# Patient Record
Sex: Male | Born: 1940 | Race: White | Hispanic: No | Marital: Married | Smoking: Never smoker
Health system: Southern US, Community
[De-identification: ages and names within clinical notes are randomized; demographics above are authoritative.]

## PROBLEM LIST (undated history)

## (undated) DIAGNOSIS — T8859XA Other complications of anesthesia, initial encounter: Secondary | ICD-10-CM

## (undated) DIAGNOSIS — R350 Frequency of micturition: Secondary | ICD-10-CM

## (undated) DIAGNOSIS — Z8719 Personal history of other diseases of the digestive system: Secondary | ICD-10-CM

## (undated) DIAGNOSIS — R112 Nausea with vomiting, unspecified: Secondary | ICD-10-CM

## (undated) DIAGNOSIS — K219 Gastro-esophageal reflux disease without esophagitis: Secondary | ICD-10-CM

## (undated) DIAGNOSIS — J45909 Unspecified asthma, uncomplicated: Secondary | ICD-10-CM

## (undated) DIAGNOSIS — C61 Malignant neoplasm of prostate: Secondary | ICD-10-CM

## (undated) DIAGNOSIS — T4145XA Adverse effect of unspecified anesthetic, initial encounter: Secondary | ICD-10-CM

## (undated) DIAGNOSIS — K649 Unspecified hemorrhoids: Secondary | ICD-10-CM

## (undated) DIAGNOSIS — K4090 Unilateral inguinal hernia, without obstruction or gangrene, not specified as recurrent: Secondary | ICD-10-CM

## (undated) DIAGNOSIS — Z8711 Personal history of peptic ulcer disease: Secondary | ICD-10-CM

## (undated) DIAGNOSIS — Z87442 Personal history of urinary calculi: Secondary | ICD-10-CM

## (undated) DIAGNOSIS — Z85828 Personal history of other malignant neoplasm of skin: Secondary | ICD-10-CM

## (undated) DIAGNOSIS — E119 Type 2 diabetes mellitus without complications: Secondary | ICD-10-CM

## (undated) DIAGNOSIS — D649 Anemia, unspecified: Secondary | ICD-10-CM

## (undated) DIAGNOSIS — Z85528 Personal history of other malignant neoplasm of kidney: Secondary | ICD-10-CM

## (undated) DIAGNOSIS — I10 Essential (primary) hypertension: Secondary | ICD-10-CM

## (undated) DIAGNOSIS — E785 Hyperlipidemia, unspecified: Secondary | ICD-10-CM

## (undated) DIAGNOSIS — Z9289 Personal history of other medical treatment: Secondary | ICD-10-CM

## (undated) DIAGNOSIS — Z85038 Personal history of other malignant neoplasm of large intestine: Secondary | ICD-10-CM

## (undated) DIAGNOSIS — R011 Cardiac murmur, unspecified: Secondary | ICD-10-CM

## (undated) DIAGNOSIS — Z9889 Other specified postprocedural states: Secondary | ICD-10-CM

## (undated) DIAGNOSIS — I6529 Occlusion and stenosis of unspecified carotid artery: Secondary | ICD-10-CM

## (undated) HISTORY — DX: Other specified postprocedural states: Z98.890

## (undated) HISTORY — PX: TONSILLECTOMY: SUR1361

## (undated) HISTORY — DX: Occlusion and stenosis of unspecified carotid artery: I65.29

## (undated) HISTORY — DX: Unilateral inguinal hernia, without obstruction or gangrene, not specified as recurrent: K40.90

## (undated) HISTORY — PX: CYSTOSCOPY: SUR368

## (undated) HISTORY — DX: Personal history of other diseases of the digestive system: Z87.19

## (undated) HISTORY — PX: THROAT SURGERY: SHX803

---

## 2003-08-28 HISTORY — PX: PARTIAL COLECTOMY: SHX5273

## 2004-02-09 ENCOUNTER — Encounter (INDEPENDENT_AMBULATORY_CARE_PROVIDER_SITE_OTHER): Payer: Self-pay | Admitting: Specialist

## 2004-02-09 ENCOUNTER — Ambulatory Visit (HOSPITAL_COMMUNITY): Admission: RE | Admit: 2004-02-09 | Discharge: 2004-02-09 | Payer: Self-pay | Admitting: Gastroenterology

## 2004-05-17 ENCOUNTER — Encounter (INDEPENDENT_AMBULATORY_CARE_PROVIDER_SITE_OTHER): Payer: Self-pay | Admitting: *Deleted

## 2004-05-17 ENCOUNTER — Inpatient Hospital Stay (HOSPITAL_COMMUNITY): Admission: RE | Admit: 2004-05-17 | Discharge: 2004-05-24 | Payer: Self-pay | Admitting: Surgery

## 2004-06-02 ENCOUNTER — Encounter: Admission: RE | Admit: 2004-06-02 | Discharge: 2004-06-02 | Payer: Self-pay | Admitting: Surgery

## 2005-06-08 ENCOUNTER — Encounter: Admission: RE | Admit: 2005-06-08 | Discharge: 2005-06-08 | Payer: Self-pay | Admitting: Unknown Physician Specialty

## 2005-06-22 ENCOUNTER — Encounter: Admission: RE | Admit: 2005-06-22 | Discharge: 2005-06-22 | Payer: Self-pay | Admitting: Unknown Physician Specialty

## 2006-01-03 IMAGING — CR DG ABDOMEN 2V
2 series · 2 of 2 positions shown · non-contrast
Comparison: none

CLINICAL DATA: Follow-up hemicolectomy.
 ABDOMEN TWO VIEWS
 The bowel gas pattern is normal.  There is no evidence for free peritoneal air.  Skin staples are seen in the transverse orientation overlying the right abdomen and there are surgical staple lines seen within the right abdomen.
 IMPRESSION
 Normal bowel gas pattern.

[view not recorded (1 of 2)]
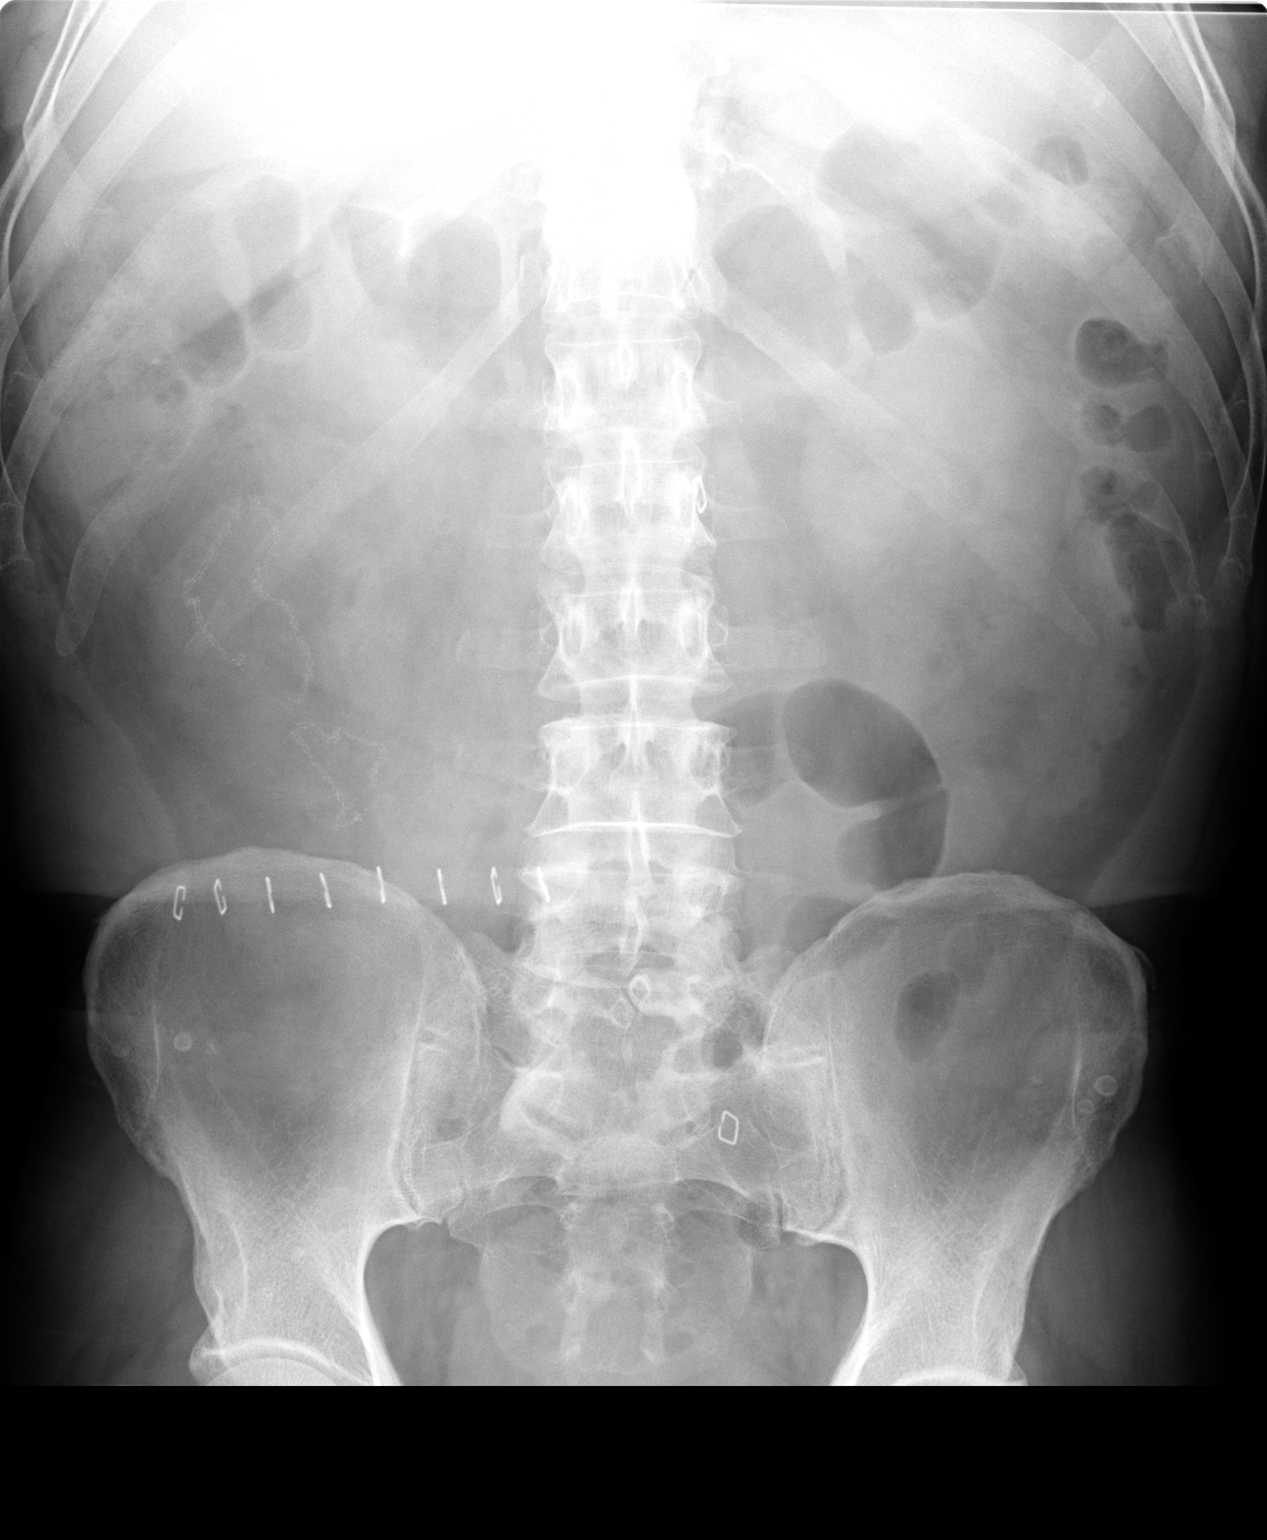

[view not recorded (2 of 2)]
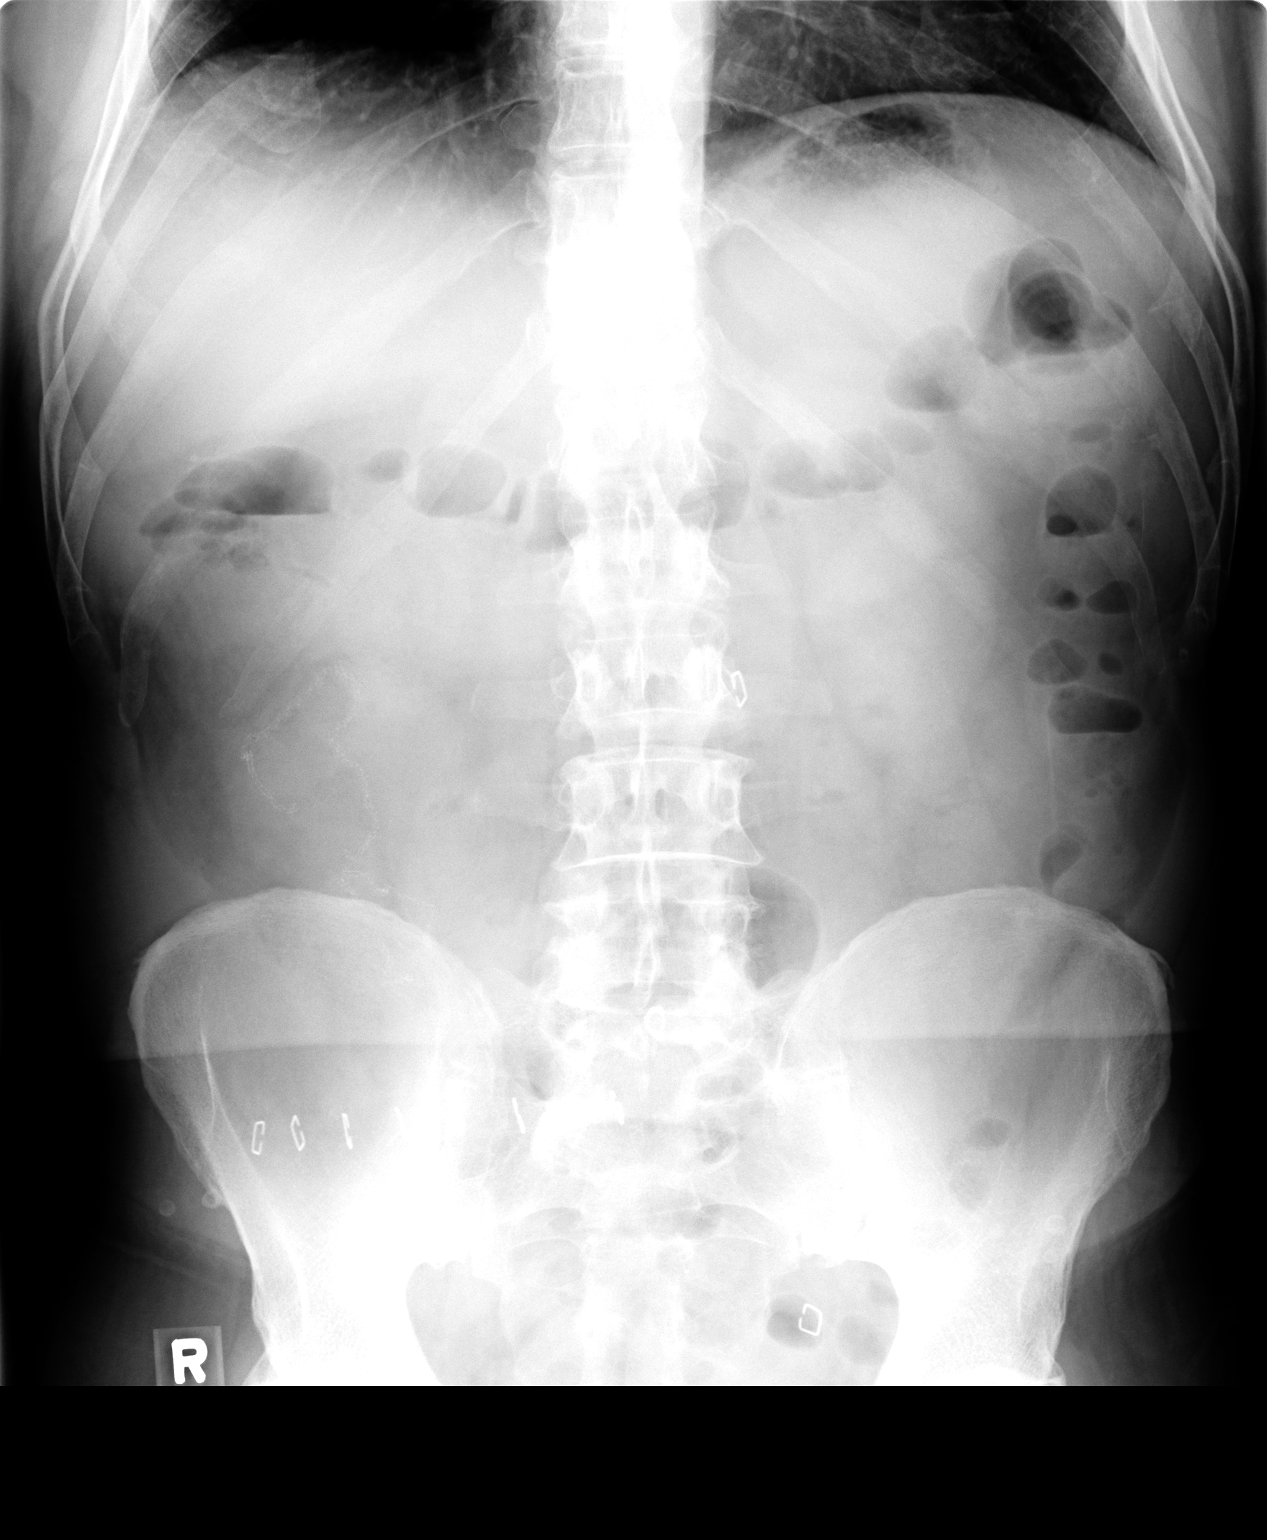

[2 of 2 positions shown; findings below may reference images not displayed]

## 2007-08-28 HISTORY — PX: OTHER SURGICAL HISTORY: SHX169

## 2009-09-19 ENCOUNTER — Inpatient Hospital Stay (HOSPITAL_COMMUNITY): Admission: AD | Admit: 2009-09-19 | Discharge: 2009-09-20 | Payer: Self-pay | Admitting: Gastroenterology

## 2010-08-27 HISTORY — PX: KNEE SURGERY: SHX244

## 2010-09-16 ENCOUNTER — Encounter: Payer: Self-pay | Admitting: Unknown Physician Specialty

## 2010-11-12 LAB — TYPE AND SCREEN
ABO/RH(D): O POS
Antibody Screen: NEGATIVE

## 2010-11-12 LAB — COMPREHENSIVE METABOLIC PANEL
ALT: 28 U/L (ref 0–53)
AST: 30 U/L (ref 0–37)
Albumin: 4 g/dL (ref 3.5–5.2)
Alkaline Phosphatase: 53 U/L (ref 39–117)
BUN: 19 mg/dL (ref 6–23)
CO2: 29 mEq/L (ref 19–32)
Calcium: 9.2 mg/dL (ref 8.4–10.5)
Chloride: 103 mEq/L (ref 96–112)
Creatinine, Ser: 0.74 mg/dL (ref 0.4–1.5)
GFR calc Af Amer: >60 mL/min (ref >60)
GFR calc non Af Amer: >60 mL/min (ref >60)
Glucose, Bld: 91 mg/dL (ref 70–99)
Potassium: 3.8 mEq/L (ref 3.5–5.1)
Sodium: 139 mEq/L (ref 135–145)
Total Bilirubin: 1.2 mg/dL (ref 0.3–1.2)
Total Protein: 6.1 g/dL (ref 6.0–8.3)

## 2010-11-12 LAB — CBC
HCT: 37.6 % — ABNORMAL LOW (ref 39.0–52.0)
HCT: 41.6 % (ref 39.0–52.0)
Hemoglobin: 12.5 g/dL — ABNORMAL LOW (ref 13.0–17.0)
Hemoglobin: 14.1 g/dL (ref 13.0–17.0)
MCHC: 33.2 g/dL (ref 30.0–36.0)
MCHC: 34 g/dL (ref 30.0–36.0)
MCV: 85.1 fL (ref 78.0–100.0)
MCV: 86.4 fL (ref 78.0–100.0)
Platelets: 151 10*3/uL (ref 150–400)
Platelets: 176 10*3/uL (ref 150–400)
RBC: 4.35 MIL/uL (ref 4.22–5.81)
RBC: 4.89 MIL/uL (ref 4.22–5.81)
RDW: 13.2 % (ref 11.5–15.5)
RDW: 13.4 % (ref 11.5–15.5)
WBC: 6.2 10*3/uL (ref 4.0–10.5)
WBC: 7.7 10*3/uL (ref 4.0–10.5)

## 2010-11-12 LAB — DIFFERENTIAL
Basophils Absolute: 0 10*3/uL (ref 0.0–0.1)
Basophils Relative: 1 % (ref 0–1)
Eosinophils Absolute: 0.4 10*3/uL (ref 0.0–0.7)
Eosinophils Relative: 5 % (ref 0–5)
Lymphocytes Relative: 25 % (ref 12–46)
Lymphs Abs: 1.9 10*3/uL (ref 0.7–4.0)
Monocytes Absolute: 0.7 10*3/uL (ref 0.1–1.0)
Monocytes Relative: 10 % (ref 3–12)
Neutro Abs: 4.5 10*3/uL (ref 1.7–7.7)
Neutrophils Relative %: 59 % (ref 43–77)

## 2010-11-12 LAB — PROTIME-INR
INR: 0.96 (ref 0.00–1.49)
Prothrombin Time: 12.7 seconds (ref 11.6–15.2)

## 2010-11-12 LAB — BASIC METABOLIC PANEL
BUN: 19 mg/dL (ref 6–23)
CO2: 28 mEq/L (ref 19–32)
Calcium: 8.4 mg/dL (ref 8.4–10.5)
Chloride: 106 mEq/L (ref 96–112)
Creatinine, Ser: 0.94 mg/dL (ref 0.4–1.5)
GFR calc Af Amer: >60 mL/min (ref >60)
GFR calc non Af Amer: >60 mL/min (ref >60)
Glucose, Bld: 114 mg/dL — ABNORMAL HIGH (ref 70–99)
Potassium: 3.8 mEq/L (ref 3.5–5.1)
Sodium: 139 mEq/L (ref 135–145)

## 2010-11-12 LAB — CLOTEST (H. PYLORI), BIOPSY: Helicobacter screen: POSITIVE — AB

## 2010-11-12 LAB — ABO/RH: ABO/RH(D): O POS

## 2011-01-12 NOTE — Op Note (Signed)
NAME:  Bruce Nunez, Bruce Nunez                           ACCOUNT NO.:  [id number]   MEDICAL RECORD NO.:  [id number]                   PATIENT TYPE:  AMB   LOCATION:  ENDO                                 FACILITY:  [facility]   PHYSICIAN:  Bernette Redbird, M.D.                DATE OF BIRTH:  10-Feb-1941   DATE OF PROCEDURE:  02/09/2004  DATE OF DISCHARGE:                                 OPERATIVE REPORT   PROCEDURE:  Colonoscopy with polypectomy and biopsy.   INDICATIONS FOR PROCEDURE:  A 70 year old for screening.  Flexible  sigmoidoscopy negative five years ago.  No worrisome risk factors or  symptoms.   FINDINGS:  Multiple polyps removed.  Medium size lesion attached to lip of  ileocecal valve, biopsied, but not removed.   DESCRIPTION OF PROCEDURE:  The nature, purpose, and risks of the procedure  have been discussed with the patient who provided written consent.  Sedation  was fentanyl 50 mcg and Versed 5 mg IV without arrhythmias or desaturation.  Digital examination of the prostate was unremarkable.  The Olympus  adjustable tension pediatric video colonoscope was advanced to the cecum  without too much difficulty.  On the way in, I encountered multiple polyps,  the largest of them being a roughly 1.5 cm soft, fleshy, erythematous polyp  literally attached to and integrated with the inferior lip of the ileocecal  valve.  I did not actually intubate the terminal ileum.  This lesion was  soft and fleshy.  It did not have a discrete margin.  I obtained several  cold biopsies, but did not feel that it was amenable to colonoscopic  extrication due to its juxtaposition to the ileocecal valve.   In the proximal ascending colon were at least three additional polyps, two  of which were cold snared and one of which was cold biopsied.  In the rectum  were two additional polyps removed by cold biopsy and in the descending  colon was a small polyp removed by cold snare.  All of the polyps except the  one  applied to the ileocecal were on the order of 4 mm or so in size and all  could be suctioned through the scope, except the ones which were cold  biopsied which were more like 2 or 3 mm in size.   Apart from the polyp at the ileocecal valve, it is felt that all polyp  tissue was removed.  However, no cautery was used in any location.  The  patient tolerated the procedure well and there were no apparent  complications.  Retroflexion in the rectum and reinspection of the rectum  was otherwise unremarkable.  There was no evidence of frank cancer, colitis,  vascular malformations, or diverticulosis.   The patient tolerated the procedure well and there were no apparent  complications.   IMPRESSION:  1. Multiple colon polyps removed as described above (211.3).  2. Rectal  polyps removed as described above as well.   PLAN:  Await pathology results.  Most likely, the patient will need surgical  resection of the lesion at the ileocecal valve.                                               Bernette Redbird, M.D.    RB/MEDQ  D:  02/09/2004  T:  02/09/2004  Job:  [id number]   cc:   [name]  [address]  [place]  [place] [address]  Fax: [phone]

## 2011-01-12 NOTE — Op Note (Signed)
NAMEDAVIUS, GOUDEAU NO.:  [id number]   MEDICAL RECORD NO.:  [id number]          PATIENT TYPE:  INP   LOCATION:  0380                         FACILITY:  [facility]   PHYSICIAN:  Thornton Park. Daphine Deutscher, M.D.DATE OF BIRTH:  1940/10/14   DATE OF PROCEDURE:  05/17/2004  DATE OF DISCHARGE:                                 OPERATIVE REPORT   PREOPERATIVE DIAGNOSIS:  Villous adenoma of the ileocecal valve.   POSTOPERATIVE DIAGNOSIS:  Villous adenoma of the ileocecal valve, permanent  sections pending.   PROCEDURE:  Laparoscopic assisted right hemicolectomy.   SURGEON:  Thornton Park. Daphine Deutscher, M.D.   ASSISTANT:  Currie Paris, M.D.   ANESTHESIA:  General endotracheal anesthesia.   OPERATIVE FINDINGS:  1.  Right inguinal hernia diagnosed laparoscopically.  2.  Villous tumor at the ileocecal valve examined grossly and sent for      permanent sections.   DESCRIPTION OF PROCEDURE:  Mr. Petrucelli was taken to room 1 and given general  anesthesia.  He did receive Mefoxin as well as heparin and PAS hose preop.  The abdomen was prepped with Betadine and draped sterilely.  The abdomen was  entered first using an Optiview technique making a small 1 cm incision above  the umbilicus slightly to the right of midline and inserting the Optiview  without difficulty.  The abdomen was then insufflated and two 5 mm trocars  were placed to the left of midline, one inferiorly and one superiorly in the  abdomen.  Through these, I began mobilization of the right colon starting  down near the terminal ileum.  At that point, I did recognize a right  inguinal hernia.  No left inguinal hernia was seen.  I also looked at the  liver which looked normal in the stomach.  The Harmonic scalpel was used to  mobilize the right colon including the ileocecal region, the ascending  colon, and the hepatic flexure.  When this was complete, it appeared  hemostasis had been achieved, again, using the Harmonic.  I  then made about  a 9 cm incision incorporating the trocar site just above the umbilicus and  used the plastic ring to protect the wound and to retract.  The terminal  ileum and right colon were exteriorized.  The terminal ileum was divided  with the GIA and likewise, a suitable area probably in the proximal  transverse colon was divided and the mesentery was divided with a  combination of the Harmonic scalpel with clamps and 2-0 silk ties.  The  terminal ileum was then aligned along the colon and these were attached with  a suture.  Antimesenteric openings were made with the Harmonic and the 75  GIA was inserted.  Once these were aligned, they were fired after holding  compression and the combined defect was examined and looked good.  No  bleeding was noted.  The combined opening was closed from either end, first  placing silk sutures at either staple line edge, closing the inner layer  with 4-0 PDS and reinforcing this closure with interrupted 3-0 silks.  The  mesentery was closed with interrupted 3-0 and 2-0 silks and returned to the  abdomen.  We changed our gloves and removed the wound protector.  The area  was irrigated.  No active bleeding was noted.  The small bowel was run and  seemed to be in good alignment.   The wound was closed in layers, first with a #1 PDS in the posterior sheath  and then the anterior sheath was closed from laterally and medially and tied  in the middle with #1 PDS.  The wound was injected with 0.5% Marcaine, was  irrigated again, and closed with staples as were the two small 5 mm trocar  sites.  The patient tolerated the procedure well and was taken to the  recovery room in satisfactory condition.      MBM/MEDQ  D:  05/17/2004  T:  05/17/2004  Job:  [id number]   cc:   [name]  [address]  [place]  [place] [address]  Fax: [phone]   Bernette Redbird, M.D.  [address] [place]  [place], [place] [address]  Fax: [phone]

## 2011-01-12 NOTE — Discharge Summary (Signed)
[name], [name] NO.:  [id number]   MEDICAL RECORD NO.:  [id number]          PATIENT TYPE:  INP   LOCATION:  0380                         FACILITY:  [facility]   PHYSICIAN:  Thornton Park. Daphine Deutscher, M.D.DATE OF BIRTH:  1940-11-03   DATE OF ADMISSION:  05/17/2004  DATE OF DISCHARGE:  05/24/2004                                 DISCHARGE SUMMARY   PROCEDURE:  May 17, 2004, laparoscopically assisted right  hemicolectomy.   Pathology report showed this to be a villous adenoma, 2.6 cm tubulovillous  adenoma of the ileocecal valve with no evidence of high-grade dysplasia or  malignancy.   COURSE IN THE HOSPITAL:  Bruce Nunez underwent a laparoscopic-assisted right  hemicolectomy on May 17, 2004.  He did very well.  He had minimal pain  but had a colonic ileus, which kind of slowed his ultimate progression to a  regular diet.  Finally, by postop day #7, he was passing gas and having  bowel movements.  He was discharged with Tylox for pain and Phenergan if he  got nauseated.  His condition was good.  He was asked to return to the  office in three weeks.      MBM/MEDQ  D:  05/24/2004  T:  05/24/2004  Job:  [id number]   cc:   [name]  [address]  [place]  [place] [address]  Fax: [phone]   Bernette Redbird, M.D.  [address] [place]  [place], [place] [address]  Fax: [phone]

## 2011-10-10 DIAGNOSIS — K921 Melena: Secondary | ICD-10-CM | POA: Diagnosis not present

## 2011-10-12 DIAGNOSIS — L570 Actinic keratosis: Secondary | ICD-10-CM | POA: Diagnosis not present

## 2011-10-12 DIAGNOSIS — L821 Other seborrheic keratosis: Secondary | ICD-10-CM | POA: Diagnosis not present

## 2011-10-12 DIAGNOSIS — D1801 Hemangioma of skin and subcutaneous tissue: Secondary | ICD-10-CM | POA: Diagnosis not present

## 2011-10-17 DIAGNOSIS — K625 Hemorrhage of anus and rectum: Secondary | ICD-10-CM | POA: Diagnosis not present

## 2011-10-17 DIAGNOSIS — J45909 Unspecified asthma, uncomplicated: Secondary | ICD-10-CM | POA: Diagnosis not present

## 2011-10-17 DIAGNOSIS — E785 Hyperlipidemia, unspecified: Secondary | ICD-10-CM | POA: Diagnosis not present

## 2011-10-17 DIAGNOSIS — J45902 Unspecified asthma with status asthmaticus: Secondary | ICD-10-CM | POA: Diagnosis not present

## 2011-10-17 DIAGNOSIS — I10 Essential (primary) hypertension: Secondary | ICD-10-CM | POA: Diagnosis not present

## 2011-10-17 DIAGNOSIS — D500 Iron deficiency anemia secondary to blood loss (chronic): Secondary | ICD-10-CM | POA: Diagnosis not present

## 2011-11-12 DIAGNOSIS — D500 Iron deficiency anemia secondary to blood loss (chronic): Secondary | ICD-10-CM | POA: Diagnosis not present

## 2011-11-12 DIAGNOSIS — J180 Bronchopneumonia, unspecified organism: Secondary | ICD-10-CM | POA: Diagnosis not present

## 2011-12-04 ENCOUNTER — Other Ambulatory Visit: Payer: Self-pay | Admitting: Gastroenterology

## 2011-12-04 DIAGNOSIS — R195 Other fecal abnormalities: Secondary | ICD-10-CM | POA: Diagnosis not present

## 2011-12-04 DIAGNOSIS — D128 Benign neoplasm of rectum: Secondary | ICD-10-CM | POA: Diagnosis not present

## 2011-12-04 DIAGNOSIS — K5730 Diverticulosis of large intestine without perforation or abscess without bleeding: Secondary | ICD-10-CM | POA: Diagnosis not present

## 2011-12-04 DIAGNOSIS — D509 Iron deficiency anemia, unspecified: Secondary | ICD-10-CM | POA: Diagnosis not present

## 2011-12-04 DIAGNOSIS — D129 Benign neoplasm of anus and anal canal: Secondary | ICD-10-CM | POA: Diagnosis not present

## 2011-12-04 DIAGNOSIS — D126 Benign neoplasm of colon, unspecified: Secondary | ICD-10-CM | POA: Diagnosis not present

## 2011-12-04 DIAGNOSIS — D1330 Benign neoplasm of unspecified part of small intestine: Secondary | ICD-10-CM | POA: Diagnosis not present

## 2012-01-11 DIAGNOSIS — L981 Factitial dermatitis: Secondary | ICD-10-CM | POA: Diagnosis not present

## 2012-01-11 DIAGNOSIS — L820 Inflamed seborrheic keratosis: Secondary | ICD-10-CM | POA: Diagnosis not present

## 2012-01-11 DIAGNOSIS — L570 Actinic keratosis: Secondary | ICD-10-CM | POA: Diagnosis not present

## 2012-03-17 DIAGNOSIS — D509 Iron deficiency anemia, unspecified: Secondary | ICD-10-CM | POA: Diagnosis not present

## 2012-04-17 DIAGNOSIS — J209 Acute bronchitis, unspecified: Secondary | ICD-10-CM | POA: Diagnosis not present

## 2012-05-05 DIAGNOSIS — R079 Chest pain, unspecified: Secondary | ICD-10-CM | POA: Diagnosis not present

## 2012-05-05 DIAGNOSIS — S20219A Contusion of unspecified front wall of thorax, initial encounter: Secondary | ICD-10-CM | POA: Diagnosis not present

## 2012-05-12 DIAGNOSIS — E785 Hyperlipidemia, unspecified: Secondary | ICD-10-CM | POA: Diagnosis not present

## 2012-05-12 DIAGNOSIS — I10 Essential (primary) hypertension: Secondary | ICD-10-CM | POA: Diagnosis not present

## 2012-05-12 DIAGNOSIS — Z6826 Body mass index (BMI) 26.0-26.9, adult: Secondary | ICD-10-CM | POA: Diagnosis not present

## 2012-05-12 DIAGNOSIS — Z125 Encounter for screening for malignant neoplasm of prostate: Secondary | ICD-10-CM | POA: Diagnosis not present

## 2012-06-04 DIAGNOSIS — R972 Elevated prostate specific antigen [PSA]: Secondary | ICD-10-CM | POA: Diagnosis not present

## 2012-06-25 DIAGNOSIS — R972 Elevated prostate specific antigen [PSA]: Secondary | ICD-10-CM | POA: Diagnosis not present

## 2012-06-25 DIAGNOSIS — Z6831 Body mass index (BMI) 31.0-31.9, adult: Secondary | ICD-10-CM | POA: Diagnosis not present

## 2012-06-26 DIAGNOSIS — H43399 Other vitreous opacities, unspecified eye: Secondary | ICD-10-CM | POA: Diagnosis not present

## 2012-06-26 DIAGNOSIS — H179 Unspecified corneal scar and opacity: Secondary | ICD-10-CM | POA: Diagnosis not present

## 2012-06-26 DIAGNOSIS — H04129 Dry eye syndrome of unspecified lacrimal gland: Secondary | ICD-10-CM | POA: Diagnosis not present

## 2012-06-26 DIAGNOSIS — Z961 Presence of intraocular lens: Secondary | ICD-10-CM | POA: Diagnosis not present

## 2012-07-01 DIAGNOSIS — N200 Calculus of kidney: Secondary | ICD-10-CM | POA: Diagnosis not present

## 2012-07-01 DIAGNOSIS — R972 Elevated prostate specific antigen [PSA]: Secondary | ICD-10-CM | POA: Diagnosis not present

## 2012-07-01 DIAGNOSIS — N401 Enlarged prostate with lower urinary tract symptoms: Secondary | ICD-10-CM | POA: Diagnosis not present

## 2012-08-11 DIAGNOSIS — I10 Essential (primary) hypertension: Secondary | ICD-10-CM | POA: Diagnosis not present

## 2012-08-11 DIAGNOSIS — J45909 Unspecified asthma, uncomplicated: Secondary | ICD-10-CM | POA: Diagnosis not present

## 2012-08-11 DIAGNOSIS — E785 Hyperlipidemia, unspecified: Secondary | ICD-10-CM | POA: Diagnosis not present

## 2012-08-15 DIAGNOSIS — IMO0002 Reserved for concepts with insufficient information to code with codable children: Secondary | ICD-10-CM | POA: Diagnosis not present

## 2012-08-15 DIAGNOSIS — C61 Malignant neoplasm of prostate: Secondary | ICD-10-CM | POA: Insufficient documentation

## 2012-08-15 DIAGNOSIS — R972 Elevated prostate specific antigen [PSA]: Secondary | ICD-10-CM | POA: Diagnosis not present

## 2012-08-15 HISTORY — DX: Malignant neoplasm of prostate: C61

## 2012-08-15 HISTORY — PX: PROSTATE BIOPSY: SHX241

## 2012-10-08 DIAGNOSIS — J0100 Acute maxillary sinusitis, unspecified: Secondary | ICD-10-CM | POA: Diagnosis not present

## 2012-10-14 ENCOUNTER — Encounter: Payer: Self-pay | Admitting: Radiation Oncology

## 2012-10-15 ENCOUNTER — Ambulatory Visit
Admission: RE | Admit: 2012-10-15 | Discharge: 2012-10-15 | Disposition: A | Discharge status: Home / Self Care | Payer: Medicare Other | Source: Ambulatory Visit | Attending: Radiation Oncology | Admitting: Radiation Oncology

## 2012-10-15 ENCOUNTER — Encounter: Payer: Self-pay | Admitting: Radiation Oncology

## 2012-10-15 VITALS — BP 172/61 | HR 77 | Temp 98.3°F | Wt 168.1 lb

## 2012-10-15 DIAGNOSIS — E785 Hyperlipidemia, unspecified: Secondary | ICD-10-CM | POA: Insufficient documentation

## 2012-10-15 DIAGNOSIS — I10 Essential (primary) hypertension: Secondary | ICD-10-CM | POA: Insufficient documentation

## 2012-10-15 DIAGNOSIS — J45909 Unspecified asthma, uncomplicated: Secondary | ICD-10-CM | POA: Diagnosis not present

## 2012-10-15 DIAGNOSIS — C61 Malignant neoplasm of prostate: Secondary | ICD-10-CM | POA: Insufficient documentation

## 2012-10-15 DIAGNOSIS — Z79899 Other long term (current) drug therapy: Secondary | ICD-10-CM | POA: Insufficient documentation

## 2012-10-15 HISTORY — DX: Unspecified hemorrhoids: K64.9

## 2012-10-15 HISTORY — DX: Anemia, unspecified: D64.9

## 2012-10-15 HISTORY — DX: Unspecified asthma, uncomplicated: J45.909

## 2012-10-15 HISTORY — DX: Hyperlipidemia, unspecified: E78.5

## 2012-10-15 HISTORY — DX: Essential (primary) hypertension: I10

## 2012-10-15 HISTORY — DX: Malignant neoplasm of prostate: C61

## 2012-10-15 NOTE — Progress Notes (Signed)
Please see the Nurse Progress Note in the MD Initial Consult Encounter for this patient. 

## 2012-10-15 NOTE — Progress Notes (Signed)
   Complex simulation/treatment planning note: The patient underwent complex simulation/treatment planning in preparation of his prostate seed implant. He was taken to the CT simulator. His pelvis was scanned. I contoured his prostate and projected dose over the bony arch. His gland volume is 34.1 cc, whereas Dr. Isabel Caprice measured 30 cc. Prostatic length is 3.6 cm. The arch is open. He is a candidate for seed implantation. I prescribing 14,500 cGy utilizing I-125 seeds. He is to be implanted with [employer] system.

## 2012-10-15 NOTE — Progress Notes (Signed)
Patient and wife here for radiation consultation of newly diagnosed prostate cancer. PSA 5.0 on 06/26/12 PSA 4.6 on 06/05/12 Gleason 3+4-7 Prostate volume 30 g. (Will call with medication he had reaction to when he gets home)

## 2012-10-15 NOTE — Progress Notes (Signed)
[facility] Radiation Oncology NEW PATIENT EVALUATION  Name: Bruce Nunez MRN: [id number]  Date:   10/15/2012           DOB: 17-Mar-1941  Status: outpatient   CC:   Valetta Fuller, MD,  Dr. [name]   REFERRING PHYSICIAN: Valetta Fuller, MD   DIAGNOSIS: The encounter diagnosis was Prostate cancer.    HISTORY OF PRESENT ILLNESS:  Bruce Nunez is a 72 y.o. male who is seen today for the courtesy of Dr. Barron Alvine for consideration of radiation therapy in the management of his stage TI C. intermediate risk adenocarcinoma prostate. His then have an elevated PSA in early October 2013 of 4.6. After a course of antibiotics his PSA rose to 5.8. He was seen by Dr. Isabel Caprice who performed ultrasound-guided biopsies on 08/15/2012. His then have Gleason 7 (3+4) involving 10% of one core from the right base and Gleason 6 (3+3) involving 40% of one core from right lateral mid gland, 30% of one core from the right mid gland, 70% of one core from the right lateral apex and 50% of one core from the right apex. His gland volume was approximately 30 cc. He is doing well from a GU and GI standpoint. His I PSS score is 5. He claims to be potent, but not sexually active.  PREVIOUS RADIATION THERAPY: No   PAST MEDICAL HISTORY:  has a past medical history of Prostate cancer (08/15/12); Hypertension; Asthma; Reflux esophagitis; Hemorrhoids; Hyperlipidemia; Anemia; and Kidney stones.     PAST SURGICAL HISTORY:  Past Surgical History  Procedure Laterality Date  . Prostate biopsy  08/15/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  . Cataract surgery  2009  . Knee surgery  2012    right  . Partial colectomy  2005  . Throat surgery      as child (age 31)  . Tonsillectomy      age 38  . Cystoscopy       FAMILY HISTORY: family history includes Brain cancer in an unspecified family member; Coronary artery disease in an unspecified family member; Heart attack in an unspecified family member; and Prostate cancer in an  unspecified family member. His father died from congestive heart failure at 50. He was diagnosed with prostate cancer at age 5. His mother died of a brain malignancy at age 53. He had 2 first cousins with prostate cancer.   SOCIAL HISTORY:  reports that he has never smoked. He has never used smokeless tobacco. He reports that he does not drink alcohol or use illicit drugs. Married, 2 children. Retired from [employer] for a [employer]   ALLERGIES: Aspirin; Penicillins; Sulfa antibiotics; and Tramadol   MEDICATIONS:  Current Outpatient Prescriptions  Medication Sig Dispense Refill  . albuterol (PROVENTIL HFA;VENTOLIN HFA) 108 (90 BASE) MCG/ACT inhaler Inhale 2 puffs into the lungs every 6 (six) hours as needed for wheezing.      Marland Kitchen amLODipine (NORVASC) 10 MG tablet Take 10 mg by mouth daily.      . bisoprolol-hydrochlorothiazide (ZIAC) 10-6.25 MG per tablet Take 1 tablet by mouth daily.      . ceftibuten (CEDAX) 400 MG capsule Take 400 mg by mouth daily. Anti-biotic for sinus infection      . fexofenadine (ALLEGRA) 180 MG tablet Take 180 mg by mouth daily.      . fluticasone-salmeterol (ADVAIR HFA) 115-21 MCG/ACT inhaler Inhale 2 puffs into the lungs 2 (two) times daily.      Marland Kitchen  hydrocortisone (ANUSOL-HC) 25 MG suppository Place 25 mg rectally as needed for hemorrhoids.      Marland Kitchen losartan (COZAAR) 100 MG tablet Take 100 mg by mouth daily.      . mometasone (NASONEX) 50 MCG/ACT nasal spray Place 2 sprays into the nose as needed.      . Multiple Vitamin (MULTIVITAMIN) tablet Take 1 tablet by mouth daily.      . ondansetron (ZOFRAN) 4 MG tablet Take 4 mg by mouth every 8 (eight) hours as needed for nausea.      . Tamsulosin HCl (FLOMAX) 0.4 MG CAPS Take 0.4 mg by mouth daily.      . ferrous sulfate 325 (65 FE) MG tablet Take 325 mg by mouth daily with breakfast.       No current facility-administered medications for this encounter.     REVIEW OF SYSTEMS:  Pertinent items are noted in  HPI.    PHYSICAL EXAM:  weight is 168 lb 1.6 oz (76.25 kg). His temperature is 98.3 F (36.8 C). His blood pressure is 172/61 and his pulse is 77.   Alert and oriented. Head and neck examination: Grossly unremarkable. Nodes: Without palpable cervical or supraclavicular lymphadenopathy. Chest: Lungs clear. Heart: Regular in rhythm. Back: Without spinal or CVA tenderness. Abdomen: Right mid abdominal scar from previous colectomy. Without masses organomegaly. Genitalia unremarkable to inspection. Rectal: The prostate gland is normal in size with a suggestion of induration along the right mid gland. No nodularity. Extremities without edema. Neurologic examination: Grossly nonfocal.   LABORATORY DATA:  Lab Results  Component Value Date   WBC 6.2 09/20/2009   HGB 12.5* 09/20/2009   HCT 37.6* 09/20/2009   MCV 86.4 09/20/2009   PLT 151 09/20/2009   Lab Results  Component Value Date   NA 139 09/20/2009   K 3.8 09/20/2009   CL 106 09/20/2009   CO2 28 09/20/2009   Lab Results  Component Value Date   ALT 28 09/19/2009   AST 30 09/19/2009   ALKPHOS 53 09/19/2009   BILITOT 1.2 09/19/2009   PSA 5.8 from 06/25/2012.   IMPRESSION: Stage TI C. intermediate risk adenocarcinoma prostate. I explained to the patient and his wife that his prognosis is related to his stage, PSA level, and Gleason score. His stage and PSA level are favorable while his Gleason score 7 is of intermediate favorability. We discussed surgery versus close surveillance versus radiation therapy. His radiation therapy options include seed implantation alone or 8 weeks of external beam/IMRT. Since he only has low volume Gleason 7 there is no need for 5 weeks of external beam radiation therapy prior to seed implantation. We discussed the potential acute and late toxicities of radiation therapy. After a lengthy discussion he is most interested in seed implantation which I think would be a good choice for him. He was given literature for review. We  will perform a CT arch study today and then get him scheduled for seed implantation with Dr. Isabel Caprice. Consent is signed today.   PLAN: As discussed above. I expect that we can get him scheduled for seed implantation in approximately 4-6 weeks. He'll have a CT arch study later today.  I spent 60 minutes minutes face to face with the patient and more than 50% of that time was spent in counseling and/or coordination of care.

## 2012-10-16 NOTE — Addendum Note (Signed)
Encounter addended by: Tessa Lerner, RN on: 10/16/2012  9:48 AM<BR>     Documentation filed: Charges VN

## 2012-10-22 ENCOUNTER — Telehealth: Payer: Self-pay | Admitting: *Deleted

## 2012-10-22 ENCOUNTER — Other Ambulatory Visit: Payer: Self-pay | Admitting: Urology

## 2012-10-22 NOTE — Telephone Encounter (Signed)
CALLED PATIENT TO INFORM OF IMPLANT DATE, LVM FOR A RETURN CALL 

## 2012-10-23 ENCOUNTER — Ambulatory Visit (HOSPITAL_BASED_OUTPATIENT_CLINIC_OR_DEPARTMENT_OTHER)
Admission: RE | Admit: 2012-10-23 | Discharge: 2012-10-23 | Disposition: A | Discharge status: Home / Self Care | Payer: Medicare Other | Source: Ambulatory Visit | Attending: Urology | Admitting: Urology

## 2012-10-23 ENCOUNTER — Encounter (HOSPITAL_BASED_OUTPATIENT_CLINIC_OR_DEPARTMENT_OTHER)
Admission: RE | Admit: 2012-10-23 | Discharge: 2012-10-23 | Disposition: A | Discharge status: Home / Self Care | Payer: Medicare Other | Source: Ambulatory Visit | Attending: Urology | Admitting: Urology

## 2012-10-23 DIAGNOSIS — R9431 Abnormal electrocardiogram [ECG] [EKG]: Secondary | ICD-10-CM | POA: Diagnosis not present

## 2012-10-23 DIAGNOSIS — I10 Essential (primary) hypertension: Secondary | ICD-10-CM | POA: Diagnosis not present

## 2012-10-23 DIAGNOSIS — Z87891 Personal history of nicotine dependence: Secondary | ICD-10-CM | POA: Diagnosis not present

## 2012-10-23 DIAGNOSIS — Z01818 Encounter for other preprocedural examination: Secondary | ICD-10-CM | POA: Diagnosis not present

## 2012-11-10 DIAGNOSIS — D649 Anemia, unspecified: Secondary | ICD-10-CM | POA: Diagnosis not present

## 2012-11-10 DIAGNOSIS — E785 Hyperlipidemia, unspecified: Secondary | ICD-10-CM | POA: Diagnosis not present

## 2012-11-10 DIAGNOSIS — C61 Malignant neoplasm of prostate: Secondary | ICD-10-CM | POA: Diagnosis not present

## 2012-11-10 DIAGNOSIS — I10 Essential (primary) hypertension: Secondary | ICD-10-CM | POA: Diagnosis not present

## 2012-11-14 ENCOUNTER — Telehealth: Payer: Self-pay | Admitting: *Deleted

## 2012-11-14 NOTE — Telephone Encounter (Signed)
Called patient to remind of appt., lvm for a return call 

## 2012-11-17 DIAGNOSIS — Z886 Allergy status to analgesic agent status: Secondary | ICD-10-CM | POA: Diagnosis not present

## 2012-11-17 DIAGNOSIS — J45909 Unspecified asthma, uncomplicated: Secondary | ICD-10-CM | POA: Diagnosis not present

## 2012-11-17 DIAGNOSIS — Z882 Allergy status to sulfonamides status: Secondary | ICD-10-CM | POA: Diagnosis not present

## 2012-11-17 DIAGNOSIS — J309 Allergic rhinitis, unspecified: Secondary | ICD-10-CM | POA: Diagnosis not present

## 2012-11-17 DIAGNOSIS — K219 Gastro-esophageal reflux disease without esophagitis: Secondary | ICD-10-CM | POA: Diagnosis not present

## 2012-11-17 DIAGNOSIS — D509 Iron deficiency anemia, unspecified: Secondary | ICD-10-CM | POA: Diagnosis not present

## 2012-11-17 DIAGNOSIS — Z880 Allergy status to penicillin: Secondary | ICD-10-CM | POA: Diagnosis not present

## 2012-11-17 DIAGNOSIS — C61 Malignant neoplasm of prostate: Secondary | ICD-10-CM | POA: Diagnosis not present

## 2012-11-17 DIAGNOSIS — I10 Essential (primary) hypertension: Secondary | ICD-10-CM | POA: Diagnosis not present

## 2012-11-17 DIAGNOSIS — I209 Angina pectoris, unspecified: Secondary | ICD-10-CM | POA: Diagnosis not present

## 2012-11-17 DIAGNOSIS — IMO0002 Reserved for concepts with insufficient information to code with codable children: Secondary | ICD-10-CM | POA: Diagnosis not present

## 2012-11-17 DIAGNOSIS — E785 Hyperlipidemia, unspecified: Secondary | ICD-10-CM | POA: Diagnosis not present

## 2012-11-17 DIAGNOSIS — Z8701 Personal history of pneumonia (recurrent): Secondary | ICD-10-CM | POA: Diagnosis not present

## 2012-11-17 DIAGNOSIS — Z79899 Other long term (current) drug therapy: Secondary | ICD-10-CM | POA: Diagnosis not present

## 2012-11-17 LAB — COMPREHENSIVE METABOLIC PANEL
Alkaline Phosphatase: 60 U/L (ref 39–117)
BUN: 15 mg/dL (ref 6–23)
Chloride: 102 mEq/L (ref 96–112)
Creatinine, Ser: 0.9 mg/dL (ref 0.50–1.35)
GFR calc Af Amer: >90 mL/min (ref >90)
Glucose, Bld: 117 mg/dL — ABNORMAL HIGH (ref 70–99)
Potassium: 4.4 mEq/L (ref 3.5–5.1)
Total Bilirubin: 0.9 mg/dL (ref 0.3–1.2)

## 2012-11-17 LAB — PROTIME-INR
INR: 0.9 (ref 0.00–1.49)
Prothrombin Time: 12.1 seconds (ref 11.6–15.2)

## 2012-11-17 LAB — CBC
HCT: 45 % (ref 39.0–52.0)
Hemoglobin: 15.3 g/dL (ref 13.0–17.0)
MCHC: 34 g/dL (ref 30.0–36.0)
MCV: 84.1 fL (ref 78.0–100.0)
RDW: 12.7 % (ref 11.5–15.5)
WBC: 6.8 10*3/uL (ref 4.0–10.5)

## 2012-11-19 ENCOUNTER — Encounter (HOSPITAL_BASED_OUTPATIENT_CLINIC_OR_DEPARTMENT_OTHER): Payer: Self-pay | Admitting: *Deleted

## 2012-11-19 NOTE — Progress Notes (Signed)
To [facility] at  0845- Npo after Mn-will take losartan,amlodipine with small amt water that am-instructed to complete fleets enema prior to arrival that am.labs,Ekg,CXR w/chart.

## 2012-11-21 ENCOUNTER — Telehealth: Payer: Self-pay | Admitting: *Deleted

## 2012-11-21 DIAGNOSIS — C61 Malignant neoplasm of prostate: Secondary | ICD-10-CM | POA: Diagnosis not present

## 2012-11-21 NOTE — Telephone Encounter (Signed)
Called patient to remind of procedure for Monday March 31 , spoke with patient and he is aware this procedure.

## 2012-11-21 NOTE — H&P (Signed)
Reason For Visit  Here today to undergo prostate seed implantation for management of intermediate risk prostate cancer.  History of Present Illness            Bruce Nunez's  PSA was mildly elevated at 4.6. There was mild induration of the right lobe of his prostate along with a family history of prostate cancer. Ultrasound revealed a prostate of just over 30 g. All left-sided biopsies were negative. On the right the patient did have 5/6 biopsies positive. 4 of the biopsies showed Gleason's 3+3 equals 6 adenocarcinoma with one biopsy showing Gleason 7 disease. Patient does have mild to moderate voiding symptoms.   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    IPSS  6     SHIM 23/25.   PMH includes: Asthma/ HTN    Past Medical History Problems  1. History of  Allergic Rhinitis 477.9 2. History of  Anal Hemorrhage 569.3 3. History of  Angina Pectoris 413.9 4. History of  Asthma 493.90 5. History of  Asthmatic Bronchitis With Status Asthmaticus 493.91 6. History of  Benign Essential Hypertension 401.1 7. History of  Bronchopneumonia 485 8. 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 History of  Edema 782.3 10. History of  Hemorrhoids 455.6 11. History of  Hyperlipidemia 272.4 12. History of  Iron Deficiency 280.9 13. 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 History of  Cataract Surgery 2. History of  Knee Surgery 3. History of  Partial Colectomy 4. History of  Throat Surgery 5. History of  Tonsillectomy  Current Meds 1. Advair HFA 115-21 MCG/ACT Inhalation Aerosol; Therapy: (Recorded:05Nov2013) to 2. Allegra 180 MG TABS; Therapy: (Recorded:05Nov2013) to 3. AmLODIPine Besylate 10 MG Oral Tablet; Therapy: (Recorded:05Nov2013) to 4. Anusol-HC 25 MG Rectal Suppository; Therapy: (Recorded:05Nov2013) to 5. Cozaar 100 MG Oral Tablet; Therapy:  (Recorded:05Nov2013) to 6. Ferrous Sulfate 325 (65 Fe) MG Oral Tablet; Therapy: (Recorded:05Nov2013) to 7. Hydrocodone-Acetaminophen 5-325 MG Oral Tablet; Therapy: (Recorded:05Nov2013) to 8. Levofloxacin 500 MG Oral Tablet; TAKE 1 TABLET Daily Start taking the day prior to procedure  date; Therapy: 05Nov2013 to (Evaluate:08Nov2013); Last Rx:05Nov2013 9. Multi-Day TABS; Therapy: (Recorded:05Nov2013) to 10. Nasonex 50 MCG/ACT Nasal Suspension; Therapy: (Recorded:05Nov2013) to 11. Omeprazole 20 MG Oral Capsule Delayed Release; Therapy: (Recorded:05Nov2013) to 12. Proventil HFA 108 (90 Base) MCG/ACT Inhalation Aerosol Solution; Therapy:   (Recorded:05Nov2013) to 13. Vitamin B12 TABS; Therapy: (Recorded:05Nov2013) to 14. Ziac TABS; Therapy: (Recorded:05Nov2013) to 15. Zofran TABS; Therapy: (Recorded:05Nov2013) to  Allergies Medication  1. Aspirin Low Dose TABS 2. Tramadol 3. Penicillins 4. Sulfa Drugs  Family History Problems  1. Paternal history of  Acute Myocardial Infarction V17.3 2. Maternal history of  Brain Cancer V16.8 3. Paternal history of  Coronary Artery Disease V17.49 4. Paternal history of  Death In The Family Father 70yr, MI 5. Maternal history of  Death In The Family Mother @ 33yr, of a brain tumor 6. Paternal history of  Diabetes Mellitus V18.0 7. Family history of  Family Health Status Number Of Children 2 sons 8. Paternal history of  Heart Disease V17.49 9. Paternal history of  Prostate Cancer V16.42  Social History Problems  1. Caffeine Use 3 qd 2. Marital History - Currently Married 3. Never A Smoker 4. Occupation: retired Nurse, children's  5. History of  Alcohol Use 6. History of  Tobacco Use  Review of Systems Genitourinary, constitutional, skin, eye,  otolaryngeal, hematologic/lymphatic, cardiovascular, pulmonary, endocrine, musculoskeletal, gastrointestinal, neurological and psychiatric system(s) were reviewed and pertinent findings if present are noted.   Genitourinary: feelings of urinary urgency and nocturia.   Physical Exam Constitutional: Well nourished and well developed . No acute distress.  ENT:. The ears and nose are normal in appearance.  Neck: The appearance of the neck is normal and no neck mass is present.  Pulmonary: No respiratory distress and normal respiratory rhythm and effort.  Cardiovascular: Heart rate and rhythm are normal . No peripheral edema.  Abdomen: The abdomen is soft and nontender. No masses are palpated. No CVA tenderness. No hernias are palpable. No hepatosplenomegaly noted.  Rectal: Rectal exam demonstrates normal sphincter tone, no tenderness and no masses. Estimated prostate size is 1+. The prostate has no nodularity, is indurated involving the right, mid aspect of the prostate which appears to be confined within the prostate capsule and is not tender. The left seminal vesicle is nonpalpable. The right seminal vesicle is nonpalpable. The perineum is normal on inspection.  Genitourinary: Examination of the penis demonstrates no discharge, no masses, no lesions and a normal meatus. The scrotum is without lesions. The right epididymis is palpably normal and non-tender. The left epididymis is palpably normal and non-tender. The right testis is non-tender and without masses. The left testis is non-tender and without masses.  Lymphatics: The femoral and inguinal nodes are not enlarged or tender.  Skin: Normal skin turgor, no visible rash and no visible skin lesions.  Neuro/Psych:. Mood and affect are appropriate.     Assessment Assessed  1. Prostate Cancer 185  Plan Prostate Cancer (185)  1. Tamsulosin HCl 0.4 MG Oral Capsule; TAKE 1 CAPSULE Daily; Therapy: 04Feb2014 to  (Evaluate:02Sep2014); Last Rx:04Feb2014  Discussion/Summary  The patient was counseled about the natural history of prostate cancer and the standard treatment options that are available for prostate cancer. It was explained to him how his age  and life expectancy, clinical stage, Gleason score, and PSA affect his prognosis, the decision to proceed with additional staging studies, as well as how that information influences recommended treatment strategies. We discussed the roles for active surveillance, radiation therapy, surgical therapy, androgen deprivation, as well as ablative therapy options for the treatment of prostate cancer as appropriate to his individual cancer situation. We discussed the risks and benefits of these options with regard to their impact on cancer control and also in terms of potential adverse events, complications, and impact on quiality of life particularly related to urinary, bowel, and sexual function. The patient was encouraged to ask questions throughout the discussion today and all questions were answered to his stated satisfaction. In addition, the patient was provided with and/or directed to appropriate resources and literature for further education about prostate cancer and treatment options.   We discussed surgical therapy for prostate cancer including the different available surgical approaches. We discussed, in detail, the risks and expectations of surgery with regard to cancer control, urinary control, and erectile function as well as the expected postoperative recovery process. The risks, potential complications/adverse events of radical prostatectomy as well as alternative options were explained to the patient.   45 minutes were spent in face to face consultation with patient today.    Amendment  Jung really has, again, a clinical stage T2b tumor that I would consider intermediate risk. He did have larger volume disease on the left side, but again, the majority of the tumor was Gleason 6. He is 72 years of age but enjoys  excellent health. I do think he logistically would be an excellent candidate for seed implantation. He lives in [place], and therefore external beam radiation therapy would be more  difficult. He does have 1 core of Gleason 7, but it is low volume. He has a PSA well less than 10 and a small gland with minimal voiding symptoms with an IPSS score in the 6-8 range. All these things would, again, point to excellent candidacy for seed implantation. He could conceivably have robotic prostatectomy but has had a hemicolectomy and is likely to have substantial adhesions. In his age group there would be a higher incidence of permanent incontinence and certainly more difficult to treat erectile dysfunction. I do think that treatment would be over aggressive in his situation. I recommended that he see Dr. Chipper Herb, who he knows of. He is in agreement with this and is interested in proceeding with seed implantation if Dr. Dayton Scrape concurs with our assessment. We will set up that consultation for sometime in the near future and look forward to hearing from Dr. Dayton Scrape.

## 2012-11-24 ENCOUNTER — Encounter (HOSPITAL_BASED_OUTPATIENT_CLINIC_OR_DEPARTMENT_OTHER): Payer: Self-pay | Admitting: Anesthesiology

## 2012-11-24 ENCOUNTER — Ambulatory Visit (HOSPITAL_BASED_OUTPATIENT_CLINIC_OR_DEPARTMENT_OTHER): Payer: Medicare Other | Admitting: Anesthesiology

## 2012-11-24 ENCOUNTER — Ambulatory Visit (HOSPITAL_COMMUNITY): Payer: Medicare Other

## 2012-11-24 ENCOUNTER — Encounter: Payer: Self-pay | Admitting: Radiation Oncology

## 2012-11-24 ENCOUNTER — Encounter (HOSPITAL_BASED_OUTPATIENT_CLINIC_OR_DEPARTMENT_OTHER)
Admission: RE | Disposition: A | Discharge status: Home / Self Care | Payer: Self-pay | Source: Ambulatory Visit | Attending: Urology

## 2012-11-24 ENCOUNTER — Ambulatory Visit (HOSPITAL_BASED_OUTPATIENT_CLINIC_OR_DEPARTMENT_OTHER)
Admission: RE | Admit: 2012-11-24 | Discharge: 2012-11-24 | Disposition: A | Discharge status: Home / Self Care | Payer: Medicare Other | Source: Ambulatory Visit | Attending: Urology | Admitting: Urology

## 2012-11-24 ENCOUNTER — Encounter (HOSPITAL_BASED_OUTPATIENT_CLINIC_OR_DEPARTMENT_OTHER): Payer: Self-pay | Admitting: *Deleted

## 2012-11-24 DIAGNOSIS — I209 Angina pectoris, unspecified: Secondary | ICD-10-CM | POA: Insufficient documentation

## 2012-11-24 DIAGNOSIS — Z886 Allergy status to analgesic agent status: Secondary | ICD-10-CM | POA: Insufficient documentation

## 2012-11-24 DIAGNOSIS — Z79899 Other long term (current) drug therapy: Secondary | ICD-10-CM | POA: Insufficient documentation

## 2012-11-24 DIAGNOSIS — D509 Iron deficiency anemia, unspecified: Secondary | ICD-10-CM | POA: Diagnosis not present

## 2012-11-24 DIAGNOSIS — I10 Essential (primary) hypertension: Secondary | ICD-10-CM | POA: Insufficient documentation

## 2012-11-24 DIAGNOSIS — J45909 Unspecified asthma, uncomplicated: Secondary | ICD-10-CM | POA: Insufficient documentation

## 2012-11-24 DIAGNOSIS — C61 Malignant neoplasm of prostate: Secondary | ICD-10-CM | POA: Diagnosis not present

## 2012-11-24 DIAGNOSIS — J309 Allergic rhinitis, unspecified: Secondary | ICD-10-CM | POA: Insufficient documentation

## 2012-11-24 DIAGNOSIS — Z882 Allergy status to sulfonamides status: Secondary | ICD-10-CM | POA: Insufficient documentation

## 2012-11-24 DIAGNOSIS — IMO0002 Reserved for concepts with insufficient information to code with codable children: Secondary | ICD-10-CM | POA: Insufficient documentation

## 2012-11-24 DIAGNOSIS — Z8701 Personal history of pneumonia (recurrent): Secondary | ICD-10-CM | POA: Insufficient documentation

## 2012-11-24 DIAGNOSIS — E785 Hyperlipidemia, unspecified: Secondary | ICD-10-CM | POA: Insufficient documentation

## 2012-11-24 DIAGNOSIS — K219 Gastro-esophageal reflux disease without esophagitis: Secondary | ICD-10-CM | POA: Insufficient documentation

## 2012-11-24 DIAGNOSIS — Z880 Allergy status to penicillin: Secondary | ICD-10-CM | POA: Insufficient documentation

## 2012-11-24 HISTORY — PX: RADIOACTIVE SEED IMPLANT: SHX5150

## 2012-11-24 SURGERY — INSERTION, RADIATION SOURCE, PROSTATE
Anesthesia: General | Site: Prostate | Wound class: Clean Contaminated

## 2012-11-24 MED ORDER — PROPOFOL 10 MG/ML IV BOLUS
INTRAVENOUS | Status: DC | PRN
  Administered 2012-11-24: 180 mg via INTRAVENOUS

## 2012-11-24 MED ORDER — PROMETHAZINE HCL 25 MG/ML IJ SOLN
6.2500 mg | INTRAMUSCULAR | Status: DC | PRN
  Filled 2012-11-24: qty 1

## 2012-11-24 MED ORDER — FENTANYL CITRATE 0.05 MG/ML IJ SOLN
25.0000 ug | INTRAMUSCULAR | Status: DC | PRN
  Filled 2012-11-24: qty 1

## 2012-11-24 MED ORDER — CIPROFLOXACIN IN D5W 400 MG/200ML IV SOLN
400.0000 mg | INTRAVENOUS | Status: AC
  Administered 2012-11-24: 400 mg via INTRAVENOUS
  Filled 2012-11-24: qty 200

## 2012-11-24 MED ORDER — LIDOCAINE HCL 2 % EX GEL
CUTANEOUS | Status: DC | PRN
  Administered 2012-11-24: 1

## 2012-11-24 MED ORDER — GLYCOPYRROLATE 0.2 MG/ML IJ SOLN
INTRAMUSCULAR | Status: DC | PRN
  Administered 2012-11-24: 0.6 mg via INTRAVENOUS

## 2012-11-24 MED ORDER — NEOSTIGMINE METHYLSULFATE 1 MG/ML IJ SOLN
INTRAMUSCULAR | Status: DC | PRN
  Administered 2012-11-24: 4 mg via INTRAVENOUS

## 2012-11-24 MED ORDER — HYDROCODONE-ACETAMINOPHEN 5-325 MG PO TABS: 1.0000 | ORAL_TABLET | Freq: Four times a day (QID) | ORAL | Status: DC | PRN

## 2012-11-24 MED ORDER — MEPERIDINE HCL 25 MG/ML IJ SOLN
6.2500 mg | INTRAMUSCULAR | Status: DC | PRN
  Filled 2012-11-24: qty 1

## 2012-11-24 MED ORDER — LACTATED RINGERS IV SOLN
INTRAVENOUS | Status: DC
  Filled 2012-11-24: qty 1000

## 2012-11-24 MED ORDER — LIDOCAINE HCL (CARDIAC) 20 MG/ML IV SOLN
INTRAVENOUS | Status: DC | PRN
  Administered 2012-11-24: 75 mg via INTRAVENOUS

## 2012-11-24 MED ORDER — LACTATED RINGERS IV SOLN
INTRAVENOUS | Status: DC
  Administered 2012-11-24: 100 mL/h via INTRAVENOUS
  Filled 2012-11-24: qty 1000

## 2012-11-24 MED ORDER — ROCURONIUM BROMIDE 100 MG/10ML IV SOLN
INTRAVENOUS | Status: DC | PRN
  Administered 2012-11-24: 10 mg via INTRAVENOUS
  Administered 2012-11-24: 40 mg via INTRAVENOUS

## 2012-11-24 MED ORDER — FENTANYL CITRATE 0.05 MG/ML IJ SOLN
INTRAMUSCULAR | Status: DC | PRN
  Administered 2012-11-24 (×3): 25 ug via INTRAVENOUS
  Administered 2012-11-24: 12.5 ug via INTRAVENOUS
  Administered 2012-11-24 (×4): 25 ug via INTRAVENOUS
  Administered 2012-11-24: 12.5 ug via INTRAVENOUS

## 2012-11-24 MED ORDER — DEXAMETHASONE SODIUM PHOSPHATE 4 MG/ML IJ SOLN
INTRAMUSCULAR | Status: DC | PRN
  Administered 2012-11-24: 10 mg via INTRAVENOUS

## 2012-11-24 MED ORDER — IOHEXOL 350 MG/ML SOLN
INTRAVENOUS | Status: DC | PRN
  Administered 2012-11-24: 7 mL

## 2012-11-24 MED ORDER — STERILE WATER FOR IRRIGATION IR SOLN
Status: DC | PRN
  Administered 2012-11-24: 3000 mL via INTRAVESICAL

## 2012-11-24 MED ORDER — FLEET ENEMA 7-19 GM/118ML RE ENEM
1.0000 | ENEMA | Freq: Once | RECTAL | Status: DC
  Filled 2012-11-24: qty 1

## 2012-11-24 MED ORDER — CIPROFLOXACIN HCL 500 MG PO TABS: 500.0000 mg | ORAL_TABLET | Freq: Two times a day (BID) | ORAL | Status: DC

## 2012-11-24 MED ORDER — ONDANSETRON HCL 4 MG/2ML IJ SOLN
INTRAMUSCULAR | Status: DC | PRN
  Administered 2012-11-24: 4 mg via INTRAVENOUS

## 2012-11-24 MED ORDER — LACTATED RINGERS IV SOLN
INTRAVENOUS | Status: DC | PRN
  Administered 2012-11-24 (×2): via INTRAVENOUS

## 2012-11-24 SURGICAL SUPPLY — 24 items
BAG URINE DRAINAGE (UROLOGICAL SUPPLIES) ×2 IMPLANT
BLADE SURG ROTATE 9660 (MISCELLANEOUS) ×2 IMPLANT
CATH FOLEY 2WAY SLVR  5CC 16FR (CATHETERS) ×2
CATH FOLEY 2WAY SLVR 5CC 16FR (CATHETERS) ×2 IMPLANT
CATH ROBINSON RED A/P 20FR (CATHETERS) ×2 IMPLANT
CLOTH BEACON ORANGE TIMEOUT ST (SAFETY) ×2 IMPLANT
COVER MAYO STAND STRL (DRAPES) ×2 IMPLANT
COVER TABLE BACK 60X90 (DRAPES) ×2 IMPLANT
DRSG TEGADERM 4X4.75 (GAUZE/BANDAGES/DRESSINGS) ×2 IMPLANT
DRSG TEGADERM 8X12 (GAUZE/BANDAGES/DRESSINGS) ×2 IMPLANT
GAUZE SPONGE 4X4 12PLY STRL LF (GAUZE/BANDAGES/DRESSINGS) ×2 IMPLANT
GLOVE BIO SURGEON STRL SZ 6.5 (GLOVE) ×4 IMPLANT
GLOVE BIO SURGEON STRL SZ7.5 (GLOVE) ×12 IMPLANT
GLOVE ECLIPSE 6.0 STRL STRAW (GLOVE) ×2 IMPLANT
GLOVE ECLIPSE 8.0 STRL XLNG CF (GLOVE) IMPLANT
GOWN STRL REIN XL XLG (GOWN DISPOSABLE) ×2 IMPLANT
GOWN SURGICAL LARGE (GOWNS) ×2 IMPLANT
HOLDER FOLEY CATH W/STRAP (MISCELLANEOUS) ×2 IMPLANT
PACK CYSTOSCOPY (CUSTOM PROCEDURE TRAY) ×2 IMPLANT
SYRINGE 10CC LL (SYRINGE) ×2 IMPLANT
UNDERPAD 30X30 INCONTINENT (UNDERPADS AND DIAPERS) ×4 IMPLANT
WATER STERILE IRR 3000ML UROMA (IV SOLUTION) ×2 IMPLANT
WATER STERILE IRR 500ML POUR (IV SOLUTION) ×2 IMPLANT
selectseed ×2 IMPLANT

## 2012-11-24 NOTE — Progress Notes (Signed)
Instruction given how to empty foley cath and how to remove the cath in the am.

## 2012-11-24 NOTE — Transfer of Care (Signed)
Immediate Anesthesia Transfer of Care Note  Patient: Bruce Nunez  Procedure(s) Performed: Procedure(s) (LRB): RADIOACTIVE SEED IMPLANT (N/A)  Patient Location: PACU  Anesthesia Type: General  Level of Consciousness:drowsy  Airway & Oxygen Therapy: Patient Spontanous Breathing and Patient connected to face mask oxygen, oral airway out.  Post-op Assessment: Report given to PACU RN and Post -op Vital signs reviewed and stable  Post vital signs: Reviewed and stable  Complications: No apparent anesthesia complications

## 2012-11-24 NOTE — Anesthesia Postprocedure Evaluation (Signed)
  Anesthesia Post-op Note  Patient: Bruce Nunez  Procedure(s) Performed: Procedure(s) (LRB): RADIOACTIVE SEED IMPLANT (N/A)  Patient Location: PACU  Anesthesia Type: General  Level of Consciousness: awake and alert   Airway and Oxygen Therapy: Patient Spontanous Breathing  Post-op Pain: mild  Post-op Assessment: Post-op Vital signs reviewed, Patient's Cardiovascular Status Stable, Respiratory Function Stable, Patent Airway and No signs of Nausea or vomiting  Last Vitals:  Filed Vitals:   11/24/12 1300  BP: 158/68  Pulse: 73  Temp:   Resp: 19    Post-op Vital Signs: stable   Complications: No apparent anesthesia complications

## 2012-11-24 NOTE — Op Note (Signed)
Preoperative diagnosis: Clinical stage TI C adenocarcinoma the prostate  Postoperative diagnosis: Same  Procedure: I-125 prostate seed implantation with Nucletron robotic implanter  Surgeon: Valetta Fuller M.D. , Chipper Herb, M.D.  Anesthesia: Gen.  Indications: Patient  was diagnosed with clinical stage TI C prostate cancer. We had extensive discussion with him about treatment options versus. He elected to proceed with seed implantation. He underwent consultation my office as well as with Dr. Chipper Herb. He appeared to understand the advantages disadvantages potential risks of this treatment option. Full informed consent has been obtained. The patient is had preoperative ciprofloxacin. PAS compression boots were placed.  Technique and findings: Patient was brought the operating room where he had successful induction of general anesthesia. He was placed in lithotomy position and prepped and draped in usual manner. Appropriate surgical timeout was performed. Radiation oncology department placed a transrectal ultrasound probe anchoring stand. Foley catheter with contrast in the balloon was inserted without difficulty. Anchoring needles were placed within the prostate. Real-time contouring of the urethra prostate and rectum were performed and the dosing parameters were established. Targeted dose was 145 gray. We then came to the operating suite suite for placement of the needles. A second timeout was performed. All needle passage was done with real-time transrectal ultrasound guidance with the sagittal plane. A total of 25 needles were placed. See implantation itself was done with the robotic implanter. 71 active seeds were implanted. A Foley catheter was removed and flexible cystoscopy failed to show any seeds outside the prostate. The Foley catheter was inserted which drained clear urine. The patient was brought to recovery room in stable condition.

## 2012-11-24 NOTE — Progress Notes (Signed)
CC: Dr. [name], Dr. Barron Alvine  Diagnosis: Stage TI C. intermediate risk adenocarcinoma prostate  Intent: Curative  His seed implant date: 11/24/2012  Site/dose: Prostate 14,500 cGy, isotope I-125 implant and 71 seeds and 23 needles. Individual seed activity 0.44 mCi per seed for a total implant activity of 31.5 mCi.  Narrative: The patient appears to have undergone a successful Nucletron seed Selectron implant with Dr. Isabel Caprice.  Plan: The patient will return for a followup visit to see both of [place] in approximately 3 weeks. We will obtain a CT scan at that time to assess the quality of his implant.

## 2012-11-24 NOTE — Anesthesia Preprocedure Evaluation (Addendum)
Anesthesia Evaluation  Patient identified by MRN, date of birth, ID band Patient awake    Reviewed: Allergy & Precautions, H&P , NPO status , Patient's Chart, lab work & pertinent test results  Airway Mallampati: II TM Distance: >3 FB Neck ROM: Full    Dental  (+) Dental Advisory Given and Teeth Intact   Pulmonary asthma ,  breath sounds clear to auscultation        Cardiovascular hypertension, Pt. on medications Rhythm:Regular Rate:Normal     Neuro/Psych negative neurological ROS  negative psych ROS   GI/Hepatic Neg liver ROS, GERD-  ,  Endo/Other  negative endocrine ROS  Renal/GU negative Renal ROS     Musculoskeletal negative musculoskeletal ROS (+)   Abdominal   Peds  Hematology negative hematology ROS (+)   Anesthesia Other Findings   Reproductive/Obstetrics                         Anesthesia Physical Anesthesia Plan  ASA: II  Anesthesia Plan: General   Post-op Pain Management:    Induction: Intravenous  Airway Management Planned: Oral ETT  Additional Equipment:   Intra-op Plan:   Post-operative Plan: Extubation in OR  Informed Consent: I have reviewed the patients History and Physical, chart, labs and discussed the procedure including the risks, benefits and alternatives for the proposed anesthesia with the patient or authorized representative who has indicated his/her understanding and acceptance.   Dental advisory given  Plan Discussed with: CRNA  Anesthesia Plan Comments:         Anesthesia Quick Evaluation

## 2012-11-24 NOTE — Anesthesia Procedure Notes (Signed)
Procedure Name: Intubation Date/Time: 11/24/2012 10:19 AM Performed by: Jessica Priest Pre-anesthesia Checklist: Patient identified, Emergency Drugs available, Suction available and Patient being monitored Patient Re-evaluated:Patient Re-evaluated prior to inductionOxygen Delivery Method: Circle System Utilized Preoxygenation: Pre-oxygenation with 100% oxygen Intubation Type: IV induction Ventilation: Mask ventilation without difficulty Laryngoscope Size: Mac and 3 Grade View: Grade I Tube type: Oral Tube size: 7.0 mm Number of attempts: 1 Airway Equipment and Method: stylet and oral airway Placement Confirmation: ETT inserted through vocal cords under direct vision,  positive ETCO2 and breath sounds checked- equal and bilateral Secured at: 22 cm Tube secured with: Tape Dental Injury: Teeth and Oropharynx as per pre-operative assessment

## 2012-11-24 NOTE — Progress Notes (Signed)
[facility] Radiation Oncology Brachytherapy Operative Procedure Note  Name: Bruce Nunez MRN: [id number]  Date:   10/21/2012           DOB: Oct 19, 1940  Status:outpatient    CC: Dr. [name], Dr. Barron Alvine DIAGNOSIS: A 72 year old gentlemen with stage T1 C. adenocarcinoma of the prostate with a Gleason of 7 and a PSA of 5.8.  PROCEDURE: Insertion of radioactive I-125 seeds into the prostate gland.  RADIATION DOSE: 145 Gy, definitive therapy.  TECHNIQUE: Bruce Nunez was brought to the operating room with Dr. Isabel Caprice. He was placed in the dorsolithotomy position. He was catheterized and a rectal tube was inserted. The perineum was shaved, prepped and draped. The ultrasound probe was then introduced into the rectum to see the prostate gland.  TREATMENT DEVICE: A needle grid was attached to the ultrasound probe stand and anchor needles were placed.  COMPLEX ISODOSE CALCULATION: The prostate was imaged in 3D using a sagittal sweep of the prostate probe. These images were transferred to the planning computer. There, the prostate, urethra and rectum were defined on each axial reconstructed image. Then, the software created an optimized plan and a few seed positions were adjusted. Then the accepted plan was uploaded to the seed Selectron afterloading unit.  SPECIAL TREATMENT PROCEDURE/SUPERVISION AND HANDLING: The Nucletron FIRST system was used to place the needles under sagittal guidance. A total of 23 needles were used to deposit 71 seeds in the prostate gland. The individual seed activity was 0.44 mCi for a total implant activity of 31.5 mCi.  COMPLEX SIMULATION: At the end of the procedure, an anterior radiograph of the pelvis was obtained to document seed positioning and count. Cystoscopy was performed to check the urethra and bladder.  MICRODOSIMETRY: At the end of the procedure, the patient was emitting 0.15 mrem/hr at 1 meter. Accordingly, he was considered safe for  hospital discharge.  PLAN: The patient will return to the radiation oncology clinic for post implant CT dosimetry in three weeks.

## 2012-11-24 NOTE — Interval H&P Note (Signed)
History and Physical Interval Note:  11/24/2012 9:29 AM  Bruce Nunez  has presented today for surgery, with the diagnosis of PROSTATE CANCER  The various methods of treatment have been discussed with the patient and family. After consideration of risks, benefits and other options for treatment, the patient has consented to  Procedure(Nunez) with comments: RADIOACTIVE SEED IMPLANT (N/A) - 2 HRS  as a surgical intervention .  The patient'Nunez history has been reviewed, patient examined, no change in status, stable for surgery.  I have reviewed the patient'Nunez chart and labs.  Questions were answered to the patient'Nunez satisfaction.     Bruce Nunez

## 2012-11-25 ENCOUNTER — Encounter (HOSPITAL_BASED_OUTPATIENT_CLINIC_OR_DEPARTMENT_OTHER): Payer: Self-pay | Admitting: Urology

## 2012-12-16 ENCOUNTER — Telehealth: Payer: Self-pay | Admitting: *Deleted

## 2012-12-16 DIAGNOSIS — C61 Malignant neoplasm of prostate: Secondary | ICD-10-CM | POA: Diagnosis not present

## 2012-12-16 NOTE — Telephone Encounter (Signed)
CALLED PATIENT TO REMIND OF APPTS. FOR 12-17-12, LVM FOR A RETURN CALL

## 2012-12-17 ENCOUNTER — Ambulatory Visit
Admit: 2012-12-17 | Discharge: 2012-12-17 | Disposition: A | Discharge status: Home / Self Care | Payer: Medicare Other | Attending: Radiation Oncology | Admitting: Radiation Oncology

## 2012-12-17 ENCOUNTER — Ambulatory Visit
Admission: RE | Admit: 2012-12-17 | Discharge: 2012-12-17 | Disposition: A | Discharge status: Home / Self Care | Payer: Medicare Other | Source: Ambulatory Visit | Attending: Radiation Oncology | Admitting: Radiation Oncology

## 2012-12-17 VITALS — BP 154/57 | HR 75 | Temp 97.5°F | Ht 67.5 in | Wt 167.3 lb

## 2012-12-17 DIAGNOSIS — E785 Hyperlipidemia, unspecified: Secondary | ICD-10-CM | POA: Diagnosis not present

## 2012-12-17 DIAGNOSIS — C61 Malignant neoplasm of prostate: Secondary | ICD-10-CM

## 2012-12-17 DIAGNOSIS — I10 Essential (primary) hypertension: Secondary | ICD-10-CM | POA: Diagnosis not present

## 2012-12-17 DIAGNOSIS — Z79899 Other long term (current) drug therapy: Secondary | ICD-10-CM | POA: Diagnosis not present

## 2012-12-17 NOTE — Progress Notes (Signed)
Complex simulation note: The patient was taken to the CT simulator. He was placed supine. His pelvis was scanned. The CT data set was sent to the Nucletron system for contouring of his prostate and rectum. He'll then undergo 3-D simulation to assess the quality of his implant . 

## 2012-12-17 NOTE — Progress Notes (Signed)
Bruce Nunez here for follow up post seed.  He denies pain except for some discomfort after sitting for a long time on Sunday.  He does have fatigue.  He denies buring on urination.  He does have urinary frequency and feels some pressure when he starts to urinate.  He does have to get up 3 times per night to urinate.  He also reports an increase in diarrhea.

## 2012-12-17 NOTE — Progress Notes (Signed)
CC: Dr. Barron Alvine, Dr. [name]  Followup note: Mr. Pokorski returns today approximately 3 weeks following his prostate seed implant with Dr. Isabel Caprice in the management of his stage TI C. intermediate risk adenocarcinoma prostate. He is doing well from a GU and GI standpoint. He does report nocturia x2-3 which is his baseline. He does have slight "pressure" sensation on initiation of urination but no dysuria. He has a perineal discomfort at church this past weekend but this has resolved. He still Dr. Isabel Caprice yesterday and Dr. Isabel Caprice will see him again on July 22.  His CT scan today for his post implant dosimetry shows an excellent seed distribution.  Physical examination: Alert and oriented. Filed Vitals:   12/17/12 1007  BP: 154/57  Pulse: 75  Temp: 97.5 F (36.4 C)   He's not examined today.  Impression: Satisfactory progress.  Plan: He'll see Dr. Isabel Caprice for a followup visit on July 22. I've not scheduled the patient for a formal followup visit and I ask that Dr. Isabel Caprice keep me posted on his progress. We'll move ahead with his post implant dosimetry to assess the quality of his implant and forward this Dr. Isabel Caprice in the very near future.

## 2012-12-23 ENCOUNTER — Encounter: Payer: Self-pay | Admitting: Radiation Oncology

## 2012-12-23 DIAGNOSIS — Z79899 Other long term (current) drug therapy: Secondary | ICD-10-CM | POA: Diagnosis not present

## 2012-12-23 DIAGNOSIS — I10 Essential (primary) hypertension: Secondary | ICD-10-CM | POA: Diagnosis not present

## 2012-12-23 DIAGNOSIS — E785 Hyperlipidemia, unspecified: Secondary | ICD-10-CM | POA: Diagnosis not present

## 2012-12-23 DIAGNOSIS — C61 Malignant neoplasm of prostate: Secondary | ICD-10-CM | POA: Diagnosis not present

## 2012-12-23 NOTE — Progress Notes (Signed)
CC: Dr. Barron Alvine, Dr. [name]  Post implant/CT dosimetry/3-D simulation note:  The patient underwent post-implant CT dosimetry/3-D simulation on 12/23/2012. His intraoperative prostate volume by ultrasound was 37.9 cc while his postoperative prostate volume by CT was 38.5 cc, an excellent correlation. Dose volume histograms were obtained for the prostate and rectum. His prostate D 90 is 114.7% and V100 97.5%, both excellent. Only 0.29 cc of rectum received the prescribed dose of 14,500 cGy. In summary, the patient has excellent post implant dosimetry with a low risk for late rectal toxicity.

## 2013-02-10 DIAGNOSIS — E785 Hyperlipidemia, unspecified: Secondary | ICD-10-CM | POA: Diagnosis not present

## 2013-02-10 DIAGNOSIS — I10 Essential (primary) hypertension: Secondary | ICD-10-CM | POA: Diagnosis not present

## 2013-02-10 DIAGNOSIS — J301 Allergic rhinitis due to pollen: Secondary | ICD-10-CM | POA: Diagnosis not present

## 2013-02-10 DIAGNOSIS — C61 Malignant neoplasm of prostate: Secondary | ICD-10-CM | POA: Diagnosis not present

## 2013-03-16 DIAGNOSIS — D509 Iron deficiency anemia, unspecified: Secondary | ICD-10-CM | POA: Diagnosis not present

## 2013-03-17 DIAGNOSIS — N200 Calculus of kidney: Secondary | ICD-10-CM | POA: Diagnosis not present

## 2013-03-17 DIAGNOSIS — R972 Elevated prostate specific antigen [PSA]: Secondary | ICD-10-CM | POA: Diagnosis not present

## 2013-03-17 DIAGNOSIS — C61 Malignant neoplasm of prostate: Secondary | ICD-10-CM | POA: Diagnosis not present

## 2013-03-17 DIAGNOSIS — N401 Enlarged prostate with lower urinary tract symptoms: Secondary | ICD-10-CM | POA: Diagnosis not present

## 2013-04-01 ENCOUNTER — Other Ambulatory Visit: Payer: Self-pay

## 2013-05-14 DIAGNOSIS — J301 Allergic rhinitis due to pollen: Secondary | ICD-10-CM | POA: Diagnosis not present

## 2013-05-14 DIAGNOSIS — E785 Hyperlipidemia, unspecified: Secondary | ICD-10-CM | POA: Diagnosis not present

## 2013-05-14 DIAGNOSIS — J45909 Unspecified asthma, uncomplicated: Secondary | ICD-10-CM | POA: Diagnosis not present

## 2013-05-14 DIAGNOSIS — C61 Malignant neoplasm of prostate: Secondary | ICD-10-CM | POA: Diagnosis not present

## 2013-05-14 DIAGNOSIS — R7309 Other abnormal glucose: Secondary | ICD-10-CM | POA: Diagnosis not present

## 2013-06-03 DIAGNOSIS — E119 Type 2 diabetes mellitus without complications: Secondary | ICD-10-CM | POA: Diagnosis not present

## 2013-06-11 DIAGNOSIS — L821 Other seborrheic keratosis: Secondary | ICD-10-CM | POA: Diagnosis not present

## 2013-06-11 DIAGNOSIS — L578 Other skin changes due to chronic exposure to nonionizing radiation: Secondary | ICD-10-CM | POA: Diagnosis not present

## 2013-06-11 DIAGNOSIS — D1801 Hemangioma of skin and subcutaneous tissue: Secondary | ICD-10-CM | POA: Diagnosis not present

## 2013-06-11 DIAGNOSIS — L570 Actinic keratosis: Secondary | ICD-10-CM | POA: Diagnosis not present

## 2013-07-02 ENCOUNTER — Other Ambulatory Visit: Payer: Self-pay

## 2013-07-21 DIAGNOSIS — C61 Malignant neoplasm of prostate: Secondary | ICD-10-CM | POA: Diagnosis not present

## 2013-08-26 DIAGNOSIS — E785 Hyperlipidemia, unspecified: Secondary | ICD-10-CM | POA: Diagnosis not present

## 2013-08-26 DIAGNOSIS — E119 Type 2 diabetes mellitus without complications: Secondary | ICD-10-CM | POA: Diagnosis not present

## 2013-09-13 DIAGNOSIS — J0100 Acute maxillary sinusitis, unspecified: Secondary | ICD-10-CM | POA: Diagnosis not present

## 2013-12-15 DIAGNOSIS — L821 Other seborrheic keratosis: Secondary | ICD-10-CM | POA: Diagnosis not present

## 2013-12-15 DIAGNOSIS — L820 Inflamed seborrheic keratosis: Secondary | ICD-10-CM | POA: Diagnosis not present

## 2013-12-15 DIAGNOSIS — L570 Actinic keratosis: Secondary | ICD-10-CM | POA: Diagnosis not present

## 2013-12-18 DIAGNOSIS — L2089 Other atopic dermatitis: Secondary | ICD-10-CM | POA: Diagnosis not present

## 2013-12-20 DIAGNOSIS — L2089 Other atopic dermatitis: Secondary | ICD-10-CM | POA: Diagnosis not present

## 2014-01-19 DIAGNOSIS — N401 Enlarged prostate with lower urinary tract symptoms: Secondary | ICD-10-CM | POA: Diagnosis not present

## 2014-01-19 DIAGNOSIS — C61 Malignant neoplasm of prostate: Secondary | ICD-10-CM | POA: Diagnosis not present

## 2014-01-19 DIAGNOSIS — N139 Obstructive and reflux uropathy, unspecified: Secondary | ICD-10-CM | POA: Diagnosis not present

## 2014-01-19 DIAGNOSIS — R972 Elevated prostate specific antigen [PSA]: Secondary | ICD-10-CM | POA: Diagnosis not present

## 2014-01-19 DIAGNOSIS — N138 Other obstructive and reflux uropathy: Secondary | ICD-10-CM | POA: Diagnosis not present

## 2014-02-05 DIAGNOSIS — E119 Type 2 diabetes mellitus without complications: Secondary | ICD-10-CM | POA: Diagnosis not present

## 2014-02-05 DIAGNOSIS — E785 Hyperlipidemia, unspecified: Secondary | ICD-10-CM | POA: Diagnosis not present

## 2014-02-05 DIAGNOSIS — I10 Essential (primary) hypertension: Secondary | ICD-10-CM | POA: Diagnosis not present

## 2014-02-05 DIAGNOSIS — R079 Chest pain, unspecified: Secondary | ICD-10-CM | POA: Diagnosis not present

## 2014-02-10 DIAGNOSIS — R079 Chest pain, unspecified: Secondary | ICD-10-CM | POA: Diagnosis not present

## 2014-02-10 DIAGNOSIS — R0789 Other chest pain: Secondary | ICD-10-CM | POA: Diagnosis not present

## 2014-03-11 DIAGNOSIS — K921 Melena: Secondary | ICD-10-CM | POA: Diagnosis not present

## 2014-03-11 DIAGNOSIS — K4090 Unilateral inguinal hernia, without obstruction or gangrene, not specified as recurrent: Secondary | ICD-10-CM | POA: Diagnosis not present

## 2014-03-11 DIAGNOSIS — Z86010 Personal history of colonic polyps: Secondary | ICD-10-CM | POA: Diagnosis not present

## 2014-04-14 ENCOUNTER — Encounter (INDEPENDENT_AMBULATORY_CARE_PROVIDER_SITE_OTHER): Payer: Self-pay | Admitting: Surgery

## 2014-04-14 ENCOUNTER — Ambulatory Visit (INDEPENDENT_AMBULATORY_CARE_PROVIDER_SITE_OTHER): Payer: Medicare Other | Admitting: Surgery

## 2014-04-14 VITALS — BP 110/70 | HR 68 | Resp 18 | Ht 67.75 in | Wt 160.0 lb

## 2014-04-14 DIAGNOSIS — K4090 Unilateral inguinal hernia, without obstruction or gangrene, not specified as recurrent: Secondary | ICD-10-CM | POA: Diagnosis not present

## 2014-04-14 NOTE — Patient Instructions (Signed)

## 2014-04-14 NOTE — Progress Notes (Signed)
PHYSICIAN: Isabel Caprice. Hassell Done, M.D.DATE OF BIRTH: 1941/02/02  DATE OF PROCEDURE: 05/17/2004  DATE OF DISCHARGE:  OPERATIVE REPORT  PREOPERATIVE DIAGNOSIS: Villous adenoma of the ileocecal valve.  POSTOPERATIVE DIAGNOSIS: Villous adenoma of the ileocecal valve, permanent  sections pending.  PROCEDURE: Laparoscopic assisted right hemicolectomy.  SURGEON: Isabel Caprice. Hassell Done, M.D.  ASSISTANT: Haywood Lasso, M.D.   Chief Complaint:  Right inguinal hernia  History of Present Illness:  Bruce Nunez is an 73 y.o. male on whom I did a right hemicolectomy 10 years ago for a tumor at the ileocecal valve. He developed this hernia earlier this year. It is bulging and is easily reducible. He would like to get it repaired. I gave him a booklet on hernia repairs and I described this to him in some detail. We will schedule this at his convenience under general anesthesia.  He was referred by Dr. Cristina Gong who has been checking him for anemia. He is also treated for prostate cancer with radiation therapy back in 2013.  Past Medical History  Diagnosis Date  . Prostate cancer 08/15/12    gleason 3+4=7,3+3=6,PSA=4.6,volume=30cc  . Hypertension   . Reflux esophagitis   . Hemorrhoids   . Hyperlipidemia   . Kidney stones   . Asthma     childhood-   2013 bronchitis w/wheezing  . Anemia     recent iron supplement    Past Surgical History  Procedure Laterality Date  . Prostate biopsy  08/15/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  . Cataract surgery  2009  . Knee surgery Right 2012  . Partial colectomy  2005  . Throat surgery  1940's    as child (age 74)growth that created fissure/corrective surgery  . Tonsillectomy      age 24  . Cystoscopy    . Radioactive seed implant N/A 11/24/2012    Procedure: RADIOACTIVE SEED IMPLANT;  Surgeon: Bernestine Amass, MD;  Location: [facility];  Service: Urology;  Laterality: N/A;   71seeds implanted  no seeds found in bladder    Current Outpatient Prescriptions   Medication Sig Dispense Refill  . albuterol (PROVENTIL HFA;VENTOLIN HFA) 108 (90 BASE) MCG/ACT inhaler Inhale 2 puffs into the lungs every 6 (six) hours as needed for wheezing.      Marland Kitchen amLODipine (NORVASC) 10 MG tablet Take 10 mg by mouth daily.      . bisoprolol-hydrochlorothiazide (ZIAC) 10-6.25 MG per tablet Take 1 tablet by mouth daily.      . ferrous sulfate 325 (65 FE) MG tablet Take 325 mg by mouth daily with breakfast.      . fexofenadine (ALLEGRA) 180 MG tablet Take 180 mg by mouth daily.      . fluticasone-salmeterol (ADVAIR HFA) 115-21 MCG/ACT inhaler Inhale 2 puffs into the lungs 2 (two) times daily.      Marland Kitchen losartan (COZAAR) 100 MG tablet Take 100 mg by mouth daily.      . metFORMIN (GLUCOPHAGE) 500 MG tablet Take 500 mg by mouth daily with breakfast.      . mometasone (NASONEX) 50 MCG/ACT nasal spray Place 2 sprays into the nose as needed.      . pravastatin (PRAVACHOL) 10 MG tablet Take 10 mg by mouth daily.      . Tamsulosin HCl (FLOMAX) 0.4 MG CAPS Take 0.4 mg by mouth daily.      . CEDAX 400 MG capsule        No current facility-administered medications for this visit.   Aspirin; Monodox; Penicillins;  Sulfa antibiotics; and Tramadol Family History  Problem Relation Age of Onset  . Heart attack    . Brain cancer    . Coronary artery disease    . Prostate cancer     Social History:   reports that he has never smoked. He has never used smokeless tobacco. He reports that he does not drink alcohol or use illicit drugs.   REVIEW OF SYSTEMS : Negative except for reflux wheezing nasal congestion diarrhea nausea and some difficulty with urinating  Physical Exam:   Blood pressure 110/70, pulse 68, resp. rate 18, height 5' 7.75" (1.721 m), weight 160 lb (72.576 kg). Body mass index is 24.5 kg/(m^2).  Gen:  WDWN white male NAD  Neurological: Alert and oriented to person, place, and time. Motor and sensory function is grossly intact  Head: Normocephalic and atraumatic.   Eyes: Conjunctivae are normal. Pupils are equal, round, and reactive to light. No scleral icterus.  Neck: Normal range of motion. Neck supple. No tracheal deviation or thyromegaly present.  Cardiovascular:  SR without murmurs or gallops.  No carotid bruits Breast:  Not examined Respiratory: Effort normal.  No respiratory distress. No chest wall tenderness. Breath sounds normal.  No wheezes, rales or rhonchi.  Abdomen:  Transverse incision from MA colectomy is healed without hernia. GU:  Easily reducible right inguinal hernia. No left inguinal hernia palpated. Musculoskeletal: Normal range of motion. Extremities are nontender. No cyanosis, edema or clubbing noted Lymphadenopathy: No cervical, preauricular, postauricular or axillary adenopathy is present Skin: Skin is warm and dry. No rash noted. No diaphoresis. No erythema. No pallor. Pscyh: Normal mood and affect. Behavior is normal. Judgment and thought content normal.   LABORATORY RESULTS: No results found for this or any previous visit (from the past 48 hour(s)).   RADIOLOGY RESULTS: No results found.  Problem List: Patient Active Problem List   Diagnosis Date Noted  . Prostate cancer 08/15/2012    Assessment & Plan: Symptomatic right inguinal hernia. Plan open right inguinal hernia repair.    Matt B. Hassell Done, MD, [facility] [phone] beeper [phone]  04/14/2014 5:04 PM

## 2014-05-10 NOTE — Patient Instructions (Addendum)
ESPIRIDION SUPINSKI  05/10/2014                           YOUR PROCEDURE IS SCHEDULED ON: 05/17/14               ENTER THRU  MAIN HOSPITAL ENTRANCE AND                            FOLLOW  SIGNS TO SHORT STAY CENTER                 ARRIVE AT SHORT STAY AT: 9:00 AM               CALL THIS NUMBER IF ANY PROBLEMS THE DAY OF SURGERY :               [phone]                                REMEMBER:   Do not eat food or drink liquids AFTER MIDNIGHT             STOP ALL ASPIRIN AND HERBAL MEDS 5 DAYS PREOP                  Take these medicines the morning of surgery with               A SIPS OF WATER :  AMLODIPINE / PRAVASTATIN / VESICARE / FLOMAX / USE ADVAIR INHALER       Do not wear jewelry, make-up   Do not wear lotions, powders, or perfumes.   Do not shave legs or underarms 12 hrs. before surgery (men may shave face)  Do not bring valuables to the hospital.  Contacts, dentures or bridgework may not be worn into surgery.  Leave suitcase in the car. After surgery it may be brought to your room.  For patients admitted to the hospital more than one night, checkout time is            11:00 AM                                                       The day of discharge.   Patients discharged the day of surgery will not be allowed to drive home.            If going home same day of surgery, must have someone stay with you              FIRST 24 hrs at home and arrange for some one to drive you              home from hospital.   ________________________________________________________________________              **   USE FLEET ENEMA THE NIGHT BEFORE SURGERY **  [place]  Before surgery, you can play an important role.  Because skin is not sterile, your skin needs to be as free of germs as possible.  You can reduce the number of germs on your skin by washing with CHG  (chlorahexidine gluconate) soap before surgery.  CHG is an antiseptic cleaner which kills germs and bonds with the skin to continue killing germs even after washing. Please DO NOT use if you have an allergy to CHG or antibacterial soaps.  If your skin becomes reddened/irritated stop using the CHG and inform your nurse when you arrive at Short Stay. Do not shave (including legs and underarms) for at least 48 hours prior to the first CHG shower.  You may shave your face. Please follow these instructions carefully:   1.  Shower with CHG Soap the night before surgery and the  morning of Surgery.   2.  If you choose to wash your hair, wash your hair first as usual with your  normal  Shampoo.   3.  After you shampoo, rinse your hair and body thoroughly to remove the  shampoo.                                         4.  Use CHG as you would any other liquid soap.  You can apply chg directly  to the skin and wash . Gently wash with scrungie or clean wascloth    5.  Apply the CHG Soap to your body ONLY FROM THE NECK DOWN.   Do not use on open                           Wound or open sores. Avoid contact with eyes, ears mouth and genitals (private parts).                        Genitals (private parts) with your normal soap.              6.  Wash thoroughly, paying special attention to the area where your surgery  will be performed.   7.  Thoroughly rinse your body with warm water from the neck down.   8.  DO NOT shower/wash with your normal soap after using and rinsing off  the CHG Soap .                9.  Pat yourself dry with a clean towel.             10.  Wear clean pajamas.             11.  Place clean sheets on your bed the night of your first shower and do not  sleep with pets.  Day of Surgery : Do not apply any lotions/deodorants the morning of surgery.  Please wear clean clothes to the hospital/surgery center.  FAILURE TO FOLLOW THESE INSTRUCTIONS MAY RESULT IN THE CANCELLATION OF  YOUR SURGERY    PATIENT SIGNATURE_________________________________  ______________________________________________________________________

## 2014-05-11 ENCOUNTER — Encounter (HOSPITAL_COMMUNITY): Payer: Self-pay | Admitting: Pharmacy Technician

## 2014-05-11 ENCOUNTER — Encounter (HOSPITAL_COMMUNITY)
Admission: RE | Admit: 2014-05-11 | Discharge: 2014-05-11 | Disposition: A | Discharge status: Home / Self Care | Payer: Medicare Other | Source: Ambulatory Visit | Attending: Surgery | Admitting: Surgery

## 2014-05-11 ENCOUNTER — Encounter (HOSPITAL_COMMUNITY): Payer: Self-pay

## 2014-05-11 ENCOUNTER — Ambulatory Visit (HOSPITAL_COMMUNITY)
Admission: RE | Admit: 2014-05-11 | Discharge: 2014-05-11 | Disposition: A | Discharge status: Home / Self Care | Payer: Medicare Other | Source: Ambulatory Visit | Attending: Anesthesiology | Admitting: Anesthesiology

## 2014-05-11 DIAGNOSIS — K4090 Unilateral inguinal hernia, without obstruction or gangrene, not specified as recurrent: Secondary | ICD-10-CM | POA: Diagnosis not present

## 2014-05-11 DIAGNOSIS — Z01818 Encounter for other preprocedural examination: Secondary | ICD-10-CM | POA: Insufficient documentation

## 2014-05-11 HISTORY — DX: Gastro-esophageal reflux disease without esophagitis: K21.9

## 2014-05-11 HISTORY — DX: Other complications of anesthesia, initial encounter: T88.59XA

## 2014-05-11 HISTORY — DX: Personal history of other medical treatment: Z92.89

## 2014-05-11 HISTORY — DX: Type 2 diabetes mellitus without complications: E11.9

## 2014-05-11 HISTORY — DX: Adverse effect of unspecified anesthetic, initial encounter: T41.45XA

## 2014-05-11 HISTORY — DX: Personal history of urinary calculi: Z87.442

## 2014-05-11 HISTORY — DX: Personal history of other malignant neoplasm of skin: Z85.828

## 2014-05-11 HISTORY — DX: Unilateral inguinal hernia, without obstruction or gangrene, not specified as recurrent: K40.90

## 2014-05-11 HISTORY — DX: Personal history of other malignant neoplasm of large intestine: Z85.038

## 2014-05-11 HISTORY — DX: Frequency of micturition: R35.0

## 2014-05-11 HISTORY — DX: Personal history of peptic ulcer disease: Z87.11

## 2014-05-11 LAB — CBC
HCT: 45.9 % (ref 39.0–52.0)
Hemoglobin: 15.5 g/dL (ref 13.0–17.0)
MCH: 28.9 pg (ref 26.0–34.0)
MCHC: 33.8 g/dL (ref 30.0–36.0)
MCV: 85.6 fL (ref 78.0–100.0)
Platelets: 190 10*3/uL (ref 150–400)
RBC: 5.36 MIL/uL (ref 4.22–5.81)
RDW: 12.2 % (ref 11.5–15.5)
WBC: 6.5 10*3/uL (ref 4.0–10.5)

## 2014-05-11 LAB — BASIC METABOLIC PANEL
ANION GAP: 11 (ref 5–15)
BUN: 21 mg/dL (ref 6–23)
CALCIUM: 10 mg/dL (ref 8.4–10.5)
CO2: 29 meq/L (ref 19–32)
CREATININE: 0.98 mg/dL (ref 0.50–1.35)
Chloride: 101 mEq/L (ref 96–112)
GFR calc Af Amer: >90 mL/min (ref >90)
GFR calc non Af Amer: 79 mL/min — ABNORMAL LOW (ref >90)
Glucose, Bld: 107 mg/dL — ABNORMAL HIGH (ref 70–99)
Potassium: 4.3 mEq/L (ref 3.7–5.3)
Sodium: 141 mEq/L (ref 137–147)

## 2014-05-17 ENCOUNTER — Ambulatory Visit (HOSPITAL_COMMUNITY)
Admission: RE | Admit: 2014-05-17 | Discharge: 2014-05-17 | Disposition: A | Discharge status: Home / Self Care | Payer: Medicare Other | Source: Ambulatory Visit | Attending: Surgery | Admitting: Surgery

## 2014-05-17 ENCOUNTER — Encounter (HOSPITAL_COMMUNITY)
Admission: RE | Disposition: A | Discharge status: Home / Self Care | Payer: Self-pay | Source: Ambulatory Visit | Attending: Surgery

## 2014-05-17 ENCOUNTER — Encounter (HOSPITAL_COMMUNITY): Payer: Medicare Other | Admitting: Anesthesiology

## 2014-05-17 ENCOUNTER — Encounter (HOSPITAL_COMMUNITY): Payer: Self-pay

## 2014-05-17 ENCOUNTER — Ambulatory Visit (HOSPITAL_COMMUNITY): Payer: Medicare Other | Admitting: Anesthesiology

## 2014-05-17 DIAGNOSIS — J45909 Unspecified asthma, uncomplicated: Secondary | ICD-10-CM | POA: Diagnosis not present

## 2014-05-17 DIAGNOSIS — E785 Hyperlipidemia, unspecified: Secondary | ICD-10-CM | POA: Diagnosis not present

## 2014-05-17 DIAGNOSIS — Z8546 Personal history of malignant neoplasm of prostate: Secondary | ICD-10-CM | POA: Diagnosis not present

## 2014-05-17 DIAGNOSIS — Z9889 Other specified postprocedural states: Secondary | ICD-10-CM

## 2014-05-17 DIAGNOSIS — Z8719 Personal history of other diseases of the digestive system: Secondary | ICD-10-CM

## 2014-05-17 DIAGNOSIS — K2100 Gastro-esophageal reflux disease with esophagitis, without bleeding: Secondary | ICD-10-CM | POA: Diagnosis not present

## 2014-05-17 DIAGNOSIS — K219 Gastro-esophageal reflux disease without esophagitis: Secondary | ICD-10-CM | POA: Diagnosis not present

## 2014-05-17 DIAGNOSIS — I10 Essential (primary) hypertension: Secondary | ICD-10-CM | POA: Diagnosis not present

## 2014-05-17 DIAGNOSIS — Z9089 Acquired absence of other organs: Secondary | ICD-10-CM | POA: Insufficient documentation

## 2014-05-17 DIAGNOSIS — K4090 Unilateral inguinal hernia, without obstruction or gangrene, not specified as recurrent: Secondary | ICD-10-CM | POA: Diagnosis not present

## 2014-05-17 DIAGNOSIS — D649 Anemia, unspecified: Secondary | ICD-10-CM | POA: Diagnosis not present

## 2014-05-17 HISTORY — DX: Personal history of other diseases of the digestive system: Z87.19

## 2014-05-17 HISTORY — PX: INGUINAL HERNIA REPAIR: SHX194

## 2014-05-17 LAB — GLUCOSE, CAPILLARY
GLUCOSE-CAPILLARY: 143 mg/dL — AB (ref 70–99)
Glucose-Capillary: 114 mg/dL — ABNORMAL HIGH (ref 70–99)

## 2014-05-17 SURGERY — REPAIR, HERNIA, INGUINAL, ADULT
Anesthesia: General | Site: Groin | Laterality: Right

## 2014-05-17 MED ORDER — SODIUM CHLORIDE 0.9 % IJ SOLN: 3.0000 mL | INTRAMUSCULAR | Status: DC | PRN

## 2014-05-17 MED ORDER — PROMETHAZINE HCL 25 MG/ML IJ SOLN: 6.2500 mg | INTRAMUSCULAR | Status: DC | PRN

## 2014-05-17 MED ORDER — HEPARIN SODIUM (PORCINE) 5000 UNIT/ML IJ SOLN
5000.0000 [IU] | Freq: Once | INTRAMUSCULAR | Status: AC
  Administered 2014-05-17: 5000 [IU] via SUBCUTANEOUS
  Filled 2014-05-17: qty 1

## 2014-05-17 MED ORDER — LIDOCAINE HCL (CARDIAC) 20 MG/ML IV SOLN
INTRAVENOUS | Status: AC
  Filled 2014-05-17: qty 5

## 2014-05-17 MED ORDER — MORPHINE SULFATE 10 MG/ML IJ SOLN: 1.0000 mg | INTRAMUSCULAR | Status: DC | PRN

## 2014-05-17 MED ORDER — ACETAMINOPHEN 325 MG PO TABS
650.0000 mg | ORAL_TABLET | ORAL | Status: DC | PRN
Start: 2014-05-17 — End: 2014-05-17

## 2014-05-17 MED ORDER — SODIUM CHLORIDE 0.9 % IJ SOLN: 3.0000 mL | Freq: Two times a day (BID) | INTRAMUSCULAR | Status: DC

## 2014-05-17 MED ORDER — LACTATED RINGERS IV SOLN
INTRAVENOUS | Status: DC | PRN
  Administered 2014-05-17: 11:00:00 via INTRAVENOUS

## 2014-05-17 MED ORDER — BISOPROLOL FUMARATE 10 MG PO TABS
10.0000 mg | ORAL_TABLET | Freq: Once | ORAL | Status: DC
  Filled 2014-05-17 (×2): qty 1

## 2014-05-17 MED ORDER — FENTANYL CITRATE 0.05 MG/ML IJ SOLN
INTRAMUSCULAR | Status: AC
  Filled 2014-05-17: qty 5

## 2014-05-17 MED ORDER — HYDROCODONE-ACETAMINOPHEN 5-325 MG PO TABS: 1.0000 | ORAL_TABLET | ORAL | Status: DC | PRN

## 2014-05-17 MED ORDER — EPHEDRINE SULFATE 50 MG/ML IJ SOLN
INTRAMUSCULAR | Status: DC | PRN
  Administered 2014-05-17: 5 mg via INTRAVENOUS

## 2014-05-17 MED ORDER — MEPERIDINE HCL 50 MG/ML IJ SOLN: 6.2500 mg | INTRAMUSCULAR | Status: DC | PRN

## 2014-05-17 MED ORDER — FENTANYL CITRATE 0.05 MG/ML IJ SOLN
INTRAMUSCULAR | Status: DC | PRN
  Administered 2014-05-17 (×2): 50 ug via INTRAVENOUS

## 2014-05-17 MED ORDER — BISOPROLOL FUMARATE 5 MG PO TABS
10.0000 mg | ORAL_TABLET | Freq: Once | ORAL | Status: AC
  Administered 2014-05-17: 10 mg via ORAL
  Filled 2014-05-17: qty 2

## 2014-05-17 MED ORDER — FENTANYL CITRATE 0.05 MG/ML IJ SOLN
25.0000 ug | INTRAMUSCULAR | Status: DC | PRN
  Administered 2014-05-17: 50 ug via INTRAVENOUS

## 2014-05-17 MED ORDER — CIPROFLOXACIN IN D5W 400 MG/200ML IV SOLN
INTRAVENOUS | Status: AC
  Filled 2014-05-17: qty 200

## 2014-05-17 MED ORDER — ONDANSETRON HCL 4 MG/2ML IJ SOLN
INTRAMUSCULAR | Status: AC
  Filled 2014-05-17: qty 2

## 2014-05-17 MED ORDER — ROCURONIUM BROMIDE 100 MG/10ML IV SOLN
INTRAVENOUS | Status: AC
  Filled 2014-05-17: qty 1

## 2014-05-17 MED ORDER — OXYCODONE HCL 5 MG PO TABS
5.0000 mg | ORAL_TABLET | ORAL | Status: DC | PRN
  Administered 2014-05-17: 5 mg via ORAL
  Filled 2014-05-17: qty 1

## 2014-05-17 MED ORDER — PROPOFOL 10 MG/ML IV BOLUS
INTRAVENOUS | Status: DC | PRN
  Administered 2014-05-17: 150 mg via INTRAVENOUS

## 2014-05-17 MED ORDER — FENTANYL CITRATE 0.05 MG/ML IJ SOLN
INTRAMUSCULAR | Status: AC
  Filled 2014-05-17: qty 2

## 2014-05-17 MED ORDER — CIPROFLOXACIN IN D5W 400 MG/200ML IV SOLN
400.0000 mg | INTRAVENOUS | Status: AC
  Administered 2014-05-17: 400 mg via INTRAVENOUS

## 2014-05-17 MED ORDER — LIDOCAINE HCL (CARDIAC) 20 MG/ML IV SOLN
INTRAVENOUS | Status: DC | PRN
  Administered 2014-05-17: 60 mg via INTRAVENOUS

## 2014-05-17 MED ORDER — POTASSIUM CHLORIDE IN NACL 20-0.45 MEQ/L-% IV SOLN
INTRAVENOUS | Status: DC
  Filled 2014-05-17 (×3): qty 1000

## 2014-05-17 MED ORDER — PROPOFOL 10 MG/ML IV BOLUS
INTRAVENOUS | Status: AC
  Filled 2014-05-17: qty 20

## 2014-05-17 MED ORDER — BUPIVACAINE LIPOSOME 1.3 % IJ SUSP
20.0000 mL | Freq: Once | INTRAMUSCULAR | Status: AC
  Administered 2014-05-17: 20 mL
  Filled 2014-05-17: qty 20

## 2014-05-17 MED ORDER — CEFAZOLIN SODIUM-DEXTROSE 2-3 GM-% IV SOLR: 2.0000 g | INTRAVENOUS | Status: DC

## 2014-05-17 MED ORDER — ONDANSETRON HCL 4 MG/2ML IJ SOLN
INTRAMUSCULAR | Status: DC | PRN
  Administered 2014-05-17: 4 mg via INTRAVENOUS

## 2014-05-17 MED ORDER — SODIUM CHLORIDE 0.9 % IV SOLN: 250.0000 mL | INTRAVENOUS | Status: DC | PRN

## 2014-05-17 MED ORDER — ACETAMINOPHEN 650 MG RE SUPP
650.0000 mg | RECTAL | Status: DC | PRN
Start: 2014-05-17 — End: 2014-05-17
  Filled 2014-05-17: qty 1

## 2014-05-17 SURGICAL SUPPLY — 38 items
ADH SKN CLS APL DERMABOND .7 (GAUZE/BANDAGES/DRESSINGS)
APL SKNCLS STERI-STRIP NONHPOA (GAUZE/BANDAGES/DRESSINGS)
BENZOIN TINCTURE PRP APPL 2/3 (GAUZE/BANDAGES/DRESSINGS) IMPLANT
BLADE HEX COATED 2.75 (ELECTRODE) ×3 IMPLANT
BLADE SURG 15 STRL LF DISP TIS (BLADE) ×1 IMPLANT
BLADE SURG 15 STRL SS (BLADE) ×3
CLOSURE WOUND 1/2 X4 (GAUZE/BANDAGES/DRESSINGS)
DECANTER SPIKE VIAL GLASS SM (MISCELLANEOUS) ×3 IMPLANT
DERMABOND ADVANCED (GAUZE/BANDAGES/DRESSINGS)
DERMABOND ADVANCED .7 DNX12 (GAUZE/BANDAGES/DRESSINGS) IMPLANT
DISSECTOR ROUND CHERRY 3/8 STR (MISCELLANEOUS) ×3 IMPLANT
DRAIN PENROSE 18X1/2 LTX STRL (DRAIN) ×3 IMPLANT
DRAPE LAPAROTOMY TRNSV 102X78 (DRAPE) ×3 IMPLANT
ELECT REM PT RETURN 9FT ADLT (ELECTROSURGICAL) ×3
ELECTRODE REM PT RTRN 9FT ADLT (ELECTROSURGICAL) ×1 IMPLANT
GAUZE SPONGE 4X4 12PLY STRL (GAUZE/BANDAGES/DRESSINGS) ×3 IMPLANT
GLOVE BIOGEL M 8.0 STRL (GLOVE) ×3 IMPLANT
GOWN STRL REUS W/TWL XL LVL3 (GOWN DISPOSABLE) ×9 IMPLANT
KIT BASIN OR (CUSTOM PROCEDURE TRAY) ×3 IMPLANT
MESH HERNIA 6X6 BARD (Mesh General) ×1 IMPLANT
MESH HERNIA BARD 6X6 (Mesh General) ×2 IMPLANT
NEEDLE HYPO 22GX1.5 SAFETY (NEEDLE) ×3 IMPLANT
PACK BASIC VI WITH GOWN DISP (CUSTOM PROCEDURE TRAY) ×3 IMPLANT
PENCIL BUTTON HOLSTER BLD 10FT (ELECTRODE) ×3 IMPLANT
SPONGE LAP 4X18 X RAY DECT (DISPOSABLE) ×3 IMPLANT
STAPLER VISISTAT 35W (STAPLE) IMPLANT
STRIP CLOSURE SKIN 1/2X4 (GAUZE/BANDAGES/DRESSINGS) IMPLANT
SUT PROLENE 2 0 CT2 30 (SUTURE) ×6 IMPLANT
SUT SILK 2 0 SH (SUTURE) IMPLANT
SUT VIC AB 2-0 SH 27 (SUTURE) ×3
SUT VIC AB 2-0 SH 27X BRD (SUTURE) ×1 IMPLANT
SUT VIC AB 4-0 SH 18 (SUTURE) ×3 IMPLANT
SUT VICRYL 4-0 (SUTURE) ×3 IMPLANT
SYR 20CC LL (SYRINGE) ×3 IMPLANT
SYR BULB IRRIGATION 50ML (SYRINGE) ×3 IMPLANT
TOWEL OR 17X26 10 PK STRL BLUE (TOWEL DISPOSABLE) ×3 IMPLANT
TOWEL OR NON WOVEN STRL DISP B (DISPOSABLE) ×3 IMPLANT
YANKAUER SUCT BULB TIP 10FT TU (MISCELLANEOUS) ×3 IMPLANT

## 2014-05-17 NOTE — H&P (Signed)
Chief Complaint: Right inguinal hernia  History of Present Illness: Bruce Nunez is an 73 y.o. male on whom I did a right hemicolectomy 10 years ago for a tumor at the ileocecal valve. He developed this hernia earlier this year. It is bulging and is easily reducible. He would like to get it repaired. I gave him a booklet on hernia repairs and I described this to him in some detail. We will schedule this at his convenience under general anesthesia.  He was referred by Dr. Cristina Gong who has been checking him for anemia. He is also treated for prostate cancer with radiation therapy back in 2013.  Past Medical History   Diagnosis  Date   .  Prostate cancer  08/15/12     gleason 3+4=7,3+3=6,PSA=4.6,volume=30cc   .  Hypertension    .  Reflux esophagitis    .  Hemorrhoids    .  Hyperlipidemia    .  Kidney stones    .  Asthma      childhood- 2013 bronchitis w/wheezing   .  Anemia      recent iron supplement    Past Surgical History   Procedure  Laterality  Date   .  Prostate biopsy   08/15/12     Adenocarcinoma   .  Cataract surgery   2009   .  Knee surgery  Right  2012   .  Partial colectomy   2005   .  Throat surgery   1940's     as child (age 57)growth that created fissure/corrective surgery   .  Tonsillectomy       age 89   .  Cystoscopy     .  Radioactive seed implant  N/A  11/24/2012     Procedure: RADIOACTIVE SEED IMPLANT; Surgeon: Bernestine Amass, MD; Location: [facility]; Service: Urology; Laterality: N/A; 71seeds implanted  no seeds found in bladder    Current Outpatient Prescriptions   Medication  Sig  Dispense  Refill   .  albuterol (PROVENTIL HFA;VENTOLIN HFA) 108 (90 BASE) MCG/ACT inhaler  Inhale 2 puffs into the lungs every 6 (six) hours as needed for wheezing.     Marland Kitchen  amLODipine (NORVASC) 10 MG tablet  Take 10 mg by mouth daily.     .  bisoprolol-hydrochlorothiazide (ZIAC) 10-6.25 MG per tablet  Take 1 tablet by mouth daily.     .  ferrous sulfate 325 (65 FE)  MG tablet  Take 325 mg by mouth daily with breakfast.     .  fexofenadine (ALLEGRA) 180 MG tablet  Take 180 mg by mouth daily.     .  fluticasone-salmeterol (ADVAIR HFA) 115-21 MCG/ACT inhaler  Inhale 2 puffs into the lungs 2 (two) times daily.     Marland Kitchen  losartan (COZAAR) 100 MG tablet  Take 100 mg by mouth daily.     .  metFORMIN (GLUCOPHAGE) 500 MG tablet  Take 500 mg by mouth daily with breakfast.     .  mometasone (NASONEX) 50 MCG/ACT nasal spray  Place 2 sprays into the nose as needed.     .  pravastatin (PRAVACHOL) 10 MG tablet  Take 10 mg by mouth daily.     .  Tamsulosin HCl (FLOMAX) 0.4 MG CAPS  Take 0.4 mg by mouth daily.     .  CEDAX 400 MG capsule       No current facility-administered medications for this visit.   Aspirin; Monodox; Penicillins; Sulfa antibiotics; and  Tramadol  Family History   Problem  Relation  Age of Onset   .  Heart attack     .  Brain cancer     .  Coronary artery disease     .  Prostate cancer     Social History: reports that he has never smoked. He has never used smokeless tobacco. He reports that he does not drink alcohol or use illicit drugs.  REVIEW OF SYSTEMS :  Negative except for reflux wheezing nasal congestion diarrhea nausea and some difficulty with urinating  Physical Exam:  Blood pressure 110/70, pulse 68, resp. rate 18, height 5' 7.75" (1.721 m), weight 160 lb (72.576 kg).  Body mass index is 24.5 kg/(m^2).  Gen: WDWN white male NAD  Neurological: Alert and oriented to person, place, and time. Motor and sensory function is grossly intact  Head: Normocephalic and atraumatic.  Eyes: Conjunctivae are normal. Pupils are equal, round, and reactive to light. No scleral icterus.  Neck: Normal range of motion. Neck supple. No tracheal deviation or thyromegaly present.  Cardiovascular: SR without murmurs or gallops. No carotid bruits  Breast: Not examined  Respiratory: Effort normal. No respiratory distress. No chest wall tenderness. Breath sounds  normal. No wheezes, rales or rhonchi.  Abdomen: Transverse incision from MA colectomy is healed without hernia.  GU: Easily reducible right inguinal hernia. No left inguinal hernia palpated.  Musculoskeletal: Normal range of motion. Extremities are nontender. No cyanosis, edema or clubbing noted Lymphadenopathy: No cervical, preauricular, postauricular or axillary adenopathy is present Skin: Skin is warm and dry. No rash noted. No diaphoresis. No erythema. No pallor. Pscyh: Normal mood and affect. Behavior is normal. Judgment and thought content normal.  LABORATORY RESULTS:  No results found for this or any previous visit (from the past 48 hour(s)).  RADIOLOGY RESULTS:  No results found.  Problem List:  Patient Active Problem List    Diagnosis  Date Noted   .  Prostate cancer  08/15/2012   Assessment & Plan:  Symptomatic right inguinal hernia. Plan open right inguinal hernia repair.  Matt B. Hassell Done, MD, [facility]  [phone] beeper  [phone]

## 2014-05-17 NOTE — Op Note (Signed)
Surgeon: Kaylyn Lim, MD, FACS  Asst:  none  Anes:  General by LMA  Procedure: Open right inguinal hernia  Diagnosis: Indirect hernia  Complications: none  EBL:   minimal cc  Drains: none  Description of Procedure:  The patient was taken to OR 6 at [facility].  After anesthesia was administered and the patient was prepped a timeout was performed.  An oblique incision was made and carried down through generous fatty tissue to reveal the external oblique aponeurosis.Fibers were incised and carried down to open the external ring the cord was then mobilized and a Penrose drain placed about it. A large hernia was seen anterior medially and this was dissected free of the cord structures. This was a large indirect hernia. I opened this and found that a sliding component containing mostly fat and I could not rule out that there was possibly another wall of something that I could not perceive since he had a previous right hemicolectomy in the anatomy has been distorted. I elected to reduce all of that and then tightened the internal ring with 2-0 silk figure-of-eight suture.  I then cut a piece of Marlex type mesh to fit the floor and sutured along the inguinal ligament with running 2-0 Prolene. Medially it was sutured with a running 2-0 Prolene and then the ends were laid across each other and affixed with a horizontal mattress of 2-0 Prolene. These were then tucked beneath the external Keenan Bachelor which was then closed in running 2-0 Vicryl. 4-0 Vicryl was used close the subcutaneous tissue. Prior to closure injected 20 cc of Exparel into the fascia. Skin was approximated with 4-0 Vicryl and with a running subcuticular 5-0 Monocryl.  The patient tolerated the procedure well and was taken to the PACU in stable condition.     Matt B. Hassell Done, [place], [facility], [place]

## 2014-05-17 NOTE — Progress Notes (Signed)
Pt ambulated down hall with stand by assist. Pt tolerated well.   Pt voided moderated amount clear yellow urine.

## 2014-05-17 NOTE — Interval H&P Note (Signed)
History and Physical Interval Note:  05/17/2014 10:31 AM  Bruce Nunez  has presented today for surgery, with the diagnosis of right inguinal hernia  The various methods of treatment have been discussed with the patient and family. After consideration of risks, benefits and other options for treatment, the patient has consented to  Procedure(s): RIGHT INGUINAL HERNIA REPAIR  (Right) as a surgical intervention .  The patient's history has been reviewed, patient examined, no change in status, stable for surgery.  I have reviewed the patient's chart and labs.  Questions were answered to the patient's satisfaction.     Hadiya Spoerl B

## 2014-05-17 NOTE — Anesthesia Postprocedure Evaluation (Signed)
  Anesthesia Post-op Note  Patient: Bruce Nunez  Procedure(s) Performed: Procedure(s) (LRB): RIGHT INGUINAL HERNIA REPAIR  (Right)  Patient Location: PACU  Anesthesia Type: General  Level of Consciousness: awake and alert   Airway and Oxygen Therapy: Patient Spontanous Breathing  Post-op Pain: mild  Post-op Assessment: Post-op Vital signs reviewed, Patient's Cardiovascular Status Stable, Respiratory Function Stable, Patent Airway and No signs of Nausea or vomiting  Last Vitals:  Filed Vitals:   05/17/14 1315  BP: 135/58  Pulse: 66  Temp: 36.4 C  Resp: 19    Post-op Vital Signs: stable   Complications: No apparent anesthesia complications

## 2014-05-17 NOTE — Transfer of Care (Signed)
Immediate Anesthesia Transfer of Care Note  Patient: Bruce Nunez  Procedure(s) Performed: Procedure(s): RIGHT INGUINAL HERNIA REPAIR  (Right)  Patient Location: PACU  Anesthesia Type:General  Level of Consciousness: awake, alert  and oriented  Airway & Oxygen Therapy: Patient Spontanous Breathing and Patient connected to face mask oxygen  Post-op Assessment: Report given to PACU RN and Post -op Vital signs reviewed and stable  Post vital signs: Reviewed and stable  Complications: No apparent anesthesia complications

## 2014-05-17 NOTE — Discharge Instructions (Signed)
Inguinal Hernia, Adult °Muscles help keep everything in the body in its proper place. But if a weak spot in the muscles develops, something can poke through. That is called a hernia. When this happens in the lower part of the belly (abdomen), it is called an inguinal hernia. (It takes its name from a part of the body in this region called the inguinal canal.) A weak spot in the wall of muscles lets some fat or part of the small intestine bulge through. An inguinal hernia can develop at any age. Men get them more often than women. °CAUSES  °In adults, an inguinal hernia develops over time. °· It can be triggered by: °¨ Suddenly straining the muscles of the lower abdomen. °¨ Lifting heavy objects. °¨ Straining to have a bowel movement. Difficult bowel movements (constipation) can lead to this. °¨ Constant coughing. This may be caused by smoking or lung disease. °¨ Being overweight. °¨ Being pregnant. °¨ Working at a job that requires long periods of standing or heavy lifting. °¨ Having had an inguinal hernia before. °One type can be an emergency situation. It is called a strangulated inguinal hernia. It develops if part of the small intestine slips through the weak spot and cannot get back into the abdomen. The blood supply can be cut off. If that happens, part of the intestine may die. This situation requires emergency surgery. °SYMPTOMS  °Often, a small inguinal hernia has no symptoms. It is found when a healthcare provider does a physical exam. Larger hernias usually have symptoms.  °· In adults, symptoms may include: °¨ A lump in the groin. This is easier to see when the person is standing. It might disappear when lying down. °¨ In men, a lump in the scrotum. °¨ Pain or burning in the groin. This occurs especially when lifting, straining or coughing. °¨ A dull ache or feeling of pressure in the groin. °· Signs of a strangulated hernia can include: °¨ A bulge in the groin that becomes very painful and tender to the  touch. °¨ A bulge that turns red or purple. °¨ Fever, nausea and vomiting. °¨ Inability to have a bowel movement or to pass gas. °DIAGNOSIS  °To decide if you have an inguinal hernia, a healthcare provider will probably do a physical examination. °· This will include asking questions about any symptoms you have noticed. °· The healthcare provider might feel the groin area and ask you to cough. If an inguinal hernia is felt, the healthcare provider may try to slide it back into the abdomen. °· Usually no other tests are needed. °TREATMENT  °Treatments can vary. The size of the hernia makes a difference. Options include: °· Watchful waiting. This is often suggested if the hernia is small and you have had no symptoms. °¨ No medical procedure will be done unless symptoms develop. °¨ You will need to watch closely for symptoms. If any occur, contact your healthcare provider right away. °· Surgery. This is used if the hernia is larger or you have symptoms. °¨ Open surgery. This is usually an outpatient procedure (you will not stay overnight in a hospital). An cut (incision) is made through the skin in the groin. The hernia is put back inside the abdomen. The weak area in the muscles is then repaired by herniorrhaphy or hernioplasty. Herniorrhaphy: in this type of surgery, the weak muscles are sewn back together. Hernioplasty: a patch or mesh is used to close the weak area in the abdominal wall. °¨ Laparoscopy.   In this procedure, a surgeon makes small incisions. A thin tube with a tiny video camera (called a laparoscope) is put into the abdomen. The surgeon repairs the hernia with mesh by looking with the video camera and using two long instruments. HOME CARE INSTRUCTIONS   After surgery to repair an inguinal hernia:  You will need to take pain medicine prescribed by your healthcare provider. Follow all directions carefully.  You will need to take care of the wound from the incision.  Your activity will be  restricted for awhile. This will probably include no heavy lifting for several weeks. You also should not do anything too active for a few weeks. When you can return to work will depend on the type of job that you have.  During "watchful waiting" periods, you should:  Maintain a healthy weight.  Eat a diet high in fiber (fruits, vegetables and whole grains).  Drink plenty of fluids to avoid constipation. This means drinking enough water and other liquids to keep your urine clear or pale yellow.  Do not lift heavy objects.  Do not stand for long periods of time.  Quit smoking. This should keep you from developing a frequent cough. SEEK MEDICAL CARE IF:   A bulge develops in your groin area.  You feel pain, a burning sensation or pressure in the groin. This might be worse if you are lifting or straining.  You develop a fever of more than 100.5 F (38.1 C). SEEK IMMEDIATE MEDICAL CARE IF:   Pain in the groin increases suddenly.  A bulge in the groin gets bigger suddenly and does not go down.  For men, there is sudden pain in the scrotum. Or, the size of the scrotum increases.  A bulge in the groin area becomes red or purple and is painful to touch.  You have nausea or vomiting that does not go away.  You feel your heart beating much faster than normal.  You cannot have a bowel movement or pass gas.  You develop a fever of more than 102.0 F (38.9 C). Document Released: 12/30/2008 Document Revised: 11/05/2011 Document Reviewed: 12/30/2008 [facility] Patient Information 2015 [place], [place]. This information is not intended to replace advice given to you by your health care provider. Make sure you discuss any questions you have with your health care provider. Inguinal Hernia, Adult  Care After Refer to this sheet in the next few weeks. These discharge instructions provide you with general information on caring for yourself after you leave the hospital. Your caregiver may also  give you specific instructions. Your treatment has been planned according to the most current medical practices available, but unavoidable complications sometimes occur. If you have any problems or questions after discharge, please call your caregiver. HOME CARE INSTRUCTIONS  Put ice on the operative site.  Put ice in a plastic bag.  Place a towel between your skin and the bag.  Leave the ice on for 15-20 minutes at a time, 03-04 times a day while awake.  Change bandages (dressings) as directed.  Keep the wound dry and clean. The wound may be washed gently with soap and water. Gently blot or dab the wound dry. It is okay to take showers 24 to 48 hours after surgery. Do not take baths, use swimming pools, or use hot tubs for 10 days, or as directed by your caregiver.  Only take over-the-counter or prescription medicines for pain, discomfort, or fever as directed by your caregiver.  Continue your normal diet  as directed.  Do not lift anything more than 10 pounds or play contact sports for 3 weeks, or as directed. SEEK MEDICAL CARE IF:  There is redness, swelling, or increasing pain in the wound.  There is fluid (pus) coming from the wound.  There is drainage from a wound lasting longer than 1 day.  You have an oral temperature above 102 F (38.9 C).  You notice a bad smell coming from the wound or dressing.  The wound breaks open after the stitches (sutures) have been removed.  You notice increasing pain in the shoulders (shoulder strap areas).  You develop dizzy episodes or fainting while standing.  You feel sick to your stomach (nauseous) or throw up (vomit). SEEK IMMEDIATE MEDICAL CARE IF:  You develop a rash.  You have difficulty breathing.  You develop a reaction or have side effects to medicines you were given. MAKE SURE YOU:   Understand these instructions.  Will watch your condition.  Will get help right away if you are not doing well or get worse. Document  Released: 09/13/2006 Document Revised: 11/05/2011 Document Reviewed: 07/13/2009 [facility] Patient Information 2015 [place], [place]. This information is not intended to replace advice given to you by your health care provider. Make sure you discuss any questions you have with your health care provider.

## 2014-05-17 NOTE — Anesthesia Preprocedure Evaluation (Addendum)
Anesthesia Evaluation  Patient identified by MRN, date of birth, ID band Patient awake    Reviewed: Allergy & Precautions, H&P , NPO status , Patient's Chart, lab work & pertinent test results  History of Anesthesia Complications Negative for: history of anesthetic complications  Airway Mallampati: II TM Distance: >3 FB Neck ROM: Full    Dental no notable dental hx.    Pulmonary neg pulmonary ROS,  breath sounds clear to auscultation  Pulmonary exam normal       Cardiovascular hypertension, Pt. on medications Rhythm:Regular Rate:Normal     Neuro/Psych negative neurological ROS  negative psych ROS   GI/Hepatic Neg liver ROS, GERD-  Medicated and Controlled,  Endo/Other  diabetes, Type 2, Oral Hypoglycemic Agents  Renal/GU negative Renal ROS  negative genitourinary   Musculoskeletal negative musculoskeletal ROS (+)   Abdominal   Peds negative pediatric ROS (+)  Hematology negative hematology ROS (+)   Anesthesia Other Findings   Reproductive/Obstetrics negative OB ROS                          Anesthesia Physical Anesthesia Plan  ASA: II  Anesthesia Plan: General   Post-op Pain Management:    Induction: Intravenous  Airway Management Planned: LMA  Additional Equipment:   Intra-op Plan:   Post-operative Plan: Extubation in OR  Informed Consent: I have reviewed the patients History and Physical, chart, labs and discussed the procedure including the risks, benefits and alternatives for the proposed anesthesia with the patient or authorized representative who has indicated his/her understanding and acceptance.   Dental advisory given  Plan Discussed with: CRNA  Anesthesia Plan Comments:         Anesthesia Quick Evaluation

## 2014-05-18 ENCOUNTER — Encounter (HOSPITAL_COMMUNITY): Payer: Self-pay | Admitting: Surgery

## 2014-06-16 DIAGNOSIS — L570 Actinic keratosis: Secondary | ICD-10-CM | POA: Diagnosis not present

## 2014-06-16 DIAGNOSIS — D1801 Hemangioma of skin and subcutaneous tissue: Secondary | ICD-10-CM | POA: Diagnosis not present

## 2014-06-16 DIAGNOSIS — L821 Other seborrheic keratosis: Secondary | ICD-10-CM | POA: Diagnosis not present

## 2014-06-16 DIAGNOSIS — L720 Epidermal cyst: Secondary | ICD-10-CM | POA: Diagnosis not present

## 2014-07-26 DIAGNOSIS — J301 Allergic rhinitis due to pollen: Secondary | ICD-10-CM | POA: Diagnosis not present

## 2014-07-26 DIAGNOSIS — J189 Pneumonia, unspecified organism: Secondary | ICD-10-CM | POA: Diagnosis not present

## 2014-07-26 DIAGNOSIS — Z6824 Body mass index (BMI) 24.0-24.9, adult: Secondary | ICD-10-CM | POA: Diagnosis not present

## 2014-07-26 DIAGNOSIS — J45902 Unspecified asthma with status asthmaticus: Secondary | ICD-10-CM | POA: Diagnosis not present

## 2014-07-27 DIAGNOSIS — C61 Malignant neoplasm of prostate: Secondary | ICD-10-CM | POA: Diagnosis not present

## 2014-08-10 DIAGNOSIS — N401 Enlarged prostate with lower urinary tract symptoms: Secondary | ICD-10-CM | POA: Diagnosis not present

## 2014-08-10 DIAGNOSIS — E785 Hyperlipidemia, unspecified: Secondary | ICD-10-CM | POA: Diagnosis not present

## 2014-08-10 DIAGNOSIS — J301 Allergic rhinitis due to pollen: Secondary | ICD-10-CM | POA: Diagnosis not present

## 2014-08-10 DIAGNOSIS — C61 Malignant neoplasm of prostate: Secondary | ICD-10-CM | POA: Diagnosis not present

## 2014-08-10 DIAGNOSIS — I10 Essential (primary) hypertension: Secondary | ICD-10-CM | POA: Diagnosis not present

## 2014-08-10 DIAGNOSIS — E119 Type 2 diabetes mellitus without complications: Secondary | ICD-10-CM | POA: Diagnosis not present

## 2014-08-10 DIAGNOSIS — J45909 Unspecified asthma, uncomplicated: Secondary | ICD-10-CM | POA: Diagnosis not present

## 2014-08-10 DIAGNOSIS — Z6823 Body mass index (BMI) 23.0-23.9, adult: Secondary | ICD-10-CM | POA: Diagnosis not present

## 2014-09-23 DIAGNOSIS — E119 Type 2 diabetes mellitus without complications: Secondary | ICD-10-CM | POA: Diagnosis not present

## 2014-11-09 DIAGNOSIS — E119 Type 2 diabetes mellitus without complications: Secondary | ICD-10-CM | POA: Diagnosis not present

## 2014-11-09 DIAGNOSIS — J45909 Unspecified asthma, uncomplicated: Secondary | ICD-10-CM | POA: Diagnosis not present

## 2014-11-09 DIAGNOSIS — I10 Essential (primary) hypertension: Secondary | ICD-10-CM | POA: Diagnosis not present

## 2014-11-09 DIAGNOSIS — N401 Enlarged prostate with lower urinary tract symptoms: Secondary | ICD-10-CM | POA: Diagnosis not present

## 2014-11-09 DIAGNOSIS — Z6824 Body mass index (BMI) 24.0-24.9, adult: Secondary | ICD-10-CM | POA: Diagnosis not present

## 2014-11-09 DIAGNOSIS — E785 Hyperlipidemia, unspecified: Secondary | ICD-10-CM | POA: Diagnosis not present

## 2014-12-03 DIAGNOSIS — N3281 Overactive bladder: Secondary | ICD-10-CM | POA: Diagnosis not present

## 2014-12-03 DIAGNOSIS — C61 Malignant neoplasm of prostate: Secondary | ICD-10-CM | POA: Diagnosis not present

## 2014-12-16 DIAGNOSIS — L570 Actinic keratosis: Secondary | ICD-10-CM | POA: Diagnosis not present

## 2014-12-16 DIAGNOSIS — C44321 Squamous cell carcinoma of skin of nose: Secondary | ICD-10-CM | POA: Diagnosis not present

## 2014-12-16 DIAGNOSIS — L821 Other seborrheic keratosis: Secondary | ICD-10-CM | POA: Diagnosis not present

## 2014-12-16 DIAGNOSIS — D1801 Hemangioma of skin and subcutaneous tissue: Secondary | ICD-10-CM | POA: Diagnosis not present

## 2015-02-09 DIAGNOSIS — I10 Essential (primary) hypertension: Secondary | ICD-10-CM | POA: Diagnosis not present

## 2015-02-09 DIAGNOSIS — E785 Hyperlipidemia, unspecified: Secondary | ICD-10-CM | POA: Diagnosis not present

## 2015-02-09 DIAGNOSIS — N401 Enlarged prostate with lower urinary tract symptoms: Secondary | ICD-10-CM | POA: Diagnosis not present

## 2015-02-09 DIAGNOSIS — E119 Type 2 diabetes mellitus without complications: Secondary | ICD-10-CM | POA: Diagnosis not present

## 2015-02-09 DIAGNOSIS — J45909 Unspecified asthma, uncomplicated: Secondary | ICD-10-CM | POA: Diagnosis not present

## 2015-02-09 DIAGNOSIS — J301 Allergic rhinitis due to pollen: Secondary | ICD-10-CM | POA: Diagnosis not present

## 2015-05-12 DIAGNOSIS — Z23 Encounter for immunization: Secondary | ICD-10-CM | POA: Diagnosis not present

## 2015-05-12 DIAGNOSIS — J301 Allergic rhinitis due to pollen: Secondary | ICD-10-CM | POA: Diagnosis not present

## 2015-05-12 DIAGNOSIS — J45909 Unspecified asthma, uncomplicated: Secondary | ICD-10-CM | POA: Diagnosis not present

## 2015-05-12 DIAGNOSIS — Z9181 History of falling: Secondary | ICD-10-CM | POA: Diagnosis not present

## 2015-05-12 DIAGNOSIS — E119 Type 2 diabetes mellitus without complications: Secondary | ICD-10-CM | POA: Diagnosis not present

## 2015-05-12 DIAGNOSIS — I10 Essential (primary) hypertension: Secondary | ICD-10-CM | POA: Diagnosis not present

## 2015-05-12 DIAGNOSIS — E785 Hyperlipidemia, unspecified: Secondary | ICD-10-CM | POA: Diagnosis not present

## 2015-06-13 DIAGNOSIS — Z23 Encounter for immunization: Secondary | ICD-10-CM | POA: Diagnosis not present

## 2015-06-17 DIAGNOSIS — Z8546 Personal history of malignant neoplasm of prostate: Secondary | ICD-10-CM | POA: Diagnosis not present

## 2015-06-17 DIAGNOSIS — N3281 Overactive bladder: Secondary | ICD-10-CM | POA: Diagnosis not present

## 2015-06-24 DIAGNOSIS — L820 Inflamed seborrheic keratosis: Secondary | ICD-10-CM | POA: Diagnosis not present

## 2015-06-24 DIAGNOSIS — L821 Other seborrheic keratosis: Secondary | ICD-10-CM | POA: Diagnosis not present

## 2015-06-24 DIAGNOSIS — D485 Neoplasm of uncertain behavior of skin: Secondary | ICD-10-CM | POA: Diagnosis not present

## 2015-06-24 DIAGNOSIS — L570 Actinic keratosis: Secondary | ICD-10-CM | POA: Diagnosis not present

## 2015-08-08 DIAGNOSIS — Z6824 Body mass index (BMI) 24.0-24.9, adult: Secondary | ICD-10-CM | POA: Diagnosis not present

## 2015-08-08 DIAGNOSIS — R079 Chest pain, unspecified: Secondary | ICD-10-CM | POA: Diagnosis not present

## 2015-08-08 DIAGNOSIS — J189 Pneumonia, unspecified organism: Secondary | ICD-10-CM | POA: Diagnosis not present

## 2015-08-08 DIAGNOSIS — J301 Allergic rhinitis due to pollen: Secondary | ICD-10-CM | POA: Diagnosis not present

## 2015-08-08 DIAGNOSIS — R011 Cardiac murmur, unspecified: Secondary | ICD-10-CM | POA: Diagnosis not present

## 2015-08-10 DIAGNOSIS — R079 Chest pain, unspecified: Secondary | ICD-10-CM | POA: Diagnosis not present

## 2015-08-10 DIAGNOSIS — R011 Cardiac murmur, unspecified: Secondary | ICD-10-CM | POA: Diagnosis not present

## 2015-08-12 DIAGNOSIS — N401 Enlarged prostate with lower urinary tract symptoms: Secondary | ICD-10-CM | POA: Diagnosis not present

## 2015-08-12 DIAGNOSIS — J45909 Unspecified asthma, uncomplicated: Secondary | ICD-10-CM | POA: Diagnosis not present

## 2015-08-12 DIAGNOSIS — E785 Hyperlipidemia, unspecified: Secondary | ICD-10-CM | POA: Diagnosis not present

## 2015-08-12 DIAGNOSIS — E119 Type 2 diabetes mellitus without complications: Secondary | ICD-10-CM | POA: Diagnosis not present

## 2015-08-12 DIAGNOSIS — I10 Essential (primary) hypertension: Secondary | ICD-10-CM | POA: Diagnosis not present

## 2015-08-12 DIAGNOSIS — Z6823 Body mass index (BMI) 23.0-23.9, adult: Secondary | ICD-10-CM | POA: Diagnosis not present

## 2015-08-12 DIAGNOSIS — C61 Malignant neoplasm of prostate: Secondary | ICD-10-CM | POA: Diagnosis not present

## 2015-08-12 DIAGNOSIS — I351 Nonrheumatic aortic (valve) insufficiency: Secondary | ICD-10-CM | POA: Diagnosis not present

## 2015-09-27 DIAGNOSIS — H43393 Other vitreous opacities, bilateral: Secondary | ICD-10-CM | POA: Diagnosis not present

## 2015-09-27 DIAGNOSIS — Z961 Presence of intraocular lens: Secondary | ICD-10-CM | POA: Diagnosis not present

## 2015-09-27 DIAGNOSIS — E119 Type 2 diabetes mellitus without complications: Secondary | ICD-10-CM | POA: Diagnosis not present

## 2015-09-27 DIAGNOSIS — H40013 Open angle with borderline findings, low risk, bilateral: Secondary | ICD-10-CM | POA: Diagnosis not present

## 2015-11-07 DIAGNOSIS — I351 Nonrheumatic aortic (valve) insufficiency: Secondary | ICD-10-CM | POA: Diagnosis not present

## 2015-11-07 DIAGNOSIS — N401 Enlarged prostate with lower urinary tract symptoms: Secondary | ICD-10-CM | POA: Diagnosis not present

## 2015-11-07 DIAGNOSIS — I10 Essential (primary) hypertension: Secondary | ICD-10-CM | POA: Diagnosis not present

## 2015-11-07 DIAGNOSIS — E119 Type 2 diabetes mellitus without complications: Secondary | ICD-10-CM | POA: Diagnosis not present

## 2015-11-07 DIAGNOSIS — C61 Malignant neoplasm of prostate: Secondary | ICD-10-CM | POA: Diagnosis not present

## 2015-11-07 DIAGNOSIS — Z6824 Body mass index (BMI) 24.0-24.9, adult: Secondary | ICD-10-CM | POA: Diagnosis not present

## 2015-11-07 DIAGNOSIS — E785 Hyperlipidemia, unspecified: Secondary | ICD-10-CM | POA: Diagnosis not present

## 2015-11-07 DIAGNOSIS — K210 Gastro-esophageal reflux disease with esophagitis: Secondary | ICD-10-CM | POA: Diagnosis not present

## 2015-11-10 DIAGNOSIS — E119 Type 2 diabetes mellitus without complications: Secondary | ICD-10-CM | POA: Diagnosis not present

## 2015-11-10 DIAGNOSIS — E785 Hyperlipidemia, unspecified: Secondary | ICD-10-CM | POA: Diagnosis not present

## 2015-11-22 DIAGNOSIS — C61 Malignant neoplasm of prostate: Secondary | ICD-10-CM | POA: Diagnosis not present

## 2015-11-28 DIAGNOSIS — N3281 Overactive bladder: Secondary | ICD-10-CM | POA: Diagnosis not present

## 2015-11-28 DIAGNOSIS — Z8546 Personal history of malignant neoplasm of prostate: Secondary | ICD-10-CM | POA: Diagnosis not present

## 2015-12-23 DIAGNOSIS — L821 Other seborrheic keratosis: Secondary | ICD-10-CM | POA: Diagnosis not present

## 2015-12-23 DIAGNOSIS — L918 Other hypertrophic disorders of the skin: Secondary | ICD-10-CM | POA: Diagnosis not present

## 2015-12-23 DIAGNOSIS — L570 Actinic keratosis: Secondary | ICD-10-CM | POA: Diagnosis not present

## 2015-12-23 DIAGNOSIS — D1801 Hemangioma of skin and subcutaneous tissue: Secondary | ICD-10-CM | POA: Diagnosis not present

## 2015-12-23 DIAGNOSIS — L578 Other skin changes due to chronic exposure to nonionizing radiation: Secondary | ICD-10-CM | POA: Diagnosis not present

## 2016-01-08 DIAGNOSIS — J209 Acute bronchitis, unspecified: Secondary | ICD-10-CM | POA: Diagnosis not present

## 2016-02-10 DIAGNOSIS — J301 Allergic rhinitis due to pollen: Secondary | ICD-10-CM | POA: Diagnosis not present

## 2016-02-10 DIAGNOSIS — Z6824 Body mass index (BMI) 24.0-24.9, adult: Secondary | ICD-10-CM | POA: Diagnosis not present

## 2016-02-10 DIAGNOSIS — I351 Nonrheumatic aortic (valve) insufficiency: Secondary | ICD-10-CM | POA: Diagnosis not present

## 2016-02-10 DIAGNOSIS — C61 Malignant neoplasm of prostate: Secondary | ICD-10-CM | POA: Diagnosis not present

## 2016-02-10 DIAGNOSIS — N401 Enlarged prostate with lower urinary tract symptoms: Secondary | ICD-10-CM | POA: Diagnosis not present

## 2016-02-10 DIAGNOSIS — I10 Essential (primary) hypertension: Secondary | ICD-10-CM | POA: Diagnosis not present

## 2016-02-10 DIAGNOSIS — J45909 Unspecified asthma, uncomplicated: Secondary | ICD-10-CM | POA: Diagnosis not present

## 2016-02-10 DIAGNOSIS — E119 Type 2 diabetes mellitus without complications: Secondary | ICD-10-CM | POA: Diagnosis not present

## 2016-02-10 DIAGNOSIS — E785 Hyperlipidemia, unspecified: Secondary | ICD-10-CM | POA: Diagnosis not present

## 2016-02-10 DIAGNOSIS — K210 Gastro-esophageal reflux disease with esophagitis: Secondary | ICD-10-CM | POA: Diagnosis not present

## 2016-03-27 DIAGNOSIS — H40013 Open angle with borderline findings, low risk, bilateral: Secondary | ICD-10-CM | POA: Diagnosis not present

## 2016-04-20 DIAGNOSIS — R1031 Right lower quadrant pain: Secondary | ICD-10-CM | POA: Diagnosis not present

## 2016-05-03 NOTE — Progress Notes (Signed)
Please place orders in EPIC as patient is being scheduled for pre-op appointment at [facility]! Thank you! 

## 2016-05-04 ENCOUNTER — Encounter (HOSPITAL_COMMUNITY): Payer: Self-pay

## 2016-05-04 ENCOUNTER — Ambulatory Visit: Payer: Self-pay | Admitting: Surgery

## 2016-05-04 NOTE — H&P (Signed)
Bruce Nunez 04/20/2016 9:36 AM Location: [facility] Patient #: [id number] DOB: 1940-12-13 Married / Language: English / Race: White Male   History of Present Illness [name] B. Hassell Done MD; 04/20/2016 10:34 AM) The patient is a 75 year old male who presents with an inguinal hernia. The hernia(s) is/are located on the right side. The pain is located in the right inguinal area. The patient describes the pain as burning. Onset was sudden 6 month(s) ago. Onset followed bending (was pruning shrubs about 6 months ago-pain has persisted). This hernia has previously been repaired once (right indirect repaired open by me in 2015).    Allergies Malachi Bonds, CMA; 04/20/2016 9:45 AM) Aspirin *ANALGESICS - NonNarcotic* TraMADol HCl *CHEMICALS* Penicillins Sulfa Drugs Monodox *TETRACYCLINES*  Medication History (Chemira Jones, CMA; 04/20/2016 9:47 AM) Lisbeth Ply (4MG  Tablet ER 24HR, Oral) Active. Levothyroxine Sodium (25MCG Tablet, Oral) Active. Pravastatin Sodium (10MG  Tablet, Oral) Active. Fluticasone Propionate (50MCG/ACT Suspension, Nasal) Active. Albuterol Sulfate HFA (108 (90 Base)MCG/ACT Aerosol Soln, Inhalation) Active. Norvasc (10MG  Tablet, Oral) Active. Ziac (10-6.25MG  Tablet, Oral) Active. Allegra Allergy (180MG  Tablet, Oral) Active. Advair HFA (115-21MCG/ACT Aerosol, Inhalation) Active. Cozaar (100MG  Tablet, Oral) Active. MetFORMIN HCl ER (500MG  Tablet ER 24HR, Oral) Active. PriLOSEC OTC (20MG  Tablet DR, Oral) Active. Flomax (0.4MG  Capsule, Oral) Active. Medications Reconciled  Vitals (Chemira Jones CMA; 04/20/2016 9:44 AM) 04/20/2016 9:44 AM Weight: 160.6 lb Height: 67in Body Surface Area: 1.84 m Body Mass Index: 25.15 kg/m  Temp.: 98.26F(Oral)  Pulse: 76 (Regular)  BP: 112/64 (Sitting, Left Arm, Standard)       Physical Exam (Klever Twyford B. Hassell Done MD; 04/20/2016 10:36 AM) Male Genitourinary Note: Has had seed implants for prostate  cancer. I cannot feel a bulge but he is tender at the external ring. Burning. once or twice it has radiated into the testicle     Assessment & Plan [name] B. Hassell Done MD; 04/20/2016 10:38 AM) PAIN OF RIGHT INGUINAL RING (R10.31) Impression: I have done a right colectomy on him back in 2005. I think that it would probably be best for me to put in the scope and look at his inguinal region from within first. If there is a recurrent hernia that I think I should try to fix that with the robot and do a tap. If there is no recurrent hernia noted then I may need to cut down on his prior repair from anteriorly and removed sutures at his internal ring. If he has too many adhesions that may have to not do his surgery robotically but at least try.

## 2016-05-07 ENCOUNTER — Encounter (HOSPITAL_COMMUNITY): Payer: Self-pay

## 2016-05-07 ENCOUNTER — Encounter (HOSPITAL_COMMUNITY)
Admission: RE | Admit: 2016-05-07 | Discharge: 2016-05-07 | Disposition: A | Discharge status: Home / Self Care | Payer: Medicare Other | Source: Ambulatory Visit | Attending: Surgery | Admitting: Surgery

## 2016-05-07 DIAGNOSIS — K4091 Unilateral inguinal hernia, without obstruction or gangrene, recurrent: Secondary | ICD-10-CM | POA: Diagnosis present

## 2016-05-07 DIAGNOSIS — E119 Type 2 diabetes mellitus without complications: Secondary | ICD-10-CM | POA: Diagnosis not present

## 2016-05-07 DIAGNOSIS — Z7984 Long term (current) use of oral hypoglycemic drugs: Secondary | ICD-10-CM | POA: Diagnosis not present

## 2016-05-07 DIAGNOSIS — K4090 Unilateral inguinal hernia, without obstruction or gangrene, not specified as recurrent: Secondary | ICD-10-CM | POA: Diagnosis not present

## 2016-05-07 DIAGNOSIS — Z9049 Acquired absence of other specified parts of digestive tract: Secondary | ICD-10-CM | POA: Diagnosis not present

## 2016-05-07 DIAGNOSIS — I10 Essential (primary) hypertension: Secondary | ICD-10-CM | POA: Diagnosis not present

## 2016-05-07 DIAGNOSIS — K219 Gastro-esophageal reflux disease without esophagitis: Secondary | ICD-10-CM | POA: Diagnosis not present

## 2016-05-07 DIAGNOSIS — Z79899 Other long term (current) drug therapy: Secondary | ICD-10-CM | POA: Diagnosis not present

## 2016-05-07 HISTORY — DX: Cardiac murmur, unspecified: R01.1

## 2016-05-07 HISTORY — DX: Nausea with vomiting, unspecified: R11.2

## 2016-05-07 HISTORY — DX: Other specified postprocedural states: Z98.890

## 2016-05-07 LAB — CBC
HCT: 43 % (ref 39.0–52.0)
HEMOGLOBIN: 14.4 g/dL (ref 13.0–17.0)
MCH: 28.6 pg (ref 26.0–34.0)
MCHC: 33.5 g/dL (ref 30.0–36.0)
MCV: 85.5 fL (ref 78.0–100.0)
Platelets: 202 10*3/uL (ref 150–400)
RBC: 5.03 MIL/uL (ref 4.22–5.81)
RDW: 13.2 % (ref 11.5–15.5)
WBC: 5.6 10*3/uL (ref 4.0–10.5)

## 2016-05-07 LAB — BASIC METABOLIC PANEL
ANION GAP: 7 (ref 5–15)
BUN: 22 mg/dL — ABNORMAL HIGH (ref 6–20)
CALCIUM: 9.8 mg/dL (ref 8.9–10.3)
CHLORIDE: 106 mmol/L (ref 101–111)
CO2: 26 mmol/L (ref 22–32)
Creatinine, Ser: 0.88 mg/dL (ref 0.61–1.24)
GFR calc Af Amer: >60 mL/min (ref >60)
GFR calc non Af Amer: >60 mL/min (ref >60)
GLUCOSE: 150 mg/dL — AB (ref 65–99)
Potassium: 4.2 mmol/L (ref 3.5–5.1)
Sodium: 139 mmol/L (ref 135–145)

## 2016-05-07 NOTE — Patient Instructions (Signed)
SIRUS DEPPE  05/07/2016   Your procedure is scheduled on: September 11,2017  Report to [facility] Main  Entrance take [facility]  elevators to 3rd floor to  [place] at  1030 AM.  Call this number if you have problems the morning of surgery [phone]   Remember: ONLY 1 PERSON MAY GO WITH YOU TO SHORT STAY TO GET  READY MORNING OF [place].   Do not eat food or drink liquids :After Midnight.     Take these medicines the morning of surgery with A SIP OF WATER: Prilosec, Amlodipine, use Flonase nasal spray, use Albuterol inhaler and Advair inhaler if needed.              DO NOT TAKE ANY DIABETIC MEDICATIONS DAY OF YOUR SURGERY!                               You may not have any metal on your body including hair pins and              piercings  Do not wear jewelry, make-up, lotions, powders or perfumes, deodorant             Do not wear nail polish.  Do not shave  48 hours prior to surgery.              Men may shave face and neck.   Do not bring valuables to the hospital. [place].  Contacts, dentures or bridgework may not be worn into surgery.  Leave suitcase in the car. After surgery it may be brought to your room.     Patients discharged the day of surgery will not be allowed to drive home.  Name and phone number of your driver:son- Delilah Shan and wife  Special Instructions: N/A              Please read over the following fact sheets you were given: _____________________________________________________________________             [facility] - Preparing for Surgery Before surgery, you can play an important role.  Because skin is not sterile, your skin needs to be as free of germs as possible.  You can reduce the number of germs on your skin by washing with CHG (chlorahexidine gluconate) soap before surgery.  CHG is an antiseptic cleaner which kills germs and bonds with the skin to continue  killing germs even after washing. Please DO NOT use if you have an allergy to CHG or antibacterial soaps.  If your skin becomes reddened/irritated stop using the CHG and inform your nurse when you arrive at Short Stay. Do not shave (including legs and underarms) for at least 48 hours prior to the first CHG shower.  You may shave your face/neck. Please follow these instructions carefully:  1.  Shower with CHG Soap the night before surgery and the  morning of Surgery.  2.  If you choose to wash your hair, wash your hair first as usual with your  normal  shampoo.  3.  After you shampoo, rinse your hair and body thoroughly to remove the  shampoo.  4.  Use CHG as you would any other liquid soap.  You can apply chg directly  to the skin and wash                       Gently with a scrungie or clean washcloth.  5.  Apply the CHG Soap to your body ONLY FROM THE NECK DOWN.   Do not use on face/ open                           Wound or open sores. Avoid contact with eyes, ears mouth and genitals (private parts).                       Wash face,  Genitals (private parts) with your normal soap.             6.  Wash thoroughly, paying special attention to the area where your surgery  will be performed.  7.  Thoroughly rinse your body with warm water from the neck down.  8.  DO NOT shower/wash with your normal soap after using and rinsing off  the CHG Soap.                9.  Pat yourself dry with a clean towel.            10.  Wear clean pajamas.            11.  Place clean sheets on your bed the night of your first shower and do not  sleep with pets. Day of Surgery : Do not apply any lotions/deodorants the morning of surgery.  Please wear clean clothes to the hospital/surgery center.  FAILURE TO FOLLOW THESE INSTRUCTIONS MAY RESULT IN THE CANCELLATION OF YOUR SURGERY PATIENT SIGNATURE_________________________________  NURSE  SIGNATURE__________________________________  ________________________________________________________________________

## 2016-05-08 ENCOUNTER — Ambulatory Visit (HOSPITAL_COMMUNITY): Payer: Medicare Other | Admitting: Anesthesiology

## 2016-05-08 ENCOUNTER — Observation Stay (HOSPITAL_COMMUNITY)
Admission: RE | Admit: 2016-05-08 | Discharge: 2016-05-09 | Disposition: A | Discharge status: Home / Self Care | Payer: Medicare Other | Source: Ambulatory Visit | Attending: Surgery | Admitting: Surgery

## 2016-05-08 ENCOUNTER — Encounter (HOSPITAL_COMMUNITY): Payer: Self-pay | Admitting: *Deleted

## 2016-05-08 ENCOUNTER — Encounter (HOSPITAL_COMMUNITY)
Admission: RE | Disposition: A | Discharge status: Home / Self Care | Payer: Self-pay | Source: Ambulatory Visit | Attending: Surgery

## 2016-05-08 DIAGNOSIS — I10 Essential (primary) hypertension: Secondary | ICD-10-CM | POA: Diagnosis not present

## 2016-05-08 DIAGNOSIS — Z7984 Long term (current) use of oral hypoglycemic drugs: Secondary | ICD-10-CM | POA: Insufficient documentation

## 2016-05-08 DIAGNOSIS — Z79899 Other long term (current) drug therapy: Secondary | ICD-10-CM | POA: Insufficient documentation

## 2016-05-08 DIAGNOSIS — E119 Type 2 diabetes mellitus without complications: Secondary | ICD-10-CM | POA: Diagnosis not present

## 2016-05-08 DIAGNOSIS — Z9049 Acquired absence of other specified parts of digestive tract: Secondary | ICD-10-CM | POA: Insufficient documentation

## 2016-05-08 DIAGNOSIS — K4041 Unilateral inguinal hernia, with gangrene, recurrent: Secondary | ICD-10-CM | POA: Diagnosis not present

## 2016-05-08 DIAGNOSIS — K219 Gastro-esophageal reflux disease without esophagitis: Secondary | ICD-10-CM | POA: Insufficient documentation

## 2016-05-08 DIAGNOSIS — K4090 Unilateral inguinal hernia, without obstruction or gangrene, not specified as recurrent: Secondary | ICD-10-CM | POA: Diagnosis not present

## 2016-05-08 DIAGNOSIS — K4091 Unilateral inguinal hernia, without obstruction or gangrene, recurrent: Secondary | ICD-10-CM | POA: Diagnosis not present

## 2016-05-08 HISTORY — PX: XI ROBOTIC ASSISTED INGUINAL HERNIA REPAIR WITH MESH: SHX6706

## 2016-05-08 HISTORY — DX: Unilateral inguinal hernia, without obstruction or gangrene, not specified as recurrent: K40.90

## 2016-05-08 LAB — GLUCOSE, CAPILLARY
GLUCOSE-CAPILLARY: 100 mg/dL — AB (ref 65–99)
GLUCOSE-CAPILLARY: 162 mg/dL — AB (ref 65–99)
Glucose-Capillary: 154 mg/dL — ABNORMAL HIGH (ref 65–99)

## 2016-05-08 LAB — HEMOGLOBIN A1C
Hgb A1c MFr Bld: 6.1 % — ABNORMAL HIGH (ref 4.8–5.6)
Mean Plasma Glucose: 128 mg/dL

## 2016-05-08 SURGERY — REPAIR, HERNIA, INGUINAL, ROBOT-ASSISTED, LAPAROSCOPIC, USING MESH
Anesthesia: General | Laterality: Right

## 2016-05-08 MED ORDER — HYDROCODONE-ACETAMINOPHEN 5-325 MG PO TABS
1.0000 | ORAL_TABLET | ORAL | Status: DC | PRN
Start: 2016-05-08 — End: 2016-05-09
  Administered 2016-05-08 – 2016-05-09 (×2): 1 via ORAL
  Filled 2016-05-08 (×2): qty 1

## 2016-05-08 MED ORDER — INSULIN ASPART 100 UNIT/ML ~~LOC~~ SOLN
0.0000 [IU] | SUBCUTANEOUS | Status: DC
  Administered 2016-05-08 – 2016-05-09 (×4): 3 [IU] via SUBCUTANEOUS
  Administered 2016-05-09: 2 [IU] via SUBCUTANEOUS

## 2016-05-08 MED ORDER — AMLODIPINE BESYLATE 10 MG PO TABS: 10.0000 mg | ORAL_TABLET | Freq: Every morning | ORAL | Status: DC

## 2016-05-08 MED ORDER — FENTANYL CITRATE (PF) 100 MCG/2ML IJ SOLN
25.0000 ug | INTRAMUSCULAR | Status: DC | PRN
  Administered 2016-05-08 (×2): 50 ug via INTRAVENOUS

## 2016-05-08 MED ORDER — FENTANYL CITRATE (PF) 100 MCG/2ML IJ SOLN
INTRAMUSCULAR | Status: AC
  Filled 2016-05-08: qty 2

## 2016-05-08 MED ORDER — HEPARIN SODIUM (PORCINE) 5000 UNIT/ML IJ SOLN
5000.0000 [IU] | Freq: Once | INTRAMUSCULAR | Status: AC
  Administered 2016-05-08: 5000 [IU] via SUBCUTANEOUS
  Filled 2016-05-08: qty 1

## 2016-05-08 MED ORDER — KCL IN DEXTROSE-NACL 20-5-0.45 MEQ/L-%-% IV SOLN
INTRAVENOUS | Status: DC
  Administered 2016-05-08: 18:00:00 via INTRAVENOUS
  Filled 2016-05-08 (×2): qty 1000

## 2016-05-08 MED ORDER — CIPROFLOXACIN IN D5W 400 MG/200ML IV SOLN
INTRAVENOUS | Status: AC
  Filled 2016-05-08: qty 200

## 2016-05-08 MED ORDER — MIDAZOLAM HCL 5 MG/5ML IJ SOLN
INTRAMUSCULAR | Status: DC | PRN
  Administered 2016-05-08: 0.5 mg via INTRAVENOUS

## 2016-05-08 MED ORDER — SODIUM CHLORIDE 0.9 % IV SOLN
INTRAVENOUS | Status: DC | PRN
  Administered 2016-05-08: 25 ug/min via INTRAVENOUS

## 2016-05-08 MED ORDER — PROPOFOL 10 MG/ML IV BOLUS
INTRAVENOUS | Status: AC
Start: 2016-05-08 — End: 2016-05-08
  Filled 2016-05-08: qty 20

## 2016-05-08 MED ORDER — SODIUM CHLORIDE 0.9 % IJ SOLN
INTRAMUSCULAR | Status: AC
  Filled 2016-05-08: qty 10

## 2016-05-08 MED ORDER — TAMSULOSIN HCL 0.4 MG PO CAPS
0.4000 mg | ORAL_CAPSULE | Freq: Every day | ORAL | Status: DC
  Administered 2016-05-08: 0.4 mg via ORAL
  Filled 2016-05-08: qty 1

## 2016-05-08 MED ORDER — PROPOFOL 10 MG/ML IV BOLUS
INTRAVENOUS | Status: DC | PRN
  Administered 2016-05-08: 150 mg via INTRAVENOUS

## 2016-05-08 MED ORDER — DIPHENHYDRAMINE HCL 50 MG/ML IJ SOLN: 12.5000 mg | Freq: Four times a day (QID) | INTRAMUSCULAR | Status: DC | PRN

## 2016-05-08 MED ORDER — HYDROCODONE-ACETAMINOPHEN 5-325 MG PO TABS: 1.0000 | ORAL_TABLET | ORAL | Status: DC | PRN

## 2016-05-08 MED ORDER — BUPIVACAINE LIPOSOME 1.3 % IJ SUSP
20.0000 mL | Freq: Once | INTRAMUSCULAR | Status: AC
  Administered 2016-05-08: 20 mL
  Filled 2016-05-08: qty 20

## 2016-05-08 MED ORDER — CIPROFLOXACIN IN D5W 400 MG/200ML IV SOLN
400.0000 mg | INTRAVENOUS | Status: AC
  Administered 2016-05-08: 400 mg via INTRAVENOUS

## 2016-05-08 MED ORDER — LACTATED RINGERS IV SOLN
INTRAVENOUS | Status: DC
  Administered 2016-05-08 (×2): via INTRAVENOUS

## 2016-05-08 MED ORDER — LOSARTAN POTASSIUM 50 MG PO TABS
100.0000 mg | ORAL_TABLET | Freq: Every morning | ORAL | Status: DC
Start: 2016-05-09 — End: 2016-05-09

## 2016-05-08 MED ORDER — EPHEDRINE SULFATE 50 MG/ML IJ SOLN
INTRAMUSCULAR | Status: DC | PRN
  Administered 2016-05-08 (×3): 10 mg via INTRAVENOUS

## 2016-05-08 MED ORDER — FENTANYL CITRATE (PF) 100 MCG/2ML IJ SOLN
INTRAMUSCULAR | Status: DC | PRN
  Administered 2016-05-08 (×4): 50 ug via INTRAVENOUS

## 2016-05-08 MED ORDER — FESOTERODINE FUMARATE ER 4 MG PO TB24
4.0000 mg | ORAL_TABLET | Freq: Every day | ORAL | Status: DC
  Administered 2016-05-08: 4 mg via ORAL
  Filled 2016-05-08 (×3): qty 1

## 2016-05-08 MED ORDER — PROMETHAZINE HCL 25 MG/ML IJ SOLN: 6.2500 mg | INTRAMUSCULAR | Status: DC | PRN

## 2016-05-08 MED ORDER — SUGAMMADEX SODIUM 200 MG/2ML IV SOLN
INTRAVENOUS | Status: DC | PRN
  Administered 2016-05-08: 200 mg via INTRAVENOUS

## 2016-05-08 MED ORDER — FENTANYL CITRATE (PF) 100 MCG/2ML IJ SOLN
INTRAMUSCULAR | Status: AC
  Filled 2016-05-08: qty 4

## 2016-05-08 MED ORDER — MOMETASONE FURO-FORMOTEROL FUM 200-5 MCG/ACT IN AERO
2.0000 | INHALATION_SPRAY | Freq: Two times a day (BID) | RESPIRATORY_TRACT | Status: DC
  Administered 2016-05-08 – 2016-05-09 (×2): 2 via RESPIRATORY_TRACT
  Filled 2016-05-08: qty 8.8

## 2016-05-08 MED ORDER — SUGAMMADEX SODIUM 200 MG/2ML IV SOLN
INTRAVENOUS | Status: AC
  Filled 2016-05-08: qty 2

## 2016-05-08 MED ORDER — ONDANSETRON HCL 4 MG/2ML IJ SOLN
4.0000 mg | Freq: Four times a day (QID) | INTRAMUSCULAR | Status: DC | PRN
  Administered 2016-05-08: 4 mg via INTRAVENOUS
  Filled 2016-05-08: qty 2

## 2016-05-08 MED ORDER — PANTOPRAZOLE SODIUM 40 MG PO TBEC: 40.0000 mg | DELAYED_RELEASE_TABLET | Freq: Every day | ORAL | Status: DC | PRN

## 2016-05-08 MED ORDER — SUCCINYLCHOLINE CHLORIDE 20 MG/ML IJ SOLN
INTRAMUSCULAR | Status: DC | PRN
  Administered 2016-05-08: 100 mg via INTRAVENOUS

## 2016-05-08 MED ORDER — LIDOCAINE HCL (CARDIAC) 20 MG/ML IV SOLN
INTRAVENOUS | Status: DC | PRN
  Administered 2016-05-08: 100 mg via INTRAVENOUS

## 2016-05-08 MED ORDER — FAMOTIDINE IN NACL 20-0.9 MG/50ML-% IV SOLN
20.0000 mg | Freq: Two times a day (BID) | INTRAVENOUS | Status: DC
  Administered 2016-05-08: 20 mg via INTRAVENOUS
  Filled 2016-05-08 (×2): qty 50

## 2016-05-08 MED ORDER — ONDANSETRON HCL 4 MG/2ML IJ SOLN
INTRAMUSCULAR | Status: AC
  Filled 2016-05-08: qty 2

## 2016-05-08 MED ORDER — PHENYLEPHRINE HCL 10 MG/ML IJ SOLN
INTRAMUSCULAR | Status: AC
  Filled 2016-05-08: qty 1

## 2016-05-08 MED ORDER — ONDANSETRON HCL 4 MG/2ML IJ SOLN
INTRAMUSCULAR | Status: DC | PRN
  Administered 2016-05-08: 4 mg via INTRAVENOUS

## 2016-05-08 MED ORDER — PROPOFOL 10 MG/ML IV BOLUS
INTRAVENOUS | Status: AC
  Filled 2016-05-08: qty 20

## 2016-05-08 MED ORDER — MIDAZOLAM HCL 2 MG/2ML IJ SOLN
INTRAMUSCULAR | Status: AC
  Filled 2016-05-08: qty 2

## 2016-05-08 MED ORDER — 0.9 % SODIUM CHLORIDE (POUR BTL) OPTIME
TOPICAL | Status: DC | PRN
  Administered 2016-05-08: 1000 mL

## 2016-05-08 MED ORDER — ROCURONIUM BROMIDE 100 MG/10ML IV SOLN
INTRAVENOUS | Status: DC | PRN
  Administered 2016-05-08: 10 mg via INTRAVENOUS
  Administered 2016-05-08: 40 mg via INTRAVENOUS
  Administered 2016-05-08 (×5): 10 mg via INTRAVENOUS

## 2016-05-08 MED ORDER — KCL IN DEXTROSE-NACL 20-5-0.45 MEQ/L-%-% IV SOLN
INTRAVENOUS | Status: AC
  Filled 2016-05-08: qty 1000

## 2016-05-08 MED ORDER — FENTANYL CITRATE (PF) 100 MCG/2ML IJ SOLN: 12.5000 ug | INTRAMUSCULAR | Status: DC | PRN

## 2016-05-08 MED ORDER — OMEPRAZOLE MAGNESIUM 20 MG PO TBEC: 20.0000 mg | DELAYED_RELEASE_TABLET | Freq: Every day | ORAL | Status: DC | PRN

## 2016-05-08 MED ORDER — EPHEDRINE 5 MG/ML INJ
INTRAVENOUS | Status: AC
  Filled 2016-05-08: qty 10

## 2016-05-08 MED ORDER — DEXAMETHASONE SODIUM PHOSPHATE 10 MG/ML IJ SOLN
INTRAMUSCULAR | Status: DC | PRN
  Administered 2016-05-08: 10 mg via INTRAVENOUS

## 2016-05-08 MED ORDER — ONDANSETRON 4 MG PO TBDP: 4.0000 mg | ORAL_TABLET | Freq: Four times a day (QID) | ORAL | Status: DC | PRN

## 2016-05-08 MED ORDER — LIDOCAINE 2% (20 MG/ML) 5 ML SYRINGE
INTRAMUSCULAR | Status: AC
  Filled 2016-05-08: qty 5

## 2016-05-08 MED ORDER — HEPARIN SODIUM (PORCINE) 5000 UNIT/ML IJ SOLN
5000.0000 [IU] | Freq: Three times a day (TID) | INTRAMUSCULAR | Status: DC
  Administered 2016-05-09: 5000 [IU] via SUBCUTANEOUS
  Filled 2016-05-08: qty 1

## 2016-05-08 MED ORDER — LEVOTHYROXINE SODIUM 25 MCG PO TABS
25.0000 ug | ORAL_TABLET | ORAL | Status: DC
  Administered 2016-05-09: 25 ug via ORAL
  Filled 2016-05-08: qty 1

## 2016-05-08 MED ORDER — DEXAMETHASONE SODIUM PHOSPHATE 10 MG/ML IJ SOLN
INTRAMUSCULAR | Status: AC
  Filled 2016-05-08: qty 1

## 2016-05-08 MED ORDER — DIPHENHYDRAMINE HCL 12.5 MG/5ML PO ELIX: 12.5000 mg | ORAL_SOLUTION | Freq: Four times a day (QID) | ORAL | Status: DC | PRN

## 2016-05-08 MED ORDER — ALBUTEROL SULFATE (2.5 MG/3ML) 0.083% IN NEBU
3.0000 mL | INHALATION_SOLUTION | Freq: Four times a day (QID) | RESPIRATORY_TRACT | Status: DC | PRN
Start: 2016-05-08 — End: 2016-05-09

## 2016-05-08 MED ORDER — SODIUM CHLORIDE 0.9 % IJ SOLN
INTRAMUSCULAR | Status: DC | PRN
  Administered 2016-05-08: 10 mL via INTRAVENOUS

## 2016-05-08 MED ORDER — BISOPROLOL FUMARATE 10 MG PO TABS
10.0000 mg | ORAL_TABLET | Freq: Once | ORAL | Status: AC
  Administered 2016-05-08: 10 mg via ORAL
  Filled 2016-05-08: qty 1

## 2016-05-08 MED ORDER — FLUTICASONE PROPIONATE 50 MCG/ACT NA SUSP
2.0000 | Freq: Every day | NASAL | Status: DC
  Filled 2016-05-08: qty 16

## 2016-05-08 SURGICAL SUPPLY — 45 items
APL SKNCLS STERI-STRIP NONHPOA (GAUZE/BANDAGES/DRESSINGS)
APPLICATOR COTTON TIP 6IN STRL (MISCELLANEOUS) IMPLANT
BENZOIN TINCTURE PRP APPL 2/3 (GAUZE/BANDAGES/DRESSINGS) IMPLANT
BLADE SURG 15 STRL LF DISP TIS (BLADE) ×1 IMPLANT
BLADE SURG 15 STRL SS (BLADE) ×3
CLOSURE WOUND 1/2 X4 (GAUZE/BANDAGES/DRESSINGS)
COVER TIP SHEARS 8 DVNC (MISCELLANEOUS) ×1 IMPLANT
COVER TIP SHEARS 8MM DA VINCI (MISCELLANEOUS) ×2
DECANTER SPIKE VIAL GLASS SM (MISCELLANEOUS) ×3 IMPLANT
DRAPE ARM DVNC X/XI (DISPOSABLE) ×4 IMPLANT
DRAPE COLUMN DVNC XI (DISPOSABLE) ×1 IMPLANT
DRAPE DA VINCI XI ARM (DISPOSABLE) ×8
DRAPE DA VINCI XI COLUMN (DISPOSABLE) ×2
ELECT REM PT RETURN 15FT ADLT (MISCELLANEOUS) ×3 IMPLANT
GLOVE BIOGEL M 8.0 STRL (GLOVE) ×6 IMPLANT
GOWN STRL REUS W/TWL XL LVL3 (GOWN DISPOSABLE) ×12 IMPLANT
IRRIG SUCT STRYKERFLOW 2 WTIP (MISCELLANEOUS) ×3
IRRIGATION SUCT STRKRFLW 2 WTP (MISCELLANEOUS) ×1 IMPLANT
KIT BASIN OR (CUSTOM PROCEDURE TRAY) ×3 IMPLANT
LIQUID BAND (GAUZE/BANDAGES/DRESSINGS) ×3 IMPLANT
MESH 3DMAX LIGHT 4.1X6.2 RT LR (Mesh General) ×3 IMPLANT
NEEDLE HYPO 22GX1.5 SAFETY (NEEDLE) ×3 IMPLANT
PACK CARDIOVASCULAR III (CUSTOM PROCEDURE TRAY) ×3 IMPLANT
PAD POSITIONING PINK XL (MISCELLANEOUS) ×3 IMPLANT
SCISSORS LAP 5X35 DISP (ENDOMECHANICALS) ×3 IMPLANT
SEAL CANN UNIV 5-8 DVNC XI (MISCELLANEOUS) ×4 IMPLANT
SEAL XI 5MM-8MM UNIVERSAL (MISCELLANEOUS) ×8
SET BI-LUMEN FLTR TB AIRSEAL (TUBING) ×3 IMPLANT
SOLUTION ANTI FOG 6CC (MISCELLANEOUS) ×3 IMPLANT
SOLUTION ELECTROLUBE (MISCELLANEOUS) ×3 IMPLANT
STAPLER VISISTAT 35W (STAPLE) IMPLANT
STRIP CLOSURE SKIN 1/2X4 (GAUZE/BANDAGES/DRESSINGS) IMPLANT
SUT MNCRL AB 4-0 PS2 18 (SUTURE) ×3 IMPLANT
SUT V-LOC BARB 180 2/0GR6 GS22 (SUTURE) ×3
SUT VIC AB 2-0 SH 27 (SUTURE) ×3
SUT VIC AB 2-0 SH 27X BRD (SUTURE) ×1 IMPLANT
SUT VIC AB 3-0 SH 27 (SUTURE)
SUT VIC AB 3-0 SH 27XBRD (SUTURE) IMPLANT
SUT VLOC 180 2-0 9IN GS21 (SUTURE) ×6 IMPLANT
SUTURE V-LC BRB 180 2/0GR6GS22 (SUTURE) ×1 IMPLANT
SYR 20CC LL (SYRINGE) ×3 IMPLANT
TOWEL OR 17X26 10 PK STRL BLUE (TOWEL DISPOSABLE) ×3 IMPLANT
TOWEL OR NON WOVEN STRL DISP B (DISPOSABLE) ×3 IMPLANT
TRAY FOLEY CATH 16FRSI W/METER (SET/KITS/TRAYS/PACK) ×3 IMPLANT
TROCAR XCEL NON-BLD 5MMX100MML (ENDOMECHANICALS) ×3 IMPLANT

## 2016-05-08 NOTE — Anesthesia Procedure Notes (Signed)
Procedure Name: Intubation Date/Time: 05/08/2016 12:25 PM Performed by: Maxwell Caul Pre-anesthesia Checklist: Patient identified, Emergency Drugs available, Suction available and Patient being monitored Patient Re-evaluated:Patient Re-evaluated prior to inductionOxygen Delivery Method: Circle system utilized Preoxygenation: Pre-oxygenation with 100% oxygen Intubation Type: IV induction Ventilation: Mask ventilation without difficulty Laryngoscope Size: Mac and 4 Grade View: Grade I Tube type: Oral Tube size: 7.5 mm Number of attempts: 1 Airway Equipment and Method: Stylet Placement Confirmation: ETT inserted through vocal cords under direct vision,  positive ETCO2 and breath sounds checked- equal and bilateral Secured at: 21 cm Tube secured with: Tape Dental Injury: Teeth and Oropharynx as per pre-operative assessment

## 2016-05-08 NOTE — Brief Op Note (Signed)
05/08/2016  3:57 PM  PATIENT:  Bruce Nunez  75 y.o. male  PRE-OPERATIVE DIAGNOSIS:  Right inguinal hernia pain  POST-OPERATIVE DIAGNOSIS:  right inguinal hernia pain  PROCEDURE:  Procedure(s): XI ROBOTIC ASSISTED REPAIR OF RECURRENT RIGHT INGUINAL HERNIA (Right)  SURGEON:  Surgeon(s) and Role:    * Johnathan Hausen, MD - Primary    * Greer Pickerel, MD - Assisting  PHYSICIAN ASSISTANT:   ASSISTANTS: Greer Pickerel   ANESTHESIA:   general  EBL:  Total I/O In: 1700 [I.V.:1700] Out: 220 [Urine:200; Blood:20]  BLOOD ADMINISTERED:none  DRAINS: none   LOCAL MEDICATIONS USED:  BUPIVICAINE   SPECIMEN:  No Specimen  DISPOSITION OF SPECIMEN:  N/A  COUNTS:  YES  TOURNIQUET:  * No tourniquets in log *  DICTATION: .Other Dictation: Dictation Number [id number]  PLAN OF CARE: Admit for overnight observation  PATIENT DISPOSITION:  PACU - hemodynamically stable.   Delay start of Pharmacological VTE agent (>24hrs) due to surgical blood loss or risk of bleeding: yes

## 2016-05-08 NOTE — Anesthesia Postprocedure Evaluation (Signed)
Anesthesia Post Note  Patient: Bruce Nunez  Procedure(s) Performed: Procedure(s) (LRB): XI ROBOTIC ASSISTED REPAIR OF RECURRENT RIGHT INGUINAL HERNIA (Right)  Patient location during evaluation: PACU Anesthesia Type: General Level of consciousness: awake and alert Pain management: pain level controlled Vital Signs Assessment: post-procedure vital signs reviewed and stable Respiratory status: spontaneous breathing, nonlabored ventilation and respiratory function stable Cardiovascular status: blood pressure returned to baseline and stable Postop Assessment: no signs of nausea or vomiting Anesthetic complications: no    Last Vitals:  Vitals:   05/08/16 1654 05/08/16 1707  BP:  (!) 151/71  Pulse: 84   Resp: 12 14  Temp:  36.6 C    Last Pain:  Vitals:   05/08/16 1710  TempSrc:   PainSc: 0-No pain                 Nilda Simmer

## 2016-05-08 NOTE — Anesthesia Preprocedure Evaluation (Signed)
Anesthesia Evaluation  Patient identified by MRN, date of birth, ID band Patient awake    Reviewed: Allergy & Precautions, H&P , NPO status , Patient's Chart, lab work & pertinent test results  History of Anesthesia Complications Negative for: history of anesthetic complications  Airway Mallampati: II  TM Distance: >3 FB Neck ROM: Full    Dental no notable dental hx.    Pulmonary neg pulmonary ROS,    Pulmonary exam normal breath sounds clear to auscultation       Cardiovascular hypertension, Pt. on medications and Pt. on home beta blockers Normal cardiovascular exam Rhythm:Regular Rate:Normal     Neuro/Psych negative neurological ROS  negative psych ROS   GI/Hepatic Neg liver ROS, GERD  Medicated and Controlled,  Endo/Other  diabetes, Type 2, Oral Hypoglycemic Agents  Renal/GU negative Renal ROS  negative genitourinary   Musculoskeletal negative musculoskeletal ROS (+)   Abdominal   Peds negative pediatric ROS (+)  Hematology negative hematology ROS (+)   Anesthesia Other Findings   Reproductive/Obstetrics negative OB ROS                             Anesthesia Physical  Anesthesia Plan  ASA: II  Anesthesia Plan: General   Post-op Pain Management:    Induction: Intravenous  Airway Management Planned: Oral ETT  Additional Equipment:   Intra-op Plan: Utilization of Controlled Hypotension per surrgeon request  Post-operative Plan: Extubation in OR  Informed Consent: I have reviewed the patients History and Physical, chart, labs and discussed the procedure including the risks, benefits and alternatives for the proposed anesthesia with the patient or authorized representative who has indicated his/her understanding and acceptance.   Dental advisory given  Plan Discussed with: CRNA  Anesthesia Plan Comments:         Anesthesia Quick Evaluation

## 2016-05-08 NOTE — Transfer of Care (Signed)
Immediate Anesthesia Transfer of Care Note  Patient: Bruce Nunez  Procedure(s) Performed: Procedure(s): XI ROBOTIC ASSISTED REPAIR OF RECURRENT RIGHT INGUINAL HERNIA (Right)  Patient Location: PACU  Anesthesia Type:General  Level of Consciousness:  sedated, patient cooperative and responds to stimulation  Airway & Oxygen Therapy:Patient Spontanous Breathing and Patient connected to face mask oxgen  Post-op Assessment:  Report given to PACU RN and Post -op Vital signs reviewed and stable  Post vital signs:  Reviewed and stable  Last Vitals:  Vitals:   05/08/16 1014  BP: (!) 155/64  Pulse: 73  Resp: 18  Temp: [id number] C    Complications: No apparent anesthesia complications

## 2016-05-09 DIAGNOSIS — K219 Gastro-esophageal reflux disease without esophagitis: Secondary | ICD-10-CM | POA: Diagnosis not present

## 2016-05-09 DIAGNOSIS — Z79899 Other long term (current) drug therapy: Secondary | ICD-10-CM | POA: Diagnosis not present

## 2016-05-09 DIAGNOSIS — E119 Type 2 diabetes mellitus without complications: Secondary | ICD-10-CM | POA: Diagnosis not present

## 2016-05-09 DIAGNOSIS — I10 Essential (primary) hypertension: Secondary | ICD-10-CM | POA: Diagnosis not present

## 2016-05-09 DIAGNOSIS — K4090 Unilateral inguinal hernia, without obstruction or gangrene, not specified as recurrent: Secondary | ICD-10-CM | POA: Diagnosis not present

## 2016-05-09 DIAGNOSIS — Z7984 Long term (current) use of oral hypoglycemic drugs: Secondary | ICD-10-CM | POA: Diagnosis not present

## 2016-05-09 LAB — CBC
HEMATOCRIT: 39.5 % (ref 39.0–52.0)
Hemoglobin: 13.8 g/dL (ref 13.0–17.0)
MCH: 29.3 pg (ref 26.0–34.0)
MCHC: 34.9 g/dL (ref 30.0–36.0)
MCV: 83.9 fL (ref 78.0–100.0)
Platelets: 193 10*3/uL (ref 150–400)
RBC: 4.71 MIL/uL (ref 4.22–5.81)
RDW: 13 % (ref 11.5–15.5)
WBC: 8 10*3/uL (ref 4.0–10.5)

## 2016-05-09 LAB — GLUCOSE, CAPILLARY
GLUCOSE-CAPILLARY: 129 mg/dL — AB (ref 65–99)
GLUCOSE-CAPILLARY: 199 mg/dL — AB (ref 65–99)
Glucose-Capillary: 166 mg/dL — ABNORMAL HIGH (ref 65–99)

## 2016-05-09 MED ORDER — HYDROCODONE-ACETAMINOPHEN 5-325 MG PO TABS: 1.0000 | ORAL_TABLET | ORAL | 0 refills | Status: DC | PRN

## 2016-05-09 NOTE — Discharge Summary (Signed)
Physician Discharge Summary  Patient ID: Bruce Nunez MRN: [id number] DOB/AGE: 76-Dec-1942 76 y.o.  Admit date: 05/08/2016 Discharge date: 05/09/2016  Admission Diagnoses:  Recurrent right inguinal hernia  Discharge Diagnoses:  Recurrent right indirect inguinal hernia  Active Problems:   Right inguinal hernia   Surgery:  Xi Robotic Right TAPP with enterolysis  Discharged Condition: improved  Hospital Course:   Had robotic TAPP repair of recurrent RIH (indirect) containing fat.  Kept overnight and did well.  No pain.  Discharged home to return to home meds.  Consults: none  Significant Diagnostic Studies: none    Discharge Exam: Blood pressure (!) 148/68, pulse 90, temperature 98.2 F (36.8 C), temperature source Oral, resp. rate 18, height 5\' 7"  (1.702 m), weight 73 kg (161 lb), SpO2 97 %. Incisions bland  Disposition: 01-Home or Self Care  Discharge Instructions    Diet - low sodium heart healthy    Complete by:  As directed    Discharge wound care:    Complete by:  As directed    May shower and get incisions wet   Increase activity slowly    Complete by:  As directed        Medication List    TAKE these medications   acetaminophen 500 MG tablet Commonly known as:  TYLENOL Take 500 mg by mouth every 6 (six) hours as needed for mild pain.   albuterol 108 (90 Base) MCG/ACT inhaler Commonly known as:  PROVENTIL HFA;VENTOLIN HFA Inhale 2 puffs into the lungs every 6 (six) hours as needed for wheezing.   amLODipine 10 MG tablet Commonly known as:  NORVASC Take 10 mg by mouth every morning.   bisoprolol-hydrochlorothiazide 10-6.25 MG tablet Commonly known as:  ZIAC Take 1 tablet by mouth every morning.   fexofenadine 180 MG tablet Commonly known as:  ALLEGRA Take 180 mg by mouth daily.   fluticasone 50 MCG/ACT nasal spray Commonly known as:  FLONASE Place 2 sprays into both nostrils daily.   fluticasone-salmeterol 115-21 MCG/ACT inhaler Commonly known  as:  ADVAIR HFA Inhale 2 puffs into the lungs 2 (two) times daily.   HYDROcodone-acetaminophen 5-325 MG tablet Commonly known as:  NORCO Take 1 tablet by mouth every 4 (four) hours as needed for moderate pain. What changed:  Another medication with the same name was added. Make sure you understand how and when to take each.   HYDROcodone-acetaminophen 5-325 MG tablet Commonly known as:  NORCO/VICODIN Take 1-2 tablets by mouth every 4 (four) hours as needed for moderate pain. What changed:  You were already taking a medication with the same name, and this prescription was added. Make sure you understand how and when to take each.   levothyroxine 25 MCG tablet Commonly known as:  SYNTHROID, LEVOTHROID Take 25 mcg by mouth See admin instructions. Takes every 2 days   losartan 100 MG tablet Commonly known as:  COZAAR Take 100 mg by mouth every morning.   metFORMIN 500 MG tablet Commonly known as:  GLUCOPHAGE Take 500 mg by mouth 2 (two) times daily with a meal.   omeprazole 20 MG tablet Commonly known as:  PRILOSEC OTC Take 20 mg by mouth daily as needed.   pravastatin 10 MG tablet Commonly known as:  PRAVACHOL Take 10 mg by mouth daily.   tamsulosin 0.4 MG Caps capsule Commonly known as:  FLOMAX Take 0.4 mg by mouth daily.   TOVIAZ 4 MG Tb24 tablet Generic drug:  fesoterodine Take 4 mg by mouth daily.  Follow-up Information    Dinna Severs B, MD. Schedule an appointment as soon as possible for a visit in 3 week(s).   Specialty:  General Surgery Contact information: [address] [place] [address] [phone]           Signed: Pedro Earls 05/09/2016, 9:09 AM

## 2016-05-09 NOTE — Op Note (Signed)
NAMERIKU, YOHEY NO.:  [id number]  MEDICAL RECORD NO.:  [id number]  LOCATION:  [place]                         FACILITY:  [facility]  PHYSICIAN:  Isabel Caprice. Hassell Done, MD  DATE OF BIRTH:  04-16-1941  DATE OF PROCEDURE:  05/08/2016 DATE OF DISCHARGE:                              OPERATIVE REPORT   PREOPERATIVE DIAGNOSIS:  A 75 year old male with a recurrent right inguinal hernia, done open.  PROCEDURE:  [employer] Vinc[employer] Mechele Claude robotic right TAPP.  SURGEON:  Isabel Caprice. Hassell Done, MD.  ASSISTANT:  Dr. Greer Pickerel.  DESCRIPTION OF PROCEDURE:  The patient was taken to room 1, given general anesthesia.  After prepping, draping, and a time out, the abdomen was entered through the left upper quadrant using a 5 mm Optiview without difficulty.  The camera was inserted, and he had numerous adhesions to his transverse incision.  This was a supraumbilical transverse incision from his prior right hemicolectomy that I had performed in 2005.  These were taken down sharply with the laparoscopic camera, and we changed ports to change our vantage point. Eventually, all these were taken down as well as down the lower midline.  With him in the Trendelenburg position, I completed my 2 other ports for the robot.  There were therefore three 8 mm ports; 1 on the left, 1 on the midline about 3 fingerbreadths above the umbilicus, and 1 on the right side a little bit higher.  After placement of both the scopes in, I made sure they were in the proper depth into the abdominal wall where the black lines and the sweet spots were located.  The Xi robot was then brought in and docked.  It was docked with the camera in port 3 and with ports 4 and 2 with working arms and the first port was not docked.  It was brought in from the patient's right.  Dr. Redmond Pulling worked out of the 5 mm assistant port in the left upper quadrant.  Prior to docking the robot, I had actually placed marks on the peritoneum about couple  fingerbreadths above the anterior superior iliac spine going toward the midline.  Once those docked with the robot, I had the hot scissors in my right hand and fenestrated grasper in the left. I created a peritoneal flap by incising across from lateral to medial. I then worked my way down into the pelvis.  As I did this, I had minimal bleeding and eventually came across this fatty mass.  Some of this I noticed on the outside that had been stuck and I pulled away part of it. I then dissected free the rest of this, which appeared to be fat that had herniated up into a little recurrent indirect hernia.  On his previous hernia repair, it was a very large indirect inguinal hernia with ___herniated fat_____in place.  I dissected the cord structures down and removed and took this back down to where it was freed from the flaps as well.  I swept this over medially to the pubis and laterally.  I used a large piece of 3DMax mesh, was able to insert that through the 8 mm port, and then brought  in a 12 inch 2-0 Vicryl on an SH needle.  With that, I tacked the mesh medially along the pubis, anteriorly, superiorly above the defect, and laterally.  This has obliterated the defect.  I had a nice ____lie______ to it.  I then closed the peritoneal defect with a running 2-0 V-Loc absorbable from lateral to medial.  There were areas where the peritoneum got very attenuated and ended up recruiting some fat from the midline and tucked that into reinforce this.  I used a 2nd V-Loc from medial to lateral, and I actually tied them together before completing the closure.  Everything appeared to be in order.  I lowered the pressure to 6 when I was completing the closure.  Sponge and needle counts were correct and all needles were removed.  We then undocked the robot after removing the instruments.  The port sites were injected with Exparel and were closed with 4-0 Vicryl and LiquiBand.  The patient seemed to  tolerate the procedure well and was taken to the recovery room in satisfactory condition to be admitted for overnight observation.     Isabel Caprice Hassell Done, MD     MBM/MEDQ  D:  05/08/2016  T:  05/09/2016  Job:  [id number]

## 2016-05-09 NOTE — Care Management Obs Status (Signed)
[place] NOTIFICATION   Patient Details  Name: Bruce Nunez MRN: [id number] Date of Birth: [birth year]   Medicare Observation Status Notification Given:  Yes    Leeroy Cha, RN 05/09/2016, 9:30 AM

## 2016-05-09 NOTE — Discharge Instructions (Signed)
Inguinal Hernia, Adult , Care After Refer to this sheet in the next few weeks. These discharge instructions provide you with general information on caring for yourself after you leave the hospital. Your caregiver may also give you specific instructions. Your treatment has been planned according to the most current medical practices available, but unavoidable complications sometimes occur. If you have any problems or questions after discharge, please call your caregiver. HOME CARE INSTRUCTIONS  Put ice on the operative site.  Put ice in a plastic bag.  Place a towel between your skin and the bag.  Leave the ice on for 15-20 minutes at a time, 03-04 times a day while awake.  Change bandages (dressings) as directed.  Keep the wound dry and clean. The wound may be washed gently with soap and water. Gently blot or dab the wound dry. It is okay to take showers 24 to 48 hours after surgery. Do not take baths, use swimming pools, or use hot tubs for 10 days, or as directed by your caregiver.  Only take over-the-counter or prescription medicines for pain, discomfort, or fever as directed by your caregiver.  Continue your normal diet as directed.  Do not lift anything more than 10 pounds or play contact sports for 3 weeks, or as directed. SEEK MEDICAL CARE IF:  There is redness, swelling, or increasing pain in the wound.  There is fluid (pus) coming from the wound.  There is drainage from a wound lasting longer than 1 day.  You have an oral temperature above 102 F (38.9 C).  You notice a bad smell coming from the wound or dressing.  The wound breaks open after the stitches (sutures) have been removed.  You notice increasing pain in the shoulders (shoulder strap areas).  You develop dizzy episodes or fainting while standing.  You feel sick to your stomach (nauseous) or throw up (vomit). SEEK IMMEDIATE MEDICAL CARE IF:  You develop a rash.  You have difficulty breathing.  You  develop a reaction or have side effects to medicines you were given. MAKE SURE YOU:   Understand these instructions.  Will watch your condition.  Will get help right away if you are not doing well or get worse.   This information is not intended to replace advice given to you by your health care provider. Make sure you discuss any questions you have with your health care provider.   Document Released: 09/13/2006 Document Revised: 09/03/2014 Document Reviewed: 02/14/2015 Elsevier Interactive Patient Education [employer].

## 2016-06-08 DIAGNOSIS — Z8546 Personal history of malignant neoplasm of prostate: Secondary | ICD-10-CM | POA: Diagnosis not present

## 2016-06-12 DIAGNOSIS — Z23 Encounter for immunization: Secondary | ICD-10-CM | POA: Diagnosis not present

## 2016-06-13 DIAGNOSIS — N5201 Erectile dysfunction due to arterial insufficiency: Secondary | ICD-10-CM | POA: Diagnosis not present

## 2016-06-13 DIAGNOSIS — Z8546 Personal history of malignant neoplasm of prostate: Secondary | ICD-10-CM | POA: Diagnosis not present

## 2016-06-13 DIAGNOSIS — N3281 Overactive bladder: Secondary | ICD-10-CM | POA: Diagnosis not present

## 2016-06-25 DIAGNOSIS — L918 Other hypertrophic disorders of the skin: Secondary | ICD-10-CM | POA: Diagnosis not present

## 2016-06-25 DIAGNOSIS — L820 Inflamed seborrheic keratosis: Secondary | ICD-10-CM | POA: Diagnosis not present

## 2016-06-25 DIAGNOSIS — L821 Other seborrheic keratosis: Secondary | ICD-10-CM | POA: Diagnosis not present

## 2016-06-25 DIAGNOSIS — L578 Other skin changes due to chronic exposure to nonionizing radiation: Secondary | ICD-10-CM | POA: Diagnosis not present

## 2016-06-25 DIAGNOSIS — L570 Actinic keratosis: Secondary | ICD-10-CM | POA: Diagnosis not present

## 2016-06-26 DIAGNOSIS — E038 Other specified hypothyroidism: Secondary | ICD-10-CM | POA: Diagnosis not present

## 2016-06-26 DIAGNOSIS — N401 Enlarged prostate with lower urinary tract symptoms: Secondary | ICD-10-CM | POA: Diagnosis not present

## 2016-06-26 DIAGNOSIS — J45909 Unspecified asthma, uncomplicated: Secondary | ICD-10-CM | POA: Diagnosis not present

## 2016-06-26 DIAGNOSIS — E785 Hyperlipidemia, unspecified: Secondary | ICD-10-CM | POA: Diagnosis not present

## 2016-06-26 DIAGNOSIS — E663 Overweight: Secondary | ICD-10-CM | POA: Diagnosis not present

## 2016-06-26 DIAGNOSIS — Z6825 Body mass index (BMI) 25.0-25.9, adult: Secondary | ICD-10-CM | POA: Diagnosis not present

## 2016-06-26 DIAGNOSIS — J301 Allergic rhinitis due to pollen: Secondary | ICD-10-CM | POA: Diagnosis not present

## 2016-06-26 DIAGNOSIS — K210 Gastro-esophageal reflux disease with esophagitis: Secondary | ICD-10-CM | POA: Diagnosis not present

## 2016-06-26 DIAGNOSIS — E119 Type 2 diabetes mellitus without complications: Secondary | ICD-10-CM | POA: Diagnosis not present

## 2016-06-26 DIAGNOSIS — C61 Malignant neoplasm of prostate: Secondary | ICD-10-CM | POA: Diagnosis not present

## 2016-06-26 DIAGNOSIS — I10 Essential (primary) hypertension: Secondary | ICD-10-CM | POA: Diagnosis not present

## 2016-08-22 DIAGNOSIS — J301 Allergic rhinitis due to pollen: Secondary | ICD-10-CM | POA: Diagnosis not present

## 2016-08-22 DIAGNOSIS — J45909 Unspecified asthma, uncomplicated: Secondary | ICD-10-CM | POA: Diagnosis not present

## 2016-08-22 DIAGNOSIS — Z6826 Body mass index (BMI) 26.0-26.9, adult: Secondary | ICD-10-CM | POA: Diagnosis not present

## 2016-08-22 DIAGNOSIS — J019 Acute sinusitis, unspecified: Secondary | ICD-10-CM | POA: Diagnosis not present

## 2016-09-26 DIAGNOSIS — E119 Type 2 diabetes mellitus without complications: Secondary | ICD-10-CM | POA: Diagnosis not present

## 2016-09-26 DIAGNOSIS — E063 Autoimmune thyroiditis: Secondary | ICD-10-CM | POA: Diagnosis not present

## 2016-09-26 DIAGNOSIS — J45909 Unspecified asthma, uncomplicated: Secondary | ICD-10-CM | POA: Diagnosis not present

## 2016-09-26 DIAGNOSIS — K210 Gastro-esophageal reflux disease with esophagitis: Secondary | ICD-10-CM | POA: Diagnosis not present

## 2016-09-26 DIAGNOSIS — C61 Malignant neoplasm of prostate: Secondary | ICD-10-CM | POA: Diagnosis not present

## 2016-09-26 DIAGNOSIS — I10 Essential (primary) hypertension: Secondary | ICD-10-CM | POA: Diagnosis not present

## 2016-09-26 DIAGNOSIS — N62 Hypertrophy of breast: Secondary | ICD-10-CM | POA: Diagnosis not present

## 2016-09-26 DIAGNOSIS — E785 Hyperlipidemia, unspecified: Secondary | ICD-10-CM | POA: Diagnosis not present

## 2016-09-26 DIAGNOSIS — J301 Allergic rhinitis due to pollen: Secondary | ICD-10-CM | POA: Diagnosis not present

## 2016-09-26 DIAGNOSIS — N401 Enlarged prostate with lower urinary tract symptoms: Secondary | ICD-10-CM | POA: Diagnosis not present

## 2016-09-28 DIAGNOSIS — H35033 Hypertensive retinopathy, bilateral: Secondary | ICD-10-CM | POA: Diagnosis not present

## 2016-09-28 DIAGNOSIS — H40013 Open angle with borderline findings, low risk, bilateral: Secondary | ICD-10-CM | POA: Diagnosis not present

## 2016-09-28 DIAGNOSIS — E119 Type 2 diabetes mellitus without complications: Secondary | ICD-10-CM | POA: Diagnosis not present

## 2016-09-28 DIAGNOSIS — Z961 Presence of intraocular lens: Secondary | ICD-10-CM | POA: Diagnosis not present

## 2016-10-16 DIAGNOSIS — J301 Allergic rhinitis due to pollen: Secondary | ICD-10-CM | POA: Diagnosis not present

## 2016-10-16 DIAGNOSIS — R05 Cough: Secondary | ICD-10-CM | POA: Diagnosis not present

## 2016-10-16 DIAGNOSIS — J45902 Unspecified asthma with status asthmaticus: Secondary | ICD-10-CM | POA: Diagnosis not present

## 2016-10-16 DIAGNOSIS — J189 Pneumonia, unspecified organism: Secondary | ICD-10-CM | POA: Diagnosis not present

## 2016-12-05 DIAGNOSIS — Z86010 Personal history of colonic polyps: Secondary | ICD-10-CM | POA: Diagnosis not present

## 2016-12-05 DIAGNOSIS — K644 Residual hemorrhoidal skin tags: Secondary | ICD-10-CM | POA: Diagnosis not present

## 2016-12-05 DIAGNOSIS — Z862 Personal history of diseases of the blood and blood-forming organs and certain disorders involving the immune mechanism: Secondary | ICD-10-CM | POA: Diagnosis not present

## 2016-12-05 DIAGNOSIS — K625 Hemorrhage of anus and rectum: Secondary | ICD-10-CM | POA: Diagnosis not present

## 2016-12-11 DIAGNOSIS — Z86010 Personal history of colonic polyps: Secondary | ICD-10-CM | POA: Diagnosis not present

## 2016-12-11 DIAGNOSIS — D126 Benign neoplasm of colon, unspecified: Secondary | ICD-10-CM | POA: Diagnosis not present

## 2016-12-17 DIAGNOSIS — D126 Benign neoplasm of colon, unspecified: Secondary | ICD-10-CM | POA: Diagnosis not present

## 2016-12-24 DIAGNOSIS — L578 Other skin changes due to chronic exposure to nonionizing radiation: Secondary | ICD-10-CM | POA: Diagnosis not present

## 2016-12-24 DIAGNOSIS — D1801 Hemangioma of skin and subcutaneous tissue: Secondary | ICD-10-CM | POA: Diagnosis not present

## 2016-12-24 DIAGNOSIS — L570 Actinic keratosis: Secondary | ICD-10-CM | POA: Diagnosis not present

## 2016-12-25 DIAGNOSIS — Z79899 Other long term (current) drug therapy: Secondary | ICD-10-CM | POA: Diagnosis not present

## 2016-12-25 DIAGNOSIS — E663 Overweight: Secondary | ICD-10-CM | POA: Diagnosis not present

## 2016-12-25 DIAGNOSIS — I10 Essential (primary) hypertension: Secondary | ICD-10-CM | POA: Diagnosis not present

## 2016-12-25 DIAGNOSIS — N401 Enlarged prostate with lower urinary tract symptoms: Secondary | ICD-10-CM | POA: Diagnosis not present

## 2016-12-25 DIAGNOSIS — J301 Allergic rhinitis due to pollen: Secondary | ICD-10-CM | POA: Diagnosis not present

## 2016-12-25 DIAGNOSIS — C61 Malignant neoplasm of prostate: Secondary | ICD-10-CM | POA: Diagnosis not present

## 2016-12-25 DIAGNOSIS — J45909 Unspecified asthma, uncomplicated: Secondary | ICD-10-CM | POA: Diagnosis not present

## 2016-12-25 DIAGNOSIS — E063 Autoimmune thyroiditis: Secondary | ICD-10-CM | POA: Diagnosis not present

## 2016-12-25 DIAGNOSIS — D649 Anemia, unspecified: Secondary | ICD-10-CM | POA: Diagnosis not present

## 2016-12-25 DIAGNOSIS — E119 Type 2 diabetes mellitus without complications: Secondary | ICD-10-CM | POA: Diagnosis not present

## 2016-12-25 DIAGNOSIS — K210 Gastro-esophageal reflux disease with esophagitis: Secondary | ICD-10-CM | POA: Diagnosis not present

## 2016-12-25 DIAGNOSIS — E785 Hyperlipidemia, unspecified: Secondary | ICD-10-CM | POA: Diagnosis not present

## 2017-01-08 DIAGNOSIS — J45909 Unspecified asthma, uncomplicated: Secondary | ICD-10-CM | POA: Diagnosis not present

## 2017-01-08 DIAGNOSIS — K210 Gastro-esophageal reflux disease with esophagitis: Secondary | ICD-10-CM | POA: Diagnosis not present

## 2017-01-08 DIAGNOSIS — E119 Type 2 diabetes mellitus without complications: Secondary | ICD-10-CM | POA: Diagnosis not present

## 2017-01-08 DIAGNOSIS — N401 Enlarged prostate with lower urinary tract symptoms: Secondary | ICD-10-CM | POA: Diagnosis not present

## 2017-01-08 DIAGNOSIS — Z6825 Body mass index (BMI) 25.0-25.9, adult: Secondary | ICD-10-CM | POA: Diagnosis not present

## 2017-01-08 DIAGNOSIS — I10 Essential (primary) hypertension: Secondary | ICD-10-CM | POA: Diagnosis not present

## 2017-01-08 DIAGNOSIS — E063 Autoimmune thyroiditis: Secondary | ICD-10-CM | POA: Diagnosis not present

## 2017-01-08 DIAGNOSIS — E785 Hyperlipidemia, unspecified: Secondary | ICD-10-CM | POA: Diagnosis not present

## 2017-01-08 DIAGNOSIS — J301 Allergic rhinitis due to pollen: Secondary | ICD-10-CM | POA: Diagnosis not present

## 2017-01-08 DIAGNOSIS — E538 Deficiency of other specified B group vitamins: Secondary | ICD-10-CM | POA: Diagnosis not present

## 2017-01-11 DIAGNOSIS — R194 Change in bowel habit: Secondary | ICD-10-CM | POA: Diagnosis not present

## 2017-01-11 DIAGNOSIS — Z86010 Personal history of colonic polyps: Secondary | ICD-10-CM | POA: Diagnosis not present

## 2017-01-22 DIAGNOSIS — I10 Essential (primary) hypertension: Secondary | ICD-10-CM | POA: Diagnosis not present

## 2017-01-22 DIAGNOSIS — Z6825 Body mass index (BMI) 25.0-25.9, adult: Secondary | ICD-10-CM | POA: Diagnosis not present

## 2017-02-21 ENCOUNTER — Encounter: Payer: Self-pay | Admitting: Cardiology

## 2017-03-12 ENCOUNTER — Encounter: Payer: Self-pay | Admitting: Cardiology

## 2017-03-12 ENCOUNTER — Ambulatory Visit (INDEPENDENT_AMBULATORY_CARE_PROVIDER_SITE_OTHER): Payer: Medicare Other | Admitting: Cardiology

## 2017-03-12 VITALS — BP 142/68 | HR 76 | Ht 67.0 in | Wt 160.4 lb

## 2017-03-12 DIAGNOSIS — E784 Other hyperlipidemia: Secondary | ICD-10-CM | POA: Diagnosis not present

## 2017-03-12 DIAGNOSIS — I10 Essential (primary) hypertension: Secondary | ICD-10-CM | POA: Diagnosis not present

## 2017-03-12 DIAGNOSIS — I351 Nonrheumatic aortic (valve) insufficiency: Secondary | ICD-10-CM

## 2017-03-12 DIAGNOSIS — E7849 Other hyperlipidemia: Secondary | ICD-10-CM

## 2017-03-12 HISTORY — DX: Essential (primary) hypertension: I10

## 2017-03-12 HISTORY — DX: Nonrheumatic aortic (valve) insufficiency: I35.1

## 2017-03-12 NOTE — Progress Notes (Signed)
Cardiology Office Note:    Date:  03/12/2017   ID:  [name], DOB 05/24/1941, MRN [id number]  PCP:  Ocie Doyne., MD  Cardiologist:  Shirlee More, MD    Referring MD: Ocie Doyne., MD    ASSESSMENT:    1. Nonrheumatic aortic valve insufficiency   2. Essential hypertension   3. Other hyperlipidemia    PLAN:    In order of problems listed above:  1. Clinically stable at risk for progressive aortic regurgitation and/or left ventricular systolic dysfunction dilation I asked him to have a follow-up echocardiogram performed and if he has high risk markers were required consideration of intervention. In the interim I told him the key here is control of hypertension and continue his current blood pressure medications including ARB. 2. Stable home blood pressures run less than 40 continue current treatment ARB 3. Stable continue statin goal LDL less than 100.   Next appointment: One year   Medication Adjustments/Labs and Tests Ordered: Current medicines are reviewed at length with the patient today.  Concerns regarding medicines are outlined above.  Orders Placed This Encounter  Procedures  . EKG 12-Lead  . ECHOCARDIOGRAM COMPLETE   No orders of the defined types were placed in this encounter.   Chief Complaint  Patient presents with  . Follow-up    Routine flup appt     History of Present Illness:    Bruce Nunez is a 76 y.o. male with a hx of hypertension, hyperlipidemia , T2 DMaortic regurgitation last seen within the last 3 years. Compliance with diet, lifestyle and medications: Yes He is unaware of any exercise intolerance shortness of breath edema chest pain palpitation or syncope. Recent labs requested from his PCP office. He has had no hospitalizations or interventions since last seen. Past Medical History:  Diagnosis Date  . Anemia    recent iron supplement  . Asthma    childhood-   2013 bronchitis w/wheezing  . Complication of anesthesia    " ether  made me sick" - "woke up during colonoscopy"  . Diabetes mellitus without complication ([place])   . Frequency of urination   . GERD (gastroesophageal reflux disease)    occasional  . Heart murmur   . Hemorrhoids   . History of kidney stones   . Hx of colon cancer, stage I   . Hx of nonmelanoma skin cancer   . Hx of peptic ulcer    as teenager  . Hx of transfusion   . Hyperlipidemia   . Hypertension   . Inguinal hernia   . PONV (postoperative nausea and vomiting)    only when had Ether as Anesthesia drug  . Prostate cancer ([place]) 08/15/12   also Hx colon cancer / Hx skin cancer  . Right inguinal hernia 05/08/2016  . S/P right inguinal hernia repair Sept 2015 05/17/2014   Recurrent right indirect  Hernia treated with robotic TAPP repair with enterolysis Sept 2017    Past Surgical History:  Procedure Laterality Date  . cataract surgery  2009  . CYSTOSCOPY    . INGUINAL HERNIA REPAIR Right 05/17/2014   Procedure: RIGHT INGUINAL HERNIA REPAIR ;  Surgeon: Kaylyn Lim, MD;  Location: [facility];  Service: General;  Laterality: Right;  With [place] Right 2012  . PARTIAL COLECTOMY  2005  . 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 RADIOACTIVE SEED IMPLANT N/A 11/24/2012   Procedure: RADIOACTIVE SEED IMPLANT;  Surgeon: Bernestine Amass,  MD;  Location: [facility];  Service: Urology;  Laterality: N/A;   71seeds implanted  no seeds found in bladder  . THROAT SURGERY  1940's   as child (age 38)growth that created fissure/corrective surgery  . TONSILLECTOMY     age 70  . XI ROBOTIC ASSISTED INGUINAL HERNIA REPAIR WITH MESH Right 05/08/2016   Procedure: XI ROBOTIC ASSISTED REPAIR OF RECURRENT RIGHT INGUINAL HERNIA;  Surgeon: Johnathan Hausen, MD;  Location: [facility];  Service: General;  Laterality: Right;  With MESH    Current Medications: Current Meds  Medication Sig  . albuterol (PROVENTIL HFA;VENTOLIN HFA) 108 (90 BASE) MCG/ACT inhaler Inhale 2 puffs into the lungs  every 6 (six) hours as needed for wheezing.  . bisoprolol-hydrochlorothiazide (ZIAC) 10-6.25 MG per tablet Take 1 tablet by mouth every morning.   . Cyanocobalamin (VITAMIN B 12 PO) Take 1 tablet by mouth daily.  . fesoterodine (TOVIAZ) 4 MG TB24 tablet Take 4 mg by mouth daily.  . fexofenadine (ALLEGRA) 180 MG tablet Take 180 mg by mouth daily.  . fluticasone (FLONASE) 50 MCG/ACT nasal spray Place 2 sprays into both nostrils daily.  . fluticasone-salmeterol (ADVAIR HFA) 115-21 MCG/ACT inhaler Inhale 2 puffs into the lungs 2 (two) times daily.  Marland Kitchen levothyroxine (SYNTHROID, LEVOTHROID) 25 MCG tablet Take 25 mcg by mouth See admin instructions. Takes every other day  . losartan (COZAAR) 100 MG tablet Take 100 mg by mouth every morning.   . metFORMIN (GLUCOPHAGE) 500 MG tablet Take 500 mg by mouth 2 (two) times daily with a meal.   . pravastatin (PRAVACHOL) 10 MG tablet Take 10 mg by mouth daily.  . Tamsulosin HCl (FLOMAX) 0.4 MG CAPS Take 0.4 mg by mouth daily.     Allergies:   Penicillins; Aspirin; Monodox [doxycycline hyclate]; Sulfa antibiotics; and Tramadol   Social History   Social History  . Marital status: Married    Spouse name: N/A  . Number of children: 2  . Years of education: N/A   Occupational History  . Retired     Charity fundraiser   Social History Main Topics  . Smoking status: Never Smoker  . Smokeless tobacco: Never Used  . Alcohol use No  . Drug use: No  . Sexual activity: Not Asked   Other Topics Concern  . None   Social History Narrative  . None     Family History: The patient's family history includes Brain cancer in his unknown relative; Coronary artery disease in his unknown relative; Heart attack in his unknown relative; Prostate cancer in his unknown relative. ROS:   Please see the history of present illness.    All other systems reviewed and are negative.  EKGs/Labs/Other Studies Reviewed:    The following studies were reviewed today: 2016 stress  myocardial perfusion study was normal at 10 minutes low risk. 2016 echocardiogram showed normal left ventricular size and function and mild to moderate aortic regurgitation EKG:  EKG ordered today.  The ekg ordered today demonstrates [place], normal  Recent Labs: 05/07/2016: BUN 22; Creatinine, Ser 0.88; Potassium 4.2; Sodium 139 05/09/2016: Hemoglobin 13.8; Platelets 193  Recent Lipid Panel No results found for: CHOL, TRIG, HDL, CHOLHDL, VLDL, LDLCALC, LDLDIRECT  Physical Exam:    VS:  BP (!) 142/68   Pulse 76   Ht 5\' 7"  (1.702 m)   Wt 160 lb 6.4 oz (72.8 kg)   SpO2 98%   BMI 25.12 kg/m     Wt Readings from Last 3 Encounters:  03/12/17 160 lb 6.4 oz (72.8 kg)  05/08/16 161 lb (73 kg)  05/07/16 161 lb 12.8 oz (73.4 kg)     GEN:  Well nourished, well developed in no acute distress HEENT: Normal NECK: No JVD; No carotid bruits LYMPHATICS: No lymphadenopathy CARDIAC: 1-2/6 AR aortic area and Left sternal border RRR, no murmurs, rubs, gallops RESPIRATORY:  Clear to auscultation without rales, wheezing or rhonchi  ABDOMEN: Soft, non-tender, non-distended MUSCULOSKELETAL:  No edema; No deformity  SKIN: Warm and dry NEUROLOGIC:  Alert and oriented x 3 PSYCHIATRIC:  Normal affect    Signed, Shirlee More, MD  03/12/2017 12:45 PM     [facility]

## 2017-03-12 NOTE — Patient Instructions (Addendum)
Medication Instructions:  Your physician recommends that you continue on your current medications as directed. Please refer to the Current Medication list given to you today.   Labwork: None  Testing/Procedures: Your physician has requested that you have an echocardiogram. Echocardiography is a painless test that uses sound waves to create images of your heart. It provides your doctor with information about the size and shape of your heart and how well your heart's chambers and valves are working. This procedure takes approximately one hour. There are no restrictions for this procedure.  You had an EKG today.   Follow-Up: Your physician wants you to follow-up in: 1 year. You will receive a reminder letter in the mail two months in advance. If you don't receive a letter, please call our office to schedule the follow-up appointment.    Any Other Special Instructions Will Be Listed Below (If Applicable).     If you need a refill on your cardiac medications before your next appointment, please call your pharmacy.

## 2017-03-21 ENCOUNTER — Ambulatory Visit (HOSPITAL_BASED_OUTPATIENT_CLINIC_OR_DEPARTMENT_OTHER)
Admission: RE | Admit: 2017-03-21 | Discharge: 2017-03-21 | Disposition: A | Discharge status: Home / Self Care | Payer: Medicare Other | Source: Ambulatory Visit | Attending: Cardiology | Admitting: Cardiology

## 2017-03-21 DIAGNOSIS — I340 Nonrheumatic mitral (valve) insufficiency: Secondary | ICD-10-CM | POA: Diagnosis not present

## 2017-03-21 DIAGNOSIS — E785 Hyperlipidemia, unspecified: Secondary | ICD-10-CM | POA: Diagnosis not present

## 2017-03-21 DIAGNOSIS — I119 Hypertensive heart disease without heart failure: Secondary | ICD-10-CM | POA: Insufficient documentation

## 2017-03-21 DIAGNOSIS — I351 Nonrheumatic aortic (valve) insufficiency: Secondary | ICD-10-CM | POA: Diagnosis not present

## 2017-03-21 DIAGNOSIS — I352 Nonrheumatic aortic (valve) stenosis with insufficiency: Secondary | ICD-10-CM | POA: Diagnosis not present

## 2017-03-21 DIAGNOSIS — E119 Type 2 diabetes mellitus without complications: Secondary | ICD-10-CM | POA: Diagnosis not present

## 2017-03-21 NOTE — Progress Notes (Signed)
  Echocardiogram 2D Echocardiogram has been performed.  Jennette Dubin 03/21/2017, 1:53 PM

## 2017-03-28 DIAGNOSIS — H40013 Open angle with borderline findings, low risk, bilateral: Secondary | ICD-10-CM | POA: Diagnosis not present

## 2017-03-29 DIAGNOSIS — I351 Nonrheumatic aortic (valve) insufficiency: Secondary | ICD-10-CM | POA: Diagnosis not present

## 2017-03-29 DIAGNOSIS — I10 Essential (primary) hypertension: Secondary | ICD-10-CM | POA: Diagnosis not present

## 2017-03-29 DIAGNOSIS — E785 Hyperlipidemia, unspecified: Secondary | ICD-10-CM | POA: Diagnosis not present

## 2017-03-29 DIAGNOSIS — E119 Type 2 diabetes mellitus without complications: Secondary | ICD-10-CM | POA: Diagnosis not present

## 2017-03-29 DIAGNOSIS — C61 Malignant neoplasm of prostate: Secondary | ICD-10-CM | POA: Diagnosis not present

## 2017-03-29 DIAGNOSIS — E063 Autoimmune thyroiditis: Secondary | ICD-10-CM | POA: Diagnosis not present

## 2017-03-29 DIAGNOSIS — Z6824 Body mass index (BMI) 24.0-24.9, adult: Secondary | ICD-10-CM | POA: Diagnosis not present

## 2017-05-17 DIAGNOSIS — L300 Nummular dermatitis: Secondary | ICD-10-CM | POA: Diagnosis not present

## 2017-05-17 DIAGNOSIS — L299 Pruritus, unspecified: Secondary | ICD-10-CM | POA: Diagnosis not present

## 2017-06-01 DIAGNOSIS — Z23 Encounter for immunization: Secondary | ICD-10-CM | POA: Diagnosis not present

## 2017-06-17 DIAGNOSIS — C61 Malignant neoplasm of prostate: Secondary | ICD-10-CM | POA: Diagnosis not present

## 2017-06-25 DIAGNOSIS — L570 Actinic keratosis: Secondary | ICD-10-CM | POA: Diagnosis not present

## 2017-06-25 DIAGNOSIS — L820 Inflamed seborrheic keratosis: Secondary | ICD-10-CM | POA: Diagnosis not present

## 2017-06-25 DIAGNOSIS — L578 Other skin changes due to chronic exposure to nonionizing radiation: Secondary | ICD-10-CM | POA: Diagnosis not present

## 2017-06-25 DIAGNOSIS — L821 Other seborrheic keratosis: Secondary | ICD-10-CM | POA: Diagnosis not present

## 2017-07-01 DIAGNOSIS — Z23 Encounter for immunization: Secondary | ICD-10-CM | POA: Diagnosis not present

## 2017-07-01 DIAGNOSIS — E785 Hyperlipidemia, unspecified: Secondary | ICD-10-CM | POA: Diagnosis not present

## 2017-07-01 DIAGNOSIS — Z6824 Body mass index (BMI) 24.0-24.9, adult: Secondary | ICD-10-CM | POA: Diagnosis not present

## 2017-07-01 DIAGNOSIS — E063 Autoimmune thyroiditis: Secondary | ICD-10-CM | POA: Diagnosis not present

## 2017-07-01 DIAGNOSIS — K210 Gastro-esophageal reflux disease with esophagitis: Secondary | ICD-10-CM | POA: Diagnosis not present

## 2017-07-01 DIAGNOSIS — I10 Essential (primary) hypertension: Secondary | ICD-10-CM | POA: Diagnosis not present

## 2017-07-01 DIAGNOSIS — J45909 Unspecified asthma, uncomplicated: Secondary | ICD-10-CM | POA: Diagnosis not present

## 2017-07-01 DIAGNOSIS — N401 Enlarged prostate with lower urinary tract symptoms: Secondary | ICD-10-CM | POA: Diagnosis not present

## 2017-07-01 DIAGNOSIS — C61 Malignant neoplasm of prostate: Secondary | ICD-10-CM | POA: Diagnosis not present

## 2017-07-01 DIAGNOSIS — J301 Allergic rhinitis due to pollen: Secondary | ICD-10-CM | POA: Diagnosis not present

## 2017-07-01 DIAGNOSIS — E119 Type 2 diabetes mellitus without complications: Secondary | ICD-10-CM | POA: Diagnosis not present

## 2017-07-03 DIAGNOSIS — Z8546 Personal history of malignant neoplasm of prostate: Secondary | ICD-10-CM | POA: Diagnosis not present

## 2017-07-03 DIAGNOSIS — N3281 Overactive bladder: Secondary | ICD-10-CM | POA: Diagnosis not present

## 2017-07-22 DIAGNOSIS — Z6825 Body mass index (BMI) 25.0-25.9, adult: Secondary | ICD-10-CM | POA: Diagnosis not present

## 2017-07-22 DIAGNOSIS — J45902 Unspecified asthma with status asthmaticus: Secondary | ICD-10-CM | POA: Diagnosis not present

## 2017-07-22 DIAGNOSIS — J45909 Unspecified asthma, uncomplicated: Secondary | ICD-10-CM | POA: Diagnosis not present

## 2017-07-22 DIAGNOSIS — J189 Pneumonia, unspecified organism: Secondary | ICD-10-CM | POA: Diagnosis not present

## 2017-07-22 DIAGNOSIS — J301 Allergic rhinitis due to pollen: Secondary | ICD-10-CM | POA: Diagnosis not present

## 2017-09-23 DIAGNOSIS — E119 Type 2 diabetes mellitus without complications: Secondary | ICD-10-CM | POA: Diagnosis not present

## 2017-09-23 DIAGNOSIS — J45909 Unspecified asthma, uncomplicated: Secondary | ICD-10-CM | POA: Diagnosis not present

## 2017-09-23 DIAGNOSIS — R05 Cough: Secondary | ICD-10-CM | POA: Diagnosis not present

## 2017-09-23 DIAGNOSIS — Z6825 Body mass index (BMI) 25.0-25.9, adult: Secondary | ICD-10-CM | POA: Diagnosis not present

## 2017-09-23 DIAGNOSIS — J301 Allergic rhinitis due to pollen: Secondary | ICD-10-CM | POA: Diagnosis not present

## 2017-09-23 DIAGNOSIS — E663 Overweight: Secondary | ICD-10-CM | POA: Diagnosis not present

## 2017-09-23 DIAGNOSIS — J189 Pneumonia, unspecified organism: Secondary | ICD-10-CM | POA: Diagnosis not present

## 2017-11-15 DIAGNOSIS — E785 Hyperlipidemia, unspecified: Secondary | ICD-10-CM | POA: Diagnosis not present

## 2017-11-15 DIAGNOSIS — J301 Allergic rhinitis due to pollen: Secondary | ICD-10-CM | POA: Diagnosis not present

## 2017-11-15 DIAGNOSIS — E118 Type 2 diabetes mellitus with unspecified complications: Secondary | ICD-10-CM | POA: Diagnosis not present

## 2017-11-15 DIAGNOSIS — Z6825 Body mass index (BMI) 25.0-25.9, adult: Secondary | ICD-10-CM | POA: Diagnosis not present

## 2017-11-15 DIAGNOSIS — K210 Gastro-esophageal reflux disease with esophagitis: Secondary | ICD-10-CM | POA: Diagnosis not present

## 2017-11-15 DIAGNOSIS — J45909 Unspecified asthma, uncomplicated: Secondary | ICD-10-CM | POA: Diagnosis not present

## 2017-11-15 DIAGNOSIS — Z79899 Other long term (current) drug therapy: Secondary | ICD-10-CM | POA: Diagnosis not present

## 2017-11-15 DIAGNOSIS — E063 Autoimmune thyroiditis: Secondary | ICD-10-CM | POA: Diagnosis not present

## 2017-11-15 DIAGNOSIS — M818 Other osteoporosis without current pathological fracture: Secondary | ICD-10-CM | POA: Diagnosis not present

## 2017-11-15 DIAGNOSIS — I10 Essential (primary) hypertension: Secondary | ICD-10-CM | POA: Diagnosis not present

## 2017-11-15 DIAGNOSIS — N401 Enlarged prostate with lower urinary tract symptoms: Secondary | ICD-10-CM | POA: Diagnosis not present

## 2017-11-22 DIAGNOSIS — H40013 Open angle with borderline findings, low risk, bilateral: Secondary | ICD-10-CM | POA: Diagnosis not present

## 2017-11-22 DIAGNOSIS — H26492 Other secondary cataract, left eye: Secondary | ICD-10-CM | POA: Diagnosis not present

## 2017-11-22 DIAGNOSIS — H02833 Dermatochalasis of right eye, unspecified eyelid: Secondary | ICD-10-CM | POA: Diagnosis not present

## 2017-11-22 DIAGNOSIS — H01003 Unspecified blepharitis right eye, unspecified eyelid: Secondary | ICD-10-CM | POA: Diagnosis not present

## 2017-12-24 DIAGNOSIS — L570 Actinic keratosis: Secondary | ICD-10-CM | POA: Diagnosis not present

## 2017-12-24 DIAGNOSIS — L821 Other seborrheic keratosis: Secondary | ICD-10-CM | POA: Diagnosis not present

## 2017-12-24 DIAGNOSIS — L720 Epidermal cyst: Secondary | ICD-10-CM | POA: Diagnosis not present

## 2017-12-24 DIAGNOSIS — L578 Other skin changes due to chronic exposure to nonionizing radiation: Secondary | ICD-10-CM | POA: Diagnosis not present

## 2018-01-08 DIAGNOSIS — R159 Full incontinence of feces: Secondary | ICD-10-CM | POA: Diagnosis not present

## 2018-01-08 DIAGNOSIS — K591 Functional diarrhea: Secondary | ICD-10-CM | POA: Diagnosis not present

## 2018-01-08 DIAGNOSIS — R152 Fecal urgency: Secondary | ICD-10-CM | POA: Diagnosis not present

## 2018-01-08 DIAGNOSIS — R198 Other specified symptoms and signs involving the digestive system and abdomen: Secondary | ICD-10-CM | POA: Diagnosis not present

## 2018-02-17 DIAGNOSIS — I10 Essential (primary) hypertension: Secondary | ICD-10-CM | POA: Diagnosis not present

## 2018-02-17 DIAGNOSIS — E785 Hyperlipidemia, unspecified: Secondary | ICD-10-CM | POA: Diagnosis not present

## 2018-02-17 DIAGNOSIS — N401 Enlarged prostate with lower urinary tract symptoms: Secondary | ICD-10-CM | POA: Diagnosis not present

## 2018-02-17 DIAGNOSIS — Z9181 History of falling: Secondary | ICD-10-CM | POA: Diagnosis not present

## 2018-02-17 DIAGNOSIS — E063 Autoimmune thyroiditis: Secondary | ICD-10-CM | POA: Diagnosis not present

## 2018-02-17 DIAGNOSIS — E118 Type 2 diabetes mellitus with unspecified complications: Secondary | ICD-10-CM | POA: Diagnosis not present

## 2018-02-17 DIAGNOSIS — Z1331 Encounter for screening for depression: Secondary | ICD-10-CM | POA: Diagnosis not present

## 2018-03-11 DIAGNOSIS — K591 Functional diarrhea: Secondary | ICD-10-CM | POA: Diagnosis not present

## 2018-03-11 DIAGNOSIS — R152 Fecal urgency: Secondary | ICD-10-CM | POA: Diagnosis not present

## 2018-03-11 DIAGNOSIS — R198 Other specified symptoms and signs involving the digestive system and abdomen: Secondary | ICD-10-CM | POA: Diagnosis not present

## 2018-03-11 DIAGNOSIS — R159 Full incontinence of feces: Secondary | ICD-10-CM | POA: Diagnosis not present

## 2018-05-20 DIAGNOSIS — Z23 Encounter for immunization: Secondary | ICD-10-CM | POA: Diagnosis not present

## 2018-05-20 DIAGNOSIS — E118 Type 2 diabetes mellitus with unspecified complications: Secondary | ICD-10-CM | POA: Diagnosis not present

## 2018-05-20 DIAGNOSIS — E063 Autoimmune thyroiditis: Secondary | ICD-10-CM | POA: Diagnosis not present

## 2018-05-20 DIAGNOSIS — E785 Hyperlipidemia, unspecified: Secondary | ICD-10-CM | POA: Diagnosis not present

## 2018-05-26 DIAGNOSIS — H40013 Open angle with borderline findings, low risk, bilateral: Secondary | ICD-10-CM | POA: Diagnosis not present

## 2018-06-26 DIAGNOSIS — L570 Actinic keratosis: Secondary | ICD-10-CM | POA: Diagnosis not present

## 2018-06-26 DIAGNOSIS — L578 Other skin changes due to chronic exposure to nonionizing radiation: Secondary | ICD-10-CM | POA: Diagnosis not present

## 2018-06-26 DIAGNOSIS — L821 Other seborrheic keratosis: Secondary | ICD-10-CM | POA: Diagnosis not present

## 2018-06-26 DIAGNOSIS — L820 Inflamed seborrheic keratosis: Secondary | ICD-10-CM | POA: Diagnosis not present

## 2018-07-22 DIAGNOSIS — Z8546 Personal history of malignant neoplasm of prostate: Secondary | ICD-10-CM | POA: Diagnosis not present

## 2018-07-31 DIAGNOSIS — C61 Malignant neoplasm of prostate: Secondary | ICD-10-CM | POA: Diagnosis not present

## 2018-07-31 DIAGNOSIS — N3943 Post-void dribbling: Secondary | ICD-10-CM | POA: Diagnosis not present

## 2018-07-31 DIAGNOSIS — N401 Enlarged prostate with lower urinary tract symptoms: Secondary | ICD-10-CM | POA: Diagnosis not present

## 2018-07-31 DIAGNOSIS — N3281 Overactive bladder: Secondary | ICD-10-CM | POA: Diagnosis not present

## 2018-08-06 DIAGNOSIS — L820 Inflamed seborrheic keratosis: Secondary | ICD-10-CM | POA: Diagnosis not present

## 2018-08-06 DIAGNOSIS — L821 Other seborrheic keratosis: Secondary | ICD-10-CM | POA: Diagnosis not present

## 2018-08-06 DIAGNOSIS — L918 Other hypertrophic disorders of the skin: Secondary | ICD-10-CM | POA: Diagnosis not present

## 2018-08-18 DIAGNOSIS — J189 Pneumonia, unspecified organism: Secondary | ICD-10-CM | POA: Diagnosis not present

## 2018-08-18 DIAGNOSIS — J45902 Unspecified asthma with status asthmaticus: Secondary | ICD-10-CM | POA: Diagnosis not present

## 2018-08-18 DIAGNOSIS — H6120 Impacted cerumen, unspecified ear: Secondary | ICD-10-CM | POA: Diagnosis not present

## 2018-08-18 DIAGNOSIS — J301 Allergic rhinitis due to pollen: Secondary | ICD-10-CM | POA: Diagnosis not present

## 2018-08-26 DIAGNOSIS — E118 Type 2 diabetes mellitus with unspecified complications: Secondary | ICD-10-CM | POA: Diagnosis not present

## 2018-08-26 DIAGNOSIS — E785 Hyperlipidemia, unspecified: Secondary | ICD-10-CM | POA: Diagnosis not present

## 2018-08-26 DIAGNOSIS — I10 Essential (primary) hypertension: Secondary | ICD-10-CM | POA: Diagnosis not present

## 2018-08-26 DIAGNOSIS — E063 Autoimmune thyroiditis: Secondary | ICD-10-CM | POA: Diagnosis not present

## 2018-11-24 ENCOUNTER — Telehealth: Payer: Self-pay | Admitting: Cardiology

## 2018-11-24 NOTE — Telephone Encounter (Signed)
° °  Cardiac Questionnaire:    Since your last visit or hospitalization:    1. Have you been having new or worsening chest pain? no   2. Have you been having new or worsening shortness of breath? Has shortness of breath but not worsening since last office visit 3. Have you been having new or worsening leg swelling, wt gain, or increase in abdominal girth (pants fitting more tightly)? no   4. Have you had any passing out spells? no    *A YES to any of these questions would result in the appointment being kept. *If all the answers to these questions are NO, we should indicate that given the current situation regarding the worldwide coronarvirus pandemic, at the recommendation of the CDC, we are looking to limit gatherings in our waiting area, and thus will reschedule their appointment beyond four weeks from today.   _____________   JEHUD-14 Pre-Screening Questions:   Do you currently have a fever? no  Have you recently travelled on a cruise, internationally, or to [place], [place], [place], [place], [place], or [place], [place]) ? no  Have you been in contact with someone that is currently pending confirmation of Covid19 testing or has been confirmed to have the [place] virus?  No Are you currently experiencing fatigue or cough? no

## 2018-11-25 ENCOUNTER — Ambulatory Visit: Payer: Medicare Other | Admitting: Cardiology

## 2018-11-26 ENCOUNTER — Telehealth: Payer: Medicare Other | Admitting: Cardiology

## 2018-11-26 ENCOUNTER — Telehealth (INDEPENDENT_AMBULATORY_CARE_PROVIDER_SITE_OTHER): Payer: Medicare Other | Admitting: Cardiology

## 2018-11-26 ENCOUNTER — Encounter: Payer: Self-pay | Admitting: Cardiology

## 2018-11-26 VITALS — BP 152/56 | HR 70 | Ht 67.75 in | Wt 162.0 lb

## 2018-11-26 DIAGNOSIS — I351 Nonrheumatic aortic (valve) insufficiency: Secondary | ICD-10-CM

## 2018-11-26 DIAGNOSIS — E7849 Other hyperlipidemia: Secondary | ICD-10-CM

## 2018-11-26 DIAGNOSIS — I10 Essential (primary) hypertension: Secondary | ICD-10-CM

## 2018-11-26 DIAGNOSIS — I350 Nonrheumatic aortic (valve) stenosis: Secondary | ICD-10-CM

## 2018-11-26 NOTE — Progress Notes (Signed)
Virtual Visit via Telephone Note    Evaluation Performed:  Follow-up visit  This visit type was conducted due to national recommendations for restrictions regarding the COVID-19 Pandemic (e.g. social distancing).  This format is felt to be most appropriate for this patient at this time.  All issues noted in this document were discussed and addressed.  No physical exam was performed (except for noted visual exam findings with Video Visits).  Please refer to the patient's chart (MyChart message for video visits and phone note for telephone visits) for the patient's consent to telehealth for [facility].  Date:  11/26/2018   ID:  Anson [place], DOB [birth year], MRN [id number]  Patient Location:  Home  Provider location:   [place]  PCP:  Ocie Doyne., MD  Cardiologist:  No primary care provider on file. Dr Bettina Gavia Electrophysiologist:  None   Chief Complaint: Aortic regurgitation  History of Present Illness:    Bruce Nunez is a 78 y.o. male who presents via audio/video conferencing for a telehealth visit today.  This is a regularly scheduled follow-up visit  The patient does not have symptoms concerning for COVID-19 infection (fever, chills, cough, or new shortness of breath).   TREMEL SETTERS is a 78 y.o. male with a hx of hypertension, hyperlipidemia , T2 DM and aortic regurgitation last seen 03/12/2017.  Overall he is done well he has chronic exercise intolerance when he goes outdoors pushes of mower bending over planting in his garden he would develop shortness of breath stop and rest and has relief.  The pattern is unchanged he has no edema orthopnea chest pain palpitation or syncope.  Unfortunately is not checking his blood pressure at home and he takes a combination of 4 agents ARB beta-blocker calcium channel blocker and HCTZ.  BP is mildly elevated I asked him to check daily at rest 2 weeks contact us if systolics remain greater than 140.  I reviewed his last echocardiogram  with him he did not appear to require valvular intervention but needs a repeat echocardiogram in July and follow-up with me afterwards.  He has had no fever chills or cough.  His COPD is stable and uses bronchodilator  Prior CV studies:   The following studies were reviewed today:  Study Date: 03/21/2017 Study Conclusions - Left ventricle: The cavity size was normal. There was mild   concentric hypertrophy. Systolic function was normal. The   estimated ejection fraction was in the range of 60% to 65%. Wall   motion was normal; there were no regional wall motion   abnormalities. Features are consistent with a pseudonormal left   ventricular filling pattern, with concomitant abnormal relaxation   and increased filling pressure (grade 2 diastolic dysfunction).   Doppler parameters are consistent with high ventricular filling   pressure. - Aortic valve: Moderately to severely calcified annulus.   Trileaflet; normal thickness, mildly calcified leaflets. There   was very mild stenosis. There was moderate regurgitation. Valve   area (VTI): 1.44 cm^2. Valve area (Vmax): 1.29 cm^2. Valve area   (Vmean): 1.39 cm^2. Regurgitation pressure half-time: 332 ms. - Mitral valve: There was mild regurgitation.   Past Medical History:  Diagnosis Date  . Anemia    recent iron supplement  . Asthma    childhood-   2013 bronchitis w/wheezing  . Complication of anesthesia    " ether made me sick" - "woke up during colonoscopy"  . Diabetes mellitus without complication ([place])   . Frequency of  urination   . GERD (gastroesophageal reflux disease)    occasional  . Heart murmur   . Hemorrhoids   . History of kidney stones   . Hx of colon cancer, stage I   . Hx of nonmelanoma skin cancer   . Hx of peptic ulcer    as teenager  . Hx of transfusion   . Hyperlipidemia   . Hypertension   . Inguinal hernia   . PONV (postoperative nausea and vomiting)    only when had Ether as Anesthesia drug  . Prostate  cancer ([place]) 08/15/12   also Hx colon cancer / Hx skin cancer  . Right inguinal hernia 05/08/2016  . S/P right inguinal hernia repair Sept 2015 05/17/2014   Recurrent right indirect  Hernia treated with robotic TAPP repair with enterolysis Sept 2017   Past Surgical History:  Procedure Laterality Date  . cataract surgery  2009  . CYSTOSCOPY    . INGUINAL HERNIA REPAIR Right 05/17/2014   Procedure: RIGHT INGUINAL HERNIA REPAIR ;  Surgeon: Kaylyn Lim, MD;  Location: [facility];  Service: General;  Laterality: Right;  With [place] Right 2012  . PARTIAL COLECTOMY  2005  . 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 RADIOACTIVE SEED IMPLANT N/A 11/24/2012   Procedure: RADIOACTIVE SEED IMPLANT;  Surgeon: Bernestine Amass, MD;  Location: [facility];  Service: Urology;  Laterality: N/A;   71seeds implanted  no seeds found in bladder  . THROAT SURGERY  1940's   as child (age 3)growth that created fissure/corrective surgery  . TONSILLECTOMY     age 3  . XI ROBOTIC ASSISTED INGUINAL HERNIA REPAIR WITH MESH Right 05/08/2016   Procedure: XI ROBOTIC ASSISTED REPAIR OF RECURRENT RIGHT INGUINAL HERNIA;  Surgeon: Johnathan Hausen, MD;  Location: [facility];  Service: General;  Laterality: Right;  With MESH     Current Meds  Medication Sig  . albuterol (PROVENTIL HFA;VENTOLIN HFA) 108 (90 BASE) MCG/ACT inhaler Inhale 2 puffs into the lungs every 6 (six) hours as needed for wheezing.  Marland Kitchen amLODipine (NORVASC) 10 MG tablet Take 10 mg by mouth daily.  . bisoprolol-hydrochlorothiazide (ZIAC) 10-6.25 MG per tablet Take 1 tablet by mouth every morning.   . fesoterodine (TOVIAZ) 4 MG TB24 tablet Take 4 mg by mouth daily.  . fexofenadine (ALLEGRA) 180 MG tablet Take 180 mg by mouth daily.  . fluticasone (FLONASE) 50 MCG/ACT nasal spray Place 1 spray into both nostrils daily.   . fluticasone-salmeterol (ADVAIR HFA) 115-21 MCG/ACT inhaler Inhale 2 puffs into the lungs 2 (two) times daily.  Marland Kitchen  levothyroxine (SYNTHROID, LEVOTHROID) 25 MCG tablet Take 25 mcg by mouth daily before breakfast. Takes every other day  . losartan (COZAAR) 100 MG tablet Take 100 mg by mouth every morning.   . lovastatin (MEVACOR) 10 MG tablet TAKE ONE TABLET BY MOUTH ON MONDAY, WEDNESDAY, AND FRIDAY ONLY  . metFORMIN (GLUCOPHAGE) 500 MG tablet Take 500 mg by mouth 2 (two) times daily with a meal.   . Probiotic Product (PROBIOTIC DAILY PO) Take 1 capsule by mouth daily.  . Tamsulosin HCl (FLOMAX) 0.4 MG CAPS Take 0.4 mg by mouth 2 (two) times daily.   . Vitamin D, Ergocalciferol, (DRISDOL) 1.25 MG (50000 UT) CAPS capsule Take 50,000 Units by mouth once a week.     Allergies:   Penicillins; Aspirin; Monodox [doxycycline hyclate]; Sulfa antibiotics; and Tramadol   Social History   Tobacco Use  . Smoking status:  Never Smoker  . Smokeless tobacco: Never Used  Substance Use Topics  . Alcohol use: No  . Drug use: No     Family Hx: The patient's family history includes Brain cancer in an other family member; Coronary artery disease in an other family member; Heart attack in an other family member; Prostate cancer in an other family member.  ROS:   Please see the history of present illness.    Lab work done at his PCP office January shows a cholesterol 164 HDL 49 LDL 94 and A1c was 7%.  CMP otherwise normal including liver renal function All other systems reviewed and are negative.   Labs/Other Tests and Data Reviewed:    Recent Labs: No results found for requested labs within last 8760 hours.   Recent Lipid Panel No results found for: CHOL, TRIG, HDL, CHOLHDL, LDLCALC, LDLDIRECT  Wt Readings from Last 3 Encounters:  11/26/18 162 lb (73.5 kg)  03/12/17 160 lb 6.4 oz (72.8 kg)  05/08/16 161 lb (73 kg)     Objective:    Vital Signs:  BP (!) 152/56 (BP Location: Left Arm, Patient Position: Sitting)   Pulse 70   Ht 5' 7.75" (1.721 m)   Wt 162 lb (73.5 kg)   BMI 24.81 kg/m      ASSESSMENT &  PLAN:    1.  Aortic insufficiency asymptomatic moderate requires follow-up echocardiogram and office follow-up.  I do not think his chronic shortness of breath is cardiac in etiology when seen in the office we will also check a proBNP level. 2.  Aortic stenosis, generally progression is quite slow needs follow-up echocardiogram and if he requires intervention for his aortic valve disease potentially be a candidate for TAVR over surgical aortic valve replacement.  He is not having the typical symptoms of syncope heart failure or angina. 3.  Hypertension uncontrolled concern is not well controlled check blood pressure 2 weeks and if additional therapy is needed add low-dose clonidine at bedtime. 4.  Hyperlipidemia stable lipids at target continue his low intensity statin  COVID-19 Education: The signs and symptoms of COVID-19 were discussed with the patient and how to seek care for testing (follow up with PCP or arrange E-visit).  The importance of social distancing was discussed today.  Patient Risk:   After full review of this patient's clinical status, I feel that they are at least moderate risk at this time.  Time:   Today, I have spent 26 minutes with the patient with telehealth technology discussing the natural history of aortic valve disease importance of hypertension and hyperlipidemia control the need for follow-up occluding echocardiogram and office visit once were outside of the pandemic of coronavirus.  Patient had many questions about his echocardiogram and voiced understanding after discussion..     Medication Adjustments/Labs and Tests Ordered: Current medicines are reviewed at length with the patient today.  Concerns regarding medicines are outlined above.  Tests Ordered: No orders of the defined types were placed in this encounter.  Medication Changes: No orders of the defined types were placed in this encounter.   Disposition:  Follow up July 2020  SignedShirlee More,  MD  11/26/2018 3:38 PM    [place]

## 2018-11-26 NOTE — Patient Instructions (Addendum)
Medication Instructions:  Your physician recommends that you continue on your current medications as directed. Please refer to the Current Medication list given to you today.  Your physician has requested that you regularly monitor and record your blood pressure readings at home for the next two weeks. Please use the same machine at the same time of day to check your readings and record them.  If your systolic blood pressure (top number) stays higher than 140, please contact our office at [phone].    If you need a refill on your cardiac medications before your next appointment, please call your pharmacy.   Lab work: NONE If you have labs (blood work) drawn today and your tests are completely normal, you will receive your results only by: Marland Kitchen MyChart Message (if you have MyChart) OR . A paper copy in the mail If you have any lab test that is abnormal or we need to change your treatment, we will call you to review the results.  Testing/Procedures: Your physician has requested that you have an echocardiogram. Echocardiography is a painless test that uses sound waves to create images of your heart. It provides your doctor with information about the size and shape of your heart and how well your heart's chambers and valves are working. This procedure takes approximately one hour. There are no restrictions for this procedure.    Follow-Up: At [facility], you and your health needs are our priority.  As part of our continuing mission to provide you with exceptional heart care, we have created designated Provider Care Teams.  These Care Teams include your primary Cardiologist (physician) and Advanced Practice Providers (APPs -  Physician Assistants and Nurse Practitioners) who all work together to provide you with the care you need, when you need it. You will need a follow up appointment on July 17 , 2020 at 9:40am.  Bruce Nunez will have an echocardiogram on March 04, 2019 at 1:15 pm

## 2018-11-26 NOTE — Addendum Note (Signed)
Addended by: Stevan Born on: 11/26/2018 04:08 PM   Modules accepted: Orders

## 2018-12-01 DIAGNOSIS — J301 Allergic rhinitis due to pollen: Secondary | ICD-10-CM | POA: Diagnosis not present

## 2018-12-01 DIAGNOSIS — E063 Autoimmune thyroiditis: Secondary | ICD-10-CM | POA: Diagnosis not present

## 2018-12-01 DIAGNOSIS — J45909 Unspecified asthma, uncomplicated: Secondary | ICD-10-CM | POA: Diagnosis not present

## 2018-12-01 DIAGNOSIS — K210 Gastro-esophageal reflux disease with esophagitis: Secondary | ICD-10-CM | POA: Diagnosis not present

## 2018-12-01 DIAGNOSIS — E559 Vitamin D deficiency, unspecified: Secondary | ICD-10-CM | POA: Diagnosis not present

## 2018-12-15 ENCOUNTER — Telehealth: Payer: Self-pay | Admitting: Cardiology

## 2018-12-15 NOTE — Telephone Encounter (Signed)
Good , no change in meds

## 2018-12-15 NOTE — Telephone Encounter (Signed)
Phoned patient who was not at home and spoke with wife (OK per DPR), informed her that Dr. Bettina Gavia happy with his blood pressures and wants to continue his current medication therapy. She verbalized understanding of this and will let Yvone Neu know.

## 2018-12-15 NOTE — Telephone Encounter (Signed)
Called to give BP readings.  Please call patient back at [phone]

## 2018-12-15 NOTE — Telephone Encounter (Signed)
Patient calling as instructed to report BP's above 349 systolic since last office visit. Patient checks am BP 1 hour after taking meds.  SBP normally runs between 130's-140's                                                   Hightest BP's last 2 weeks. 11/28/18 142/46 am                                                                                                4/4      145/52 am, 145/51 pm                                                                                                4/5      141/53                                                                                                4/9      145/53 am                                                                                                4/14    141/53 am    Lowest BP's over last 2 weeks   113/43 on 4/13                                                        126/51 on 4/5  124/45 on 4/17

## 2018-12-29 DIAGNOSIS — L578 Other skin changes due to chronic exposure to nonionizing radiation: Secondary | ICD-10-CM | POA: Diagnosis not present

## 2018-12-29 DIAGNOSIS — L821 Other seborrheic keratosis: Secondary | ICD-10-CM | POA: Diagnosis not present

## 2018-12-29 DIAGNOSIS — L570 Actinic keratosis: Secondary | ICD-10-CM | POA: Diagnosis not present

## 2019-02-03 DIAGNOSIS — E559 Vitamin D deficiency, unspecified: Secondary | ICD-10-CM | POA: Diagnosis not present

## 2019-02-03 DIAGNOSIS — E063 Autoimmune thyroiditis: Secondary | ICD-10-CM | POA: Diagnosis not present

## 2019-02-03 DIAGNOSIS — E118 Type 2 diabetes mellitus with unspecified complications: Secondary | ICD-10-CM | POA: Diagnosis not present

## 2019-02-03 DIAGNOSIS — Z79899 Other long term (current) drug therapy: Secondary | ICD-10-CM | POA: Diagnosis not present

## 2019-02-03 DIAGNOSIS — E785 Hyperlipidemia, unspecified: Secondary | ICD-10-CM | POA: Diagnosis not present

## 2019-02-03 DIAGNOSIS — R079 Chest pain, unspecified: Secondary | ICD-10-CM | POA: Diagnosis not present

## 2019-02-05 DIAGNOSIS — R079 Chest pain, unspecified: Secondary | ICD-10-CM

## 2019-02-13 ENCOUNTER — Ambulatory Visit: Payer: Medicare Other | Admitting: Cardiology

## 2019-02-20 DIAGNOSIS — Z23 Encounter for immunization: Secondary | ICD-10-CM | POA: Diagnosis not present

## 2019-02-20 DIAGNOSIS — S61412A Laceration without foreign body of left hand, initial encounter: Secondary | ICD-10-CM | POA: Diagnosis not present

## 2019-02-20 DIAGNOSIS — S61211A Laceration without foreign body of left index finger without damage to nail, initial encounter: Secondary | ICD-10-CM | POA: Diagnosis not present

## 2019-02-20 DIAGNOSIS — S61012A Laceration without foreign body of left thumb without damage to nail, initial encounter: Secondary | ICD-10-CM | POA: Diagnosis not present

## 2019-02-23 DIAGNOSIS — Z961 Presence of intraocular lens: Secondary | ICD-10-CM | POA: Diagnosis not present

## 2019-02-23 DIAGNOSIS — H40013 Open angle with borderline findings, low risk, bilateral: Secondary | ICD-10-CM | POA: Diagnosis not present

## 2019-02-23 DIAGNOSIS — E119 Type 2 diabetes mellitus without complications: Secondary | ICD-10-CM | POA: Diagnosis not present

## 2019-02-23 DIAGNOSIS — H35033 Hypertensive retinopathy, bilateral: Secondary | ICD-10-CM | POA: Diagnosis not present

## 2019-03-03 DIAGNOSIS — I10 Essential (primary) hypertension: Secondary | ICD-10-CM | POA: Diagnosis not present

## 2019-03-03 DIAGNOSIS — Z1331 Encounter for screening for depression: Secondary | ICD-10-CM | POA: Diagnosis not present

## 2019-03-03 DIAGNOSIS — E538 Deficiency of other specified B group vitamins: Secondary | ICD-10-CM | POA: Diagnosis not present

## 2019-03-03 DIAGNOSIS — Z9181 History of falling: Secondary | ICD-10-CM | POA: Diagnosis not present

## 2019-03-04 ENCOUNTER — Other Ambulatory Visit: Payer: Medicare Other

## 2019-03-13 ENCOUNTER — Ambulatory Visit: Payer: Medicare Other | Admitting: Cardiology

## 2019-03-20 ENCOUNTER — Other Ambulatory Visit: Payer: Self-pay

## 2019-03-20 ENCOUNTER — Ambulatory Visit (INDEPENDENT_AMBULATORY_CARE_PROVIDER_SITE_OTHER): Payer: Medicare Other

## 2019-03-20 DIAGNOSIS — I351 Nonrheumatic aortic (valve) insufficiency: Secondary | ICD-10-CM | POA: Diagnosis not present

## 2019-03-20 DIAGNOSIS — I350 Nonrheumatic aortic (valve) stenosis: Secondary | ICD-10-CM | POA: Diagnosis not present

## 2019-03-20 NOTE — Progress Notes (Signed)
Complete echocardiogram has been performed.  Jimmy Loveta Dellis RDCS, RVT 

## 2019-03-23 NOTE — Progress Notes (Signed)
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 Nonrheumatic aortic valve insufficiency   3. Nonrheumatic aortic valve stenosis   4. Essential hypertension    PLAN:    In order of problems listed above:  1. 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  He underwent a stress echo at [facility] 02/05/2019 that was negative for ischemia.  He is aspirin intolerant with rectal bleeding.  I told her the best interpretation of his chest pain pattern is he is stable angina pectoris continue current medical treatment given a prescription for nitroglycerin as needed and after discussion of his concerns with inability to finish garden work a trial of oral mononitrate's.  See back in my office in 6 weeks to assess his response 2. His aortic valve disease is stable on repeat echocardiogram 3. Hypertension stable continue current treatment including beta-blocker thiazide diuretic calcium channel blocker and ARB 4. Stable dyslipidemia continue a statin his LDL is at target 84 106 04/2019 cholesterol 147 HDL 30 at that time creatinine 1.29 hemoglobin 13.8 A1c 6.1.   Next appointment: 6 weeks   Medication Adjustments/Labs and Tests Ordered: Current medicines are reviewed at length with the patient today.  Concerns regarding medicines are outlined above.  No orders of the defined types were placed in this encounter.  No orders of the defined types were placed in this encounter.   Chief Complaint  Patient presents with   Follow-up   Aortic  Stenosis   Chest Pain    History of Present Illness:    Bruce Nunez is a 78 y.o. male with a hx of hypertension, hyperlipidemia , T2 DM and aortic regurgitation  last seen 11/26/2018.  Echocardiogram 03/20/2019 showed normal left ventricular size and function EF 60 to 65% aortic valve is mildly thickened moderately calcified moderate regurgitation pressure half-time 278 ms and mild stenosis peak and mean gradients 27 and 15 mmHg and VTI ratio 0.56.  On review of echocardiogram 03/21/2017 there is been no progression of aortic valve disease. Compliance with diet, lifestyle and medications: Yes  Is a chronic chest pain pattern consistent with stable angina pectoris when he does vigorous heavy gardening work he has precordial pressure relieved with rest.  In the last month the symptoms have improved but persisted and happened last week when he worked outdoors.  He underwent a stress echo during COVID-19 after testing that was negative for ischemia at 9 minutes 10 minutes.  We discussed options will go and avoid aspirin with rectal bleeding continue medical treatment including beta-blocker statin add oral nitrates and as needed nitroglycerin reassess response in 6 weeks his goals to be able to walk outdoors without stopping Past Medical History:  Diagnosis Date   Anemia    recent iron supplement   Asthma    childhood-   2013 bronchitis w/wheezing   Complication of anesthesia    " ether made me sick" - "woke up during colonoscopy"   Diabetes mellitus  without complication ([facility])    Frequency of urination    GERD (gastroesophageal reflux disease)    occasional   Heart murmur    Hemorrhoids    History of kidney stones    Hx of colon cancer, stage I    Hx of nonmelanoma skin cancer    Hx of peptic ulcer    as teenager   Hx of transfusion    Hyperlipidemia    Hypertension    Inguinal hernia    PONV (postoperative nausea and vomiting)    only when had Ether as Anesthesia  drug   Prostate cancer ([place]) 08/15/12   also Hx colon cancer / Hx skin cancer   Right inguinal hernia 05/08/2016   S/P right inguinal hernia repair Sept 2015 05/17/2014   Recurrent right indirect  Hernia treated with robotic TAPP repair with enterolysis Sept 2017    Past Surgical History:  Procedure Laterality Date   cataract surgery  2009   CYSTOSCOPY     INGUINAL HERNIA REPAIR Right 05/17/2014   Procedure: RIGHT INGUINAL HERNIA REPAIR ;  Surgeon: Kaylyn Lim, MD;  Location: [facility];  Service: General;  Laterality: Right;  With MESH   KNEE SURGERY Right 2012   PARTIAL COLECTOMY  2005   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   RADIOACTIVE SEED IMPLANT N/A 11/24/2012   Procedure: RADIOACTIVE SEED IMPLANT;  Surgeon: Bernestine Amass, MD;  [facility];  Service: Urology;  Laterality: N/A;   71seeds implanted  no seeds found in bladder   THROAT SURGERY  1940's   as child (age 86)growth that created fissure/corrective surgery   TONSILLECTOMY     age 71   XI ROBOTIC [place] Right 05/08/2016   Procedure: XI ROBOTIC ASSISTED REPAIR OF RECURRENT RIGHT INGUINAL HERNIA;  Surgeon: Johnathan Hausen, MD;  Location: [facility];  Service: General;  Laterality: Right;  With MESH    Current Medications: Current Meds  Medication Sig   albuterol (PROVENTIL HFA;VENTOLIN HFA) 108 (90 BASE) MCG/ACT inhaler Inhale 2 puffs into the lungs every 6 (six) hours as needed for wheezing.   amLODipine (NORVASC) 5 MG tablet Take 5 mg by mouth daily.    bisoprolol-hydrochlorothiazide (ZIAC) 10-6.25 MG per tablet Take 1 tablet by mouth every morning.    cyanocobalamin (,VITAMIN B-12,) 1000 MCG/ML injection Inject 1 mL into the skin every 14 (fourteen) days.   fesoterodine (TOVIAZ) 4 MG TB24 tablet Take 4 mg by mouth daily.   fexofenadine (ALLEGRA) 180 MG tablet Take 180 mg by mouth daily.   fluticasone (FLONASE) 50 MCG/ACT nasal spray Place 1  spray into both nostrils daily.    fluticasone-salmeterol (ADVAIR HFA) 115-21 MCG/ACT inhaler Inhale 2 puffs into the lungs 2 (two) times daily.   levothyroxine (SYNTHROID, LEVOTHROID) 25 MCG tablet Take 37.5 mcg by mouth daily before breakfast.    Loperamide HCl (IMODIUM A-D PO) Take 1 capsule by mouth as needed.   losartan (COZAAR) 100 MG tablet Take 100 mg by mouth every morning.    lovastatin (MEVACOR) 10 MG tablet TAKE ONE TABLET BY MOUTH ON MONDAY, WEDNESDAY, AND FRIDAY ONLY   metFORMIN (GLUCOPHAGE) 500 MG tablet Take 500 mg by mouth 2 (two) times daily with a meal.    omeprazole (PRILOSEC) 20 MG capsule Take 20 mg by mouth daily.   Probiotic Product (PROBIOTIC DAILY PO) Take 1 capsule by mouth daily.   Tamsulosin HCl (FLOMAX) 0.4 MG CAPS Take 0.4 mg by mouth  2 (two) times daily.    Vitamin D, Ergocalciferol, (DRISDOL) 1.25 MG (50000 UT) CAPS capsule Take 50,000 Units by mouth once a week.     Allergies:   Penicillins, Aspirin, Monodox [doxycycline hyclate], Sulfa antibiotics, and Tramadol   Social History   Socioeconomic History   Marital status: Married    Spouse name: Not on file   Number of children: 2   Years of education: Not on file   Highest education level: Not on file  Occupational History   Occupation: Retired    Comment: Environmental manager strain: Not on file   Food insecurity    Worry: Not on file    Inability: Not on Lexicographer needs    Medical: Not on file    Non-medical: Not on file  Tobacco Use   Smoking status: Never Smoker   Smokeless tobacco: Never Used  Substance and Sexual Activity   Alcohol use: No   Drug use: No   Sexual activity: Not on file  Lifestyle   Physical activity    Days per week: Not on file    Minutes per session: Not on file   Stress: Not on file  Relationships   Social connections    Talks on phone: Not on file    Gets together: Not on file    Attends religious  service: Not on file    Active member of club or organization: Not on file    Attends meetings of clubs or organizations: Not on file    Relationship status: Not on file  Other Topics Concern   Not on file  Social History Narrative   Not on file     Family History: The patient's family history includes Brain cancer in an other family member; Coronary artery disease in an other family member; Heart attack in an other family member; Prostate cancer in an other family member. ROS:   Please see the history of present illness.    All other systems reviewed and are negative.  EKGs/Labs/Other Studies Reviewed:    The following studies were reviewed today:  EKG:  EKG ordered today and personally reviewed.  The ekg ordered today demonstrates .sinus rhythm and is normal rcent Labs: No results found for requested labs within last 8760 hours.  Recent Lipid Panel No results found for: CHOL, TRIG, HDL, CHOLHDL, VLDL, LDLCALC, LDLDIRECT  Physical Exam:    VS:  BP (!) 110/58 (BP Location: Right Arm, Patient Position: Sitting, Cuff Size: Normal)    Pulse 73    Ht 5' 7.75" (1.721 m)    Wt 163 lb 3.2 oz (74 kg)    SpO2 97%    BMI 25.00 kg/m     Wt Readings from Last 3 Encounters:  03/24/19 163 lb 3.2 oz (74 kg)  11/26/18 162 lb (73.5 kg)  03/12/17 160 lb 6.4 oz (72.8 kg)     GEN:  Well nourished, well developed in no acute distress HEENT: Normal NECK: No JVD; No carotid bruits LYMPHATICS: No lymphadenopathy CARDIAC: 1/6 to 2/6 systolic ejection murmur aortic area does not encompass S2 RRR, no murmurs, rubs, gallops RESPIRATORY:  Clear to auscultation without rales, wheezing or rhonchi  ABDOMEN: Soft, non-tender, non-distended MUSCULOSKELETAL:  No edema; No deformity  SKIN: Warm and dry NEUROLOGIC:  Alert and oriented x 3 PSYCHIATRIC:  Normal affect    Signed, Shirlee More, MD  03/24/2019 12:25 PM    [facility]

## 2019-03-24 ENCOUNTER — Encounter: Payer: Self-pay | Admitting: Cardiology

## 2019-03-24 ENCOUNTER — Ambulatory Visit: Payer: Medicare Other | Admitting: Cardiology

## 2019-03-24 ENCOUNTER — Ambulatory Visit (INDEPENDENT_AMBULATORY_CARE_PROVIDER_SITE_OTHER): Payer: Medicare Other | Admitting: Cardiology

## 2019-03-24 VITALS — BP 110/58 | HR 73 | Ht 67.75 in | Wt 163.2 lb

## 2019-03-24 DIAGNOSIS — I10 Essential (primary) hypertension: Secondary | ICD-10-CM

## 2019-03-24 DIAGNOSIS — I209 Angina pectoris, unspecified: Secondary | ICD-10-CM | POA: Diagnosis not present

## 2019-03-24 DIAGNOSIS — I350 Nonrheumatic aortic (valve) stenosis: Secondary | ICD-10-CM

## 2019-03-24 DIAGNOSIS — I351 Nonrheumatic aortic (valve) insufficiency: Secondary | ICD-10-CM

## 2019-03-24 MED ORDER — ISOSORBIDE MONONITRATE ER 30 MG PO TB24: 30.0000 mg | ORAL_TABLET | Freq: Every day | ORAL | 3 refills | Status: DC

## 2019-03-24 MED ORDER — NITROGLYCERIN 0.4 MG SL SUBL: 0.4000 mg | SUBLINGUAL_TABLET | SUBLINGUAL | 3 refills | Status: DC | PRN

## 2019-03-24 NOTE — Patient Instructions (Signed)
Medication Instructions:  Your physician has recommended you make the following change in your medication:   START nitroglycerin as needed for chest pain: When having chest pain, stop what you are doing and sit down. Take 1 nitro, wait 5 minutes. Still having chest pain, take 1 nitro, wait 5 minutes. Still having chest pain, take 1 nitro, dial 911. Total of 3 nitro in 15 minutes.  START isosorbide mononitrate (imdur) 30 mg: Take 1 tablet daily   If you need a refill on your cardiac medications before your next appointment, please call your pharmacy.   Lab work: None  If you have labs (blood work) drawn today and your tests are completely normal, you will receive your results only by: Bruce Nunez MyChart Message (if you have MyChart) OR . A paper copy in the mail If you have any lab test that is abnormal or we need to change your treatment, we will call you to review the results.  Testing/Procedures: You had an EKG today.   Follow-Up: At [facility], you and your health needs are our priority.  As part of our continuing mission to provide you with exceptional heart care, we have created designated Provider Care Teams.  These Care Teams include your primary Cardiologist (physician) and Advanced Practice Providers (APPs -  Physician Assistants and Nurse Practitioners) who all work together to provide you with the care you need, when you need it. You will need a follow up appointment in 6 weeks.     Isosorbide Mononitrate extended-release tablets What is this medicine? ISOSORBIDE MONONITRATE (eye soe SOR bide mon oh NYE trate) is a vasodilator. It relaxes blood vessels, increasing the blood and oxygen supply to your heart. This medicine is used to prevent chest pain caused by angina. It will not help to stop an episode of chest pain. This medicine may be used for other purposes; ask your health care provider or pharmacist if you have questions. COMMON BRAND NAME(S): Imdur, Isotrate ER What should  I tell my health care provider before I take this medicine? They need to know if you have any of these conditions:  previous heart attack or heart failure  an unusual or allergic reaction to isosorbide mononitrate, nitrates, other medicines, foods, dyes, or preservatives  pregnant or trying to get pregnant  breast-feeding How should I use this medicine? Take this medicine by mouth with a glass of water. Follow the directions on the prescription label. Do not crush or chew. Take your medicine at regular intervals. Do not take your medicine more often than directed. Do not stop taking this medicine except on the advice of your doctor or health care professional. Talk to your pediatrician regarding the use of this medicine in children. Special care may be needed. Overdosage: If you think you have taken too much of this medicine contact a poison control center or emergency room at once. NOTE: This medicine is only for you. Do not share this medicine with others. What if I miss a dose? If you miss a dose, take it as soon as you can. If it is almost time for your next dose, take only that dose. Do not take double or extra doses. What may interact with this medicine? Do not take this medicine with any of the following medications:  medicines used to treat erectile dysfunction (ED) like avanafil, sildenafil, tadalafil, and vardenafil  riociguat This medicine may also interact with the following medications:  medicines for high blood pressure  other medicines for angina or  heart failure This list may not describe all possible interactions. Give your health care provider a list of all the medicines, herbs, non-prescription drugs, or dietary supplements you use. Also tell them if you smoke, drink alcohol, or use illegal drugs. Some items may interact with your medicine. What should I watch for while using this medicine? Check your heart rate and blood pressure regularly while you are taking this  medicine. Ask your doctor or health care professional what your heart rate and blood pressure should be and when you should contact him or her. Tell your doctor or health care professional if you feel your medicine is no longer working. You may get dizzy. Do not drive, use machinery, or do anything that needs mental alertness until you know how this medicine affects you. To reduce the risk of dizzy or fainting spells, do not sit or stand up quickly, especially if you are an older patient. Alcohol can make you more dizzy, and increase flushing and rapid heartbeats. Avoid alcoholic drinks. Do not treat yourself for coughs, colds, or pain while you are taking this medicine without asking your doctor or health care professional for advice. Some ingredients may increase your blood pressure. What side effects may I notice from receiving this medicine? Side effects that you should report to your doctor or health care professional as soon as possible:  bluish discoloration of lips, fingernails, or palms of hands  irregular heartbeat, palpitations  low blood pressure  nausea, vomiting  persistent headache  unusually weak or tired Side effects that usually do not require medical attention (report to your doctor or health care professional if they continue or are bothersome):  flushing of the face or neck  rash This list may not describe all possible side effects. Call your doctor for medical advice about side effects. You may report side effects to FDA at 1-800-FDA-1088. Where should I keep my medicine? Keep out of the reach of children. Store between 15 and 30 degrees C (59 and 86 degrees F). Keep container tightly closed. Throw away any unused medicine after the expiration date. NOTE: This sheet is a summary. It may not cover all possible information. If you have questions about this medicine, talk to your doctor, pharmacist, or health care provider.  2020 Elsevier/Gold Standard (2013-06-12  14:48:19)    Nitroglycerin sublingual tablets What is this medicine? NITROGLYCERIN (nye troe GLI ser in) is a type of vasodilator. It relaxes blood vessels, increasing the blood and oxygen supply to your heart. This medicine is used to relieve chest pain caused by angina. It is also used to prevent chest pain before activities like climbing stairs, going outdoors in cold weather, or sexual activity. This medicine may be used for other purposes; ask your health care provider or pharmacist if you have questions. COMMON BRAND NAME(S): Nitroquick, Nitrostat, Nitrotab What should I tell my health care provider before I take this medicine? They need to know if you have any of these conditions:  anemia  head injury, recent stroke, or bleeding in the brain  liver disease  previous heart attack  an unusual or allergic reaction to nitroglycerin, other medicines, foods, dyes, or preservatives  pregnant or trying to get pregnant  breast-feeding How should I use this medicine? Take this medicine by mouth as needed. At the first sign of an angina attack (chest pain or tightness) place one tablet under your tongue. You can also take this medicine 5 to 10 minutes before an event likely to produce chest  pain. Follow the directions on the prescription label. Let the tablet dissolve under the tongue. Do not swallow whole. Replace the dose if you accidentally swallow it. It will help if your mouth is not dry. Saliva around the tablet will help it to dissolve more quickly. Do not eat or drink, smoke or chew tobacco while a tablet is dissolving. If you are not better within 5 minutes after taking ONE dose of nitroglycerin, call 9-1-1 immediately to seek emergency medical care. Do not take more than 3 nitroglycerin tablets over 15 minutes. If you take this medicine often to relieve symptoms of angina, your doctor or health care professional may provide you with different instructions to manage your symptoms. If  symptoms do not go away after following these instructions, it is important to call 9-1-1 immediately. Do not take more than 3 nitroglycerin tablets over 15 minutes. Talk to your pediatrician regarding the use of this medicine in children. Special care may be needed. Overdosage: If you think you have taken too much of this medicine contact a poison control center or emergency room at once. NOTE: This medicine is only for you. Do not share this medicine with others. What if I miss a dose? This does not apply. This medicine is only used as needed. What may interact with this medicine? Do not take this medicine with any of the following medications:  certain migraine medicines like ergotamine and dihydroergotamine (DHE)  medicines used to treat erectile dysfunction like sildenafil, tadalafil, and vardenafil  riociguat This medicine may also interact with the following medications:  alteplase  aspirin  heparin  medicines for high blood pressure  medicines for mental depression  other medicines used to treat angina  phenothiazines like chlorpromazine, mesoridazine, prochlorperazine, thioridazine This list may not describe all possible interactions. Give your health care provider a list of all the medicines, herbs, non-prescription drugs, or dietary supplements you use. Also tell them if you smoke, drink alcohol, or use illegal drugs. Some items may interact with your medicine. What should I watch for while using this medicine? Tell your doctor or health care professional if you feel your medicine is no longer working. Keep this medicine with you at all times. Sit or lie down when you take your medicine to prevent falling if you feel dizzy or faint after using it. Try to remain calm. This will help you to feel better faster. If you feel dizzy, take several deep breaths and lie down with your feet propped up, or bend forward with your head resting between your knees. You may get drowsy or  dizzy. Do not drive, use machinery, or do anything that needs mental alertness until you know how this drug affects you. Do not stand or sit up quickly, especially if you are an older patient. This reduces the risk of dizzy or fainting spells. Alcohol can make you more drowsy and dizzy. Avoid alcoholic drinks. Do not treat yourself for coughs, colds, or pain while you are taking this medicine without asking your doctor or health care professional for advice. Some ingredients may increase your blood pressure. What side effects may I notice from receiving this medicine? Side effects that you should report to your doctor or health care professional as soon as possible:  blurred vision  dry mouth  skin rash  sweating  the feeling of extreme pressure in the head  unusually weak or tired Side effects that usually do not require medical attention (report to your doctor or health care professional  if they continue or are bothersome):  flushing of the face or neck  headache  irregular heartbeat, palpitations  nausea, vomiting This list may not describe all possible side effects. Call your doctor for medical advice about side effects. You may report side effects to FDA at 1-800-FDA-1088. Where should I keep my medicine? Keep out of the reach of children. Store at room temperature between 20 and 25 degrees C (68 and 77 degrees F). Store in [employer] of Staff. Protect from light and moisture. Keep tightly closed. Throw away any unused medicine after the expiration date. NOTE: This sheet is a summary. It may not cover all possible information. If you have questions about this medicine, talk to your doctor, pharmacist, or health care provider.  2020 Elsevier/Gold Standard (2013-06-11 17:57:36)

## 2019-04-29 ENCOUNTER — Ambulatory Visit: Payer: Medicare Other | Admitting: Cardiology

## 2019-04-29 ENCOUNTER — Ambulatory Visit (INDEPENDENT_AMBULATORY_CARE_PROVIDER_SITE_OTHER): Payer: Medicare Other | Admitting: Cardiology

## 2019-04-29 ENCOUNTER — Other Ambulatory Visit: Payer: Self-pay

## 2019-04-29 ENCOUNTER — Encounter: Payer: Self-pay | Admitting: Cardiology

## 2019-04-29 VITALS — BP 128/50 | HR 67 | Ht 67.0 in | Wt 162.2 lb

## 2019-04-29 DIAGNOSIS — E7849 Other hyperlipidemia: Secondary | ICD-10-CM | POA: Diagnosis not present

## 2019-04-29 DIAGNOSIS — I351 Nonrheumatic aortic (valve) insufficiency: Secondary | ICD-10-CM | POA: Diagnosis not present

## 2019-04-29 DIAGNOSIS — I209 Angina pectoris, unspecified: Secondary | ICD-10-CM | POA: Diagnosis not present

## 2019-04-29 DIAGNOSIS — I350 Nonrheumatic aortic (valve) stenosis: Secondary | ICD-10-CM

## 2019-04-29 DIAGNOSIS — I10 Essential (primary) hypertension: Secondary | ICD-10-CM | POA: Diagnosis not present

## 2019-04-29 HISTORY — DX: Nonrheumatic aortic (valve) stenosis: I35.0

## 2019-04-29 MED ORDER — AMLODIPINE BESYLATE 5 MG PO TABS: 5.0000 mg | ORAL_TABLET | ORAL | Status: DC

## 2019-04-29 NOTE — Progress Notes (Signed)
Cardiology Office Note:    Date:  04/29/2019   ID:  Bruce Nunez, DOB Jun 07, 1941, MRN [id number]  PCP:  Bruce Nunez., MD  Cardiologist:  Bruce More, MD    Referring MD: Bruce Nunez., MD    ASSESSMENT:    1. Essential hypertension   2. Aortic valve stenosis, etiology of cardiac valve disease unspecified   3. Nonrheumatic aortic valve stenosis   4. Nonrheumatic aortic valve insufficiency   5. Angina pectoris ([place])   6. Other hyperlipidemia    PLAN:    In order of problems listed above:  1. Stable BP at target continue current treatment to decrease the frequency of his long-acting calcium channel blocker 2. Stable aortic stenosis aortic regurgitation will consider repeat echo around the time of next visit 3. Stable no recurrent anginal discomfort 4. Stable continue aspirin high intensity statin.   Next appointment: 6 months   Medication Adjustments/Labs and Tests Ordered: Current medicines are reviewed at length with the patient today.  Concerns regarding medicines are outlined above.  Orders Placed This Encounter  Procedures  . EKG 12-Lead   Meds ordered this encounter  Medications  . amLODipine (NORVASC) 5 MG tablet    Sig: Take 1 tablet (5 mg total) by mouth every other day.    Chief Complaint  Patient presents with  . other    6 wk f/u no complaints today. Meds reviewed verbally with pt.  . Follow-up  . Aortic Stenosis    History of Present Illness:    Bruce Nunez is a 78 y.o. male with a hx of hypertension, hyperlipidemia , T2 DM and aortic regurgitation  last seen 03/22/2019. Echocardiogram 03/20/2019 showed normal left ventricular size and function EF 60 to 65% aortic valve is mildly thickened moderately calcified moderate regurgitation pressure half-time 278 ms and mild stenosis peak and mean gradients 27 and 15 mmHg and VTI ratio 0.56.  On review of echocardiogram 03/21/2017 there is been no progression of aortic valve disease   Compliance with  diet, lifestyle and medications: yes  Overall he is done well when he is outside doing activities like mowing pushing the mower he finds himself breathless but has not been using his inhaler.  He takes a calcium channel blocker at the end of the day he has no edema at the ankle and foot he restrict sodium no orthopnea chest pain palpitation or syncope.  Clinically I do not think he has heart failure his blood pressure is ideal and would reduce the dose of his calcium channel blocker that should help reduce his peripheral edema.  His aortic valve disease stenosis and regurgitation is not severe and has not progressed.  I plan to see back in the office in 6 months.  Recent labs reviewed 02/03/2019 cholesterol 147 LDL 84 HDL 30 hemoglobin 13.8 creatinine 1.29 potassium 4.2 TSH normal.  He is pleased with the quality of his life except he is a little breathless when he does garden work and has mild dependent edema at the end of the legs typical of calcium channel blocker.  BP is at targets I will have him decrease his calcium channel blocker to every other day. Past Medical History:  Diagnosis Date  . Anemia    recent iron supplement  . Asthma    childhood-   2013 bronchitis w/wheezing  . Complication of anesthesia    " ether made me sick" - "woke up during colonoscopy"  . Diabetes mellitus without complication ([place])   .  Frequency of urination   . GERD (gastroesophageal reflux disease)    occasional  . Heart murmur   . Hemorrhoids   . History of kidney stones   . Hx of colon cancer, stage I   . Hx of nonmelanoma skin cancer   . Hx of peptic ulcer    as teenager  . Hx of transfusion   . Hyperlipidemia   . Hypertension   . Inguinal hernia   . PONV (postoperative nausea and vomiting)    only when had Ether as Anesthesia drug  . Prostate cancer ([place]) 08/15/12   also Hx colon cancer / Hx skin cancer  . Right inguinal hernia 05/08/2016  . S/P right inguinal hernia repair Sept 2015 05/17/2014    Recurrent right indirect  Hernia treated with robotic TAPP repair with enterolysis Sept 2017    Past Surgical History:  Procedure Laterality Date  . cataract surgery  2009  . CYSTOSCOPY    . INGUINAL HERNIA REPAIR Right 05/17/2014   Procedure: RIGHT INGUINAL HERNIA REPAIR ;  Surgeon: Kaylyn Lim, MD;  Location: [facility];  Service: General;  Laterality: Right;  With [place] Right 2012  . PARTIAL COLECTOMY  2005  . 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 RADIOACTIVE SEED IMPLANT N/A 11/24/2012   Procedure: RADIOACTIVE SEED IMPLANT;  Surgeon: Bernestine Amass, MD;  Location: [facility];  Service: Urology;  Laterality: N/A;   71seeds implanted  no seeds found in bladder  . THROAT SURGERY  1940's   as child (age 31)growth that created fissure/corrective surgery  . TONSILLECTOMY     age 53  . XI ROBOTIC ASSISTED INGUINAL HERNIA REPAIR WITH MESH Right 05/08/2016   Procedure: XI ROBOTIC ASSISTED REPAIR OF RECURRENT RIGHT INGUINAL HERNIA;  Surgeon: Johnathan Hausen, MD;  Location: [facility];  Service: General;  Laterality: Right;  With MESH    Current Medications: Current Meds  Medication Sig  . albuterol (PROVENTIL HFA;VENTOLIN HFA) 108 (90 BASE) MCG/ACT inhaler Inhale 2 puffs into the lungs every 6 (six) hours as needed for wheezing.  Marland Kitchen amLODipine (NORVASC) 5 MG tablet Take 1 tablet (5 mg total) by mouth every other day.  . bisoprolol-hydrochlorothiazide (ZIAC) 10-6.25 MG per tablet Take 1 tablet by mouth every morning.   . cyanocobalamin (,VITAMIN B-12,) 1000 MCG/ML injection Inject 1 mL into the skin every 30 (thirty) days.   . fesoterodine (TOVIAZ) 4 MG TB24 tablet Take 4 mg by mouth daily.  . fexofenadine (ALLEGRA) 180 MG tablet Take 180 mg by mouth daily.  . fluticasone (FLONASE) 50 MCG/ACT nasal spray Place 1 spray into both nostrils daily.   . fluticasone-salmeterol (ADVAIR HFA) 115-21 MCG/ACT inhaler Inhale 2 puffs into the lungs 2 (two) times daily.  .  isosorbide mononitrate (IMDUR) 30 MG 24 hr tablet Take 1 tablet (30 mg total) by mouth daily.  Marland Kitchen levothyroxine (SYNTHROID, LEVOTHROID) 25 MCG tablet Take 37.5 mcg by mouth daily before breakfast.   . Loperamide HCl (IMODIUM A-D PO) Take 1 capsule by mouth as needed.  Marland Kitchen losartan (COZAAR) 100 MG tablet Take 100 mg by mouth every morning.   . lovastatin (MEVACOR) 10 MG tablet TAKE ONE TABLET BY MOUTH ON MONDAY, WEDNESDAY, AND FRIDAY ONLY  . metFORMIN (GLUCOPHAGE) 500 MG tablet Take 500 mg by mouth daily with breakfast.   . nitroGLYCERIN (NITROSTAT) 0.4 MG SL tablet Place 1 tablet (0.4 mg total) under the tongue every 5 (five) minutes as needed for chest  pain.  . omeprazole (PRILOSEC) 20 MG capsule Take 20 mg by mouth daily.  . Probiotic Product (PROBIOTIC DAILY PO) Take 1 capsule by mouth daily.  . Tamsulosin HCl (FLOMAX) 0.4 MG CAPS Take 0.4 mg by mouth 2 (two) times daily.   . Vitamin D, Ergocalciferol, (DRISDOL) 1.25 MG (50000 UT) CAPS capsule Take 50,000 Units by mouth once a week.  . [DISCONTINUED] amLODipine (NORVASC) 5 MG tablet Take 5 mg by mouth daily.      Allergies:   Penicillins, Aspirin, Monodox [doxycycline hyclate], Sulfa antibiotics, and Tramadol   Social History   Socioeconomic History  . Marital status: Married    Spouse name: Not on file  . Number of children: 2  . Years of education: Not on file  . Highest education level: Not on file  Occupational History  . Occupation: Retired    Comment: Copy  . Financial resource strain: Not on file  . Food insecurity    Worry: Not on file    Inability: Not on file  . Transportation needs    Medical: Not on file    Non-medical: Not on file  Tobacco Use  . Smoking status: Never Smoker  . Smokeless tobacco: Never Used  Substance and Sexual Activity  . Alcohol use: No  . Drug use: No  . Sexual activity: Not on file  Lifestyle  . Physical activity    Days per week: Not on file    Minutes per session: Not  on file  . Stress: Not on file  Relationships  . Social Herbalist on phone: Not on file    Gets together: Not on file    Attends religious service: Not on file    Active member of club or organization: Not on file    Attends meetings of clubs or organizations: Not on file    Relationship status: Not on file  Other Topics Concern  . Not on file  Social History Narrative  . Not on file     Family History: The patient's family history includes Brain cancer in an other family member; Coronary artery disease in an other family member; Heart attack in an other family member; Prostate cancer in an other family member. ROS:   Please see the history of present illness.    All other systems reviewed and are negative.  EKGs/Labs/Other Studies Reviewed:    The following studies were reviewed today:  EKG:  EKG ordered today and personally reviewed.  The ekg ordered today demonstrates [place] 82 BPM  Recent Labs: No results found for requested labs within last 8760 hours.  Recent Lipid Panel No results found for: CHOL, TRIG, HDL, CHOLHDL, VLDL, LDLCALC, LDLDIRECT  Physical Exam:    VS:  BP (!) 128/50 (BP Location: Right Arm, Patient Position: Sitting, Cuff Size: Normal)   Pulse 67   Ht 5\' 7"  (1.702 m)   Wt 162 lb 4 oz (73.6 kg)   SpO2 98%   BMI 25.41 kg/m     Wt Readings from Last 3 Encounters:  04/29/19 162 lb 4 oz (73.6 kg)  03/24/19 163 lb 3.2 oz (74 kg)  11/26/18 162 lb (73.5 kg)     GEN:  Well nourished, well developed in no acute distress HEENT: Normal NECK: No JVD; No carotid bruits LYMPHATICS: No lymphadenopathy CARDIAC: 1-2/6 AS/AR RRR, no , rubs, gallops RESPIRATORY:  Clear to auscultation without rales, wheezing or rhonchi  ABDOMEN: Soft, non-tender, non-distended MUSCULOSKELETAL:  No  edema; No deformity  SKIN: Warm and dry NEUROLOGIC:  Alert and oriented x 3 PSYCHIATRIC:  Normal affect    Signed, Bruce More, MD  04/29/2019 9:56 AM    [place]

## 2019-04-29 NOTE — Patient Instructions (Signed)
Medication Instructions:  Your physician has recommended you make the following change in your medication:   DECREASE amlodipine (norvasc) 5 mg: Take 1 tablet every other day  If you need a refill on your cardiac medications before your next appointment, please call your pharmacy.   Lab work: None  If you have labs (blood work) drawn today and your tests are completely normal, you will receive your results only by: Marland Kitchen MyChart Message (if you have MyChart) OR . A paper copy in the mail If you have any lab test that is abnormal or we need to change your treatment, we will call you to review the results.  Testing/Procedures: You had an EKG today.   Follow-Up: At [facility], you and your health needs are our priority.  As part of our continuing mission to provide you with exceptional heart care, we have created designated Provider Care Teams.  These Care Teams include your primary Cardiologist (physician) and Advanced Practice Providers (APPs -  Physician Assistants and Nurse Practitioners) who all work together to provide you with the care you need, when you need it. You will need a follow up appointment in 6 months.  Please call our office 2 months in advance to schedule this appointment.

## 2019-05-06 ENCOUNTER — Ambulatory Visit: Payer: Medicare Other | Admitting: Cardiology

## 2019-05-07 DIAGNOSIS — E063 Autoimmune thyroiditis: Secondary | ICD-10-CM | POA: Diagnosis not present

## 2019-05-07 DIAGNOSIS — E785 Hyperlipidemia, unspecified: Secondary | ICD-10-CM | POA: Diagnosis not present

## 2019-05-07 DIAGNOSIS — N183 Chronic kidney disease, stage 3 (moderate): Secondary | ICD-10-CM | POA: Diagnosis not present

## 2019-05-07 DIAGNOSIS — E118 Type 2 diabetes mellitus with unspecified complications: Secondary | ICD-10-CM | POA: Diagnosis not present

## 2019-05-08 DIAGNOSIS — E785 Hyperlipidemia, unspecified: Secondary | ICD-10-CM | POA: Diagnosis not present

## 2019-05-08 DIAGNOSIS — E118 Type 2 diabetes mellitus with unspecified complications: Secondary | ICD-10-CM | POA: Diagnosis not present

## 2019-05-08 DIAGNOSIS — E063 Autoimmune thyroiditis: Secondary | ICD-10-CM | POA: Diagnosis not present

## 2019-05-19 DIAGNOSIS — Z23 Encounter for immunization: Secondary | ICD-10-CM | POA: Diagnosis not present

## 2019-06-18 ENCOUNTER — Other Ambulatory Visit: Payer: Self-pay | Admitting: Cardiology

## 2019-07-03 DIAGNOSIS — L814 Other melanin hyperpigmentation: Secondary | ICD-10-CM | POA: Diagnosis not present

## 2019-07-03 DIAGNOSIS — D225 Melanocytic nevi of trunk: Secondary | ICD-10-CM | POA: Diagnosis not present

## 2019-07-03 DIAGNOSIS — L821 Other seborrheic keratosis: Secondary | ICD-10-CM | POA: Diagnosis not present

## 2019-07-03 DIAGNOSIS — L578 Other skin changes due to chronic exposure to nonionizing radiation: Secondary | ICD-10-CM | POA: Diagnosis not present

## 2019-07-03 DIAGNOSIS — L570 Actinic keratosis: Secondary | ICD-10-CM | POA: Diagnosis not present

## 2019-08-17 DIAGNOSIS — C61 Malignant neoplasm of prostate: Secondary | ICD-10-CM | POA: Diagnosis not present

## 2019-09-02 DIAGNOSIS — E118 Type 2 diabetes mellitus with unspecified complications: Secondary | ICD-10-CM | POA: Diagnosis not present

## 2019-09-02 DIAGNOSIS — Z79899 Other long term (current) drug therapy: Secondary | ICD-10-CM | POA: Diagnosis not present

## 2019-09-02 DIAGNOSIS — E063 Autoimmune thyroiditis: Secondary | ICD-10-CM | POA: Diagnosis not present

## 2019-09-02 DIAGNOSIS — E785 Hyperlipidemia, unspecified: Secondary | ICD-10-CM | POA: Diagnosis not present

## 2019-09-02 DIAGNOSIS — D649 Anemia, unspecified: Secondary | ICD-10-CM | POA: Diagnosis not present

## 2019-09-02 DIAGNOSIS — E559 Vitamin D deficiency, unspecified: Secondary | ICD-10-CM | POA: Diagnosis not present

## 2019-09-09 DIAGNOSIS — R944 Abnormal results of kidney function studies: Secondary | ICD-10-CM | POA: Diagnosis not present

## 2019-10-19 ENCOUNTER — Other Ambulatory Visit: Payer: Self-pay | Admitting: Cardiology

## 2019-12-04 DIAGNOSIS — E118 Type 2 diabetes mellitus with unspecified complications: Secondary | ICD-10-CM | POA: Diagnosis not present

## 2019-12-04 DIAGNOSIS — E063 Autoimmune thyroiditis: Secondary | ICD-10-CM | POA: Diagnosis not present

## 2019-12-04 DIAGNOSIS — D649 Anemia, unspecified: Secondary | ICD-10-CM | POA: Diagnosis not present

## 2019-12-04 DIAGNOSIS — Z79899 Other long term (current) drug therapy: Secondary | ICD-10-CM | POA: Diagnosis not present

## 2019-12-04 DIAGNOSIS — E663 Overweight: Secondary | ICD-10-CM | POA: Diagnosis not present

## 2019-12-04 DIAGNOSIS — K2100 Gastro-esophageal reflux disease with esophagitis, without bleeding: Secondary | ICD-10-CM | POA: Diagnosis not present

## 2019-12-04 DIAGNOSIS — E785 Hyperlipidemia, unspecified: Secondary | ICD-10-CM | POA: Diagnosis not present

## 2019-12-08 NOTE — Progress Notes (Signed)
Cardiology Office Note:    Date:  12/09/2019   ID:  VAHN HERNANDEZGARCI, DOB 12-23-1940, MRN [id number]  PCP:  Ocie Doyne., MD (Inactive)  Cardiologist:  Shirlee More, MD    Referring MD: No ref. provider found    ASSESSMENT:    1. Nonrheumatic aortic valve stenosis   2. Aortic valve stenosis, etiology of cardiac valve disease unspecified   3. Essential hypertension   4. Other hyperlipidemia    PLAN:    In order of problems listed above:  1. His aortic valve disease is stable asymptomatic not severe plan to do an echocardiogram after his next visit I do not think he requires surgical intervention in the near or intermediate term. 2. Blood pressure at target edema has cleared with reduced dose of calcium channel blocker 3. Stable continue his low intensity statin and recheck liver function lipid profile   Next appointment: 6 months   Medication Adjustments/Labs and Tests Ordered: Current medicines are reviewed at length with the patient today.  Concerns regarding medicines are outlined above.  No orders of the defined types were placed in this encounter.  No orders of the defined types were placed in this encounter.   Chief Complaint  Patient presents with  . Follow-up    6 Months    History of Present Illness:    DACOTA AMELUNG is a 79 y.o. male with a hx of hypertension hyperlipidemia type 2 diabetes mellitus and aortic regurgitation.  Echocardiogram performed 03/20/2019 showed moderate aortic regurgitation and mild aortic stenosis.  On review of his previous echocardiogram from 2018 there has been no progression in his aortic valve disease.  He was last seen 04/29/2019. Compliance with diet, lifestyle and medications:  Yes  Is in good spirits pleased with the quality of his wife we reduced his calcium channel blocker and his edema is resolved.  Home blood pressures are almost always in the 1 [id number] systolic range and since we added oral mononitrates has had no angina.   He is vigorous active works in the garden without exercise intolerance shortness of breath chest pain edema palpitation or syncope.  We will recheck his labs including liver function renal function with diuretic lipid profile and a proBNP level.  Will likely need a repeat echocardiogram around the time of his next visit. Past Medical History:  Diagnosis Date  . Anemia    recent iron supplement  . Asthma    childhood-   2013 bronchitis w/wheezing  . Complication of anesthesia    " ether made me sick" - "woke up during colonoscopy"  . Diabetes mellitus without complication ([place])   . Frequency of urination   . GERD (gastroesophageal reflux disease)    occasional  . Heart murmur   . Hemorrhoids   . History of kidney stones   . Hx of colon cancer, stage I   . Hx of nonmelanoma skin cancer   . Hx of peptic ulcer    as teenager  . Hx of transfusion   . Hyperlipidemia   . Hypertension   . Inguinal hernia   . PONV (postoperative nausea and vomiting)    only when had Ether as Anesthesia drug  . Prostate cancer ([place]) 08/15/12   also Hx colon cancer / Hx skin cancer  . Right inguinal hernia 05/08/2016  . S/P right inguinal hernia repair Sept 2015 05/17/2014   Recurrent right indirect  Hernia treated with robotic TAPP repair with enterolysis Sept 2017  Past Surgical History:  Procedure Laterality Date  . cataract surgery  2009  . CYSTOSCOPY    . INGUINAL HERNIA REPAIR Right 05/17/2014   Procedure: RIGHT INGUINAL HERNIA REPAIR ;  Surgeon: Kaylyn Lim, MD;  Location: [facility];  Service: General;  Laterality: Right;  With [place] Right 2012  . PARTIAL COLECTOMY  2005  . 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 RADIOACTIVE SEED IMPLANT N/A 11/24/2012   Procedure: RADIOACTIVE SEED IMPLANT;  Surgeon: Bernestine Amass, MD;  Location[facility];  Service: Urology;  Laterality: N/A;   71seeds implanted  no seeds found in bladder  . THROAT SURGERY  1940's   as  child (age 24)growth that created fissure/corrective surgery  . TONSILLECTOMY     age 41  . XI ROBOTIC ASSISTED INGUINAL HERNIA REPAIR WITH MESH Right 05/08/2016   Procedure: XI ROBOTIC ASSISTED REPAIR OF RECURRENT RIGHT INGUINAL HERNIA;  Surgeon: Johnathan Hausen, MD;  Location: [facility];  Service: General;  Laterality: Right;  With MESH    Current Medications: Current Meds  Medication Sig  . albuterol (PROVENTIL HFA;VENTOLIN HFA) 108 (90 BASE) MCG/ACT inhaler Inhale 2 puffs into the lungs every 6 (six) hours as needed for wheezing.  Marland Kitchen amLODipine (NORVASC) 5 MG tablet Take 1 tablet (5 mg total) by mouth every other day.  . bisoprolol-hydrochlorothiazide (ZIAC) 10-6.25 MG per tablet Take 1 tablet by mouth every morning.   . cyanocobalamin (,VITAMIN B-12,) 1000 MCG/ML injection Inject 1 mL into the skin every 30 (thirty) days.   . fesoterodine (TOVIAZ) 4 MG TB24 tablet Take 4 mg by mouth daily.  . fexofenadine (ALLEGRA) 180 MG tablet Take 180 mg by mouth daily.  . fluticasone (FLONASE) 50 MCG/ACT nasal spray Place 1 spray into both nostrils daily.   . fluticasone-salmeterol (ADVAIR HFA) 115-21 MCG/ACT inhaler Inhale 2 puffs into the lungs 2 (two) times daily.  . isosorbide mononitrate (IMDUR) 30 MG 24 hr tablet Take 1 tablet (30 mg total) by mouth daily. PLEASE CALL OFFICE TO SCHEDULE APPOINTMENT.  Marland Kitchen levothyroxine (SYNTHROID) 50 MCG tablet Take 50 mcg by mouth daily.  . Loperamide HCl (IMODIUM A-D PO) Take 1 capsule by mouth as needed.  Marland Kitchen losartan (COZAAR) 100 MG tablet Take 100 mg by mouth every other day.   . lovastatin (MEVACOR) 10 MG tablet TAKE ONE TABLET BY MOUTH ON MONDAY, WEDNESDAY, AND FRIDAY ONLY  . metFORMIN (GLUCOPHAGE-XR) 500 MG 24 hr tablet Take 500 mg by mouth 2 (two) times daily.  . nitroGLYCERIN (NITROSTAT) 0.4 MG SL tablet Place 1 tablet (0.4 mg total) under the tongue every 5 (five) minutes as needed for chest pain.  Marland Kitchen omeprazole (PRILOSEC) 20 MG capsule Take 20 mg by mouth  daily.  . Probiotic Product (PROBIOTIC DAILY PO) Take 1 capsule by mouth daily.  . Tamsulosin HCl (FLOMAX) 0.4 MG CAPS Take 0.4 mg by mouth 2 (two) times daily.   . Vitamin D, Ergocalciferol, (DRISDOL) 1.25 MG (50000 UT) CAPS capsule Take 50,000 Units by mouth once a week.     Allergies:   Penicillins, Aspirin, Monodox [doxycycline hyclate], Sulfa antibiotics, and Tramadol   Social History   Socioeconomic History  . Marital status: Married    Spouse name: Not on file  . Number of children: 2  . Years of education: Not on file  . Highest education level: Not on file  Occupational History  . Occupation: Retired    Comment: Charity fundraiser  Tobacco Use  .  Smoking status: Never Smoker  . Smokeless tobacco: Never Used  Substance and Sexual Activity  . Alcohol use: No  . Drug use: No  . Sexual activity: Not on file  Other Topics Concern  . Not on file  Social History Narrative  . Not on file   Social Determinants of Health   Financial Resource Strain:   . Difficulty of Paying Living Expenses:   Food Insecurity:   . Worried About Charity fundraiser in the Last Year:   . Arboriculturist in the Last Year:   Transportation Needs:   . Film/video editor (Medical):   Marland Kitchen Lack of Transportation (Non-Medical):   Physical Activity:   . Days of Exercise per Week:   . Minutes of Exercise per Session:   Stress:   . Feeling of Stress :   Social Connections:   . Frequency of Communication with Friends and Family:   . Frequency of Social Gatherings with Friends and Family:   . Attends Religious Services:   . Active Member of Clubs or Organizations:   . Attends Archivist Meetings:   Marland Kitchen Marital Status:      Family History: The patient's family history includes Brain cancer in an other family member; Coronary artery disease in an other family member; Heart attack in an other family member; Prostate cancer in an other family member. ROS:   Please see the history of present  illness.    All other systems reviewed and are negative.  EKGs/Labs/Other Studies Reviewed:    The following studies were reviewed today:   Recent Labs: No results found for requested labs within last 8760 hours.  Recent Lipid Panel No results found for: CHOL, TRIG, HDL, CHOLHDL, VLDL, LDLCALC, LDLDIRECT  Physical Exam:    VS:  BP 134/62   Pulse 72   Ht 5\' 7"  (1.702 m)   Wt 164 lb (74.4 kg)   SpO2 99%   BMI 25.69 kg/m     Wt Readings from Last 3 Encounters:  12/09/19 164 lb (74.4 kg)  04/29/19 162 lb 4 oz (73.6 kg)  03/24/19 163 lb 3.2 oz (74 kg)     GEN: Appears his age well nourished, well developed in no acute distress HEENT: Normal NECK: No JVD; No carotid bruits LYMPHATICS: No lymphadenopathy CARDIAC: 1/6 systolic ejection murmur aortic area I cannot auscultate aortic regurgitation RRR, no murmurs, rubs, gallops RESPIRATORY:  Clear to auscultation without rales, wheezing or rhonchi  ABDOMEN: Soft, non-tender, non-distended MUSCULOSKELETAL:  No edema; No deformity  SKIN: Warm and dry NEUROLOGIC:  Alert and oriented x 3 PSYCHIATRIC:  Normal affect    Signed, Shirlee More, MD  12/09/2019 9:36 AM    [place]

## 2019-12-09 ENCOUNTER — Other Ambulatory Visit: Payer: Self-pay

## 2019-12-09 ENCOUNTER — Encounter: Payer: Self-pay | Admitting: Cardiology

## 2019-12-09 ENCOUNTER — Ambulatory Visit (INDEPENDENT_AMBULATORY_CARE_PROVIDER_SITE_OTHER): Payer: Medicare Other | Admitting: Cardiology

## 2019-12-09 VITALS — BP 134/62 | HR 72 | Ht 67.0 in | Wt 164.0 lb

## 2019-12-09 DIAGNOSIS — I350 Nonrheumatic aortic (valve) stenosis: Secondary | ICD-10-CM | POA: Diagnosis not present

## 2019-12-09 DIAGNOSIS — R0602 Shortness of breath: Secondary | ICD-10-CM | POA: Diagnosis not present

## 2019-12-09 DIAGNOSIS — E7849 Other hyperlipidemia: Secondary | ICD-10-CM

## 2019-12-09 DIAGNOSIS — I10 Essential (primary) hypertension: Secondary | ICD-10-CM

## 2019-12-09 NOTE — Patient Instructions (Signed)
Medication Instructions:  Your physician recommends that you continue on your current medications as directed. Please refer to the Current Medication list given to you today.  *If you need a refill on your cardiac medications before your next appointment, please call your pharmacy*   Lab Work: Your physician recommends that you return for lab work in: [place], ProBNP, Lipids If you have labs (blood work) drawn today and your tests are completely normal, you will receive your results only by: Marland Kitchen MyChart Message (if you have MyChart) OR . A paper copy in the mail If you have any lab test that is abnormal or we need to change your treatment, we will call you to review the results.   Testing/Procedures: Your physician has requested that you have an echocardiogram. Echocardiography is a painless test that uses sound waves to create images of your heart. It provides your doctor with information about the size and shape of your heart and how well your heart's chambers and valves are working. This procedure takes approximately one hour. There are no restrictions for this procedure.     Follow-Up: At [facility], you and your health needs are our priority.  As part of our continuing mission to provide you with exceptional heart care, we have created designated Provider Care Teams.  These Care Teams include your primary Cardiologist (physician) and Advanced Practice Providers (APPs -  Physician Assistants and Nurse Practitioners) who all work together to provide you with the care you need, when you need it.  We recommend signing up for the patient portal called "MyChart".  Sign up information is provided on this After Visit Summary.  MyChart is used to connect with patients for Virtual Visits (Telemedicine).  Patients are able to view lab/test results, encounter notes, upcoming appointments, etc.  Non-urgent messages can be sent to your provider as well.   To learn more about what you can do with  MyChart, go to [email or web address].    Your next appointment:   6 month(s)  The format for your next appointment:   In Person  Provider:   Shirlee More, MD   Other Instructions

## 2019-12-10 LAB — COMPREHENSIVE METABOLIC PANEL
ALT: 25 IU/L (ref 0–44)
AST: 25 IU/L (ref 0–40)
Albumin/Globulin Ratio: 2.3 — ABNORMAL HIGH (ref 1.2–2.2)
Albumin: 4.3 g/dL (ref 3.7–4.7)
Alkaline Phosphatase: 65 IU/L (ref 39–117)
BUN/Creatinine Ratio: 18 (ref 10–24)
BUN: 19 mg/dL (ref 8–27)
Bilirubin Total: 0.9 mg/dL (ref 0.0–1.2)
CO2: 22 mmol/L (ref 20–29)
Calcium: 9.4 mg/dL (ref 8.6–10.2)
Chloride: 100 mmol/L (ref 96–106)
Creatinine, Ser: 1.03 mg/dL (ref 0.76–1.27)
GFR calc Af Amer: 79 mL/min/{1.73_m2} (ref >59)
GFR calc non Af Amer: 69 mL/min/{1.73_m2} (ref >59)
Globulin, Total: 1.9 g/dL (ref 1.5–4.5)
Glucose: 191 mg/dL — ABNORMAL HIGH (ref 65–99)
Potassium: 4.4 mmol/L (ref 3.5–5.2)
Sodium: 137 mmol/L (ref 134–144)
Total Protein: 6.2 g/dL (ref 6.0–8.5)

## 2019-12-10 LAB — LIPID PANEL
Chol/HDL Ratio: 4.7 ratio (ref 0.0–5.0)
Cholesterol, Total: 150 mg/dL (ref 100–199)
HDL: 32 mg/dL — ABNORMAL LOW (ref >39)
LDL Chol Calc (NIH): 90 mg/dL (ref 0–99)
Triglycerides: 159 mg/dL — ABNORMAL HIGH (ref 0–149)
VLDL Cholesterol Cal: 28 mg/dL (ref 5–40)

## 2019-12-10 LAB — PRO B NATRIURETIC PEPTIDE: NT-Pro BNP: 101 pg/mL (ref 0–486)

## 2019-12-14 DIAGNOSIS — B079 Viral wart, unspecified: Secondary | ICD-10-CM | POA: Diagnosis not present

## 2019-12-14 DIAGNOSIS — B353 Tinea pedis: Secondary | ICD-10-CM | POA: Diagnosis not present

## 2019-12-14 DIAGNOSIS — L578 Other skin changes due to chronic exposure to nonionizing radiation: Secondary | ICD-10-CM | POA: Diagnosis not present

## 2019-12-14 DIAGNOSIS — L570 Actinic keratosis: Secondary | ICD-10-CM | POA: Diagnosis not present

## 2020-01-18 ENCOUNTER — Other Ambulatory Visit: Payer: Self-pay | Admitting: Cardiology

## 2020-03-08 DIAGNOSIS — Z9181 History of falling: Secondary | ICD-10-CM | POA: Diagnosis not present

## 2020-03-08 DIAGNOSIS — J301 Allergic rhinitis due to pollen: Secondary | ICD-10-CM | POA: Diagnosis not present

## 2020-03-08 DIAGNOSIS — I10 Essential (primary) hypertension: Secondary | ICD-10-CM | POA: Diagnosis not present

## 2020-03-08 DIAGNOSIS — Z1331 Encounter for screening for depression: Secondary | ICD-10-CM | POA: Diagnosis not present

## 2020-03-08 DIAGNOSIS — E063 Autoimmune thyroiditis: Secondary | ICD-10-CM | POA: Diagnosis not present

## 2020-03-08 DIAGNOSIS — J45909 Unspecified asthma, uncomplicated: Secondary | ICD-10-CM | POA: Diagnosis not present

## 2020-03-08 DIAGNOSIS — I209 Angina pectoris, unspecified: Secondary | ICD-10-CM | POA: Diagnosis not present

## 2020-03-08 DIAGNOSIS — E118 Type 2 diabetes mellitus with unspecified complications: Secondary | ICD-10-CM | POA: Diagnosis not present

## 2020-03-08 DIAGNOSIS — E538 Deficiency of other specified B group vitamins: Secondary | ICD-10-CM | POA: Diagnosis not present

## 2020-03-08 DIAGNOSIS — Z6825 Body mass index (BMI) 25.0-25.9, adult: Secondary | ICD-10-CM | POA: Diagnosis not present

## 2020-03-08 DIAGNOSIS — K2100 Gastro-esophageal reflux disease with esophagitis, without bleeding: Secondary | ICD-10-CM | POA: Diagnosis not present

## 2020-03-08 DIAGNOSIS — E559 Vitamin D deficiency, unspecified: Secondary | ICD-10-CM | POA: Diagnosis not present

## 2020-03-28 DIAGNOSIS — R198 Other specified symptoms and signs involving the digestive system and abdomen: Secondary | ICD-10-CM | POA: Diagnosis not present

## 2020-03-28 DIAGNOSIS — K591 Functional diarrhea: Secondary | ICD-10-CM | POA: Diagnosis not present

## 2020-03-28 DIAGNOSIS — Z86010 Personal history of colonic polyps: Secondary | ICD-10-CM | POA: Diagnosis not present

## 2020-04-07 DIAGNOSIS — L570 Actinic keratosis: Secondary | ICD-10-CM | POA: Diagnosis not present

## 2020-04-07 DIAGNOSIS — L578 Other skin changes due to chronic exposure to nonionizing radiation: Secondary | ICD-10-CM | POA: Diagnosis not present

## 2020-04-07 DIAGNOSIS — L820 Inflamed seborrheic keratosis: Secondary | ICD-10-CM | POA: Diagnosis not present

## 2020-04-07 DIAGNOSIS — L821 Other seborrheic keratosis: Secondary | ICD-10-CM | POA: Diagnosis not present

## 2020-04-25 DIAGNOSIS — Z1159 Encounter for screening for other viral diseases: Secondary | ICD-10-CM | POA: Diagnosis not present

## 2020-04-28 DIAGNOSIS — D123 Benign neoplasm of transverse colon: Secondary | ICD-10-CM | POA: Diagnosis not present

## 2020-04-28 DIAGNOSIS — Z86010 Personal history of colonic polyps: Secondary | ICD-10-CM | POA: Diagnosis not present

## 2020-04-28 DIAGNOSIS — D122 Benign neoplasm of ascending colon: Secondary | ICD-10-CM | POA: Diagnosis not present

## 2020-04-28 DIAGNOSIS — Z980 Intestinal bypass and anastomosis status: Secondary | ICD-10-CM | POA: Diagnosis not present

## 2020-04-28 DIAGNOSIS — D124 Benign neoplasm of descending colon: Secondary | ICD-10-CM | POA: Diagnosis not present

## 2020-05-03 DIAGNOSIS — D124 Benign neoplasm of descending colon: Secondary | ICD-10-CM | POA: Diagnosis not present

## 2020-05-03 DIAGNOSIS — D123 Benign neoplasm of transverse colon: Secondary | ICD-10-CM | POA: Diagnosis not present

## 2020-05-03 DIAGNOSIS — D122 Benign neoplasm of ascending colon: Secondary | ICD-10-CM | POA: Diagnosis not present

## 2020-05-19 DIAGNOSIS — Z23 Encounter for immunization: Secondary | ICD-10-CM | POA: Diagnosis not present

## 2020-05-30 ENCOUNTER — Ambulatory Visit (HOSPITAL_BASED_OUTPATIENT_CLINIC_OR_DEPARTMENT_OTHER)
Admission: RE | Admit: 2020-05-30 | Discharge: 2020-05-30 | Disposition: A | Discharge status: Home / Self Care | Payer: Medicare Other | Source: Ambulatory Visit | Attending: Cardiology | Admitting: Cardiology

## 2020-05-30 ENCOUNTER — Other Ambulatory Visit: Payer: Self-pay

## 2020-05-30 DIAGNOSIS — I350 Nonrheumatic aortic (valve) stenosis: Secondary | ICD-10-CM | POA: Diagnosis not present

## 2020-05-30 LAB — ECHOCARDIOGRAM COMPLETE
AR max vel: 1.08 cm2
AV Area VTI: 1.11 cm2
AV Area mean vel: 1.02 cm2
AV Mean grad: 11.3 mmHg
AV Peak grad: 19.7 mmHg
Ao pk vel: 2.22 m/s
Area-P 1/2: 4.31 cm2
P 1/2 time: 344 msec
S' Lateral: 2.39 cm

## 2020-05-31 ENCOUNTER — Telehealth: Payer: Self-pay

## 2020-05-31 NOTE — Telephone Encounter (Signed)
-----   Message from Richardo Priest, MD sent at 05/30/2020  5:38 PM EDT ----- Good result the aortic stenosis has not progressed.

## 2020-05-31 NOTE — Telephone Encounter (Signed)
Spoke with patient regarding results and recommendation.  Patient verbalizes understanding and is agreeable to plan of care. Advised patient to call back with any issues or concerns.  

## 2020-06-08 DIAGNOSIS — E118 Type 2 diabetes mellitus with unspecified complications: Secondary | ICD-10-CM | POA: Diagnosis not present

## 2020-06-08 DIAGNOSIS — K2100 Gastro-esophageal reflux disease with esophagitis, without bleeding: Secondary | ICD-10-CM | POA: Diagnosis not present

## 2020-06-08 DIAGNOSIS — N138 Other obstructive and reflux uropathy: Secondary | ICD-10-CM | POA: Diagnosis not present

## 2020-06-08 DIAGNOSIS — J301 Allergic rhinitis due to pollen: Secondary | ICD-10-CM | POA: Diagnosis not present

## 2020-06-08 DIAGNOSIS — D649 Anemia, unspecified: Secondary | ICD-10-CM | POA: Diagnosis not present

## 2020-06-08 DIAGNOSIS — E063 Autoimmune thyroiditis: Secondary | ICD-10-CM | POA: Diagnosis not present

## 2020-06-08 DIAGNOSIS — I10 Essential (primary) hypertension: Secondary | ICD-10-CM | POA: Diagnosis not present

## 2020-06-08 DIAGNOSIS — E538 Deficiency of other specified B group vitamins: Secondary | ICD-10-CM | POA: Diagnosis not present

## 2020-06-08 DIAGNOSIS — E785 Hyperlipidemia, unspecified: Secondary | ICD-10-CM | POA: Diagnosis not present

## 2020-06-08 DIAGNOSIS — E559 Vitamin D deficiency, unspecified: Secondary | ICD-10-CM | POA: Diagnosis not present

## 2020-06-08 DIAGNOSIS — Z6825 Body mass index (BMI) 25.0-25.9, adult: Secondary | ICD-10-CM | POA: Diagnosis not present

## 2020-06-08 DIAGNOSIS — N401 Enlarged prostate with lower urinary tract symptoms: Secondary | ICD-10-CM | POA: Diagnosis not present

## 2020-07-25 DIAGNOSIS — Z8546 Personal history of malignant neoplasm of prostate: Secondary | ICD-10-CM | POA: Diagnosis not present

## 2020-08-01 DIAGNOSIS — Z8546 Personal history of malignant neoplasm of prostate: Secondary | ICD-10-CM | POA: Diagnosis not present

## 2020-08-01 DIAGNOSIS — N401 Enlarged prostate with lower urinary tract symptoms: Secondary | ICD-10-CM | POA: Diagnosis not present

## 2020-08-01 DIAGNOSIS — N3281 Overactive bladder: Secondary | ICD-10-CM | POA: Diagnosis not present

## 2020-09-29 DIAGNOSIS — I10 Essential (primary) hypertension: Secondary | ICD-10-CM | POA: Diagnosis not present

## 2020-09-29 DIAGNOSIS — Z6825 Body mass index (BMI) 25.0-25.9, adult: Secondary | ICD-10-CM | POA: Diagnosis not present

## 2020-09-29 DIAGNOSIS — E559 Vitamin D deficiency, unspecified: Secondary | ICD-10-CM | POA: Diagnosis not present

## 2020-09-29 DIAGNOSIS — I209 Angina pectoris, unspecified: Secondary | ICD-10-CM | POA: Diagnosis not present

## 2020-09-29 DIAGNOSIS — E785 Hyperlipidemia, unspecified: Secondary | ICD-10-CM | POA: Diagnosis not present

## 2020-09-29 DIAGNOSIS — J45909 Unspecified asthma, uncomplicated: Secondary | ICD-10-CM | POA: Diagnosis not present

## 2020-09-29 DIAGNOSIS — K2100 Gastro-esophageal reflux disease with esophagitis, without bleeding: Secondary | ICD-10-CM | POA: Diagnosis not present

## 2020-09-29 DIAGNOSIS — E118 Type 2 diabetes mellitus with unspecified complications: Secondary | ICD-10-CM | POA: Diagnosis not present

## 2020-09-29 DIAGNOSIS — D649 Anemia, unspecified: Secondary | ICD-10-CM | POA: Diagnosis not present

## 2020-09-29 DIAGNOSIS — E063 Autoimmune thyroiditis: Secondary | ICD-10-CM | POA: Diagnosis not present

## 2020-09-29 DIAGNOSIS — J301 Allergic rhinitis due to pollen: Secondary | ICD-10-CM | POA: Diagnosis not present

## 2020-09-29 DIAGNOSIS — E538 Deficiency of other specified B group vitamins: Secondary | ICD-10-CM | POA: Diagnosis not present

## 2020-10-10 DIAGNOSIS — L578 Other skin changes due to chronic exposure to nonionizing radiation: Secondary | ICD-10-CM | POA: Diagnosis not present

## 2020-10-10 DIAGNOSIS — L570 Actinic keratosis: Secondary | ICD-10-CM | POA: Diagnosis not present

## 2020-10-10 DIAGNOSIS — L821 Other seborrheic keratosis: Secondary | ICD-10-CM | POA: Diagnosis not present

## 2020-10-24 ENCOUNTER — Other Ambulatory Visit: Payer: Self-pay | Admitting: Cardiology

## 2020-11-01 ENCOUNTER — Other Ambulatory Visit: Payer: Self-pay | Admitting: Cardiology

## 2020-11-26 ENCOUNTER — Other Ambulatory Visit: Payer: Self-pay | Admitting: Cardiology

## 2020-11-28 NOTE — Telephone Encounter (Signed)
Imdur approved and sent. Pharmacy notified pt needs to schedule an appt for further refills. 2nd attempt

## 2020-12-02 ENCOUNTER — Telehealth: Payer: Self-pay | Admitting: Cardiology

## 2020-12-02 MED ORDER — AMLODIPINE BESYLATE 5 MG PO TABS
5.0000 mg | ORAL_TABLET | ORAL | 3 refills | Status: DC
Start: 2020-12-02 — End: 2023-04-12

## 2020-12-02 NOTE — Telephone Encounter (Signed)
*  STAT* If patient is at the pharmacy, call can be transferred to refill team.   1. Which medications need to be refilled? (please list name of each medication and dose if known) amLODipine (NORVASC) 5 MG tablet  2. Which pharmacy/location (including street and city if local pharmacy) is medication to be sent to? [place], [place]  3. Do they need a 30 day or 90 day supply? [place]

## 2020-12-02 NOTE — Telephone Encounter (Signed)
Refill sent in per request.  

## 2020-12-14 ENCOUNTER — Other Ambulatory Visit: Payer: Self-pay | Admitting: Cardiology

## 2020-12-29 ENCOUNTER — Other Ambulatory Visit: Payer: Self-pay | Admitting: Cardiology

## 2020-12-30 DIAGNOSIS — R011 Cardiac murmur, unspecified: Secondary | ICD-10-CM | POA: Insufficient documentation

## 2020-12-30 DIAGNOSIS — Z85038 Personal history of other malignant neoplasm of large intestine: Secondary | ICD-10-CM | POA: Insufficient documentation

## 2020-12-30 DIAGNOSIS — K4090 Unilateral inguinal hernia, without obstruction or gangrene, not specified as recurrent: Secondary | ICD-10-CM | POA: Insufficient documentation

## 2020-12-30 DIAGNOSIS — R350 Frequency of micturition: Secondary | ICD-10-CM | POA: Insufficient documentation

## 2020-12-30 DIAGNOSIS — Z85828 Personal history of other malignant neoplasm of skin: Secondary | ICD-10-CM | POA: Insufficient documentation

## 2020-12-30 DIAGNOSIS — I10 Essential (primary) hypertension: Secondary | ICD-10-CM | POA: Insufficient documentation

## 2020-12-30 DIAGNOSIS — J45909 Unspecified asthma, uncomplicated: Secondary | ICD-10-CM | POA: Insufficient documentation

## 2020-12-30 DIAGNOSIS — K649 Unspecified hemorrhoids: Secondary | ICD-10-CM | POA: Insufficient documentation

## 2020-12-30 DIAGNOSIS — T8859XA Other complications of anesthesia, initial encounter: Secondary | ICD-10-CM | POA: Insufficient documentation

## 2020-12-30 DIAGNOSIS — Z8711 Personal history of peptic ulcer disease: Secondary | ICD-10-CM | POA: Insufficient documentation

## 2020-12-30 DIAGNOSIS — E119 Type 2 diabetes mellitus without complications: Secondary | ICD-10-CM | POA: Insufficient documentation

## 2020-12-30 DIAGNOSIS — K219 Gastro-esophageal reflux disease without esophagitis: Secondary | ICD-10-CM | POA: Insufficient documentation

## 2020-12-30 DIAGNOSIS — Z9289 Personal history of other medical treatment: Secondary | ICD-10-CM | POA: Insufficient documentation

## 2020-12-30 DIAGNOSIS — E785 Hyperlipidemia, unspecified: Secondary | ICD-10-CM | POA: Insufficient documentation

## 2020-12-30 DIAGNOSIS — Z9889 Other specified postprocedural states: Secondary | ICD-10-CM | POA: Insufficient documentation

## 2020-12-30 DIAGNOSIS — Z87442 Personal history of urinary calculi: Secondary | ICD-10-CM | POA: Insufficient documentation

## 2020-12-30 DIAGNOSIS — D649 Anemia, unspecified: Secondary | ICD-10-CM | POA: Insufficient documentation

## 2020-12-30 DIAGNOSIS — R112 Nausea with vomiting, unspecified: Secondary | ICD-10-CM | POA: Insufficient documentation

## 2021-01-04 DIAGNOSIS — Z6824 Body mass index (BMI) 24.0-24.9, adult: Secondary | ICD-10-CM | POA: Diagnosis not present

## 2021-01-04 DIAGNOSIS — J301 Allergic rhinitis due to pollen: Secondary | ICD-10-CM | POA: Diagnosis not present

## 2021-01-04 DIAGNOSIS — I209 Angina pectoris, unspecified: Secondary | ICD-10-CM | POA: Diagnosis not present

## 2021-01-04 DIAGNOSIS — I10 Essential (primary) hypertension: Secondary | ICD-10-CM | POA: Diagnosis not present

## 2021-01-04 DIAGNOSIS — K2100 Gastro-esophageal reflux disease with esophagitis, without bleeding: Secondary | ICD-10-CM | POA: Diagnosis not present

## 2021-01-04 DIAGNOSIS — E118 Type 2 diabetes mellitus with unspecified complications: Secondary | ICD-10-CM | POA: Diagnosis not present

## 2021-01-04 DIAGNOSIS — N401 Enlarged prostate with lower urinary tract symptoms: Secondary | ICD-10-CM | POA: Diagnosis not present

## 2021-01-04 DIAGNOSIS — E063 Autoimmune thyroiditis: Secondary | ICD-10-CM | POA: Diagnosis not present

## 2021-01-04 DIAGNOSIS — E538 Deficiency of other specified B group vitamins: Secondary | ICD-10-CM | POA: Diagnosis not present

## 2021-01-04 DIAGNOSIS — E559 Vitamin D deficiency, unspecified: Secondary | ICD-10-CM | POA: Diagnosis not present

## 2021-01-04 DIAGNOSIS — E785 Hyperlipidemia, unspecified: Secondary | ICD-10-CM | POA: Diagnosis not present

## 2021-01-04 DIAGNOSIS — D649 Anemia, unspecified: Secondary | ICD-10-CM | POA: Diagnosis not present

## 2021-01-23 NOTE — Progress Notes (Signed)
Cardiology Office Note:    Date:  01/24/2021   ID:  Bruce Nunez, DOB 07-06-1941, MRN [id number]  PCP:  Bruce Nunez., MD  Cardiologist:  Bruce More, MD    Referring MD: Bruce Nunez., MD    ASSESSMENT:    1. Nonrheumatic aortic valve insufficiency   2. Nonrheumatic aortic valve stenosis   3. Essential hypertension   4. Other hyperlipidemia    PLAN:    In order of problems listed above:  1. I am unsure of the severity of his aortic regurgitation I independently reviewed his echocardiogram I did not think it was severe however his pulse pressure approaches 100 mmHg the best to we have in these cases his cardiac MR for volume measurement 2. BP remains elevated switch to a Nunez potent ARB 3. Continue with statin lipids at target   Next appointment: 6 months   Medication Adjustments/Labs and Tests Ordered: Current medicines are reviewed at length with the patient today.  Concerns regarding medicines are outlined above.  No orders of the defined types were placed in this encounter.  No orders of the defined types were placed in this encounter.   No chief complaint on file.   History of Present Illness:    Bruce Nunez is a 80 y.o. male with a hx of hypertension hyperlipidemia type 2 diabetes and aortic valve disease with moderate aortic regurgitation mild aortic stenosis last seen 12/09/2019.  Compliance with diet, lifestyle and medications: Yes  Home blood pressure typically is greater than 784 systolic. Repeat by me today is 144/50. He has fatigue but does not have edema shortness of breath orthopnea chest pain palpitation or syncope. Am concerned about the severity of his aortic regurgitation we will do cardiac MR to define evidence for moderate or severe  Echocardiogram performed 05/30/2020 showed ejection fraction of 55 to 60% with moderate left ventricular hypertrophy mild mitral regurgitation moderate to severe aortic regurgitation pressure half-time 344 ms mild   aortic stenosis peak and mean gradients 20 and 11 mmHg with an aortic valve area by VTI 1.11cm.  I independently reviewed the echocardiogram there is no flow reversal in the thoracic aorta. Past Medical History:  Diagnosis Date  . Anemia    recent iron supplement  . Asthma    childhood-   2013 bronchitis w/wheezing  . Complication of anesthesia    " ether made me sick" - "woke up during colonoscopy"  . Diabetes mellitus without complication ([place])   . Frequency of urination   . GERD (gastroesophageal reflux disease)    occasional  . Heart murmur   . Hemorrhoids   . History of kidney stones   . Hx of colon cancer, stage I   . Hx of nonmelanoma skin cancer   . Hx of peptic ulcer    as teenager  . Hx of transfusion   . Hyperlipidemia   . Hypertension   . Inguinal hernia   . PONV (postoperative nausea and vomiting)    only when had Ether as Anesthesia drug  . Prostate cancer ([place]) 08/15/12   also Hx colon cancer / Hx skin cancer  . Right inguinal hernia 05/08/2016  . S/P right inguinal hernia repair Sept 2015 05/17/2014   Recurrent right indirect  Hernia treated with robotic TAPP repair with enterolysis Sept 2017    Past Surgical History:  Procedure Laterality Date  . cataract surgery  2009  . CYSTOSCOPY    . INGUINAL HERNIA REPAIR Right 05/17/2014  Procedure: RIGHT INGUINAL HERNIA REPAIR ;  Surgeon: Kaylyn Lim, MD;  Location: [facility];  Service: General;  Laterality: Right;  With [place] Right 2012  . PARTIAL COLECTOMY  2005  . 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 RADIOACTIVE SEED IMPLANT N/A 11/24/2012   Procedure: RADIOACTIVE SEED IMPLANT;  Surgeon: Bernestine Amass, MD;  [name];  Service: Urology;  Laterality: N/A;   71seeds implanted  no seeds found in bladder  . THROAT SURGERY  1940's   as child (age 69)growth that created fissure/corrective surgery  . TONSILLECTOMY     age 21  . XI ROBOTIC ASSISTED INGUINAL HERNIA  REPAIR WITH MESH Right 05/08/2016   Procedure: XI ROBOTIC ASSISTED REPAIR OF RECURRENT RIGHT INGUINAL HERNIA;  Surgeon: Johnathan Hausen, MD;  Location: [facility];  Service: General;  Laterality: Right;  With MESH    Current Medications: Current Meds  Medication Sig  . albuterol (PROVENTIL HFA;VENTOLIN HFA) 108 (90 BASE) MCG/ACT inhaler Inhale 2 puffs into the lungs every 6 (six) hours as needed for wheezing.  Marland Kitchen amLODipine (NORVASC) 5 MG tablet Take 1 tablet (5 mg total) by mouth every other day.  . bisoprolol-hydrochlorothiazide (ZIAC) 10-6.25 MG per tablet Take 1 tablet by mouth every morning.   . cyanocobalamin (,VITAMIN B-12,) 1000 MCG/ML injection Inject 1 mL into the skin every 30 (thirty) days.   . fexofenadine (ALLEGRA) 180 MG tablet Take 180 mg by mouth daily.  . fluticasone (FLONASE) 50 MCG/ACT nasal spray Place 1 spray into both nostrils daily.   . fluticasone-salmeterol (ADVAIR HFA) 115-21 MCG/ACT inhaler Inhale 2 puffs into the lungs 2 (two) times daily.  . isosorbide mononitrate (IMDUR) 30 MG 24 hr tablet Take 1 tablet (30 mg total) by mouth daily. Please keep appointment on 01-24-21 for further refills. 3 rd/final attempt  . levothyroxine (SYNTHROID) 50 MCG tablet Take 50 mcg by mouth daily.  . Loperamide HCl (IMODIUM A-D PO) Take 1 capsule by mouth as needed.  Marland Kitchen losartan (COZAAR) 100 MG tablet Take 100 mg by mouth every other day.   . lovastatin (MEVACOR) 10 MG tablet TAKE ONE TABLET BY MOUTH ON MONDAY, WEDNESDAY, AND FRIDAY ONLY  . metFORMIN (GLUCOPHAGE-XR) 500 MG 24 hr tablet Take 500 mg by mouth 2 (two) times daily.  . nitroGLYCERIN (NITROSTAT) 0.4 MG SL tablet Place 1 tablet (0.4 mg total) under the tongue every 5 (five) minutes as needed for chest pain.  Marland Kitchen omeprazole (PRILOSEC) 20 MG capsule Take 20 mg by mouth daily.  . Probiotic Product (PROBIOTIC DAILY PO) Take 1 capsule by mouth daily.  . solifenacin (VESICARE) 10 MG tablet Take 10 mg by mouth daily.  . Tamsulosin HCl  (FLOMAX) 0.4 MG CAPS Take 0.4 mg by mouth 2 (two) times daily.   . Vitamin D, Ergocalciferol, (DRISDOL) 1.25 MG (50000 UT) CAPS capsule Take 50,000 Units by mouth once a week.     Allergies:   Penicillins, Aspirin, Monodox [doxycycline hyclate], Sulfa antibiotics, and Tramadol   Social History   Socioeconomic History  . Marital status: Married    Spouse name: Not on file  . Number of children: 2  . Years of education: Not on file  . Highest education level: Not on file  Occupational History  . Occupation: Retired    Comment: Charity fundraiser  Tobacco Use  . Smoking status: Never Smoker  . Smokeless tobacco: Never Used  Vaping Use  . Vaping Use: Never used  Substance and  Sexual Activity  . Alcohol use: No  . Drug use: No  . Sexual activity: Not on file  Other Topics Concern  . Not on file  Social History Narrative  . Not on file   Social Determinants of Health   Financial Resource Strain: Not on file  Food Insecurity: Not on file  Transportation Needs: Not on file  Physical Activity: Not on file  Stress: Not on file  Social Connections: Not on file     Family History: The patient's family history includes Brain cancer in an other family member; Coronary artery disease in an other family member; Heart attack in an other family member; Prostate cancer in an other family member. ROS:   Please see the history of present illness.    All other systems reviewed and are negative.  EKGs/Labs/Other Studies Reviewed:    The following studies were reviewed today:  EKG:  EKG ordered today and personally reviewed.  The ekg ordered today demonstrates sinus rhythm and is normal  Recent Labs: 01/04/2021: Cholesterol 151 LDL 92 triglycerides 117 HDL 38 A1c 6.5% creatinine 1.01 Recent Lipid Panel    Component Value Date/Time   CHOL 150 12/09/2019 0957   TRIG 159 (H) 12/09/2019 0957   HDL 32 (L) 12/09/2019 0957   CHOLHDL 4.7 12/09/2019 0957   LDLCALC 90 12/09/2019 0957     Physical Exam:    VS:  Ht 5\' 7"  (1.702 m)   Wt 156 lb 9.6 oz (71 kg)   BMI 24.53 kg/m     Wt Readings from Last 3 Encounters:  01/24/21 156 lb 9.6 oz (71 kg)  12/09/19 164 lb (74.4 kg)  04/29/19 162 lb 4 oz (73.6 kg)     GEN: Appears his age well nourished, well developed in no acute distress HEENT: Normal NECK: No JVD; No carotid bruits LYMPHATICS: No lymphadenopathy CARDIAC: 2/6 aortic regurgitation left sternal border he also has a midsystolic murmur aortic area does not encompass S2 does not radiate to the carotids RRR, norubs, gallops RESPIRATORY:  Clear to auscultation without rales, wheezing or rhonchi  ABDOMEN: Soft, non-tender, non-distended MUSCULOSKELETAL:  No edema; No deformity  SKIN: Warm and dry NEUROLOGIC:  Alert and oriented x 3 PSYCHIATRIC:  Normal affect    Signed, Bruce More, MD  01/24/2021 9:19 AM    [place]

## 2021-01-24 ENCOUNTER — Telehealth: Payer: Self-pay

## 2021-01-24 ENCOUNTER — Other Ambulatory Visit: Payer: Self-pay

## 2021-01-24 ENCOUNTER — Encounter: Payer: Self-pay | Admitting: Cardiology

## 2021-01-24 ENCOUNTER — Ambulatory Visit (INDEPENDENT_AMBULATORY_CARE_PROVIDER_SITE_OTHER): Payer: Medicare Other | Admitting: Cardiology

## 2021-01-24 VITALS — BP 151/61 | HR 66 | Ht 67.0 in | Wt 156.6 lb

## 2021-01-24 DIAGNOSIS — I351 Nonrheumatic aortic (valve) insufficiency: Secondary | ICD-10-CM | POA: Diagnosis not present

## 2021-01-24 DIAGNOSIS — I10 Essential (primary) hypertension: Secondary | ICD-10-CM | POA: Diagnosis not present

## 2021-01-24 DIAGNOSIS — E7849 Other hyperlipidemia: Secondary | ICD-10-CM | POA: Diagnosis not present

## 2021-01-24 DIAGNOSIS — I350 Nonrheumatic aortic (valve) stenosis: Secondary | ICD-10-CM | POA: Diagnosis not present

## 2021-01-24 LAB — BASIC METABOLIC PANEL
BUN/Creatinine Ratio: 17 (ref 10–24)
BUN: 19 mg/dL (ref 8–27)
CO2: 23 mmol/L (ref 20–29)
Calcium: 9.2 mg/dL (ref 8.6–10.2)
Chloride: 98 mmol/L (ref 96–106)
Creatinine, Ser: 1.09 mg/dL (ref 0.76–1.27)
Glucose: 190 mg/dL — ABNORMAL HIGH (ref 65–99)
Potassium: 4.1 mmol/L (ref 3.5–5.2)
Sodium: 136 mmol/L (ref 134–144)
eGFR: 69 mL/min/{1.73_m2} (ref >59)

## 2021-01-24 MED ORDER — TELMISARTAN 20 MG PO TABS: 20.0000 mg | ORAL_TABLET | Freq: Every day | ORAL | 3 refills | Status: DC

## 2021-01-24 NOTE — Patient Instructions (Signed)
Medication Instructions:  Your physician has recommended you make the following change in your medication:  STOP: Losartan START: Telmisartan 20 mg take one tablet by mouth daily.  *If you need a refill on your cardiac medications before your next appointment, please call your pharmacy*   Lab Work: Your physician recommends that you return for lab work in: TODAY [place], ProBNP If you have labs (blood work) drawn today and your tests are completely normal, you will receive your results only by: Marland Kitchen MyChart Message (if you have MyChart) OR . A paper copy in the mail If you have any lab test that is abnormal or we need to change your treatment, we will call you to review the results.   Testing/Procedures: You are scheduled for Cardiac MRI on ____________  ?  Magnetic resonance imaging (MRI) is a painless test that produces images of the inside of the body without using X-rays. During an MRI, strong magnets and radio waves work together in a Research officer, political party to form detailed images.   MRI images may provide more details about a medical condition than X-rays, CT scans, and ultrasounds can provide.   You may be given earphones to listen for instructions.   You may eat a light breakfast and take medications as ordered.  If a contrast material will be used, an IV will be inserted into one of your veins. Contrast material will be injected into your IV.   You will be asked to remove all metal, including: Watch, jewelry, and other metal objects including hearing aids, hair pieces and dentures. (Braces and fillings normally are not a problem.)   If contrast material was used:  It will leave your body through your urine within a day. You may be told to drink plenty of fluids to help flush the contrast material out of your system.   TEST WILL TAKE APPROXIMATELY 1 HOUR  PLEASE NOTIFY SCHEDULING AT LEAST 24 HOURS IN ADVANCE IF YOU ARE UNABLE TO KEEP YOUR APPOINTMENT.     Follow-Up: At [facility], you and your health needs are our priority.  As part of our continuing mission to provide you with exceptional heart care, we have created designated Provider Care Teams.  These Care Teams include your primary Cardiologist (physician) and Advanced Practice Providers (APPs -  Physician Assistants and Nurse Practitioners) who all work together to provide you with the care you need, when you need it.  We recommend signing up for the patient portal called "MyChart".  Sign up information is provided on this After Visit Summary.  MyChart is used to connect with patients for Virtual Visits (Telemedicine).  Patients are able to view lab/test results, encounter notes, upcoming appointments, etc.  Non-urgent messages can be sent to your provider as well.   To learn more about what you can do with MyChart, go to [email or web address].    Your next appointment:   6 month(s)  The format for your next appointment:   In Person  Provider:   Shirlee More, MD   Other Instructions

## 2021-01-24 NOTE — Telephone Encounter (Signed)
Spoke with patient regarding results and recommendation.  Patient verbalizes understanding and is agreeable to plan of care. Advised patient to call back with any issues or concerns.  

## 2021-01-24 NOTE — Telephone Encounter (Signed)
-----   Message from Richardo Priest, MD sent at 01/24/2021  4:42 PM EDT ----- Good result normal no changes

## 2021-01-26 ENCOUNTER — Telehealth: Payer: Self-pay | Admitting: Cardiology

## 2021-01-26 NOTE — Telephone Encounter (Signed)
Spoke with patient regarding preferred scheduling weekdays and times for the Cardiac MRI ordered bu Dr. Johnney Killian patient as soon as we hear regarding the insurance prior authorization-we will be able to schedule the appointment.  He voiced his understanding.

## 2021-01-27 ENCOUNTER — Other Ambulatory Visit: Payer: Self-pay | Admitting: Cardiology

## 2021-01-30 ENCOUNTER — Encounter: Payer: Self-pay | Admitting: Cardiology

## 2021-01-30 NOTE — Telephone Encounter (Signed)
Left message for patient regarding the  Tuesday 03/07/21 12:00pm Cardiac MRI appointment at [facility]---arrival time is 11:30 am--1st floor admissions office----will mail information to patient and requested he call with questions or concerns.

## 2021-02-28 DIAGNOSIS — Z961 Presence of intraocular lens: Secondary | ICD-10-CM | POA: Diagnosis not present

## 2021-02-28 DIAGNOSIS — H40013 Open angle with borderline findings, low risk, bilateral: Secondary | ICD-10-CM | POA: Diagnosis not present

## 2021-02-28 DIAGNOSIS — E119 Type 2 diabetes mellitus without complications: Secondary | ICD-10-CM | POA: Diagnosis not present

## 2021-02-28 DIAGNOSIS — H35033 Hypertensive retinopathy, bilateral: Secondary | ICD-10-CM | POA: Diagnosis not present

## 2021-03-06 ENCOUNTER — Telehealth (HOSPITAL_COMMUNITY): Payer: Self-pay | Admitting: Emergency Medicine

## 2021-03-06 NOTE — Telephone Encounter (Signed)
Reaching out to patient to offer assistance regarding upcoming cardiac imaging study; pt verbalizes understanding of appt date/time, parking situation and where to check in, and verified current allergies; name and call back number provided for further questions should they arise Marchia Bond RN Navigator Cardiac Imaging [facility] [phone] office [phone] cell  Denies claustro Denies implants Mahlik Lenn

## 2021-03-07 ENCOUNTER — Other Ambulatory Visit: Payer: Self-pay

## 2021-03-07 ENCOUNTER — Ambulatory Visit (HOSPITAL_COMMUNITY)
Admission: RE | Admit: 2021-03-07 | Discharge: 2021-03-07 | Disposition: A | Discharge status: Home / Self Care | Payer: Medicare Other | Source: Ambulatory Visit | Attending: Cardiology | Admitting: Cardiology

## 2021-03-07 DIAGNOSIS — I350 Nonrheumatic aortic (valve) stenosis: Secondary | ICD-10-CM | POA: Diagnosis not present

## 2021-03-07 DIAGNOSIS — I351 Nonrheumatic aortic (valve) insufficiency: Secondary | ICD-10-CM | POA: Insufficient documentation

## 2021-03-07 MED ORDER — GADOBUTROL 1 MMOL/ML IV SOLN
8.0000 mL | Freq: Once | INTRAVENOUS | Status: AC | PRN
  Administered 2021-03-07: 8 mL via INTRAVENOUS

## 2021-04-11 DIAGNOSIS — L570 Actinic keratosis: Secondary | ICD-10-CM | POA: Diagnosis not present

## 2021-04-11 DIAGNOSIS — L821 Other seborrheic keratosis: Secondary | ICD-10-CM | POA: Diagnosis not present

## 2021-04-11 DIAGNOSIS — L578 Other skin changes due to chronic exposure to nonionizing radiation: Secondary | ICD-10-CM | POA: Diagnosis not present

## 2021-04-25 DIAGNOSIS — E785 Hyperlipidemia, unspecified: Secondary | ICD-10-CM | POA: Diagnosis not present

## 2021-04-25 DIAGNOSIS — J45909 Unspecified asthma, uncomplicated: Secondary | ICD-10-CM | POA: Diagnosis not present

## 2021-04-25 DIAGNOSIS — E538 Deficiency of other specified B group vitamins: Secondary | ICD-10-CM | POA: Diagnosis not present

## 2021-04-25 DIAGNOSIS — Z1331 Encounter for screening for depression: Secondary | ICD-10-CM | POA: Diagnosis not present

## 2021-04-25 DIAGNOSIS — T63421A Toxic effect of venom of ants, accidental (unintentional), initial encounter: Secondary | ICD-10-CM | POA: Diagnosis not present

## 2021-04-25 DIAGNOSIS — Z9181 History of falling: Secondary | ICD-10-CM | POA: Diagnosis not present

## 2021-04-25 DIAGNOSIS — E063 Autoimmune thyroiditis: Secondary | ICD-10-CM | POA: Diagnosis not present

## 2021-04-25 DIAGNOSIS — I10 Essential (primary) hypertension: Secondary | ICD-10-CM | POA: Diagnosis not present

## 2021-04-25 DIAGNOSIS — Z139 Encounter for screening, unspecified: Secondary | ICD-10-CM | POA: Diagnosis not present

## 2021-04-25 DIAGNOSIS — Z6824 Body mass index (BMI) 24.0-24.9, adult: Secondary | ICD-10-CM | POA: Diagnosis not present

## 2021-04-25 DIAGNOSIS — Z8546 Personal history of malignant neoplasm of prostate: Secondary | ICD-10-CM | POA: Diagnosis not present

## 2021-04-25 DIAGNOSIS — E559 Vitamin D deficiency, unspecified: Secondary | ICD-10-CM | POA: Diagnosis not present

## 2021-05-23 DIAGNOSIS — Z23 Encounter for immunization: Secondary | ICD-10-CM | POA: Diagnosis not present

## 2021-07-17 ENCOUNTER — Other Ambulatory Visit: Payer: Self-pay | Admitting: Cardiology

## 2021-07-27 DIAGNOSIS — Z8546 Personal history of malignant neoplasm of prostate: Secondary | ICD-10-CM | POA: Diagnosis not present

## 2021-07-28 ENCOUNTER — Ambulatory Visit (INDEPENDENT_AMBULATORY_CARE_PROVIDER_SITE_OTHER): Payer: Medicare Other | Admitting: Cardiology

## 2021-07-28 ENCOUNTER — Other Ambulatory Visit: Payer: Self-pay

## 2021-07-28 ENCOUNTER — Encounter: Payer: Self-pay | Admitting: Cardiology

## 2021-07-28 VITALS — BP 172/62 | HR 62 | Ht 67.0 in | Wt 155.0 lb

## 2021-07-28 DIAGNOSIS — I350 Nonrheumatic aortic (valve) stenosis: Secondary | ICD-10-CM | POA: Diagnosis not present

## 2021-07-28 DIAGNOSIS — I10 Essential (primary) hypertension: Secondary | ICD-10-CM | POA: Diagnosis not present

## 2021-07-28 DIAGNOSIS — I351 Nonrheumatic aortic (valve) insufficiency: Secondary | ICD-10-CM | POA: Diagnosis not present

## 2021-07-28 DIAGNOSIS — E7849 Other hyperlipidemia: Secondary | ICD-10-CM

## 2021-07-28 NOTE — Patient Instructions (Signed)
Medication Instructions:  Your physician recommends that you continue on your current medications as directed. Please refer to the Current Medication list given to you today.  *If you need a refill on your cardiac medications before your next appointment, please call your pharmacy*   Lab Work: None If you have labs (blood work) drawn today and your tests are completely normal, you will receive your results only by: [place] (if you have MyChart) OR A paper copy in the mail If you have any lab test that is abnormal or we need to change your treatment, we will call you to review the results.   Testing/Procedures: Your physician has requested that you have a cardiac MRI in July 2023. Cardiac MRI uses a computer to create images of your heart as its beating, producing both still and moving pictures of your heart and major blood vessels. For further information please visit [email or web address] Please follow the instruction sheet given to you today for more information.    Follow-Up: At [facility], you and your health needs are our priority.  As part of our continuing mission to provide you with exceptional heart care, we have created designated Provider Care Teams.  These Care Teams include your primary Cardiologist (physician) and Advanced Practice Providers (APPs -  Physician Assistants and Nurse Practitioners) who all work together to provide you with the care you need, when you need it.  We recommend signing up for the patient portal called "MyChart".  Sign up information is provided on this After Visit Summary.  MyChart is used to connect with patients for Virtual Visits (Telemedicine).  Patients are able to view lab/test results, encounter notes, upcoming appointments, etc.  Non-urgent messages can be sent to your provider as well.   To learn more about what you can do with MyChart, go to [email or web address].    Your next appointment:   7 month(s)  The format for your next  appointment:   In Person  Provider:   Shirlee More, MD    Other Instructions

## 2021-07-28 NOTE — Progress Notes (Signed)
Cardiology Office Note:    Date:  07/28/2021   ID:  Bruce Nunez, DOB 09/12/1940, MRN [id number]  PCP:  Ocie Doyne., MD  Cardiologist:  Shirlee More, MD    Referring MD: Ocie Doyne., MD    ASSESSMENT:    1. Nonrheumatic aortic valve insufficiency   2. Nonrheumatic aortic valve stenosis   3. Essential hypertension   4. 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 Stable blood pressure I would lower systolic and he less with a diastolic in the 29J from his aortic regurgitation and continue beta-blocker thiazide diuretic and ACE inhibitor and ARB Stable hyperlipidemia LDL at target continue with statin   Next appointment: Seen in the office after his echocardiogram February 25, 2022   Medication Adjustments/Labs and Tests Ordered: Current medicines are reviewed at length with the patient today.  Concerns regarding medicines are outlined above.  No orders of the defined types were placed in this encounter.  No orders of the defined types were placed in this encounter.   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 male with a hx of hypertension hyperlipidemia type 2 diabetes and aortic valve disease with moderate aortic regurgitation mild aortic stenosis last seen 01/24/2021.  Compliance with diet, lifestyle and medications: Yes  Since his last visit he is doing very well no palpitations syncope edema shortness of breath or chest pain and he tells me that at this point in his life is the best he has felt. Recent labs 04/25/2021 cholesterol 145 LDL 90 triglycerides 114  HDL 34 A1c 6.4% hemoglobin 14.8 creatinine 1.07 potassium 4.4.  Echocardiogram performed 05/30/2020 showed ejection fraction of 55 to 60% with moderate left ventricular hypertrophy mild mitral regurgitation moderate to severe aortic regurgitation pressure half-time 344 ms mild  aortic stenosis peak and mean gradients 20 and 11 mmHg with an aortic valve area by VTI 1.11cm.  I independently reviewed the echocardiogram there is no flow reversal in the thoracic aorta.   Cardiac MRI performed 03/07/2021 confirmed moderate aortic regurgitation: IMPRESSION: 1.  Normal left ventricular size and systolic function, EF 24%. 2.  Normal right ventricular size and systolic function, EF 26%.  Hello 3. The aortic insufficiency appears moderate. Regurgitant fraction is calculated at 41%. The LV is not dilated. The valve is trileaflet. Past Medical History:  Diagnosis Date   Anemia    recent iron supplement   Asthma    childhood-   2013 bronchitis w/wheezing   Complication of anesthesia    " ether made me sick" - "woke up during colonoscopy"   Diabetes mellitus without complication ([place])    Frequency of urination    GERD (gastroesophageal reflux disease)    occasional   Heart murmur    Hemorrhoids    History of kidney stones    Hx of colon cancer, stage I    Hx of nonmelanoma skin cancer    Hx of peptic ulcer    as teenager   Hx of transfusion    Hyperlipidemia    Hypertension  Inguinal hernia    PONV (postoperative nausea and vomiting)    only when had Ether as Anesthesia drug   Prostate cancer ([place]) 08/15/12   also Hx colon cancer / Hx skin cancer   Right inguinal hernia 05/08/2016   S/P right inguinal hernia repair Sept 2015 05/17/2014   Recurrent right indirect  Hernia treated with robotic TAPP repair with enterolysis Sept 2017    Past Surgical History:  Procedure Laterality Date   cataract surgery  2009   CYSTOSCOPY     INGUINAL HERNIA REPAIR Right 05/17/2014   Procedure: RIGHT  INGUINAL HERNIA REPAIR ;  Surgeon: Kaylyn Lim, MD;  Location: [facility];  Service: General;  Laterality: Right;  With MESH   KNEE SURGERY Right 2012   PARTIAL COLECTOMY  2005   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 Right 05/08/2016   Procedure: XI ROBOTIC ASSISTED REPAIR OF RECURRENT RIGHT INGUINAL HERNIA;  Surgeon: Johnathan Hausen, MD;  Location: [facility];  Service: General;  Laterality: Right;  With MESH    Current Medications: Current Meds  Medication Sig   albuterol (PROVENTIL HFA;VENTOLIN HFA) 108 (90 BASE) MCG/ACT inhaler Inhale 2 puffs into the lungs every 6 (six) hours as needed for wheezing.   amLODipine (NORVASC) 5 MG tablet Take 1 tablet (5 mg total) by mouth every other day.   bisoprolol-hydrochlorothiazide (ZIAC) 10-6.25 MG per tablet Take 1 tablet by mouth every morning.    cyanocobalamin (,VITAMIN B-12,) 1000 MCG/ML injection Inject 1 mL into the skin every 30 (thirty) days.    fexofenadine (ALLEGRA) 180 MG tablet Take 180 mg by mouth daily.   fluticasone (FLONASE) 50 MCG/ACT nasal spray Place 1 spray into both nostrils daily.    fluticasone-salmeterol (ADVAIR HFA) 115-21 MCG/ACT inhaler Inhale 2 puffs into the lungs 2 (two) times daily.   isosorbide mononitrate (IMDUR) 30 MG 24 hr tablet Take 1 tablet (30 mg total) by mouth daily. Please keep appointment on 01-24-21 for further refills.   levothyroxine (SYNTHROID) 25 MCG tablet Take 1 tablet by mouth 3 (three) times a week.   Loperamide HCl (IMODIUM A-D PO) Take 1 capsule by mouth as needed.   losartan (COZAAR) 100 MG tablet  Take 100 mg by mouth every other day.    lovastatin (MEVACOR) 10 MG tablet Take 10 mg by mouth 3 (three) times a week.   metFORMIN (GLUCOPHAGE-XR) 500 MG 24 hr tablet Take 500 mg by mouth 2 (two) times daily.   nitroGLYCERIN (NITROSTAT) 0.4 MG SL tablet Place 1 tablet (0.4 mg total) under the tongue every 5 (five) minutes as needed for chest pain.   omeprazole (PRILOSEC) 20 MG capsule Take 20 mg by mouth daily.   Probiotic Product (PROBIOTIC DAILY PO) Take 1 capsule by mouth daily.   solifenacin (VESICARE) 10 MG tablet Take 10 mg by mouth daily.   Tamsulosin HCl (FLOMAX) 0.4 MG CAPS Take 0.4 mg by mouth 2 (two) times daily.    telmisartan (MICARDIS) 20 MG tablet Take 1 tablet (20 mg total) by mouth daily.  Allergies:   Penicillins, Aspirin, Monodox [doxycycline hyclate], Sulfa antibiotics, and Tramadol   Social History   Socioeconomic History   Marital status: Married    Spouse name: Not on file   Number of children: 2   Years of education: Not on file   Highest education level: Not on file  Occupational History   Occupation: Retired    Comment: Charity fundraiser  Tobacco Use   Smoking status: Never    Passive exposure: Never   Smokeless tobacco: Never  Vaping Use   Vaping Use: Never used  Substance and Sexual Activity   Alcohol use: No   Drug use: No   Sexual activity: Not on file  Other Topics Concern   Not on file  Social History Narrative   Not on file   Social Determinants of Health   Financial Resource Strain: Not on file  Food Insecurity: Not on file  Transportation Needs: Not on file  Physical Activity: Not on file  Stress: Not on file  Social Connections: Not on file     Family History: The patient's family history includes Brain cancer in an other family member; Coronary artery disease in an other family member; Heart attack in an other family member; Prostate cancer in an other family member. ROS:   Please see the history of present illness.    All other  systems reviewed and are negative.  EKGs/Labs/Other Studies Reviewed:    The following studies were reviewed today:    Recent Labs: 01/24/2021: BUN 19; Creatinine, Ser 1.09; Potassium 4.1; Sodium 136  Recent Lipid Panel    Component Value Date/Time   CHOL 150 12/09/2019 0957   TRIG 159 (H) 12/09/2019 0957   HDL 32 (L) 12/09/2019 0957   CHOLHDL 4.7 12/09/2019 0957   LDLCALC 90 12/09/2019 0957    Physical Exam:    VS:  BP (!) 172/62   Pulse 62   Ht 5\' 7"  (1.702 m)   Wt 155 lb (70.3 kg)   SpO2 99%   BMI 24.28 kg/m     Wt Readings from Last 3 Encounters:  07/28/21 155 lb (70.3 kg)  01/24/21 156 lb 9.6 oz (71 kg)  12/09/19 164 lb (74.4 kg)    Blood pressure by me 138/50 GEN: He looks younger than his age well nourished, well developed in no acute distress HEENT: Normal NECK: No JVD; No carotid bruits he has prominent pulsation and pistol shot in his brachial artery LYMPHATICS: No lymphadenopathy CARDIAC: He has grade 2/6 murmur aortic regurgitation aortic area left sternal border.  RRR, RESPIRATORY:  Clear to auscultation without rales, wheezing or rhonchi  ABDOMEN: Soft, non-tender, non-distended MUSCULOSKELETAL:  No edema; No deformity  SKIN: Warm and dry NEUROLOGIC:  Alert and oriented x 3 PSYCHIATRIC:  Normal affect    Signed, Shirlee More, MD  07/28/2021 2:16 PM    [facility]

## 2021-08-03 DIAGNOSIS — N401 Enlarged prostate with lower urinary tract symptoms: Secondary | ICD-10-CM | POA: Diagnosis not present

## 2021-08-03 DIAGNOSIS — N3943 Post-void dribbling: Secondary | ICD-10-CM | POA: Diagnosis not present

## 2021-08-10 DIAGNOSIS — J40 Bronchitis, not specified as acute or chronic: Secondary | ICD-10-CM | POA: Diagnosis not present

## 2021-08-30 DIAGNOSIS — E118 Type 2 diabetes mellitus with unspecified complications: Secondary | ICD-10-CM | POA: Diagnosis not present

## 2021-08-30 DIAGNOSIS — E063 Autoimmune thyroiditis: Secondary | ICD-10-CM | POA: Diagnosis not present

## 2021-08-30 DIAGNOSIS — K2100 Gastro-esophageal reflux disease with esophagitis, without bleeding: Secondary | ICD-10-CM | POA: Diagnosis not present

## 2021-08-30 DIAGNOSIS — J45909 Unspecified asthma, uncomplicated: Secondary | ICD-10-CM | POA: Diagnosis not present

## 2021-08-30 DIAGNOSIS — I209 Angina pectoris, unspecified: Secondary | ICD-10-CM | POA: Diagnosis not present

## 2021-08-30 DIAGNOSIS — Z125 Encounter for screening for malignant neoplasm of prostate: Secondary | ICD-10-CM | POA: Diagnosis not present

## 2021-08-30 DIAGNOSIS — E559 Vitamin D deficiency, unspecified: Secondary | ICD-10-CM | POA: Diagnosis not present

## 2021-08-30 DIAGNOSIS — Z862 Personal history of diseases of the blood and blood-forming organs and certain disorders involving the immune mechanism: Secondary | ICD-10-CM | POA: Diagnosis not present

## 2021-08-30 DIAGNOSIS — I10 Essential (primary) hypertension: Secondary | ICD-10-CM | POA: Diagnosis not present

## 2021-08-30 DIAGNOSIS — Z6823 Body mass index (BMI) 23.0-23.9, adult: Secondary | ICD-10-CM | POA: Diagnosis not present

## 2021-08-30 DIAGNOSIS — E785 Hyperlipidemia, unspecified: Secondary | ICD-10-CM | POA: Diagnosis not present

## 2021-10-02 ENCOUNTER — Other Ambulatory Visit: Payer: Self-pay | Admitting: Cardiology

## 2021-10-03 DIAGNOSIS — R0981 Nasal congestion: Secondary | ICD-10-CM | POA: Diagnosis not present

## 2021-10-03 DIAGNOSIS — U071 COVID-19: Secondary | ICD-10-CM | POA: Diagnosis not present

## 2021-10-03 DIAGNOSIS — R52 Pain, unspecified: Secondary | ICD-10-CM | POA: Diagnosis not present

## 2021-10-12 DIAGNOSIS — L578 Other skin changes due to chronic exposure to nonionizing radiation: Secondary | ICD-10-CM | POA: Diagnosis not present

## 2021-10-12 DIAGNOSIS — L821 Other seborrheic keratosis: Secondary | ICD-10-CM | POA: Diagnosis not present

## 2021-10-12 DIAGNOSIS — L570 Actinic keratosis: Secondary | ICD-10-CM | POA: Diagnosis not present

## 2021-11-03 DIAGNOSIS — R052 Subacute cough: Secondary | ICD-10-CM | POA: Diagnosis not present

## 2021-11-06 DIAGNOSIS — R051 Acute cough: Secondary | ICD-10-CM | POA: Diagnosis not present

## 2021-11-06 DIAGNOSIS — J189 Pneumonia, unspecified organism: Secondary | ICD-10-CM | POA: Diagnosis not present

## 2021-11-10 ENCOUNTER — Other Ambulatory Visit: Payer: Self-pay | Admitting: Cardiology

## 2021-12-28 DIAGNOSIS — I209 Angina pectoris, unspecified: Secondary | ICD-10-CM | POA: Diagnosis not present

## 2021-12-28 DIAGNOSIS — E782 Mixed hyperlipidemia: Secondary | ICD-10-CM | POA: Diagnosis not present

## 2021-12-28 DIAGNOSIS — E1169 Type 2 diabetes mellitus with other specified complication: Secondary | ICD-10-CM | POA: Diagnosis not present

## 2021-12-28 DIAGNOSIS — I10 Essential (primary) hypertension: Secondary | ICD-10-CM | POA: Diagnosis not present

## 2021-12-28 DIAGNOSIS — E034 Atrophy of thyroid (acquired): Secondary | ICD-10-CM | POA: Diagnosis not present

## 2021-12-28 DIAGNOSIS — K219 Gastro-esophageal reflux disease without esophagitis: Secondary | ICD-10-CM | POA: Diagnosis not present

## 2021-12-28 DIAGNOSIS — I351 Nonrheumatic aortic (valve) insufficiency: Secondary | ICD-10-CM | POA: Diagnosis not present

## 2021-12-28 DIAGNOSIS — Z6823 Body mass index (BMI) 23.0-23.9, adult: Secondary | ICD-10-CM | POA: Diagnosis not present

## 2022-02-13 DIAGNOSIS — R197 Diarrhea, unspecified: Secondary | ICD-10-CM | POA: Diagnosis not present

## 2022-02-13 DIAGNOSIS — R634 Abnormal weight loss: Secondary | ICD-10-CM | POA: Diagnosis not present

## 2022-02-13 DIAGNOSIS — Z6822 Body mass index (BMI) 22.0-22.9, adult: Secondary | ICD-10-CM | POA: Diagnosis not present

## 2022-02-13 DIAGNOSIS — R531 Weakness: Secondary | ICD-10-CM | POA: Diagnosis not present

## 2022-02-23 DIAGNOSIS — R197 Diarrhea, unspecified: Secondary | ICD-10-CM | POA: Diagnosis not present

## 2022-03-06 DIAGNOSIS — H40013 Open angle with borderline findings, low risk, bilateral: Secondary | ICD-10-CM | POA: Diagnosis not present

## 2022-03-06 DIAGNOSIS — H34231 Retinal artery branch occlusion, right eye: Secondary | ICD-10-CM | POA: Diagnosis not present

## 2022-03-06 DIAGNOSIS — E119 Type 2 diabetes mellitus without complications: Secondary | ICD-10-CM | POA: Diagnosis not present

## 2022-03-06 DIAGNOSIS — H35033 Hypertensive retinopathy, bilateral: Secondary | ICD-10-CM | POA: Diagnosis not present

## 2022-03-19 ENCOUNTER — Other Ambulatory Visit (HOSPITAL_COMMUNITY): Payer: Self-pay | Admitting: *Deleted

## 2022-03-19 DIAGNOSIS — I350 Nonrheumatic aortic (valve) stenosis: Secondary | ICD-10-CM

## 2022-03-20 ENCOUNTER — Telehealth (HOSPITAL_COMMUNITY): Payer: Self-pay | Admitting: *Deleted

## 2022-03-20 ENCOUNTER — Other Ambulatory Visit (HOSPITAL_COMMUNITY): Payer: Self-pay | Admitting: *Deleted

## 2022-03-20 DIAGNOSIS — I350 Nonrheumatic aortic (valve) stenosis: Secondary | ICD-10-CM

## 2022-03-20 NOTE — Telephone Encounter (Signed)
Reaching out to patient to offer assistance regarding upcoming cardiac imaging study; pt verbalizes understanding of appt date/time, parking situation and where to check in, pre-test NPO status; name and call back number provided for further questions should they arise  Bruce Clement RN Navigator Cardiac Imaging [facility] [phone] office [phone] cell  Patient aware to obtain labs prior to cardiac MRI. He states he did the fine with his cardiac MRI in July of 2022.

## 2022-03-21 ENCOUNTER — Ambulatory Visit
Admission: RE | Admit: 2022-03-21 | Discharge: 2022-03-21 | Disposition: A | Discharge status: Home / Self Care | Payer: Medicare Other | Source: Ambulatory Visit | Attending: Cardiology | Admitting: Cardiology

## 2022-03-21 ENCOUNTER — Other Ambulatory Visit: Payer: Self-pay | Admitting: Cardiology

## 2022-03-21 DIAGNOSIS — I350 Nonrheumatic aortic (valve) stenosis: Secondary | ICD-10-CM

## 2022-03-21 LAB — HEMOGLOBIN AND HEMATOCRIT, BLOOD
Hematocrit: 40.9 % (ref 37.5–51.0)
Hemoglobin: 14 g/dL (ref 13.0–17.7)

## 2022-03-21 MED ORDER — GADOBUTROL 1 MMOL/ML IV SOLN
10.0000 mL | Freq: Once | INTRAVENOUS | Status: AC | PRN
  Administered 2022-03-21: 10 mL via INTRAVENOUS

## 2022-03-22 ENCOUNTER — Telehealth: Payer: Self-pay | Admitting: Cardiology

## 2022-03-22 ENCOUNTER — Telehealth: Payer: Self-pay

## 2022-03-22 NOTE — Telephone Encounter (Signed)
Patient states he is returning a call for test results.

## 2022-03-22 NOTE — Telephone Encounter (Signed)
Spoke with pt about lab results. He stated that he is working with the PA at his PCP office. She has discontinued his Metformin after he had a "Light stroke" June 15. He wanted to be sure this was noted for his office visit on 8-2 to discuss with Dr. Bettina Gavia.

## 2022-03-28 ENCOUNTER — Other Ambulatory Visit: Payer: Self-pay

## 2022-03-28 ENCOUNTER — Ambulatory Visit (INDEPENDENT_AMBULATORY_CARE_PROVIDER_SITE_OTHER): Payer: Medicare Other

## 2022-03-28 ENCOUNTER — Encounter: Payer: Self-pay | Admitting: Cardiology

## 2022-03-28 ENCOUNTER — Ambulatory Visit (INDEPENDENT_AMBULATORY_CARE_PROVIDER_SITE_OTHER): Payer: Medicare Other | Admitting: Cardiology

## 2022-03-28 VITALS — BP 160/70 | HR 69 | Ht 67.0 in | Wt 149.0 lb

## 2022-03-28 DIAGNOSIS — I639 Cerebral infarction, unspecified: Secondary | ICD-10-CM

## 2022-03-28 DIAGNOSIS — E7849 Other hyperlipidemia: Secondary | ICD-10-CM

## 2022-03-28 DIAGNOSIS — I4891 Unspecified atrial fibrillation: Secondary | ICD-10-CM

## 2022-03-28 DIAGNOSIS — I351 Nonrheumatic aortic (valve) insufficiency: Secondary | ICD-10-CM

## 2022-03-28 DIAGNOSIS — Z8679 Personal history of other diseases of the circulatory system: Secondary | ICD-10-CM | POA: Diagnosis not present

## 2022-03-28 DIAGNOSIS — I10 Essential (primary) hypertension: Secondary | ICD-10-CM

## 2022-03-28 DIAGNOSIS — I350 Nonrheumatic aortic (valve) stenosis: Secondary | ICD-10-CM | POA: Diagnosis not present

## 2022-03-28 DIAGNOSIS — R0989 Other specified symptoms and signs involving the circulatory and respiratory systems: Secondary | ICD-10-CM

## 2022-03-28 HISTORY — DX: Cerebral infarction, unspecified: I63.9

## 2022-03-28 MED ORDER — ROSUVASTATIN CALCIUM 20 MG PO TABS: 20.0000 mg | ORAL_TABLET | Freq: Every day | ORAL | 3 refills | Status: DC

## 2022-03-28 NOTE — Progress Notes (Unsigned)
Cardiology Office Note:    Date:  03/28/2022   ID:  MELINDA POTTINGER, DOB 1941-05-22, MRN [id number]  PCP:  Darrol Jump, PA-C  Cardiologist:  Shirlee More, MD    Referring MD: Ocie Doyne., MD    ASSESSMENT:    1. Nonrheumatic aortic valve insufficiency   2. Essential hypertension   3. Cerebrovascular accident (CVA), unspecified mechanism ([place])   4. Other hyperlipidemia   5. Bruit of left carotid artery    PLAN:    In order of problems listed above:  From a cardiology perspective his aortic valve disease is not severe he has no LV dysfunction he is asymptomatic and does not require intervention. In view of his recent stroke continue his current antihypertensive medications thiazide beta-blocker and ARB  Unfortunately have no records of ophthalmology in epic I am [place] set him up for carotid duplex do a ZIO monitor to screen for atrial fibrillation ideally should be on antiplatelet therapy tells me he has repeated admissions to the hospital GI bleeding with aspirin and hold at this time but I will intensify his lipid-lowering therapy with this new diagnosis   Next appointment: 3 months   Medication Adjustments/Labs and Tests Ordered: Current medicines are reviewed at length with the patient today.  Concerns regarding medicines are outlined above.  No orders of the defined types were placed in this encounter.  No orders of the defined types were placed in this encounter.   Chief Complaint  Patient presents with   Follow-up  He is concerned that he has had a retinal stroke  History of Present Illness:    JOSELITO FIELDHOUSE is a 81 y.o. male with a hx of aortic regurgitation hypertension hyperlipidemia and type 2 diabetes. Compliance with diet, lifestyle and medications: Yes  Had gone and a church meeting and had very severe unrelenting diarrhea that he thinks was related to metformin he stopped this. In the midst of this he had profound weakness very unsteady gait difficulty  with balance recovered and subsequently saw an ophthalmologist who told him that he a retinal stroke Saw his PCP had a CT of his head performed that he tells me was normal He takes a low intensity statin lovastatin I will transition to rosuvastatin he is not on aspirin because of recurrent GI bleeding. Physical exam he has a left carotid bruit and tells me had a left retinal embolism and will be set up to have a carotid duplex office as well as a monitor to screen for atrial fibrillation He has had no recurrent diarrhea off metformin He feels back to normal but still has some visual blurring in his eye No edema chest pain shortness of breath palpitation or syncope  Echocardiogram performed 05/30/2020 showed ejection fraction of 55 to 60% with moderate left ventricular hypertrophy mild mitral regurgitation moderate to severe aortic regurgitation pressure half-time 344 ms mild  aortic stenosis peak and mean gradients 20 and 11 mmHg with an aortic valve area by VTI 1.11cm.  I independently reviewed the echocardiogram there is no flow reversal in the thoracic aorta.    He had cardiac MRI performed to evaluate his aortic regurgitation 07/28/2021.  Left ventricle is normal in size wall thickness and systolic function EF 37% with no late gadolinium enhancement.  Right ventricle normal size wall thickness and systolic function EF 81 year old percent the aortic root and ascending aorta and pulmonary arteries were normal in diameter moderate aortic regurgitation was present with a regurgitant fraction of 37% and  volume 24 cc.  FINDINGS: 1. Normal left ventricular size, thickness and systolic function (LVEF = 54%). There are no regional wall motion abnormalities.   There is no late gadolinium enhancement in the left ventricular myocardium.   LVEDV: 123 ml   LVESV: 57 ml   SV: 66 ml   Myocardial mass: 86 g   2. Normal right ventricular size, thickness and systolic function (RVEF = 63%). There are no  regional wall motion abnormalities.   3.  Normal left and right atrial size.   4. Normal size of the aortic root, ascending aorta and pulmonary artery.   5. Moderate aortic regurgitation. Aortic valve measurements: regurgitant fraction = 37%, regurgitant volume = 24 ml   Mild tricuspid regurgitation.   6.  Normal pericardium.  No pericardial effusion.   IMPRESSION: 1.  Normal Biventricular size and function.  LVEF 54%, RVEF 63%.   2.  Moderate aortic valve regurgitation.  Regurgitant fraction 37%.   3.  No evidence of late gadolinium enhancement or infiltration.   4.  Mild tricuspid regurgitation. Past Medical History:  Diagnosis Date   Anemia    recent iron supplement   Asthma    childhood-   2013 bronchitis w/wheezing   Complication of anesthesia    " ether made me sick" - "woke up during colonoscopy"   Diabetes mellitus without complication ([place])    Frequency of urination    GERD (gastroesophageal reflux disease)    occasional   Heart murmur    Hemorrhoids    History of kidney stones    Hx of colon cancer, stage I    Hx of nonmelanoma skin cancer    Hx of peptic ulcer    as teenager   Hx of transfusion    Hyperlipidemia    Hypertension    Inguinal hernia    PONV (postoperative nausea and vomiting)    only when had Ether as Anesthesia drug   Prostate cancer ([place]) 08/15/12   also Hx colon cancer / Hx skin cancer   Right inguinal hernia 05/08/2016   S/P right inguinal hernia repair Sept 2015 05/17/2014   Recurrent right indirect  Hernia treated with robotic TAPP repair with enterolysis Sept 2017    Past Surgical History:  Procedure Laterality Date   cataract surgery  2009   CYSTOSCOPY     INGUINAL HERNIA REPAIR Right 05/17/2014   Procedure: RIGHT INGUINAL HERNIA REPAIR ;  Surgeon: Kaylyn Lim, MD;  Location: [facility];  Service: General;  Laterality: Right;  With MESH   KNEE SURGERY Right 2012   PARTIAL COLECTOMY  2005   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   RADIOACTIVE SEED IMPLANT N/A 11/24/2012   Procedure: RADIOACTIVE SEED IMPLANT;  Surgeon: Bernestine Amass, MD;  Location: [facility];  Service: Urology;  Laterality: N/A;   71seeds implanted  no seeds found in bladder   THROAT SURGERY  1940's   as child (age 61)growth that created fissure/corrective surgery   TONSILLECTOMY     age 54   XI ROBOTIC [place] Right 05/08/2016   Procedure: XI ROBOTIC ASSISTED REPAIR OF RECURRENT RIGHT INGUINAL HERNIA;  Surgeon: Johnathan Hausen, MD;  Location: [facility];  Service: General;  Laterality: Right;  With MESH    Current Medications: Current Meds  Medication Sig   albuterol (PROVENTIL HFA;VENTOLIN HFA) 108 (90 BASE) MCG/ACT inhaler Inhale 2 puffs into the lungs every 6 (six) hours as needed for wheezing.  amLODipine (NORVASC) 5 MG tablet Take 1 tablet (5 mg total) by mouth every other day.   bisoprolol-hydrochlorothiazide (ZIAC) 10-6.25 MG per tablet Take 1 tablet by mouth every morning.    cyanocobalamin (,VITAMIN B-12,) 1000 MCG/ML injection Inject 1 mL into the skin every 30 (thirty) days.    dicyclomine (BENTYL) 10 MG capsule Take 10 mg by mouth 2 (two) times daily as needed for cramping.   fesoterodine (TOVIAZ) 4 MG TB24 tablet Take 4 mg by mouth daily.   fexofenadine (ALLEGRA) 180 MG tablet Take 180 mg by mouth daily.   fluticasone (FLONASE) 50 MCG/ACT nasal spray Place 1 spray into both nostrils daily.    fluticasone-salmeterol (ADVAIR HFA) 115-21 MCG/ACT inhaler Inhale 2 puffs into the lungs 2 (two) times daily.   isosorbide mononitrate (IMDUR) 30 MG 24 hr tablet Take 1 tablet (30 mg total) by mouth daily.   levothyroxine (SYNTHROID) 25 MCG tablet Take 1 tablet by mouth 3 (three) times a week.   Loperamide HCl (IMODIUM A-D PO) Take 1 capsule by mouth as needed.   losartan (COZAAR) 100 MG tablet Take 100 mg by mouth every other day.    metFORMIN (GLUCOPHAGE-XR) 500 MG 24 hr tablet Take  500 mg by mouth 2 (two) times daily.   nitroGLYCERIN (NITROSTAT) 0.4 MG SL tablet Place 1 tablet (0.4 mg total) under the tongue every 5 (five) minutes as needed for chest pain.   omeprazole (PRILOSEC) 20 MG capsule Take 20 mg by mouth daily.   Probiotic Product (PROBIOTIC DAILY PO) Take 1 capsule by mouth daily.   solifenacin (VESICARE) 10 MG tablet Take 10 mg by mouth daily.   Tamsulosin HCl (FLOMAX) 0.4 MG CAPS Take 0.4 mg by mouth 2 (two) times daily.    telmisartan (MICARDIS) 20 MG tablet Take 1 tablet (20 mg total) by mouth daily.   [DISCONTINUED] lovastatin (MEVACOR) 10 MG tablet Take 10 mg by mouth 3 (three) times a week.     Allergies:   Penicillins, Aspirin, Monodox [doxycycline hyclate], Sulfa antibiotics, and Tramadol   Social History   Socioeconomic History   Marital status: Married    Spouse name: Not on file   Number of children: 2   Years of education: Not on file   Highest education level: Not on file  Occupational History   Occupation: Retired    Comment: Charity fundraiser  Tobacco Use   Smoking status: Never    Passive exposure: Never   Smokeless tobacco: Never  Vaping Use   Vaping Use: Never used  Substance and Sexual Activity   Alcohol use: No   Drug use: No   Sexual activity: Not on file  Other Topics Concern   Not on file  Social History Narrative   Not on file   Social Determinants of Health   Financial Resource Strain: Not on file  Food Insecurity: Not on file  Transportation Needs: Not on file  Physical Activity: Not on file  Stress: Not on file  Social Connections: Not on file     Family History: The patient's family history includes Brain cancer in an other family member; Coronary artery disease in an other family member; Heart attack in an other family member; Prostate cancer in an other family member. ROS:   Please see the history of present illness.    All other systems reviewed and are negative.  EKGs/Labs/Other Studies Reviewed:    The  following studies were reviewed today:  EKG:  EKG ordered today and personally reviewed.  The ekg ordered today demonstrates sinus rhythm 69 bpm normal EKG there is no atrial abnormality seen  Recent Labs: 03/20/2022: Hemoglobin 14.0  Recent Lipid Panel    Component Value Date/Time   CHOL 150 12/09/2019 0957   TRIG 159 (H) 12/09/2019 0957   HDL 32 (L) 12/09/2019 0957   CHOLHDL 4.7 12/09/2019 0957   LDLCALC 90 12/09/2019 0957    Physical Exam:    VS:  BP (!) 160/70 (BP Location: Left Arm, Patient Position: Sitting, Cuff Size: Normal)   Pulse 69   Ht '5\' 7"'$  (1.702 m)   Wt 149 lb (67.6 kg)   SpO2 98%   BMI 23.34 kg/m     Wt Readings from Last 3 Encounters:  03/28/22 149 lb (67.6 kg)  07/28/21 155 lb (70.3 kg)  01/24/21 156 lb 9.6 oz (71 kg)     GEN:  Well nourished, well developed in no acute distress HEENT: Normal NECK: No JVD;  LYMPHATICS: No lymphadenopathy CARDIAC: Grade 1/6 to 2/6 AR grade 1/6 to 2/6 aortic outflow murmur left carotid bruit is present RRR,RESPIRATORY:  Clear to auscultation without rales, wheezing or rhonchi  ABDOMEN: Soft, non-tender, non-distended MUSCULOSKELETAL:  No edema; No deformity  SKIN: Warm and dry NEUROLOGIC:  Alert and oriented x 3 PSYCHIATRIC:  Normal affect    Signed, Shirlee More, MD  03/28/2022 11:30 AM    [facility]

## 2022-03-28 NOTE — Patient Instructions (Signed)
Medication Instructions:  Your physician has recommended you make the following change in your medication:   STOP: Lovostatin START: Rosuvastatin 20 mg daily  *If you need a refill on your cardiac medications before your next appointment, please call your pharmacy*   Lab Work: None If you have labs (blood work) drawn today and your tests are completely normal, you will receive your results only by: [place] (if you have MyChart) OR A paper copy in the mail If you have any lab test that is abnormal or we need to change your treatment, we will call you to review the results.   Testing/Procedures: A zio monitor was ordered today. It will remain on for 14 days. You will then return monitor and event diary in provided box. It takes 1-2 weeks for report to be downloaded and returned to [place]. We will call you with the results. If monitor falls off or has orange flashing light, please call Zio for further instructions.   Your physician has requested that you have a carotid duplex. This test is an ultrasound of the carotid arteries in your neck. It looks at blood flow through these arteries that supply the brain with blood. Allow one hour for this exam. There are no restrictions or special instructions.    Follow-Up: At [facility], you and your health needs are our priority.  As part of our continuing mission to provide you with exceptional heart care, we have created designated Provider Care Teams.  These Care Teams include your primary Cardiologist (physician) and Advanced Practice Providers (APPs -  Physician Assistants and Nurse Practitioners) who all work together to provide you with the care you need, when you need it.  We recommend signing up for the patient portal called "MyChart".  Sign up information is provided on this After Visit Summary.  MyChart is used to connect with patients for Virtual Visits (Telemedicine).  Patients are able to view lab/test results, encounter notes,  upcoming appointments, etc.  Non-urgent messages can be sent to your provider as well.   To learn more about what you can do with MyChart, go to [email or web address].    Your next appointment:   3 month(s)  The format for your next appointment:   In Person  Provider:   Shirlee More, MD    Other Instructions None  Important Information About Sugar

## 2022-04-03 ENCOUNTER — Ambulatory Visit (INDEPENDENT_AMBULATORY_CARE_PROVIDER_SITE_OTHER): Payer: Medicare Other

## 2022-04-03 DIAGNOSIS — Z8673 Personal history of transient ischemic attack (TIA), and cerebral infarction without residual deficits: Secondary | ICD-10-CM | POA: Diagnosis not present

## 2022-04-03 DIAGNOSIS — I351 Nonrheumatic aortic (valve) insufficiency: Secondary | ICD-10-CM | POA: Diagnosis not present

## 2022-04-03 DIAGNOSIS — E7849 Other hyperlipidemia: Secondary | ICD-10-CM | POA: Diagnosis not present

## 2022-04-03 DIAGNOSIS — I350 Nonrheumatic aortic (valve) stenosis: Secondary | ICD-10-CM

## 2022-04-04 ENCOUNTER — Telehealth: Payer: Self-pay

## 2022-04-04 NOTE — Telephone Encounter (Signed)
Patient notified of results.

## 2022-04-04 NOTE — Telephone Encounter (Signed)
-----   Message from Richardo Priest, MD sent at 04/03/2022  5:56 PM EDT ----- Very good result minimal atherosclerosis in the coronary arteries.

## 2022-04-09 DIAGNOSIS — H26492 Other secondary cataract, left eye: Secondary | ICD-10-CM | POA: Diagnosis not present

## 2022-04-10 DIAGNOSIS — L570 Actinic keratosis: Secondary | ICD-10-CM | POA: Diagnosis not present

## 2022-04-10 DIAGNOSIS — L578 Other skin changes due to chronic exposure to nonionizing radiation: Secondary | ICD-10-CM | POA: Diagnosis not present

## 2022-04-10 DIAGNOSIS — L821 Other seborrheic keratosis: Secondary | ICD-10-CM | POA: Diagnosis not present

## 2022-04-17 DIAGNOSIS — I351 Nonrheumatic aortic (valve) insufficiency: Secondary | ICD-10-CM | POA: Diagnosis not present

## 2022-04-17 DIAGNOSIS — I350 Nonrheumatic aortic (valve) stenosis: Secondary | ICD-10-CM | POA: Diagnosis not present

## 2022-05-01 DIAGNOSIS — Z139 Encounter for screening, unspecified: Secondary | ICD-10-CM | POA: Diagnosis not present

## 2022-05-01 DIAGNOSIS — E782 Mixed hyperlipidemia: Secondary | ICD-10-CM | POA: Diagnosis not present

## 2022-05-01 DIAGNOSIS — E034 Atrophy of thyroid (acquired): Secondary | ICD-10-CM | POA: Diagnosis not present

## 2022-05-01 DIAGNOSIS — Z1331 Encounter for screening for depression: Secondary | ICD-10-CM | POA: Diagnosis not present

## 2022-05-01 DIAGNOSIS — Z6823 Body mass index (BMI) 23.0-23.9, adult: Secondary | ICD-10-CM | POA: Diagnosis not present

## 2022-05-01 DIAGNOSIS — K219 Gastro-esophageal reflux disease without esophagitis: Secondary | ICD-10-CM | POA: Diagnosis not present

## 2022-05-01 DIAGNOSIS — I209 Angina pectoris, unspecified: Secondary | ICD-10-CM | POA: Diagnosis not present

## 2022-05-01 DIAGNOSIS — E1139 Type 2 diabetes mellitus with other diabetic ophthalmic complication: Secondary | ICD-10-CM | POA: Diagnosis not present

## 2022-05-01 DIAGNOSIS — I10 Essential (primary) hypertension: Secondary | ICD-10-CM | POA: Diagnosis not present

## 2022-05-01 DIAGNOSIS — Z9181 History of falling: Secondary | ICD-10-CM | POA: Diagnosis not present

## 2022-05-01 DIAGNOSIS — I351 Nonrheumatic aortic (valve) insufficiency: Secondary | ICD-10-CM | POA: Diagnosis not present

## 2022-05-01 DIAGNOSIS — Z8673 Personal history of transient ischemic attack (TIA), and cerebral infarction without residual deficits: Secondary | ICD-10-CM | POA: Diagnosis not present

## 2022-05-15 DIAGNOSIS — Z23 Encounter for immunization: Secondary | ICD-10-CM | POA: Diagnosis not present

## 2022-05-28 ENCOUNTER — Other Ambulatory Visit: Payer: Self-pay | Admitting: Cardiology

## 2022-05-30 ENCOUNTER — Other Ambulatory Visit: Payer: Self-pay | Admitting: Cardiology

## 2022-05-30 NOTE — Telephone Encounter (Signed)
Refill to pharmacy 

## 2022-06-28 ENCOUNTER — Encounter: Payer: Self-pay | Admitting: Cardiology

## 2022-06-28 ENCOUNTER — Ambulatory Visit: Payer: Medicare Other | Attending: Cardiology | Admitting: Cardiology

## 2022-06-28 VITALS — BP 146/54 | HR 64 | Ht 67.0 in | Wt 151.2 lb

## 2022-06-28 DIAGNOSIS — I350 Nonrheumatic aortic (valve) stenosis: Secondary | ICD-10-CM | POA: Insufficient documentation

## 2022-06-28 DIAGNOSIS — I10 Essential (primary) hypertension: Secondary | ICD-10-CM | POA: Insufficient documentation

## 2022-06-28 DIAGNOSIS — I351 Nonrheumatic aortic (valve) insufficiency: Secondary | ICD-10-CM | POA: Diagnosis not present

## 2022-06-28 DIAGNOSIS — E782 Mixed hyperlipidemia: Secondary | ICD-10-CM | POA: Diagnosis not present

## 2022-06-28 NOTE — Progress Notes (Signed)
Cardiology Office Note:    Date:  06/28/2022   ID:  Bruce Nunez, DOB 09-Jul-1941, MRN [id number]  PCP:  Darrol Jump, PA-C  Cardiologist:  Shirlee More, MD    Referring MD: Darrol Jump, PA-C    ASSESSMENT:    1. Nonrheumatic aortic valve insufficiency   2. Nonrheumatic aortic valve stenosis   3. Essential hypertension   4. Mixed hyperlipidemia    PLAN:    In order of problems listed above:  1 year ago we had done a cardiac MR to sort out of his moderate regurgitation was more severe clinically stable the MR did not show findings of severe AR to prompt consideration of surgical intervention he remains asymptomatic without findings of heart failure exercise intolerance chest pain or syncope.  We will repeat his echocardiogram to follow-up parameters of ventricular function and if he developed dilation or findings of reduced EF we need to consider intervention for mixed aortic stenosis regurgitation. Blood pressure is stable I would not reduce his diastolic any further and continue his current treatment including ARB beta-blocker thiazide diuretic calcium channel blocker. Continue his statin good lipid profile  Next appointment: 9 months   Medication Adjustments/Labs and Tests Ordered: Current medicines are reviewed at length with the patient today.  Concerns regarding medicines are outlined above.  No orders of the defined types were placed in this encounter.  No orders of the defined types were placed in this encounter.   Follow-up for aortic valve disease with regurgitation and stenosis   History of Present Illness:    Bruce Nunez is a 81 y.o. male with a hx of  aortic regurgitation hypertension hyperlipidemia and type 2 diabetes last seen 03/28/2022.  Compliance with diet, lifestyle and medications: Yes  Ahad tracks his blood pressure at home and he tends to run the range of 1 40-1 45 /50-60. He is a bit unsteady in his gait especially when bending over has  had a few falls He does not feel like he is going to faint and he is not lightheaded. I checked his blood pressure in the office and he has no orthostatic shift. He is not having edema shortness of breath chest pain palpitation or syncope. Recent labs 05/01/2022 cholesterol 105 LDL 45 A1c 6.8 creatinine 0.95 potassium 4.4 hemoglobin 15.0  An event monitoring ported 04/23/2022 showing sinus rhythm no pauses or episodes of second or third-degree AV block ventricular ectopy was rare supraventricular ectopy with rare there were no episodes of atrial fibrillation or flutter. Past Medical History:  Diagnosis Date   Anemia    recent iron supplement   Asthma    childhood-   2013 bronchitis w/wheezing   Complication of anesthesia    " ether made me sick" - "woke up during colonoscopy"   Diabetes mellitus without complication ([place])    Frequency of urination    GERD (gastroesophageal reflux disease)    occasional   Heart murmur    Hemorrhoids    History of kidney stones    Hx of colon cancer, stage I    Hx of nonmelanoma skin cancer    Hx of peptic ulcer    as teenager   Hx of transfusion    Hyperlipidemia    Hypertension    Inguinal hernia    PONV (postoperative nausea and vomiting)    only when had Ether as Anesthesia drug   Prostate cancer ([place]) 08/15/12   also Hx colon cancer / Hx skin cancer   Right  inguinal hernia 05/08/2016   S/P right inguinal hernia repair Sept 2015 05/17/2014   Recurrent right indirect  Hernia treated with robotic TAPP repair with enterolysis Sept 2017    Past Surgical History:  Procedure Laterality Date   cataract surgery  2009   CYSTOSCOPY     INGUINAL HERNIA REPAIR Right 05/17/2014   Procedure: RIGHT INGUINAL HERNIA REPAIR ;  Surgeon: Kaylyn Lim, MD;  Location: [facility];  Service: General;  Laterality: Right;  With MESH   KNEE SURGERY Right 2012   PARTIAL COLECTOMY  2005   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   RADIOACTIVE SEED IMPLANT N/A  11/24/2012   Procedure: RADIOACTIVE SEED IMPLANT;  Surgeon: Bernestine Amass, MD;  Location: [facility];  Service: Urology;  Laterality: N/A;   71seeds implanted  no seeds found in bladder   THROAT SURGERY  1940's   as child (age 79)growth that created fissure/corrective surgery   TONSILLECTOMY     age 53   XI ROBOTIC [place] Right 05/08/2016   Procedure: XI ROBOTIC ASSISTED REPAIR OF RECURRENT RIGHT INGUINAL HERNIA;  Surgeon: Johnathan Hausen, MD;  Location: [facility];  Service: General;  Laterality: Right;  With MESH    Current Medications: Current Meds  Medication Sig   albuterol (PROVENTIL HFA;VENTOLIN HFA) 108 (90 BASE) MCG/ACT inhaler Inhale 2 puffs into the lungs every 6 (six) hours as needed for wheezing.   amLODipine (NORVASC) 5 MG tablet Take 1 tablet (5 mg total) by mouth every other day.   bisoprolol-hydrochlorothiazide (ZIAC) 10-6.25 MG per tablet Take 1 tablet by mouth every morning.    cyanocobalamin (,VITAMIN B-12,) 1000 MCG/ML injection Inject 1 mL into the skin every 30 (thirty) days.    dicyclomine (BENTYL) 10 MG capsule Take 10 mg by mouth 2 (two) times daily as needed for cramping.   fexofenadine (ALLEGRA) 180 MG tablet Take 180 mg by mouth daily.   fluticasone (FLONASE) 50 MCG/ACT nasal spray Place 1 spray into both nostrils daily.    fluticasone-salmeterol (ADVAIR HFA) 115-21 MCG/ACT inhaler Inhale 2 puffs into the lungs 2 (two) times daily.   isosorbide mononitrate (IMDUR) 30 MG 24 hr tablet Take 1 tablet (30 mg total) by mouth daily.   levothyroxine (SYNTHROID) 25 MCG tablet Take 1 tablet by mouth 3 (three) times a week.   Loperamide HCl (IMODIUM A-D PO) Take 1 capsule by mouth as needed (Diarrhea).   losartan (COZAAR) 100 MG tablet Take 100 mg by mouth every other day.    nitroGLYCERIN (NITROSTAT) 0.4 MG SL tablet DISSOLVE 1 TABLET UNDER THE TONGUE EVERY 5 MINUTES AS NEEDED FOR CHEST PAIN. DO NOT EXCEED A TOTAL OF 3 DOSES IN  15 MINUTES.   omeprazole (PRILOSEC) 20 MG capsule Take 20 mg by mouth daily.   Probiotic Product (PROBIOTIC DAILY PO) Take 1 capsule by mouth daily.   rosuvastatin (CRESTOR) 20 MG tablet Take 1 tablet (20 mg total) by mouth daily.   solifenacin (VESICARE) 10 MG tablet Take 10 mg by mouth daily.   Tamsulosin HCl (FLOMAX) 0.4 MG CAPS Take 0.4 mg by mouth 2 (two) times daily.    telmisartan (MICARDIS) 20 MG tablet Take 1 tablet (20 mg total) by mouth daily.     Allergies:   Penicillins, Aspirin, Monodox [doxycycline hyclate], Sulfa antibiotics, and Tramadol   Social History   Socioeconomic History   Marital status: Married    Spouse name: Not on file   Number of children:  2   Years of education: Not on file   Highest education level: Not on file  Occupational History   Occupation: Retired    Comment: Textiles  Tobacco Use   Smoking status: Never    Passive exposure: Never   Smokeless tobacco: Never  Vaping Use   Vaping Use: Never used  Substance and Sexual Activity   Alcohol use: No   Drug use: No   Sexual activity: Not on file  Other Topics Concern   Not on file  Social History Narrative   Not on file   Social Determinants of Health   Financial Resource Strain: Not on file  Food Insecurity: Not on file  Transportation Needs: Not on file  Physical Activity: Not on file  Stress: Not on file  Social Connections: Not on file     Family History: The patient's family history includes Brain cancer in an other family member; Coronary artery disease in an other family member; Heart attack in an other family member; Prostate cancer in an other family member. ROS:   Please see the history of present illness.    All other systems reviewed and are negative.  EKGs/Labs/Other Studies Reviewed:    The following studies were reviewed today:  Echoardiogram performed 05/30/2020 showed ejection fraction of 55 to 60% with moderate left ventricular hypertrophy mild mitral  regurgitation moderate to severe aortic regurgitation pressure half-time 344 ms mild  aortic stenosis peak and mean gradients 20 and 11 mmHg with an aortic valve area by VTI 1.11cm.  I independently reviewed the echocardiogram there is no flow reversal in the thoracic aorta.     He had cardiac MRI performed to evaluate his aortic regurgitation 07/28/2021.  Left ventricle is normal in size wall thickness and systolic function EF 47% with no late gadolinium enhancement.  Right ventricle normal size wall thickness and systolic function EF 81 year old percent the aortic root and ascending aorta and pulmonary arteries were normal in diameter moderate aortic regurgitation was present with a regurgitant fraction of 37% and volume 24 cc.   FINDINGS: 1. Normal left ventricular size, thickness and systolic function (LVEF = 54%). There are no regional wall motion abnormalities.   There is no late gadolinium enhancement in the left ventricular myocardium.   LVEDV: 123 ml   LVESV: 57 ml   SV: 66 ml   Myocardial mass: 86 g   2. Normal right ventricular size, thickness and systolic function (RVEF = 63%). There are no regional wall motion abnormalities.   3.  Normal left and right atrial size.   4. Normal size of the aortic root, ascending aorta and pulmonary artery.   5. Moderate aortic regurgitation. Aortic valve measurements: regurgitant fraction = 37%, regurgitant volume = 24 ml   Mild tricuspid regurgitation.   6.  Normal pericardium.  No pericardial effusion.   IMPRESSION: 1.  Normal Biventricular size and function.  LVEF 54%, RVEF 63%.   2.  Moderate aortic valve regurgitation.  Regurgitant fraction 37%.   3.  No evidence of late gadolinium enhancement or infiltration.   4.  Mild tricuspid regurgitation. Recent Labs: 03/20/2022: Hemoglobin 14.0  Recent Lipid Panel    Component Value Date/Time   CHOL 150 12/09/2019 0957   TRIG 159 (H) 12/09/2019 0957   HDL 32 (L) 12/09/2019  0957   CHOLHDL 4.7 12/09/2019 0957   LDLCALC 90 12/09/2019 0957    Physical Exam:    VS:  BP (!) 146/54 (BP Location: Left Arm, Patient Position: Sitting,  Cuff Size: Normal)   Pulse 64   Ht '5\' 7"'$  (1.702 m)   Wt 151 lb 3.2 oz (68.6 kg)   SpO2 99%   BMI 23.68 kg/m     Wt Readings from Last 3 Encounters:  06/28/22 151 lb 3.2 oz (68.6 kg)  03/28/22 149 lb (67.6 kg)  07/28/21 155 lb (70.3 kg)    Blood pressure by me 150/50 sitting and standing right upper extremity GEN:  Well nourished, well developed in no acute distress HEENT: Normal NECK: No JVD; No carotid bruits LYMPHATICS: No lymphadenopathy CARDIAC: He has a grade 2/6 murmur aortic regurgitation left sternal border 1/6 systolic ejection murmur confined to the aortic area S2 is normal does not radiate to the carotids RRR,  RESPIRATORY:  Clear to auscultation without rales, wheezing or rhonchi  ABDOMEN: Soft, non-tender, non-distended MUSCULOSKELETAL:  No edema; No deformity  SKIN: Warm and dry NEUROLOGIC:  Alert and oriented x 3 PSYCHIATRIC:  Normal affect    Signed, Shirlee More, MD  06/28/2022 10:47 AM    [place]

## 2022-06-28 NOTE — Patient Instructions (Addendum)
Medication Instructions:  Your physician recommends that you continue on your current medications as directed. Please refer to the Current Medication list given to you today.  *If you need a refill on your cardiac medications before your next appointment, please call your pharmacy*   Lab Work: None Ordered If you have labs (blood work) drawn today and your tests are completely normal, you will receive your results only by: [place] (if you have MyChart) OR A paper copy in the mail If you have any lab test that is abnormal or we need to change your treatment, we will call you to review the results.   Testing/Procedures: Your physician has requested that you have an echocardiogram. Echocardiography is a painless test that uses sound waves to create images of your heart. It provides your doctor with information about the size and shape of your heart and how well your heart's chambers and valves are working. This procedure takes approximately one hour. There are no restrictions for this procedure. Please do NOT wear cologne, perfume, aftershave, or lotions (deodorant is allowed). Please arrive 15 minutes prior to your appointment time.    Follow-Up: At [facility], you and your health needs are our priority.  As part of our continuing mission to provide you with exceptional heart care, we have created designated Provider Care Teams.  These Care Teams include your primary Cardiologist (physician) and Advanced Practice Providers (APPs -  Physician Assistants and Nurse Practitioners) who all work together to provide you with the care you need, when you need it.  We recommend signing up for the patient portal called "MyChart".  Sign up information is provided on this After Visit Summary.  MyChart is used to connect with patients for Virtual Visits (Telemedicine).  Patients are able to view lab/test results, encounter notes, upcoming appointments, etc.  Non-urgent messages can be sent to your  provider as well.   To learn more about what you can do with MyChart, go to [email or web address].    Your next appointment:   9 month(s)  The format for your next appointment:   In Person  Provider:   Shirlee More, MD    Other Instructions Keep Record of Blood Pressures daily

## 2022-07-03 DIAGNOSIS — I959 Hypotension, unspecified: Secondary | ICD-10-CM | POA: Diagnosis not present

## 2022-07-03 DIAGNOSIS — J208 Acute bronchitis due to other specified organisms: Secondary | ICD-10-CM | POA: Diagnosis not present

## 2022-07-03 DIAGNOSIS — Z6823 Body mass index (BMI) 23.0-23.9, adult: Secondary | ICD-10-CM | POA: Diagnosis not present

## 2022-07-03 DIAGNOSIS — R29898 Other symptoms and signs involving the musculoskeletal system: Secondary | ICD-10-CM | POA: Diagnosis not present

## 2022-07-11 ENCOUNTER — Ambulatory Visit: Payer: Medicare Other | Attending: Cardiology

## 2022-07-11 DIAGNOSIS — I350 Nonrheumatic aortic (valve) stenosis: Secondary | ICD-10-CM | POA: Diagnosis not present

## 2022-07-11 DIAGNOSIS — I351 Nonrheumatic aortic (valve) insufficiency: Secondary | ICD-10-CM | POA: Insufficient documentation

## 2022-07-11 LAB — ECHOCARDIOGRAM COMPLETE
AR max vel: 0.99 cm2
AV Area VTI: 1.11 cm2
AV Area mean vel: 1.01 cm2
AV Mean grad: 14 mmHg
AV Peak grad: 26.1 mmHg
Ao pk vel: 2.56 m/s
Area-P 1/2: 3.74 cm2
P 1/2 time: 314 msec
S' Lateral: 2.5 cm

## 2022-07-24 DIAGNOSIS — M6281 Muscle weakness (generalized): Secondary | ICD-10-CM | POA: Diagnosis not present

## 2022-07-24 DIAGNOSIS — R278 Other lack of coordination: Secondary | ICD-10-CM | POA: Diagnosis not present

## 2022-07-24 DIAGNOSIS — Z9181 History of falling: Secondary | ICD-10-CM | POA: Diagnosis not present

## 2022-07-24 DIAGNOSIS — R29898 Other symptoms and signs involving the musculoskeletal system: Secondary | ICD-10-CM | POA: Diagnosis not present

## 2022-07-24 DIAGNOSIS — R2681 Unsteadiness on feet: Secondary | ICD-10-CM | POA: Diagnosis not present

## 2022-07-26 DIAGNOSIS — Z8546 Personal history of malignant neoplasm of prostate: Secondary | ICD-10-CM | POA: Diagnosis not present

## 2022-08-02 DIAGNOSIS — R2681 Unsteadiness on feet: Secondary | ICD-10-CM | POA: Diagnosis not present

## 2022-08-02 DIAGNOSIS — M6281 Muscle weakness (generalized): Secondary | ICD-10-CM | POA: Diagnosis not present

## 2022-08-02 DIAGNOSIS — Z9181 History of falling: Secondary | ICD-10-CM | POA: Diagnosis not present

## 2022-08-02 DIAGNOSIS — R278 Other lack of coordination: Secondary | ICD-10-CM | POA: Diagnosis not present

## 2022-08-02 DIAGNOSIS — Z8546 Personal history of malignant neoplasm of prostate: Secondary | ICD-10-CM | POA: Diagnosis not present

## 2022-08-02 DIAGNOSIS — R29898 Other symptoms and signs involving the musculoskeletal system: Secondary | ICD-10-CM | POA: Diagnosis not present

## 2022-08-02 DIAGNOSIS — N3281 Overactive bladder: Secondary | ICD-10-CM | POA: Diagnosis not present

## 2022-08-02 DIAGNOSIS — N401 Enlarged prostate with lower urinary tract symptoms: Secondary | ICD-10-CM | POA: Diagnosis not present

## 2022-08-09 DIAGNOSIS — R278 Other lack of coordination: Secondary | ICD-10-CM | POA: Diagnosis not present

## 2022-08-09 DIAGNOSIS — R2681 Unsteadiness on feet: Secondary | ICD-10-CM | POA: Diagnosis not present

## 2022-08-09 DIAGNOSIS — M6281 Muscle weakness (generalized): Secondary | ICD-10-CM | POA: Diagnosis not present

## 2022-08-09 DIAGNOSIS — Z9181 History of falling: Secondary | ICD-10-CM | POA: Diagnosis not present

## 2022-08-09 DIAGNOSIS — R29898 Other symptoms and signs involving the musculoskeletal system: Secondary | ICD-10-CM | POA: Diagnosis not present

## 2022-08-13 DIAGNOSIS — R2681 Unsteadiness on feet: Secondary | ICD-10-CM | POA: Diagnosis not present

## 2022-08-13 DIAGNOSIS — R278 Other lack of coordination: Secondary | ICD-10-CM | POA: Diagnosis not present

## 2022-08-13 DIAGNOSIS — M6281 Muscle weakness (generalized): Secondary | ICD-10-CM | POA: Diagnosis not present

## 2022-08-13 DIAGNOSIS — R29898 Other symptoms and signs involving the musculoskeletal system: Secondary | ICD-10-CM | POA: Diagnosis not present

## 2022-08-13 DIAGNOSIS — Z9181 History of falling: Secondary | ICD-10-CM | POA: Diagnosis not present

## 2022-08-15 DIAGNOSIS — M6281 Muscle weakness (generalized): Secondary | ICD-10-CM | POA: Diagnosis not present

## 2022-08-15 DIAGNOSIS — R2681 Unsteadiness on feet: Secondary | ICD-10-CM | POA: Diagnosis not present

## 2022-08-15 DIAGNOSIS — R278 Other lack of coordination: Secondary | ICD-10-CM | POA: Diagnosis not present

## 2022-08-15 DIAGNOSIS — Z9181 History of falling: Secondary | ICD-10-CM | POA: Diagnosis not present

## 2022-08-15 DIAGNOSIS — R29898 Other symptoms and signs involving the musculoskeletal system: Secondary | ICD-10-CM | POA: Diagnosis not present

## 2022-08-28 DIAGNOSIS — M6281 Muscle weakness (generalized): Secondary | ICD-10-CM | POA: Diagnosis not present

## 2022-08-28 DIAGNOSIS — R2681 Unsteadiness on feet: Secondary | ICD-10-CM | POA: Diagnosis not present

## 2022-08-28 DIAGNOSIS — R29898 Other symptoms and signs involving the musculoskeletal system: Secondary | ICD-10-CM | POA: Diagnosis not present

## 2022-08-28 DIAGNOSIS — R278 Other lack of coordination: Secondary | ICD-10-CM | POA: Diagnosis not present

## 2022-08-28 DIAGNOSIS — Z9181 History of falling: Secondary | ICD-10-CM | POA: Diagnosis not present

## 2022-08-31 DIAGNOSIS — E1139 Type 2 diabetes mellitus with other diabetic ophthalmic complication: Secondary | ICD-10-CM | POA: Diagnosis not present

## 2022-08-31 DIAGNOSIS — J208 Acute bronchitis due to other specified organisms: Secondary | ICD-10-CM | POA: Diagnosis not present

## 2022-08-31 DIAGNOSIS — E034 Atrophy of thyroid (acquired): Secondary | ICD-10-CM | POA: Diagnosis not present

## 2022-08-31 DIAGNOSIS — E782 Mixed hyperlipidemia: Secondary | ICD-10-CM | POA: Diagnosis not present

## 2022-09-04 DIAGNOSIS — R278 Other lack of coordination: Secondary | ICD-10-CM | POA: Diagnosis not present

## 2022-09-04 DIAGNOSIS — R2681 Unsteadiness on feet: Secondary | ICD-10-CM | POA: Diagnosis not present

## 2022-09-04 DIAGNOSIS — R29898 Other symptoms and signs involving the musculoskeletal system: Secondary | ICD-10-CM | POA: Diagnosis not present

## 2022-09-04 DIAGNOSIS — M6281 Muscle weakness (generalized): Secondary | ICD-10-CM | POA: Diagnosis not present

## 2022-09-04 DIAGNOSIS — Z9181 History of falling: Secondary | ICD-10-CM | POA: Diagnosis not present

## 2022-09-13 DIAGNOSIS — H538 Other visual disturbances: Secondary | ICD-10-CM | POA: Diagnosis not present

## 2022-10-09 DIAGNOSIS — L821 Other seborrheic keratosis: Secondary | ICD-10-CM | POA: Diagnosis not present

## 2022-10-09 DIAGNOSIS — L578 Other skin changes due to chronic exposure to nonionizing radiation: Secondary | ICD-10-CM | POA: Diagnosis not present

## 2022-10-09 DIAGNOSIS — L570 Actinic keratosis: Secondary | ICD-10-CM | POA: Diagnosis not present

## 2022-12-31 DIAGNOSIS — E034 Atrophy of thyroid (acquired): Secondary | ICD-10-CM | POA: Diagnosis not present

## 2022-12-31 DIAGNOSIS — I10 Essential (primary) hypertension: Secondary | ICD-10-CM | POA: Diagnosis not present

## 2022-12-31 DIAGNOSIS — E782 Mixed hyperlipidemia: Secondary | ICD-10-CM | POA: Diagnosis not present

## 2022-12-31 DIAGNOSIS — E1139 Type 2 diabetes mellitus with other diabetic ophthalmic complication: Secondary | ICD-10-CM | POA: Diagnosis not present

## 2023-01-03 ENCOUNTER — Other Ambulatory Visit: Payer: Self-pay | Admitting: Cardiology

## 2023-01-19 ENCOUNTER — Other Ambulatory Visit: Payer: Self-pay | Admitting: Cardiology

## 2023-01-24 DIAGNOSIS — J45901 Unspecified asthma with (acute) exacerbation: Secondary | ICD-10-CM | POA: Diagnosis not present

## 2023-01-24 DIAGNOSIS — Z6823 Body mass index (BMI) 23.0-23.9, adult: Secondary | ICD-10-CM | POA: Diagnosis not present

## 2023-02-21 DIAGNOSIS — Z8673 Personal history of transient ischemic attack (TIA), and cerebral infarction without residual deficits: Secondary | ICD-10-CM | POA: Diagnosis not present

## 2023-02-21 DIAGNOSIS — R29898 Other symptoms and signs involving the musculoskeletal system: Secondary | ICD-10-CM | POA: Diagnosis not present

## 2023-02-21 DIAGNOSIS — Z6823 Body mass index (BMI) 23.0-23.9, adult: Secondary | ICD-10-CM | POA: Diagnosis not present

## 2023-02-21 DIAGNOSIS — R4182 Altered mental status, unspecified: Secondary | ICD-10-CM | POA: Diagnosis not present

## 2023-02-27 DIAGNOSIS — I6523 Occlusion and stenosis of bilateral carotid arteries: Secondary | ICD-10-CM | POA: Diagnosis not present

## 2023-02-27 DIAGNOSIS — R29898 Other symptoms and signs involving the musculoskeletal system: Secondary | ICD-10-CM | POA: Diagnosis not present

## 2023-02-27 DIAGNOSIS — Z8673 Personal history of transient ischemic attack (TIA), and cerebral infarction without residual deficits: Secondary | ICD-10-CM | POA: Diagnosis not present

## 2023-02-27 DIAGNOSIS — I6782 Cerebral ischemia: Secondary | ICD-10-CM | POA: Diagnosis not present

## 2023-02-27 DIAGNOSIS — I6521 Occlusion and stenosis of right carotid artery: Secondary | ICD-10-CM | POA: Diagnosis not present

## 2023-02-27 DIAGNOSIS — I6381 Other cerebral infarction due to occlusion or stenosis of small artery: Secondary | ICD-10-CM | POA: Diagnosis not present

## 2023-03-05 DIAGNOSIS — H35033 Hypertensive retinopathy, bilateral: Secondary | ICD-10-CM | POA: Diagnosis not present

## 2023-03-05 DIAGNOSIS — E119 Type 2 diabetes mellitus without complications: Secondary | ICD-10-CM | POA: Diagnosis not present

## 2023-03-05 DIAGNOSIS — H40013 Open angle with borderline findings, low risk, bilateral: Secondary | ICD-10-CM | POA: Diagnosis not present

## 2023-03-05 DIAGNOSIS — H538 Other visual disturbances: Secondary | ICD-10-CM | POA: Diagnosis not present

## 2023-03-12 DIAGNOSIS — I209 Angina pectoris, unspecified: Secondary | ICD-10-CM | POA: Diagnosis not present

## 2023-03-12 DIAGNOSIS — K219 Gastro-esophageal reflux disease without esophagitis: Secondary | ICD-10-CM | POA: Diagnosis not present

## 2023-03-21 ENCOUNTER — Encounter: Payer: Self-pay | Admitting: Neurology

## 2023-03-21 ENCOUNTER — Telehealth: Payer: Self-pay | Admitting: Neurology

## 2023-03-21 ENCOUNTER — Ambulatory Visit: Payer: Medicare Other | Admitting: Neurology

## 2023-03-21 VITALS — BP 133/52 | HR 63 | Ht 65.0 in | Wt 154.8 lb

## 2023-03-21 DIAGNOSIS — G459 Transient cerebral ischemic attack, unspecified: Secondary | ICD-10-CM | POA: Diagnosis not present

## 2023-03-21 DIAGNOSIS — G3184 Mild cognitive impairment, so stated: Secondary | ICD-10-CM

## 2023-03-21 DIAGNOSIS — R29898 Other symptoms and signs involving the musculoskeletal system: Secondary | ICD-10-CM | POA: Diagnosis not present

## 2023-03-21 MED ORDER — CLOPIDOGREL BISULFATE 75 MG PO TABS: 75.0000 mg | ORAL_TABLET | Freq: Every day | ORAL | 11 refills | Status: DC

## 2023-03-21 NOTE — Telephone Encounter (Signed)
MRI brain [facility] auth: [id number] exp. 03/21/23-04/19/23  MRA head/neck [employer] medicare auth: [id number] exp. 03/21/23-04/19/23 sent to GI [phone]

## 2023-03-21 NOTE — Patient Instructions (Signed)
I had a long d/w patient about his r recurrent episodes of right leg weakness and gait difficulties likely representing TIAs versus small strokes not visualized on CT scan, mild cognitive impairment, risk for recurrent stroke/TIAs, personally independently reviewed imaging studies and stroke evaluation results and answered questions.Start  clopidogrel 75 mg daily  for secondary stroke prevention as patient is aspirin allergic and maintain strict control of hypertension with blood pressure goal below 130/90, diabetes with hemoglobin A1c goal below 6.5% and lipids with LDL cholesterol goal below 70 mg/dL. I also advised the patient to eat a healthy diet with plenty of whole grains, cereals, fruits and vegetables, exercise regularly and maintain ideal body weight .  Check MRI scan of the brain and MRA of the brain and neck.  Also check memory panel labs, EEG.  I advised the patient to increase participation in cognitively challenging activities like solving crossword puzzles, playing bridge and sudoku.  We also discussed memory compensation strategies.  Followup in the future with me in 4 months or call earlier if necessary.  Stroke Prevention Some medical conditions and behaviors can lead to a higher chance of having a stroke. You can help prevent a stroke by eating healthy, exercising, not smoking, and managing any medical conditions you have. Stroke is a leading cause of functional impairment. Primary prevention is particularly important because a majority of strokes are first-time events. Stroke changes the lives of not only those who experience a stroke but also their family and other caregivers. How can this condition affect me? A stroke is a medical emergency and should be treated right away. A stroke can lead to brain damage and can sometimes be life-threatening. If a person gets medical treatment right away, there is a better chance of surviving and recovering from a stroke. What can increase my  risk? The following medical conditions may increase your risk of a stroke: Cardiovascular disease. High blood pressure (hypertension). Diabetes. High cholesterol. Sickle cell disease. Blood clotting disorders (hypercoagulable state). Obesity. Sleep disorders (obstructive sleep apnea). Other risk factors include: Being older than age 70. Having a history of blood clots, stroke, or mini-stroke (transient ischemic attack, TIA). Genetic factors, such as race, ethnicity, or a family history of stroke. Smoking cigarettes or using other tobacco products. Taking birth control pills, especially if you also use tobacco. Heavy use of alcohol or drugs, especially cocaine and methamphetamine. Physical inactivity. What actions can I take to prevent this? Manage your health conditions High cholesterol levels. Eating a healthy diet is important for preventing high cholesterol. If cholesterol cannot be managed through diet alone, you may need to take medicines. Take any prescribed medicines to control your cholesterol as told by your health care provider. Hypertension. To reduce your risk of stroke, try to keep your blood pressure below 130/80. Eating a healthy diet and exercising regularly are important for controlling blood pressure. If these steps are not enough to manage your blood pressure, you may need to take medicines. Take any prescribed medicines to control hypertension as told by your health care provider. Ask your health care provider if you should monitor your blood pressure at home. Have your blood pressure checked every year, even if your blood pressure is normal. Blood pressure increases with age and some medical conditions. Diabetes. Eating a healthy diet and exercising regularly are important parts of managing your blood sugar (glucose). If your blood sugar cannot be managed through diet and exercise, you may need to take medicines. Take any prescribed medicines to  control your  diabetes as told by your health care provider. Get evaluated for obstructive sleep apnea. Talk to your health care provider about getting a sleep evaluation if you snore a lot or have excessive sleepiness. Make sure that any other medical conditions you have, such as atrial fibrillation or atherosclerosis, are managed. Nutrition Follow instructions from your health care provider about what to eat or drink to help manage your health condition. These instructions may include: Reducing your daily calorie intake. Limiting how much salt (sodium) you use to 1,500 milligrams (mg) each day. Using only healthy fats for cooking, such as olive oil, canola oil, or sunflower oil. Eating healthy foods. You can do this by: Choosing foods that are high in fiber, such as whole grains, and fresh fruits and vegetables. Eating at least 5 servings of fruits and vegetables a day. Try to fill one-half of your plate with fruits and vegetables at each meal. Choosing lean protein foods, such as lean cuts of meat, poultry without skin, fish, tofu, beans, and nuts. Eating low-fat dairy products. Avoiding foods that are high in sodium. This can help lower blood pressure. Avoiding foods that have saturated fat, trans fat, and cholesterol. This can help prevent high cholesterol. Avoiding processed and prepared foods. Counting your daily carbohydrate intake.  Lifestyle If you drink alcohol: Limit how much you have to: 0-1 drink a day for women who are not pregnant. 0-2 drinks a day for men. Know how much alcohol is in your drink. In the [place] one drink equals one 12 oz bottle of beer ( ), one 5 oz glass of wine ( ), or one 1 oz glass of hard liquor (44mL). Do not use any products that contain nicotine or tobacco. These products include cigarettes, chewing tobacco, and vaping devices, such as e-cigarettes. If you need help quitting, ask your health care provider. Avoid secondhand smoke. Do not use  drugs. Activity  Try to stay at a healthy weight. Get at least 30 minutes of exercise on most days, such as: Fast walking. Biking. Swimming. Medicines Take over-the-counter and prescription medicines only as told by your health care provider. Aspirin or blood thinners (antiplatelets or anticoagulants) may be recommended to reduce your risk of forming blood clots that can lead to stroke. Avoid taking birth control pills. Talk to your health care provider about the risks of taking birth control pills if: You are over 37 years old. You smoke. You get very bad headaches. You have had a blood clot. Where to find more information American Stroke Association: www.strokeassociation.org Get help right away if: You or a loved one has any symptoms of a stroke. "BE FAST" is an easy way to remember the main warning signs of a stroke: B - Balance. Signs are dizziness, sudden trouble walking, or loss of balance. E - Eyes. Signs are trouble seeing or a sudden change in vision. F - Face. Signs are sudden weakness or numbness of the face, or the face or eyelid drooping on one side. A - Arms. Signs are weakness or numbness in an arm. This happens suddenly and usually on one side of the body. S - Speech. Signs are sudden trouble speaking, slurred speech, or trouble understanding what people say. T - Time. Time to call emergency services. Write down what time symptoms started. You or a loved one has other signs of a stroke, such as: A sudden, severe headache with no known cause. Nausea or vomiting. Seizure. These symptoms may represent a serious problem that is  an emergency. Do not wait to see if the symptoms will go away. Get medical help right away. Call your local emergency services (911 in the [place] Do not drive yourself to the hospital. Summary You can help to prevent a stroke by eating healthy, exercising, not smoking, limiting alcohol intake, and managing any medical conditions you may have. Do  not use any products that contain nicotine or tobacco. These include cigarettes, chewing tobacco, and vaping devices, such as e-cigarettes. If you need help quitting, ask your health care provider. Remember "BE FAST" for warning signs of a stroke. Get help right away if you or a loved one has any of these signs. This information is not intended to replace advice given to you by your health care provider. Make sure you discuss any questions you have with your health care provider. Document Revised: 07/16/2022 Document Reviewed: 07/16/2022 Elsevier Patient Education  2024 Elsevier Inc.  Memory Compensation Strategies  Use "WARM" strategy.  W= write it down  A= associate it  R= repeat it  M= make a mental note  2.   You can keep a Glass blower/designer.  Use a 3-ring notebook with sections for the following: calendar, important names and phone numbers,  medications, doctors' names/phone numbers, lists/reminders, and a section to journal what you did  each day.   3.    Use a calendar to write appointments down.  4.    Write yourself a schedule for the day.  This can be placed on the calendar or in a separate section of the Memory Notebook.  Keeping a  regular schedule can help memory.  5.    Use medication organizer with sections for each day or morning/evening pills.  You may need help loading it  6.    Keep a basket, or pegboard by the door.  Place items that you need to take out with you in the basket or on the pegboard.  You may also want to  include a message board for reminders.  7.    Use sticky notes.  Place sticky notes with reminders in a place where the task is performed.  For example: " turn off the  stove" placed by the stove, "lock the door" placed on the door at eye level, " take your medications" on  the bathroom mirror or by the place where you normally take your medications.  8.    Use alarms/timers.  Use while cooking to remind yourself to check on food or as a reminder to  take your medicine, or as a  reminder to make a call, or as a reminder to perform another task, etc.

## 2023-03-21 NOTE — Progress Notes (Signed)
[facility] [address] [place]. [place] [address] [phone]       OFFICE CONSULT NOTE  [name] Date of Birth:  01-27-1941 Medical Record Number:  [id number]   Referring MD: Gus Height, PA-C  Reason for Referral: Strokes  HPI: Bruce Nunez is a pleasant 82 year old Caucasian male seen today for initial office consultation visit for stroke.  History is obtained from the patient and review of referral notes.  I personally reviewed available records and electronic medical records and imaging films in PACS.  He has past medical history of diabetes, hypertension, hyperlipidemia, gastroesophageal reflux disease.  Patient states he has had 2 episodes of possible strokes.  The first 1 was on February 08, 2022 he had driven to [facility], [place] for a  for religious conference.  Is reached that he has noticed that he was mainly leaning to 1 side.  He pulled his car over to [employer] parking lot.  As he got up he noticed he was drifting to the right.  His right leg felt heavy and was twisting when he was walking.  He was able to hold onto the side of the car.  The symptoms lasted for about an hour or so.  Eventually improved and he was able to drive to his motel.  He skipped the conference.  He eventually got medical help only after he came back to [place] a few days later.  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  Patient states he did quite well in the interim.  On 02/14/2023 he had a somewhat similar episode while he was driving he felt that his car kept on being pulled to the right and he had to correct it.  After doing this for 3-4 times he eventually pulled over and he checked his tire when he got home but it was not flat.  He again noticed that his right leg was weak and acting funny could not tested.  He denied any fall, injury, back pain, radicular pain.  Patient also admitted to  his having some memory difficulties and confusion during these episodes.  He had a repeat CT scan of the head on 02/27/2023 which again showed mild changes of atrophy and small vessel disease.  No acute abnormality.  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  He has not had any recent lab work for lipid profile and hemoglobin A1c checked.  He is allergic to aspirin and develops hives.  He is currently not on any antiplatelet agents.  He is independent in activities of daily living.  He lives with his wife.  He does admit to mild short-term memory difficulties particularly for recent events he finds it difficult to remember.  He denies any known prior history of strokes, seizures, TIA or significant neurological's.  ROS:   14 system review of systems is positive for difficulty walking, imbalance, leg weakness, memory loss, vision difficulty, leg pain all other systems negative  PMH:  Past Medical History:  Diagnosis Date   Anemia    recent iron supplement   Asthma    childhood-   2013 bronchitis w/wheezing   Complication of anesthesia    " ether made me sick" - "woke up during colonoscopy"   Diabetes mellitus without complication (  HCC)    Frequency of urination    GERD (gastroesophageal reflux disease)    occasional   Heart murmur    Hemorrhoids    History of kidney stones    Hx of colon cancer, stage I    Hx of nonmelanoma skin cancer    Hx of peptic ulcer    as teenager   Hx of transfusion    Hyperlipidemia    Hypertension    Inguinal hernia    PONV (postoperative nausea and vomiting)    only when had Ether as Anesthesia drug   Prostate cancer (HCC) 08/15/12   also Hx colon cancer / Hx skin cancer   Right inguinal hernia 05/08/2016   S/P right inguinal hernia repair Sept 2015 05/17/2014   Recurrent right indirect  Hernia treated with robotic TAPP repair  with enterolysis Sept 2017    Social History:  Social History   Socioeconomic History   Marital status: Married    Spouse name: Not on file   Number of children: 2   Years of education: Not on file   Highest education level: Not on file  Occupational History   Occupation: Retired    Comment: Designer, fashion/clothing  Tobacco Use   Smoking status: Never    Passive exposure: Never   Smokeless tobacco: Never  Vaping Use   Vaping status: Never Used  Substance and Sexual Activity   Alcohol use: No   Drug use: No   Sexual activity: Not on file  Other Topics Concern   Not on file  Social History Narrative   Not on file   Social Determinants of Health   Financial Resource Strain: Not on file  Food Insecurity: Not on file  Transportation Needs: Not on file  Physical Activity: Not on file  Stress: Not on file  Social Connections: Not on file  Intimate Partner Violence: Not on file    Medications:   Current Outpatient Medications on File Prior to Visit  Medication Sig Dispense Refill   albuterol (PROVENTIL HFA;VENTOLIN HFA) 108 (90 BASE) MCG/ACT inhaler Inhale 2 puffs into the lungs every 6 (six) hours as needed for wheezing.     amLODipine (NORVASC) 5 MG tablet Take 1 tablet (5 mg total) by mouth every other day. 90 tablet 3   bisoprolol-hydrochlorothiazide (ZIAC) 10-6.25 MG per tablet Take 1 tablet by mouth every morning.      cyanocobalamin (,VITAMIN B-12,) 1000 MCG/ML injection Inject 1 mL into the skin every 30 (thirty) days.     fesoterodine (TOVIAZ) 4 MG TB24 tablet Take 4 mg by mouth daily.     fexofenadine (ALLEGRA) 180 MG tablet Take 180 mg by mouth daily.     fluticasone (FLONASE) 50 MCG/ACT nasal spray Place 1 spray into both nostrils daily.      fluticasone-salmeterol (ADVAIR HFA) 115-21 MCG/ACT inhaler Inhale 2 puffs into the lungs 2 (two) times daily.     glimepiride (AMARYL) 2 MG tablet Take 2 mg by mouth daily.     isosorbide mononitrate (IMDUR) 30 MG 24 hr tablet Take 1  tablet (30 mg total) by mouth daily. 90 tablet 1   levothyroxine (SYNTHROID) 25 MCG tablet Take 1 tablet by mouth 3 (three) times a week.     Loperamide HCl (IMODIUM A-D PO) Take 1 capsule by mouth as needed (Diarrhea).     losartan (COZAAR) 100 MG tablet Take 100 mg by mouth every other day.      nitroGLYCERIN (NITROSTAT) 0.4 MG SL tablet DISSOLVE 1  TABLET UNDER THE TONGUE EVERY 5 MINUTES AS NEEDED FOR CHEST PAIN. DO NOT EXCEED A TOTAL OF 3 DOSES IN 15 MINUTES. 25 tablet 3   omeprazole (PRILOSEC) 20 MG capsule Take 20 mg by mouth daily.     rosuvastatin (CRESTOR) 20 MG tablet Take 1 tablet (20 mg total) by mouth daily. 90 tablet 1   solifenacin (VESICARE) 10 MG tablet Take 10 mg by mouth daily.     Tamsulosin HCl (FLOMAX) 0.4 MG CAPS Take 0.4 mg by mouth 2 (two) times daily.      telmisartan (MICARDIS) 20 MG tablet Take 1 tablet (20 mg total) by mouth daily. 90 tablet 2   dicyclomine (BENTYL) 10 MG capsule Take 10 mg by mouth 2 (two) times daily as needed for cramping. (Patient not taking: Reported on 03/21/2023)     Probiotic Product (PROBIOTIC DAILY PO) Take 1 capsule by mouth daily. (Patient not taking: Reported on 03/21/2023)     No current facility-administered medications on file prior to visit.    Allergies:   Allergies  Allergen Reactions   Penicillins Anaphylaxis, Shortness Of Breath, Itching and Swelling    Has patient had a PCN reaction causing immediate rash, facial/tongue/throat swelling, SOB or lightheadedness with hypotension: Yes Has patient had a PCN reaction causing severe rash involving mucus membranes or skin necrosis: No Has patient had a PCN reaction that required hospitalization No Has patient had a PCN reaction occurring within the last 10 years: No If all of the above answers are "NO", then may proceed with Cephalosporin use.    Aspirin Other (See Comments)    GI bleeding-avoids    Monodox [Doxycycline Hyclate] Nausea Only    "made me feel sick"   Sulfa  Antibiotics Itching and Swelling   Tramadol Nausea And Vomiting    Physical Exam General: well developed, well nourished pleasant elderly Caucasian male, seated, in no evident distress Head: head normocephalic and atraumatic.   Neck: supple with no carotid or supraclavicular bruits Cardiovascular: regular rate and rhythm, harsh ejection systolic murmur heard throughout precordium and in both neck Musculoskeletal: no deformity Skin:  no rash/petichiae Vascular:  Normal pulses all extremities  Neurologic Exam Mental Status: Awake and fully alert. Oriented to place and time. Recent and remote memory intact. Attention span, concentration and fund of knowledge appropriate. Mood and affect appropriate.  Diminished recall 1/3.  Able to name 12 animals which can walk on 4 legs.  Clock drawing 4/4. Cranial Nerves: Fundoscopic exam reveals sharp disc margins. Pupils equal, briskly reactive to light. Extraocular movements full without nystagmus. Visual fields full to confrontation. Hearing intact. Facial sensation intact. Face, tongue, palate moves normally and symmetrically.  Motor: Normal bulk and tone. Normal strength in all tested extremity muscles. Sensory.: intact to touch , pinprick , position and vibratory sensation.  Coordination: Rapid alternating movements normal in all extremities. Finger-to-nose and heel-to-shin performed accurately bilaterally. Gait and Station: Arises from chair without difficulty. Stance is normal. Gait demonstrates normal stride length and balance . Able to heel, toe and tandem walk with great difficulty.  Reflexes: 1+ and symmetric. Toes downgoing.   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  Vascular risk factors of hypertension, hyperlipidemia and age.  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   PLAN: I had a long d/w patient about his r recurrent episodes of right leg weakness and gait difficulties likely representing TIAs versus small strokes not visualized on CT scan, mild cognitive impairment, risk for recurrent stroke/TIAs, personally independently reviewed imaging studies and stroke evaluation results and answered questions.Start  clopidogrel 75 mg daily  for secondary stroke prevention as patient is aspirin allergic and maintain strict control of hypertension with blood pressure goal below 130/90, diabetes with hemoglobin A1c goal below 6.5% and lipids with LDL cholesterol goal below 70 mg/dL. I also advised the patient to eat a healthy diet with plenty of whole grains, cereals, fruits and vegetables, exercise regularly and maintain ideal body weight .  Check MRI scan of the brain and MRA of the brain and neck.  Also check memory panel labs, EEG.  I advised the patient to increase participation in cognitively challenging activities like solving crossword puzzles, playing bridge and sudoku.  We also discussed memory compensation strategies.  Followup in the future with me in 4 months or call earlier if necessary.  Greater than 50% time during this 60-minute consultation visit was spent on counseling and coordination of care about his TIA versus small strokes and mild cognitive impairment and answering questions. Delia Heady, MD  Note: This document was prepared with digital dictation and possible smart phrase technology. Any transcriptional errors that result from this process are unintentional.

## 2023-03-24 NOTE — Progress Notes (Signed)
Kindly inform the patient that cholesterol profile is all quite satisfactory.  Screening test for diabetes is borderline in to see primary care physician for advice on managing this Lab work for reversible causes of memory loss is also normal

## 2023-03-25 ENCOUNTER — Telehealth: Payer: Self-pay | Admitting: Anesthesiology

## 2023-03-25 NOTE — Telephone Encounter (Signed)
Pt has been informed of lab results. Per Dr Pearlean Brownie: cholesterol profile is all quite satisfactory.  Screening test for diabetes is borderline in to see primary care physician for advice on managing this Lab work for reversible causes of memory loss is also normal. Pt verbalized understanding. Pt had no additional questions at this time but was encouraged to call back if questions arise.

## 2023-03-25 NOTE — Telephone Encounter (Signed)
-----   Message from Delia Heady sent at 03/24/2023  4:37 PM EDT ----- Joneen Roach inform the patient that cholesterol profile is all quite satisfactory.  Screening test for diabetes is borderline in to see primary care physician for advice on managing this Lab work for reversible causes of memory loss is also normal

## 2023-03-26 ENCOUNTER — Ambulatory Visit: Payer: Medicare Other | Admitting: Neurology

## 2023-03-26 DIAGNOSIS — G3184 Mild cognitive impairment, so stated: Secondary | ICD-10-CM

## 2023-03-26 DIAGNOSIS — R4182 Altered mental status, unspecified: Secondary | ICD-10-CM

## 2023-04-01 DIAGNOSIS — E782 Mixed hyperlipidemia: Secondary | ICD-10-CM | POA: Diagnosis not present

## 2023-04-01 DIAGNOSIS — E1139 Type 2 diabetes mellitus with other diabetic ophthalmic complication: Secondary | ICD-10-CM | POA: Diagnosis not present

## 2023-04-01 DIAGNOSIS — I10 Essential (primary) hypertension: Secondary | ICD-10-CM | POA: Diagnosis not present

## 2023-04-01 DIAGNOSIS — E034 Atrophy of thyroid (acquired): Secondary | ICD-10-CM | POA: Diagnosis not present

## 2023-04-03 ENCOUNTER — Telehealth: Payer: Self-pay

## 2023-04-03 ENCOUNTER — Other Ambulatory Visit: Payer: Medicare Other | Admitting: *Deleted

## 2023-04-03 NOTE — Telephone Encounter (Signed)
-----   Message from Delia Heady sent at 04/03/2023  1:51 PM EDT ----- Joneen Roach inform the patient that EEG of brainwave study shows mild slowing of brain activity which is seen in a variety of conditions including old age and memory loss.  No definite seizure activity noted

## 2023-04-03 NOTE — Progress Notes (Signed)
Kindly inform the patient that EEG of brainwave study shows mild slowing of brain activity which is seen in a variety of conditions including old age and memory loss.  No definite seizure activity noted

## 2023-04-03 NOTE — Telephone Encounter (Signed)
Called patient to informed him per Dr Pearlean Brownie "Kindly inform the patient that EEG of brainwave study shows mild slowing of brain activity which is seen in a variety of conditions including old age and memory loss.  No definite seizure activity noted ." Pt verbalized understanding. Pt had no questions at this time but was encouraged to call back if questions arise.

## 2023-04-09 DIAGNOSIS — L578 Other skin changes due to chronic exposure to nonionizing radiation: Secondary | ICD-10-CM | POA: Diagnosis not present

## 2023-04-09 DIAGNOSIS — L821 Other seborrheic keratosis: Secondary | ICD-10-CM | POA: Diagnosis not present

## 2023-04-09 DIAGNOSIS — L570 Actinic keratosis: Secondary | ICD-10-CM | POA: Diagnosis not present

## 2023-04-11 NOTE — Progress Notes (Signed)
Cardiology Office Note:    Date:  04/12/2023   ID:  Bruce Nunez, DOB [birth year], MRN [id number]  PCP:  Gus Height, PA  Cardiologist:  Norman Herrlich, MD    Referring MD: Gus Height, [name]    ASSESSMENT:    1. Nonrheumatic aortic valve insufficiency   2. Nonrheumatic aortic valve stenosis   3. Essential hypertension   4. Mixed hyperlipidemia   5. Chest pain of uncertain etiology    PLAN:    In order of problems listed above:  His cardiac MRI and echocardiogram are consistent with moderate aortic regurgitation his physical exam with wide pulse pressure and symptoms are more characteristic of symptomatic aortic regurgitation. I think he would benefit from diagnostic left and right heart catheterization I am concerned with his diastolic pressure of 40 I am going to have him stop taking amlodipine.  He will continue his other antihypertensives ARB beta-blocker thiazide diuretic continue to trend and monitor blood pressure at home Continue with statin lipid profile 03/21/2023 cholesterol 95 LDL 39 hemoglobin 14.6 creatinine 1.1 potassium 4.7 He is having accelerated pattern of typical angina he has had previous normal perfusion study I think he would benefit from either cardiac CTA or diagnostic left and right heart catheterization.  Both Bruce Nunez and his wife prefer to start with cardiac CTA especially at this pending MRI for neurologic evaluation. Check proBNP with a BMP prior to CT contrast   Next appointment: 6 weeks   Medication Adjustments/Labs and Tests Ordered: Current medicines are reviewed at length with the patient today.  Concerns regarding medicines are outlined above.  Orders Placed This Encounter  Procedures   EKG 12-Lead   No orders of the defined types were placed in this encounter.    History of Present Illness:    Bruce Nunez is a 82 y.o. male with a hx of mixed aortic regurgitation aortic stenosis hypertension hyperlipidemia and type 2 diabetes last  seen 06/28/2022.  Following the visit 07/11/2022 echocardiogram was performed showing ejection fraction 60 to 65% grade 1 diastolic filling pattern right ventricle normal size function with normal pulmonary artery pressure.  He had aortic regurgitation present that was described as moderate to severe with mild to moderate aortic stenosis mean gradient 14 mmHg.  Pressure half-time was 314 more consistent with moderate aortic regurgitation valve area the aortic valve 1.1 cm with a mean gradient of 14 mmHg most consistent with mild aortic stenosis. In July 2023 he had a cardiac MRI performed showing normal biventricular size and form moderate aortic regurgitation with regurgitant fraction 37%.  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 Compliance with diet, lifestyle and medications: Yes Past Medical History:  Diagnosis Date   Anemia    recent iron supplement   Asthma    childhood-   2013 bronchitis w/wheezing   Complication of anesthesia    " ether made me sick" - "woke up during colonoscopy"   Diabetes mellitus without complication (HCC)    Frequency of  urination    GERD (gastroesophageal reflux disease)    occasional   Heart murmur    Hemorrhoids    History of kidney stones    Hx of colon cancer, stage I    Hx of nonmelanoma skin cancer    Hx of peptic ulcer    as teenager   Hx of transfusion    Hyperlipidemia    Hypertension    Inguinal hernia    PONV (postoperative nausea and vomiting)    only when had Ether as Anesthesia  drug   Prostate cancer (HCC) 08/15/12   also Hx colon cancer / Hx skin cancer   Right inguinal hernia 05/08/2016   S/P right inguinal hernia repair Sept 2015 05/17/2014   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   amLODipine (NORVASC) 10 MG tablet Take 10 mg by mouth every other day.   bisoprolol-hydrochlorothiazide (ZIAC) 10-6.25 MG per tablet Take 1 tablet by mouth every morning.    budesonide-formoterol (SYMBICORT) 160-4.5 MCG/ACT inhaler Inhale 2 puffs into the lungs 2 (two) times daily.   clopidogrel (PLAVIX) 75 MG tablet Take 1 tablet (75 mg total) by mouth daily.   cyanocobalamin (,VITAMIN B-12,) 1000 MCG/ML injection Inject 1 mL into the skin every 30 (thirty) days.   dicyclomine (BENTYL) 10 MG capsule Take 10 mg by mouth 2 (two) times daily as needed for cramping.   fesoterodine (TOVIAZ) 4 MG TB24 tablet Take 4 mg by mouth daily.   fexofenadine (ALLEGRA) 180 MG tablet Take 180 mg by mouth daily.   fluticasone (FLONASE) 50 MCG/ACT nasal spray Place 1 spray into both nostrils daily.    fluticasone-salmeterol (ADVAIR HFA) 115-21 MCG/ACT inhaler Inhale 2 puffs into the lungs 2 (two) times daily.   furosemide (LASIX) 20 MG tablet Take 10-20 mg by mouth daily as needed for fluid or edema.   glimepiride (AMARYL) 2 MG tablet Take 2 mg by mouth daily.   isosorbide mononitrate (IMDUR) 30 MG 24 hr tablet Take 1 tablet (30 mg total) by mouth daily.   levothyroxine (SYNTHROID) 25 MCG tablet Take 1 tablet by mouth 3 (three) times a week.   Loperamide HCl (IMODIUM A-D PO) Take 1 capsule by mouth as needed (Diarrhea).   losartan (COZAAR) 100 MG tablet Take 100 mg by mouth every other day.    nitroGLYCERIN (NITROSTAT) 0.4 MG SL tablet DISSOLVE 1 TABLET  UNDER THE TONGUE EVERY 5 MINUTES AS NEEDED FOR CHEST PAIN. DO NOT EXCEED A TOTAL OF 3 DOSES IN 15 MINUTES.   omeprazole (PRILOSEC) 20 MG capsule Take 20 mg by mouth daily.   Probiotic Product (PROBIOTIC DAILY PO) Take 1 capsule by mouth daily.   rosuvastatin (CRESTOR) 20 MG tablet Take 1 tablet (20 mg total) by mouth daily.   solifenacin (VESICARE) 10 MG tablet Take 10 mg by mouth daily.   Tamsulosin HCl (FLOMAX) 0.4 MG CAPS Take 0.4 mg by mouth 2 (two) times daily.    telmisartan (MICARDIS) 20 MG tablet Take 1 tablet (20 mg total) by mouth daily.   [DISCONTINUED] amLODipine (NORVASC) 5 MG tablet Take 1 tablet (5 mg total) by mouth every other day.      EKGs/Labs/Other Studies Reviewed:    The following studies were reviewed today:  Cardiac Studies &  Procedures       ECHOCARDIOGRAM  ECHOCARDIOGRAM COMPLETE 07/11/2022  Narrative ECHOCARDIOGRAM REPORT    Patient Name:   Bruce Nunez Date of Exam: 07/11/2022 Medical Rec #:  [id number]     Height:       67.0 in Accession #:    [id number]    Weight:       151.2 lb Date of Birth:  06/07/1941      BSA:          1.796 m Patient Age:    81 years      BP:           146/54 mmHg Patient Gender: M             HR:           63 bpm. Exam Location:  Standard  Procedure: 2D Echo, Cardiac Doppler, Color Doppler and Strain Analysis  Indications:    Nonrheumatic aortic valve stenosis [I35.0 (ICD-10-CM)]; Nonrheumatic aortic valve insufficiency [I35.1 (ICD-10-CM)]  History:        Patient has prior history of Echocardiogram examinations, most recent 05/30/2020. Risk Factors:Hypertension and Dyslipidemia.  Sonographer:    Louie Boston RDCS Referring Phys: [id number] Jujuan Dugo J Abdulkareem Badolato  IMPRESSIONS   1. GLS -12.9. Left ventricular ejection fraction, by estimation, is 60 to 65%. The left ventricle has normal function. The left ventricle has no regional wall motion abnormalities. Left ventricular diastolic parameters are consistent with Grade  I diastolic dysfunction (impaired relaxation). 2. Right ventricular systolic function is normal. The right ventricular size is normal. There is normal pulmonary artery systolic pressure. 3. The mitral valve is normal in structure. No evidence of mitral valve regurgitation. No evidence of mitral stenosis. 4. EF still 60-65%, normal LV size. The aortic valve is calcified. There is moderate calcification of the aortic valve. There is mild thickening of the aortic valve. Aortic valve regurgitation is moderate to severe. Mild to moderate aortic valve stenosis. Aortic valve mean gradient measures 14.0 mmHg. 5. The inferior vena cava is normal in size with greater than 50% respiratory variability, suggesting right atrial pressure of 3 mmHg.  FINDINGS Left Ventricle: GLS -12.9. Left ventricular ejection fraction, by estimation, is 60 to 65%. The left ventricle has normal function. The left ventricle has no regional wall motion abnormalities. The left ventricular internal cavity size was normal in size. There is no left ventricular hypertrophy. Left ventricular diastolic parameters are consistent with Grade I diastolic dysfunction (impaired relaxation).  Right Ventricle: The right ventricular size is normal. No increase in right ventricular wall thickness. Right ventricular systolic function is normal. There is normal pulmonary artery systolic pressure. The tricuspid regurgitant velocity is 2.55 m/s, and with an assumed right atrial pressure of 3 mmHg, the estimated right ventricular systolic pressure is 29.0 mmHg.  Left Atrium: Left atrial size was normal in size.  Right Atrium: Right atrial size was normal in size.  Pericardium: There is no evidence of pericardial effusion.  Mitral Valve: The mitral valve is normal in structure. No evidence of mitral valve regurgitation. No evidence of mitral valve stenosis.  Tricuspid Valve: The tricuspid valve is normal in structure. Tricuspid valve regurgitation  is mild . No evidence of tricuspid stenosis.  Aortic Valve: EF still 60-65%, normal LV size. The aortic valve is calcified. There is moderate calcification of the aortic valve. There is mild thickening of the aortic valve. Aortic valve regurgitation is moderate to severe. Aortic regurgitation PHT measures 314 msec. Mild to moderate aortic  stenosis is present. Aortic valve mean gradient measures 14.0 mmHg. Aortic valve peak gradient measures 26.1 mmHg. Aortic valve area, by VTI measures 1.11 cm.  Pulmonic Valve: The pulmonic valve was normal in structure. Pulmonic valve regurgitation is not visualized. No evidence of pulmonic stenosis.  Aorta: The aortic root is normal in size and structure.  Venous: The inferior vena cava is normal in size with greater than 50% respiratory variability, suggesting right atrial pressure of 3 mmHg.  IAS/Shunts: No atrial level shunt detected by color flow Doppler.   LEFT VENTRICLE PLAX 2D LVIDd:         4.30 cm   Diastology LVIDs:         2.50 cm   LV e' medial:    4.03 cm/s LV PW:         1.00 cm   LV E/e' medial:  19.0 LV IVS:        1.40 cm   LV e' lateral:   7.29 cm/s LVOT diam:     1.80 cm   LV E/e' lateral: 10.5 LV SV:         58 LV SV Index:   32 LVOT Area:     2.54 cm   RIGHT VENTRICLE             IVC RV S prime:     10.30 cm/s  IVC diam: 1.20 cm TAPSE (M-mode): 2.2 cm  LEFT ATRIUM             Index        RIGHT ATRIUM          Index LA diam:        3.50 cm 1.95 cm/m   RA Area:     9.45 cm LA Vol (A2C):   48.2 ml 26.84 ml/m  RA Volume:   17.10 ml 9.52 ml/m LA Vol (A4C):   54.0 ml 30.07 ml/m LA Biplane Vol: 51.6 ml 28.74 ml/m AORTIC VALVE AV Area (Vmax):    0.99 cm AV Area (Vmean):   1.01 cm AV Area (VTI):     1.11 cm AV Vmax:           255.50 cm/s AV Vmean:          168.500 cm/s AV VTI:            0.525 m AV Peak Grad:      26.1 mmHg AV Mean Grad:      14.0 mmHg LVOT Vmax:         99.80 cm/s LVOT Vmean:        66.800  cm/s LVOT VTI:          0.228 m LVOT/AV VTI ratio: 0.43 AI PHT:            314 msec  AORTA Ao Root diam: 2.30 cm Ao Asc diam:  3.00 cm  MITRAL VALVE               TRICUSPID VALVE MV Area (PHT): 3.74 cm    TR Peak grad:   26.0 mmHg MV Decel Time: 203 msec    TR Vmax:        255.00 cm/s MV E velocity: 76.70 cm/s MV A velocity: 83.60 cm/s  SHUNTS MV E/A ratio:  0.92        Systemic VTI:  0.23 m Systemic Diam: 1.80 cm  Gypsy Balsam MD Electronically signed by Gypsy Balsam MD Signature Date/Time: 07/11/2022/12:25:32 PM    Final  MONITORS  LONG TERM MONITOR (3-14 DAYS) 04/23/2022  Narrative Patch Wear Time:  13 days and 22 hours (2023-08-02T11:41:54-0400 to 2023-08-16T10:11:23-0400)  Patient had a min HR of 52 bpm, max HR of 122 bpm, and avg HR of 68 bpm. Predominant underlying rhythm was Sinus Rhythm.  There were no sinus pauses of 3 seconds or greater and no episodes of second or third-degree AV nodal block.  There were no triggered or diary events.  4 Supraventricular Tachycardia runs occurred, the run with the fastest interval lasting 4 beats with a max rate of 122 bpm, the longest lasting 6 beats with an avg rate of 94 bpm. Isolated SVEs were rare (<1.0%), SVE Couplets were rare (<1.0%), and SVE Triplets were rare (<1.0%).  Isolated VEs were rare (<1.0%), and no VE Couplets or VE Triplets were present. Ventricular Trigeminy was present.    CARDIAC MRI  MR CARDIAC MORPHOLOGY W WO CONTRAST 03/21/2022  Narrative CLINICAL DATA:  Evaluate aortic regurgitation  EXAM: CARDIAC MRI  TECHNIQUE: The patient was scanned on a 1.5 Tesla Siemens magnet. A dedicated cardiac coil was used. Functional imaging was done using Fiesta sequences. 2,3, and 4 chamber views were done to assess for RWMA's. Modified Simpson's rule using a short axis stack was used to calculate an ejection fraction on a dedicated work [employer]. The patient received 10 cc of  Gadavist. After 10 minutes inversion recovery sequences were used to assess for infiltration and scar tissue. Phase contrast velocity mapping performed.  CONTRAST:  10 cc  of Gadavist  FINDINGS: 1. Normal left ventricular size, thickness and systolic function (LVEF = 54%). There are no regional wall motion abnormalities.  There is no late gadolinium enhancement in the left ventricular myocardium.  LVEDV: 123 ml  LVESV: 57 ml  SV: 66 ml  Myocardial mass: 86 g  2. Normal right ventricular size, thickness and systolic function (RVEF = 63%). There are no regional wall motion abnormalities.  3.  Normal left and right atrial size.  4. Normal size of the aortic root, ascending aorta and pulmonary artery.  5. Moderate aortic regurgitation. Aortic valve measurements: regurgitant fraction = 37%, regurgitant volume = 24 ml  Mild tricuspid regurgitation.  6.  Normal pericardium.  No pericardial effusion.  IMPRESSION: 1.  Normal Biventricular size and function.  LVEF 54%, RVEF 63%.  2.  Moderate aortic valve regurgitation.  Regurgitant fraction 37%.  3.  No evidence of late gadolinium enhancement or infiltration.  4.  Mild tricuspid regurgitation.  Debbe Odea   Electronically Signed By: Debbe Odea M.D. On: 03/26/2022 17:40         EKG Interpretation Date/Time:  Friday April 12 2023 12:56:31 EDT Ventricular Rate:  55 PR Interval:  206 QRS Duration:  80 QT Interval:  452 QTC Calculation: 432 R Axis:   -6  Text Interpretation: Sinus bradycardia Left ventricular hypertrophy Abnormal R wave progression When compared with ECG of 23-Oct-2012 12:55, Criteria for Inferior infarct are no longer Present Confirmed by Norman Herrlich ([id number]) on 04/12/2023 1:05:02 PM   Recent Labs: 03/21/2023: TSH 2.620  Recent Lipid Panel    Component Value Date/Time   CHOL 95 (L) 03/21/2023 1059   TRIG 137 03/21/2023 1059   HDL 32 (L) 03/21/2023 1059   CHOLHDL 3.0  03/21/2023 1059   LDLCALC 39 03/21/2023 1059    Physical Exam:    VS:  BP 120/60   Pulse (!) 55   Ht 5\' 6"  (1.676 m)   Wt  153 lb 3.2 oz (69.5 kg)   SpO2 97%   BMI 24.73 kg/m     Wt Readings from Last 3 Encounters:  04/12/23 153 lb 3.2 oz (69.5 kg)  03/21/23 154 lb 12.8 oz (70.2 kg)  06/28/22 151 lb 3.2 oz (68.6 kg)    My blood pressure check [place] is 130/40 left upper extremity setting GEN:  Well nourished, well developed in no acute distress HEENT: Normal NECK: No JVD; No carotid bruits LYMPHATICS: No lymphadenopathy CARDIAC: Grade 2/6 to 3/6 murmur aortic outflow or aortic regurgitation the right aortic area no S3 no pistol shots in the carotid RRR, no murmurs, rubs, gallops RESPIRATORY:  Clear to auscultation without rales, wheezing or rhonchi  ABDOMEN: Soft, non-tender, non-distended MUSCULOSKELETAL:  No edema; No deformity  SKIN: Warm and dry NEUROLOGIC:  Alert and oriented x 3 PSYCHIATRIC:  Normal affect    Signed, Norman Herrlich, MD  04/12/2023 1:31 PM    [facility]

## 2023-04-12 ENCOUNTER — Encounter: Payer: Self-pay | Admitting: Cardiology

## 2023-04-12 ENCOUNTER — Ambulatory Visit: Payer: Medicare Other | Attending: Cardiology | Admitting: Cardiology

## 2023-04-12 VITALS — BP 120/60 | HR 55 | Ht 66.0 in | Wt 153.2 lb

## 2023-04-12 DIAGNOSIS — I350 Nonrheumatic aortic (valve) stenosis: Secondary | ICD-10-CM

## 2023-04-12 DIAGNOSIS — E782 Mixed hyperlipidemia: Secondary | ICD-10-CM | POA: Diagnosis not present

## 2023-04-12 DIAGNOSIS — R079 Chest pain, unspecified: Secondary | ICD-10-CM

## 2023-04-12 DIAGNOSIS — I351 Nonrheumatic aortic (valve) insufficiency: Secondary | ICD-10-CM | POA: Diagnosis not present

## 2023-04-12 DIAGNOSIS — I10 Essential (primary) hypertension: Secondary | ICD-10-CM | POA: Diagnosis not present

## 2023-04-12 DIAGNOSIS — R072 Precordial pain: Secondary | ICD-10-CM

## 2023-04-12 MED ORDER — METOPROLOL TARTRATE 25 MG PO TABS: 25.0000 mg | ORAL_TABLET | Freq: Once | ORAL | 0 refills | Status: DC

## 2023-04-12 NOTE — Addendum Note (Signed)
Addended by: Roxanne Mins I on: 04/12/2023 02:30 PM   Modules accepted: Orders

## 2023-04-12 NOTE — Patient Instructions (Signed)
Medication Instructions:  Your physician has recommended you make the following change in your medication:   STOP: Amlodipine  *If you need a refill on your cardiac medications before your next appointment, please call your pharmacy*   Lab Work: Your physician recommends that you return for lab work in:   Labs 1 week before CT: BMP, Pro BNP  If you have labs (blood work) drawn today and your tests are completely normal, you will receive your results only by: MyChart Message (if you have MyChart) OR A paper copy in the mail If you have any lab test that is abnormal or we need to change your treatment, we will call you to review the results.   Testing/Procedures:   Your cardiac CT will be scheduled at one of the below locations:   [facility] [address] [place], [place] [address] [phone]  OR  [facility] [address] [place], [place] [address] [phone]  OR   [facility] [address] [place], [place] [address] [phone]  If scheduled at [facility], please arrive at the [facility] and Children's Entrance (Entrance C2) of [facility] 30 minutes prior to test start time. You can use the FREE valet parking offered at entrance C (encouraged to control the heart rate for the test)  Proceed to the [facility] Radiology Department (first floor) to check-in and test prep.  All radiology patients and guests should use entrance C2 at [facility], accessed from [facility], even though the hospital's physical address listed is [address].    If scheduled at [facility] or [facility], please arrive 15 mins early for check-in and test prep.  There is spacious parking and easy access to the radiology department from the [facility] entrance. Please enter here and check-in with  the desk attendant.   Please follow these instructions carefully (unless otherwise directed):  An IV will be required for this test and Nitroglycerin will be given.  Hold all erectile dysfunction medications at least 3 days (72 hrs) prior to test. (Ie viagra, cialis, sildenafil, tadalafil, etc)   On the Night Before the Test: Be sure to Drink plenty of water. Do not consume any caffeinated/decaffeinated beverages or chocolate 12 hours prior to your test. Do not take any antihistamines 12 hours prior to your test.  On the Day of the Test: Drink plenty of water until 1 hour prior to the test. Do not eat any food 1 hour prior to test. You may take your regular medications prior to the test.  Take metoprolol (Lopressor) two hours prior to test. If you take Furosemide please HOLD on the morning of the test.  After the Test: Drink plenty of water. After receiving IV contrast, you may experience a mild flushed feeling. This is normal. On occasion, you may experience a mild rash up to 24 hours after the test. This is not dangerous. If this occurs, you can take Benadryl 25 mg and increase your fluid intake. If you experience trouble breathing, this can be serious. If it is severe call 911 IMMEDIATELY. If it is mild, please call our office. If you take any of these medications: Glipizide/Metformin, Avandament, Glucavance, please do not take 48 hours after completing test unless otherwise instructed.  We will call to schedule your test 2-4 weeks out understanding that some insurance companies will need an authorization prior  to the service being performed.   For more information and frequently asked questions, please visit our website : [email or web address]  For non-scheduling related questions, please contact the cardiac imaging nurse navigator should you have any questions/concerns: Cardiac Imaging Nurse Navigators Direct Office Dial: [phone]   For scheduling needs,  including cancellations and rescheduling, please call [place], [phone].    Follow-Up: At [facility], you and your health needs are our priority.  As part of our continuing mission to provide you with exceptional heart care, we have created designated Provider Care Teams.  These Care Teams include your primary Cardiologist (physician) and Advanced Practice Providers (APPs -  Physician Assistants and Nurse Practitioners) who all work together to provide you with the care you need, when you need it.  We recommend signing up for the patient portal called "MyChart".  Sign up information is provided on this After Visit Summary.  MyChart is used to connect with patients for Virtual Visits (Telemedicine).  Patients are able to view lab/test results, encounter notes, upcoming appointments, etc.  Non-urgent messages can be sent to your provider as well.   To learn more about what you can do with MyChart, go to [email or web address].    Your next appointment:   6 week(s)  Provider:   Norman Herrlich, MD    Other Instructions None

## 2023-04-15 DIAGNOSIS — I350 Nonrheumatic aortic (valve) stenosis: Secondary | ICD-10-CM | POA: Diagnosis not present

## 2023-04-15 DIAGNOSIS — I10 Essential (primary) hypertension: Secondary | ICD-10-CM | POA: Diagnosis not present

## 2023-04-15 DIAGNOSIS — I351 Nonrheumatic aortic (valve) insufficiency: Secondary | ICD-10-CM | POA: Diagnosis not present

## 2023-04-15 DIAGNOSIS — E782 Mixed hyperlipidemia: Secondary | ICD-10-CM | POA: Diagnosis not present

## 2023-04-16 LAB — BASIC METABOLIC PANEL
BUN/Creatinine Ratio: 18 (ref 10–24)
BUN: 18 mg/dL (ref 8–27)
CO2: 25 mmol/L (ref 20–29)
Calcium: 9.2 mg/dL (ref 8.6–10.2)
Chloride: 102 mmol/L (ref 96–106)
Creatinine, Ser: 1.01 mg/dL (ref 0.76–1.27)
Glucose: 142 mg/dL — ABNORMAL HIGH (ref 70–99)
Potassium: 4.1 mmol/L (ref 3.5–5.2)
Sodium: 140 mmol/L (ref 134–144)
eGFR: 74 mL/min/{1.73_m2} (ref >59)

## 2023-04-16 LAB — PRO B NATRIURETIC PEPTIDE: NT-Pro BNP: 200 pg/mL (ref 0–486)

## 2023-04-17 ENCOUNTER — Telehealth (HOSPITAL_COMMUNITY): Payer: Self-pay | Admitting: *Deleted

## 2023-04-17 NOTE — Telephone Encounter (Signed)
Reaching out to patient to offer assistance regarding upcoming cardiac imaging study; pt verbalizes understanding of appt date/time, parking situation and where to check in, pre-test NPO status and medications ordered, and verified current allergies; name and call back number provided for further questions should they arise Hayley Sharpe RN Navigator Cardiac Imaging [facility] [phone] office [phone] cell  

## 2023-04-18 ENCOUNTER — Encounter (HOSPITAL_COMMUNITY): Payer: Self-pay

## 2023-04-18 ENCOUNTER — Other Ambulatory Visit (HOSPITAL_COMMUNITY): Payer: Self-pay | Admitting: *Deleted

## 2023-04-18 ENCOUNTER — Ambulatory Visit (HOSPITAL_COMMUNITY)
Admission: RE | Admit: 2023-04-18 | Discharge: 2023-04-18 | Disposition: A | Discharge status: Home / Self Care | Payer: Medicare Other | Source: Ambulatory Visit | Attending: Cardiology | Admitting: Cardiology

## 2023-04-18 DIAGNOSIS — R072 Precordial pain: Secondary | ICD-10-CM | POA: Insufficient documentation

## 2023-04-18 DIAGNOSIS — R931 Abnormal findings on diagnostic imaging of heart and coronary circulation: Secondary | ICD-10-CM | POA: Insufficient documentation

## 2023-04-18 MED ORDER — PREDNISONE 50 MG PO TABS: ORAL_TABLET | ORAL | 0 refills | Status: DC

## 2023-04-18 MED ORDER — NITROGLYCERIN 0.4 MG SL SUBL
SUBLINGUAL_TABLET | SUBLINGUAL | Status: AC
  Filled 2023-04-18: qty 2

## 2023-04-18 MED ORDER — NITROGLYCERIN 0.4 MG SL SUBL: 0.8000 mg | SUBLINGUAL_TABLET | Freq: Once | SUBLINGUAL | Status: DC

## 2023-04-18 MED ORDER — DIPHENHYDRAMINE HCL 50 MG PO TABS: ORAL_TABLET | ORAL | 0 refills | Status: DC

## 2023-04-22 ENCOUNTER — Other Ambulatory Visit: Payer: Self-pay | Admitting: Cardiology

## 2023-04-22 ENCOUNTER — Telehealth (HOSPITAL_COMMUNITY): Payer: Self-pay | Admitting: Emergency Medicine

## 2023-04-22 ENCOUNTER — Other Ambulatory Visit (HOSPITAL_COMMUNITY): Payer: Self-pay | Admitting: Emergency Medicine

## 2023-04-22 DIAGNOSIS — R079 Chest pain, unspecified: Secondary | ICD-10-CM

## 2023-04-22 MED ORDER — METOPROLOL TARTRATE 25 MG PO TABS
25.0000 mg | ORAL_TABLET | Freq: Once | ORAL | 0 refills | Status: DC
Start: 2023-04-22 — End: 2023-05-02

## 2023-04-22 NOTE — Telephone Encounter (Signed)
Reaching out to patient to offer assistance regarding upcoming cardiac imaging study; pt verbalizes understanding of appt date/time, parking situation and where to check in, pre-test NPO status and medications ordered, and verified current allergies; name and call back number provided for further questions should they arise Rockwell Alexandria RN Navigator Cardiac Imaging [facility] [phone] office [phone] cell  Reviewed times for 13 hr prep and metoprolol

## 2023-04-22 NOTE — Telephone Encounter (Signed)
MRI brain: [employer] medicare auth: [id number]    exp. 04/22/23-05/21/23 MRA head/neck [employer] medicare Berkley Harvey: [id number] exp. 04/22/23-05/21/23

## 2023-04-23 ENCOUNTER — Ambulatory Visit (HOSPITAL_COMMUNITY)
Admission: RE | Admit: 2023-04-23 | Discharge: 2023-04-23 | Disposition: A | Discharge status: Home / Self Care | Payer: Medicare Other | Source: Ambulatory Visit | Attending: Cardiology | Admitting: Cardiology

## 2023-04-23 DIAGNOSIS — I2510 Atherosclerotic heart disease of native coronary artery without angina pectoris: Secondary | ICD-10-CM | POA: Diagnosis not present

## 2023-04-23 DIAGNOSIS — R072 Precordial pain: Secondary | ICD-10-CM | POA: Diagnosis not present

## 2023-04-23 MED ORDER — METOPROLOL TARTRATE 5 MG/5ML IV SOLN
INTRAVENOUS | Status: AC
  Filled 2023-04-23: qty 10

## 2023-04-23 MED ORDER — NITROGLYCERIN 0.4 MG SL SUBL
SUBLINGUAL_TABLET | SUBLINGUAL | Status: AC
  Filled 2023-04-23: qty 2

## 2023-04-23 MED ORDER — IOHEXOL 350 MG/ML SOLN
95.0000 mL | Freq: Once | INTRAVENOUS | Status: AC | PRN
  Administered 2023-04-23: 95 mL via INTRAVENOUS

## 2023-04-23 MED ORDER — METOPROLOL TARTRATE 5 MG/5ML IV SOLN
10.0000 mg | INTRAVENOUS | Status: AC | PRN
  Administered 2023-04-23 (×2): 5 mg via INTRAVENOUS

## 2023-04-23 MED ORDER — NITROGLYCERIN 0.4 MG SL SUBL
0.8000 mg | SUBLINGUAL_TABLET | Freq: Once | SUBLINGUAL | Status: AC
  Administered 2023-04-23: 0.8 mg via SUBLINGUAL

## 2023-04-24 DIAGNOSIS — K080 Exfoliation of teeth due to systemic causes: Secondary | ICD-10-CM | POA: Diagnosis not present

## 2023-04-30 ENCOUNTER — Other Ambulatory Visit: Payer: Self-pay | Admitting: Cardiology

## 2023-04-30 ENCOUNTER — Ambulatory Visit (HOSPITAL_BASED_OUTPATIENT_CLINIC_OR_DEPARTMENT_OTHER)
Admission: RE | Admit: 2023-04-30 | Discharge: 2023-04-30 | Disposition: A | Discharge status: Home / Self Care | Payer: Medicare Other | Source: Ambulatory Visit | Attending: Cardiology | Admitting: Cardiology

## 2023-04-30 DIAGNOSIS — R931 Abnormal findings on diagnostic imaging of heart and coronary circulation: Secondary | ICD-10-CM | POA: Diagnosis not present

## 2023-04-30 DIAGNOSIS — I2510 Atherosclerotic heart disease of native coronary artery without angina pectoris: Secondary | ICD-10-CM

## 2023-04-30 DIAGNOSIS — R072 Precordial pain: Secondary | ICD-10-CM | POA: Diagnosis not present

## 2023-05-01 ENCOUNTER — Telehealth: Payer: Self-pay

## 2023-05-01 NOTE — Telephone Encounter (Signed)
-----   Message from Nurse Corky Crafts sent at 05/01/2023 11:26 AM EDT ----- Dr. Dulce Sellar pt ----- Message ----- From: Quintella Reichert, MD Sent: 04/30/2023   8:06 PM EDT To: [address] Triage  This is Dr. Hulen Shouts patient with abnormal coronary CTA and FFR and needs a cath.  Please get him in with one of Dr. Osker Mason partners or the DOD in [place] tomorrow 9/4 to discuss results and get set up for cath

## 2023-05-01 NOTE — Telephone Encounter (Signed)
Left vm to callback on cell and home number

## 2023-05-02 ENCOUNTER — Ambulatory Visit: Payer: Medicare Other | Attending: Cardiology | Admitting: Cardiology

## 2023-05-02 ENCOUNTER — Other Ambulatory Visit: Payer: Self-pay

## 2023-05-02 VITALS — BP 138/58 | HR 66 | Ht 66.0 in | Wt 151.8 lb

## 2023-05-02 DIAGNOSIS — I350 Nonrheumatic aortic (valve) stenosis: Secondary | ICD-10-CM

## 2023-05-02 DIAGNOSIS — I10 Essential (primary) hypertension: Secondary | ICD-10-CM | POA: Diagnosis not present

## 2023-05-02 DIAGNOSIS — I351 Nonrheumatic aortic (valve) insufficiency: Secondary | ICD-10-CM

## 2023-05-02 DIAGNOSIS — E119 Type 2 diabetes mellitus without complications: Secondary | ICD-10-CM | POA: Diagnosis not present

## 2023-05-02 DIAGNOSIS — Z7984 Long term (current) use of oral hypoglycemic drugs: Secondary | ICD-10-CM

## 2023-05-02 DIAGNOSIS — I209 Angina pectoris, unspecified: Secondary | ICD-10-CM

## 2023-05-02 HISTORY — DX: Angina pectoris, unspecified: I20.9

## 2023-05-02 MED ORDER — PREDNISONE 50 MG PO TABS: ORAL_TABLET | ORAL | 0 refills | Status: DC

## 2023-05-02 NOTE — Progress Notes (Signed)
Cardiology Office Note:    Date:  05/02/2023   ID:  Bruce Nunez, DOB [birth year], MRN [id number]  PCP:  Gus Height, PA  Cardiologist:  Garwin Brothers, MD   Referring MD: Gus Height, [name]    ASSESSMENT:    1. Angina pectoris (HCC)   2. Essential hypertension   3. Diabetes mellitus without complication (HCC)   4. Aortic valve stenosis, etiology of cardiac valve disease unspecified   5. Aortic valve insufficiency, etiology of cardiac valve disease unspecified    PLAN:    In order of problems listed above:  Angina pectoris: Obstructive disease on CT coronary angiography: I discussed my findings with the patient at length.  He is taking Plavix on a regular basis.  Sublingual nitroglycerin prescription was sent, its protocol and 911 protocol explained and the patient vocalized understanding questions were answered to the patient's satisfaction.  His symptoms are concerning.I discussed coronary angiography and left heart catheterization with the patient at extensive length. Procedure, benefits and potential risks were explained. Patient had multiple questions which were answered to the patient's satisfaction. Patient agreed and consented for the procedure. Further recommendations will be made based on the findings of the coronary angiography. In the interim. The patient has any significant symptoms he knows to go to the nearest emergency room. Valvular heart disease: Mild to moderate aortic stenosis and moderate to severe aortic regurgitation on recent echo: He would definitely benefit from this evaluation also.  He may be a candidate only for coronary intervention or based on the findings of the stress he might be a candidate for coronary intervention and TAVR.  Alternatively based on these findings he may be a candidate for surgical treatment.  I will leave this to the evaluation of her valvular heart disease clinic and Dr. Excell Seltzer who have referred her for this procedure. Essential  hypertension: Blood pressure is stable and diet was emphasized.  Lifestyle modification urged. Mixed dyslipidemia: On lipid-lowering medications followed by primary care.  Lipids were reviewed and found to be fine. He will be seen in follow-up appointment after the coronary angiography.   Medication Adjustments/Labs and Tests Ordered: Current medicines are reviewed at length with the patient today.  Concerns regarding medicines are outlined above.  No orders of the defined types were placed in this encounter.  No orders of the defined types were placed in this encounter.    No chief complaint on file.    History of Present Illness:    Bruce Nunez is a 82 y.o. male.  Patient has past medical history of essential hypertension, dyslipidemia, diabetes mellitus, mild to moderate aortic stenosis and moderate to severe regurgitation.  Patient has chest tightness on exertion and underwent CT coronary angiography and this reveals obstructive disease.  Therefore patient was called to discuss further evaluation.  He sees Dr. Dulce Sellar on a regular basis and Dr. Dulce Sellar is away on vacation.  At the time of my evaluation, the patient is alert awake oriented and in no distress.  Past Medical History:  Diagnosis Date   Anemia    recent iron supplement   Aortic regurgitation 03/12/2017   Aortic stenosis 04/29/2019   Asthma    childhood-   2013 bronchitis w/wheezing   Complication of anesthesia    " ether made me sick" - "woke up during colonoscopy"   Diabetes mellitus without complication (HCC)    Essential hypertension 03/12/2017   Frequency of urination    GERD (gastroesophageal reflux disease)  occasional   Heart murmur    Hemorrhoids    History of kidney stones    Hx of colon cancer, stage I    Hx of nonmelanoma skin cancer    Hx of peptic ulcer    as teenager   Hx of transfusion    Hyperlipidemia    Hypertension    Inguinal hernia    PONV (postoperative nausea and vomiting)     only when had Ether as Anesthesia drug   Prostate cancer (HCC) 08/15/2012   also Hx colon cancer / Hx skin cancer   Right inguinal hernia 05/08/2016   S/P right inguinal hernia repair Sept 2015 05/17/2014   Recurrent right indirect  Hernia treated with robotic TAPP repair with enterolysis Sept 2017   Stroke [facility]) 03/28/2022    Past Surgical History:  Procedure Laterality Date   cataract surgery  2009   CYSTOSCOPY     INGUINAL HERNIA REPAIR Right 05/17/2014   Procedure: RIGHT INGUINAL HERNIA REPAIR ;  Surgeon: Wenda Low, MD;  Location: [facility];  Service: General;  Laterality: Right;  With MESH   KNEE SURGERY Right 2012   PARTIAL COLECTOMY  2005   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;  Service: Urology;  Laterality: N/A;   71seeds implanted  no seeds found in bladder   THROAT SURGERY  1940's   as child (age 34)growth that created fissure/corrective surgery   TONSILLECTOMY     age 21   XI ROBOTIC ASSISTED INGUINAL HERNIA REPAIR WITH MESH Right 05/08/2016   Procedure: XI ROBOTIC ASSISTED REPAIR OF RECURRENT RIGHT INGUINAL HERNIA;  Surgeon: Luretha Murphy, MD;  Location: [facility];  Service: General;  Laterality: Right;  With MESH    Current Medications: Current Meds  Medication Sig   albuterol (PROVENTIL HFA;VENTOLIN HFA) 108 (90 BASE) MCG/ACT inhaler Inhale 2 puffs into the lungs every 6 (six) hours as needed for wheezing.   bisoprolol-hydrochlorothiazide (ZIAC) 10-6.25 MG per tablet Take 1 tablet by mouth every morning.    budesonide-formoterol (SYMBICORT) 160-4.5 MCG/ACT inhaler Inhale 2 puffs into the lungs 2 (two) times daily.   clopidogrel (PLAVIX) 75 MG tablet Take 1 tablet (75 mg total) by mouth daily.   cyanocobalamin (,VITAMIN B-12,) 1000 MCG/ML injection Inject 1 mL into the skin every 30 (thirty) days.   dicyclomine (BENTYL)  10 MG capsule Take 10 mg by mouth 2 (two) times daily as needed for cramping.   fesoterodine (TOVIAZ) 4 MG TB24 tablet Take 4 mg by mouth daily.   fexofenadine (ALLEGRA) 180 MG tablet Take 180 mg by mouth daily.   fluticasone (FLONASE) 50 MCG/ACT nasal spray Place 1 spray into both nostrils daily.    fluticasone-salmeterol (ADVAIR HFA) 115-21 MCG/ACT inhaler Inhale 2 puffs into the lungs 2 (two) times daily.   furosemide (LASIX) 20 MG tablet Take 10-20 mg by mouth daily as needed for fluid or edema.   glimepiride (AMARYL) 2 MG tablet Take 2 mg by mouth daily.   isosorbide mononitrate (IMDUR) 30 MG 24 hr tablet Take 1 tablet (30 mg total) by mouth daily.   levothyroxine (SYNTHROID) 25 MCG tablet Take 1 tablet by mouth 3 (three) times a week.   Loperamide HCl (IMODIUM A-D PO) Take 1 capsule by mouth as needed (Diarrhea).   losartan (COZAAR) 100 MG tablet Take 100 mg by mouth every other day.  nitroGLYCERIN (NITROSTAT) 0.4 MG SL tablet DISSOLVE 1 TABLET UNDER THE TONGUE EVERY 5 MINUTES AS NEEDED FOR CHEST PAIN. DO NOT EXCEED A TOTAL OF 3 DOSES IN 15 MINUTES.   omeprazole (PRILOSEC) 20 MG capsule Take 20 mg by mouth daily.   Probiotic Product (PROBIOTIC DAILY PO) Take 1 capsule by mouth daily.   rosuvastatin (CRESTOR) 20 MG tablet Take 1 tablet (20 mg total) by mouth daily.   solifenacin (VESICARE) 10 MG tablet Take 10 mg by mouth daily.   Tamsulosin HCl (FLOMAX) 0.4 MG CAPS Take 0.4 mg by mouth 2 (two) times daily.    telmisartan (MICARDIS) 20 MG tablet Take 1 tablet (20 mg total) by mouth daily.     Allergies:   Penicillins, Iodinated contrast media, Aspirin, Monodox [doxycycline hyclate], Sulfa antibiotics, and Tramadol   Social History   Socioeconomic History   Marital status: Married    Spouse name: Not on file   Number of children: 2   Years of education: Not on file   Highest education level: Not on file  Occupational History   Occupation: Retired    Comment: Designer, fashion/clothing  Tobacco  Use   Smoking status: Never    Passive exposure: Never   Smokeless tobacco: Never  Vaping Use   Vaping status: Never Used  Substance and Sexual Activity   Alcohol use: No   Drug use: No   Sexual activity: Not on file  Other Topics Concern   Not on file  Social History Narrative   Not on file   Social Determinants of Health   Financial Resource Strain: Not on file  Food Insecurity: Not on file  Transportation Needs: Not on file  Physical Activity: Not on file  Stress: Not on file  Social Connections: Not on file     Family History: The patient's family history includes Brain cancer in an other family member; Coronary artery disease in an other family member; Heart attack in an other family member; Prostate cancer in an other family member.  ROS:   Please see the history of present illness.    All other systems reviewed and are negative.  EKGs/Labs/Other Studies Reviewed:    The following studies were reviewed today: I reviewed EKG done earlier.  Sinus bradycardia LVH inferior infarction and nonspecific ST-T changes  Recent Labs: 03/21/2023: TSH 2.620 04/15/2023: BUN 18; Creatinine, Ser 1.01; NT-Pro BNP 200; Potassium 4.1; Sodium 140  Recent Lipid Panel    Component Value Date/Time   CHOL 95 (L) 03/21/2023 1059   TRIG 137 03/21/2023 1059   HDL 32 (L) 03/21/2023 1059   CHOLHDL 3.0 03/21/2023 1059   LDLCALC 39 03/21/2023 1059    Physical Exam:    VS:  BP (!) 138/58   Pulse 66   Ht 5\' 6"  (1.676 m)   Wt 151 lb 12.8 oz (68.9 kg)   SpO2 94%   BMI 24.50 kg/m     Wt Readings from Last 3 Encounters:  05/02/23 151 lb 12.8 oz (68.9 kg)  04/12/23 153 lb 3.2 oz (69.5 kg)  03/21/23 154 lb 12.8 oz (70.2 kg)     GEN: Patient is in no acute distress HEENT: Normal NECK: No JVD; No carotid bruits LYMPHATICS: No lymphadenopathy CARDIAC: Hear sounds regular, 2/6 systolic murmur at the apex. RESPIRATORY:  Clear to auscultation without rales, wheezing or rhonchi  ABDOMEN:  Soft, non-tender, non-distended MUSCULOSKELETAL:  No edema; No deformity  SKIN: Warm and dry NEUROLOGIC:  Alert and oriented x 3 PSYCHIATRIC:  Normal affect   Signed, Garwin Brothers, MD  05/02/2023 1:20 PM    [facility]

## 2023-05-02 NOTE — H&P (View-Only) (Signed)
Cardiology Office Note:    Date:  05/02/2023   ID:  Bruce Nunez, DOB [birth year], MRN [id number]  PCP:  Gus Height, PA  Cardiologist:  Garwin Brothers, MD   Referring MD: Gus Height, [name]    ASSESSMENT:    1. Angina pectoris (HCC)   2. Essential hypertension   3. Diabetes mellitus without complication (HCC)   4. Aortic valve stenosis, etiology of cardiac valve disease unspecified   5. Aortic valve insufficiency, etiology of cardiac valve disease unspecified    PLAN:    In order of problems listed above:  Angina pectoris: Obstructive disease on CT coronary angiography: I discussed my findings with the patient at length.  He is taking Plavix on a regular basis.  Sublingual nitroglycerin prescription was sent, its protocol and 911 protocol explained and the patient vocalized understanding questions were answered to the patient's satisfaction.  His symptoms are concerning.I discussed coronary angiography and left heart catheterization with the patient at extensive length. Procedure, benefits and potential risks were explained. Patient had multiple questions which were answered to the patient's satisfaction. Patient agreed and consented for the procedure. Further recommendations will be made based on the findings of the coronary angiography. In the interim. The patient has any significant symptoms he knows to go to the nearest emergency room. Valvular heart disease: Mild to moderate aortic stenosis and moderate to severe aortic regurgitation on recent echo: He would definitely benefit from this evaluation also.  He may be a candidate only for coronary intervention or based on the findings of the stress he might be a candidate for coronary intervention and TAVR.  Alternatively based on these findings he may be a candidate for surgical treatment.  I will leave this to the evaluation of her valvular heart disease clinic and Dr. Excell Seltzer who have referred her for this procedure. Essential  hypertension: Blood pressure is stable and diet was emphasized.  Lifestyle modification urged. Mixed dyslipidemia: On lipid-lowering medications followed by primary care.  Lipids were reviewed and found to be fine. He will be seen in follow-up appointment after the coronary angiography.   Medication Adjustments/Labs and Tests Ordered: Current medicines are reviewed at length with the patient today.  Concerns regarding medicines are outlined above.  No orders of the defined types were placed in this encounter.  No orders of the defined types were placed in this encounter.    No chief complaint on file.    History of Present Illness:    Bruce Nunez is a 82 y.o. male.  Patient has past medical history of essential hypertension, dyslipidemia, diabetes mellitus, mild to moderate aortic stenosis and moderate to severe regurgitation.  Patient has chest tightness on exertion and underwent CT coronary angiography and this reveals obstructive disease.  Therefore patient was called to discuss further evaluation.  He sees Dr. Dulce Sellar on a regular basis and Dr. Dulce Sellar is away on vacation.  At the time of my evaluation, the patient is alert awake oriented and in no distress.  Past Medical History:  Diagnosis Date   Anemia    recent iron supplement   Aortic regurgitation 03/12/2017   Aortic stenosis 04/29/2019   Asthma    childhood-   2013 bronchitis w/wheezing   Complication of anesthesia    " ether made me sick" - "woke up during colonoscopy"   Diabetes mellitus without complication (HCC)    Essential hypertension 03/12/2017   Frequency of urination    GERD (gastroesophageal reflux disease)  occasional   Heart murmur    Hemorrhoids    History of kidney stones    Hx of colon cancer, stage I    Hx of nonmelanoma skin cancer    Hx of peptic ulcer    as teenager   Hx of transfusion    Hyperlipidemia    Hypertension    Inguinal hernia    PONV (postoperative nausea and vomiting)     only when had Ether as Anesthesia drug   Prostate cancer (HCC) 08/15/2012   also Hx colon cancer / Hx skin cancer   Right inguinal hernia 05/08/2016   S/P right inguinal hernia repair Sept 2015 05/17/2014   Recurrent right indirect  Hernia treated with robotic TAPP repair with enterolysis Sept 2017   Stroke [facility]) 03/28/2022    Past Surgical History:  Procedure Laterality Date   cataract surgery  2009   CYSTOSCOPY     INGUINAL HERNIA REPAIR Right 05/17/2014   Procedure: RIGHT INGUINAL HERNIA REPAIR ;  Surgeon: Wenda Low, MD;  Location: [facility];  Service: General;  Laterality: Right;  With MESH   KNEE SURGERY Right 2012   PARTIAL COLECTOMY  2005   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;  Service: Urology;  Laterality: N/A;   71seeds implanted  no seeds found in bladder   THROAT SURGERY  1940's   as child (age 34)growth that created fissure/corrective surgery   TONSILLECTOMY     age 21   XI ROBOTIC ASSISTED INGUINAL HERNIA REPAIR WITH MESH Right 05/08/2016   Procedure: XI ROBOTIC ASSISTED REPAIR OF RECURRENT RIGHT INGUINAL HERNIA;  Surgeon: Luretha Murphy, MD;  Location: [facility];  Service: General;  Laterality: Right;  With MESH    Current Medications: Current Meds  Medication Sig   albuterol (PROVENTIL HFA;VENTOLIN HFA) 108 (90 BASE) MCG/ACT inhaler Inhale 2 puffs into the lungs every 6 (six) hours as needed for wheezing.   bisoprolol-hydrochlorothiazide (ZIAC) 10-6.25 MG per tablet Take 1 tablet by mouth every morning.    budesonide-formoterol (SYMBICORT) 160-4.5 MCG/ACT inhaler Inhale 2 puffs into the lungs 2 (two) times daily.   clopidogrel (PLAVIX) 75 MG tablet Take 1 tablet (75 mg total) by mouth daily.   cyanocobalamin (,VITAMIN B-12,) 1000 MCG/ML injection Inject 1 mL into the skin every 30 (thirty) days.   dicyclomine (BENTYL)  10 MG capsule Take 10 mg by mouth 2 (two) times daily as needed for cramping.   fesoterodine (TOVIAZ) 4 MG TB24 tablet Take 4 mg by mouth daily.   fexofenadine (ALLEGRA) 180 MG tablet Take 180 mg by mouth daily.   fluticasone (FLONASE) 50 MCG/ACT nasal spray Place 1 spray into both nostrils daily.    fluticasone-salmeterol (ADVAIR HFA) 115-21 MCG/ACT inhaler Inhale 2 puffs into the lungs 2 (two) times daily.   furosemide (LASIX) 20 MG tablet Take 10-20 mg by mouth daily as needed for fluid or edema.   glimepiride (AMARYL) 2 MG tablet Take 2 mg by mouth daily.   isosorbide mononitrate (IMDUR) 30 MG 24 hr tablet Take 1 tablet (30 mg total) by mouth daily.   levothyroxine (SYNTHROID) 25 MCG tablet Take 1 tablet by mouth 3 (three) times a week.   Loperamide HCl (IMODIUM A-D PO) Take 1 capsule by mouth as needed (Diarrhea).   losartan (COZAAR) 100 MG tablet Take 100 mg by mouth every other day.  nitroGLYCERIN (NITROSTAT) 0.4 MG SL tablet DISSOLVE 1 TABLET UNDER THE TONGUE EVERY 5 MINUTES AS NEEDED FOR CHEST PAIN. DO NOT EXCEED A TOTAL OF 3 DOSES IN 15 MINUTES.   omeprazole (PRILOSEC) 20 MG capsule Take 20 mg by mouth daily.   Probiotic Product (PROBIOTIC DAILY PO) Take 1 capsule by mouth daily.   rosuvastatin (CRESTOR) 20 MG tablet Take 1 tablet (20 mg total) by mouth daily.   solifenacin (VESICARE) 10 MG tablet Take 10 mg by mouth daily.   Tamsulosin HCl (FLOMAX) 0.4 MG CAPS Take 0.4 mg by mouth 2 (two) times daily.    telmisartan (MICARDIS) 20 MG tablet Take 1 tablet (20 mg total) by mouth daily.     Allergies:   Penicillins, Iodinated contrast media, Aspirin, Monodox [doxycycline hyclate], Sulfa antibiotics, and Tramadol   Social History   Socioeconomic History   Marital status: Married    Spouse name: Not on file   Number of children: 2   Years of education: Not on file   Highest education level: Not on file  Occupational History   Occupation: Retired    Comment: Designer, fashion/clothing  Tobacco  Use   Smoking status: Never    Passive exposure: Never   Smokeless tobacco: Never  Vaping Use   Vaping status: Never Used  Substance and Sexual Activity   Alcohol use: No   Drug use: No   Sexual activity: Not on file  Other Topics Concern   Not on file  Social History Narrative   Not on file   Social Determinants of Health   Financial Resource Strain: Not on file  Food Insecurity: Not on file  Transportation Needs: Not on file  Physical Activity: Not on file  Stress: Not on file  Social Connections: Not on file     Family History: The patient's family history includes Brain cancer in an other family member; Coronary artery disease in an other family member; Heart attack in an other family member; Prostate cancer in an other family member.  ROS:   Please see the history of present illness.    All other systems reviewed and are negative.  EKGs/Labs/Other Studies Reviewed:    The following studies were reviewed today: I reviewed EKG done earlier.  Sinus bradycardia LVH inferior infarction and nonspecific ST-T changes  Recent Labs: 03/21/2023: TSH 2.620 04/15/2023: BUN 18; Creatinine, Ser 1.01; NT-Pro BNP 200; Potassium 4.1; Sodium 140  Recent Lipid Panel    Component Value Date/Time   CHOL 95 (L) 03/21/2023 1059   TRIG 137 03/21/2023 1059   HDL 32 (L) 03/21/2023 1059   CHOLHDL 3.0 03/21/2023 1059   LDLCALC 39 03/21/2023 1059    Physical Exam:    VS:  BP (!) 138/58   Pulse 66   Ht 5\' 6"  (1.676 m)   Wt 151 lb 12.8 oz (68.9 kg)   SpO2 94%   BMI 24.50 kg/m     Wt Readings from Last 3 Encounters:  05/02/23 151 lb 12.8 oz (68.9 kg)  04/12/23 153 lb 3.2 oz (69.5 kg)  03/21/23 154 lb 12.8 oz (70.2 kg)     GEN: Patient is in no acute distress HEENT: Normal NECK: No JVD; No carotid bruits LYMPHATICS: No lymphadenopathy CARDIAC: Hear sounds regular, 2/6 systolic murmur at the apex. RESPIRATORY:  Clear to auscultation without rales, wheezing or rhonchi  ABDOMEN:  Soft, non-tender, non-distended MUSCULOSKELETAL:  No edema; No deformity  SKIN: Warm and dry NEUROLOGIC:  Alert and oriented x 3 PSYCHIATRIC:  Normal affect   Signed, Garwin Brothers, MD  05/02/2023 1:20 PM    [facility]

## 2023-05-02 NOTE — Patient Instructions (Addendum)
Medication Instructions:  Your physician has recommended you make the following change in your medication:   Use nitroglycerin 1 tablet placed under the tongue at the first sign of chest pain or an angina attack. 1 tablet may be used every 5 minutes as needed, for up to 15 minutes. Do not take more than 3 tablets in 15 minutes. If pain persist call 911 or go to the nearest ED.   *If you need a refill on your cardiac medications before your next appointment, please call your pharmacy*   Lab Work: Your physician recommends that you have a BMET and CBC today in the office for your upcoming procedure.  If you have labs (blood work) drawn today and your tests are completely normal, you will receive your results only by: MyChart Message (if you have MyChart) OR A paper copy in the mail If you have any lab test that is abnormal or we need to change your treatment, we will call you to review the results.   Testing/Procedures:  [place] [facility] [place] [address] [place] [address] Dept: [phone] Loc: [phone]  Bruce Nunez  05/02/2023  You are scheduled for a Cardiac Catheterization on Friday, September 6 with Dr. Tonny Bollman.  1. Please arrive at the [facility] (Main Entrance A) at [facility]: [address] [place], [place] [address] at 1:00 PM (This time is 2 hour(s) before your procedure to ensure your preparation). Free valet parking service is available. You will check in at ADMITTING. The support person will be asked to wait in the waiting room.  It is OK to have someone drop you off and come back when you are ready to be discharged.    Special note: Every effort is made to have your procedure done on time. Please understand that emergencies sometimes delay scheduled procedures.  2. Diet: Do not eat solid foods after midnight.  The patient may have clear liquids until 5am upon the  day of the procedure.  3. Labs: You had labs done today  4. Medication instructions in preparation for your procedure:   Contrast Allergy: Yes, Please take Prednisone 50mg  by mouth at: Thirteen hours prior to cath 12:00am on Friday Seven hours prior to cath 2:00am on Friday And prior to leaving home please take last dose of Prednisone 50mg  and Benadryl 50mg  by mouth.   Stop taking, Cozaar (Losartan), Micardis (Telmisartan) Friday, September 5,, Lasix (Furosemide)  Friday, September 5,  On the morning of your procedure, take your Plavix/Clopidogrel and any morning medicines NOT listed above.  You may use sips of water.  5. Plan to go home the same day, you will only stay overnight if medically necessary. 6. Bring a current list of your medications and current insurance cards. 7. You MUST have a responsible person to drive you home. 8. Someone MUST be with you the first 24 hours after you arrive home or your discharge will be delayed. 9. Please wear clothes that are easy to get on and off and wear slip-on shoes.  Thank you for allowing [place] to care for you!   -- [facility]    Follow-Up: At [facility], you and your health needs are our priority.  As part of our continuing mission to provide you with exceptional heart care, we have created designated Provider Care Teams.  These Care Teams include your primary Cardiologist (physician) and Advanced  Practice Providers (APPs -  Physician Assistants and Nurse Practitioners) who all work together to provide you with the care you need, when you need it.  We recommend signing up for the patient portal called "MyChart".  Sign up information is provided on this After Visit Summary.  MyChart is used to connect with patients for Virtual Visits (Telemedicine).  Patients are able to view lab/test results, encounter notes, upcoming appointments, etc.  Non-urgent messages can be sent to your provider as well.   To learn  more about what you can do with MyChart, go to [email or web address].    Your next appointment:   1 month(s)  The format for your next appointment:   In Person  Provider:   Dr. Dulce Sellar   Other Instructions  Coronary Angiogram With Stent Coronary angiogram with stent placement is a procedure to widen or open a narrow blood vessel of the heart (coronary artery). Arteries may become blocked by cholesterol buildup (plaques) in the lining of the artery wall. When a coronary artery becomes partially blocked, blood flow to that area decreases. This may lead to chest pain or a heart attack (myocardial infarction). A stent is a small piece of metal that looks like mesh or spring. Stent placement may be done as treatment after a heart attack, or to prevent a heart attack if a blocked artery is found by a coronary angiogram. Let your health care provider know about: Any allergies you have, including allergies to medicines or contrast dye. All medicines you are taking, including vitamins, herbs, eye drops, creams, and over-the-counter medicines. Any problems you or family members have had with anesthetic medicines. Any blood disorders you have. Any surgeries you have had. Any medical conditions you have, including kidney problems or kidney failure. Whether you are pregnant or may be pregnant. Whether you are breastfeeding. What are the risks? Generally, this is a safe procedure. However, serious problems may occur, including: Damage to nearby structures or organs, such as the heart, blood vessels, or kidneys. A return of blockage. Bleeding, infection, or bruising at the insertion site. A collection of blood under the skin (hematoma) at the insertion site. A blood clot in another part of the body. Allergic reaction to medicines or dyes. Bleeding into the abdomen (retroperitoneal bleeding). Stroke (rare). Heart attack (rare). What happens before the procedure? Staying hydrated Follow  instructions from your health care provider about hydration, which may include: Up to 2 hours before the procedure - you may continue to drink clear liquids, such as water, clear fruit juice, black coffee, and plain tea.    Eating and drinking restrictions Follow instructions from your health care provider about eating and drinking, which may include: 8 hours before the procedure - stop eating heavy meals or foods, such as meat, fried foods, or fatty foods. 6 hours before the procedure - stop eating light meals or foods, such as toast or cereal. 2 hours before the procedure - stop drinking clear liquids. Medicines Ask your health care provider about: Changing or stopping your regular medicines. This is especially important if you are taking diabetes medicines or blood thinners. Taking medicines such as aspirin and ibuprofen. These medicines can thin your blood. Do not take these medicines unless your health care provider tells you to take them. Generally, aspirin is recommended before a thin tube, called a catheter, is passed through a blood vessel and inserted into the heart (cardiac catheterization). Taking over-the-counter medicines, vitamins, herbs, and supplements. General instructions Do not use any products  that contain nicotine or tobacco for at least 4 weeks before the procedure. These products include cigarettes, e-cigarettes, and chewing tobacco. If you need help quitting, ask your health care provider. Plan to have someone take you home from the hospital or clinic. If you will be going home right after the procedure, plan to have someone with you for 24 hours. You may have tests and imaging procedures. Ask your health care provider: How your insertion site will be marked. Ask which artery will be used for the procedure. What steps will be taken to help prevent infection. These may include: Removing hair at the insertion site. Washing skin with a germ-killing soap. Taking  antibiotic medicine. What happens during the procedure? An IV will be inserted into one of your veins. Electrodes may be placed on your chest to monitor your heart rate during the procedure. You will be given one or more of the following: A medicine to help you relax (sedative). A medicine to numb the area (local anesthetic) for catheter insertion. A small incision will be made for catheter insertion. The catheter will be inserted into an artery using a guide wire. The location may be in your groin, your wrist, or the fold of your arm (near your elbow). An X-ray procedure (fluoroscopy) will be used to help guide the catheter to the opening of the heart arteries. A dye will be injected into the catheter. X-rays will be taken. The dye helps to show where any narrowing or blockages are located in the arteries. Tell your health care provider if you have chest pain or trouble breathing. A tiny wire will be guided to the blocked spot, and a balloon will be inflated to make the artery wider. The stent will be expanded to crush the plaques into the wall of the vessel. The stent will hold the area open and improve the blood flow. Most stents have a drug coating to reduce the risk of the stent narrowing over time. The artery may be made wider using a drill, laser, or other tools that remove plaques. The catheter will be removed when the blood flow improves. The stent will stay where it was placed, and the lining of the artery will grow over it. A bandage (dressing) will be placed on the insertion site. Pressure will be applied to stop bleeding. The IV will be removed. This procedure may vary among health care providers and hospitals.    What happens after the procedure? Your blood pressure, heart rate, breathing rate, and blood oxygen level will be monitored until you leave the hospital or clinic. If the procedure is done through the leg, you will lie flat in bed for a few hours or for as long as told by  your health care provider. You will be instructed not to bend or cross your legs. The insertion site and the pulse in your foot or wrist will be checked often. You may have more blood tests, X-rays, and a test that records the electrical activity of your heart (electrocardiogram, or ECG). Do not drive for 24 hours if you were given a sedative during your procedure. Summary Coronary angiogram with stent placement is a procedure to widen or open a narrowed coronary artery. This is done to treat heart problems. Before the procedure, let your health care provider know about all the medical conditions and surgeries you have or have had. This is a safe procedure. However, some problems may occur, including damage to nearby structures or organs, bleeding, blood clots,  or allergies. Follow your health care provider's instructions about eating, drinking, medicines, and other lifestyle changes, such as quitting tobacco use before the procedure. This information is not intended to replace advice given to you by your health care provider. Make sure you discuss any questions you have with your health care provider. Document Revised: 03/04/2019 Document Reviewed: 03/04/2019 Elsevier Patient Education  2021 Elsevier Inc.  Aspirin and Your Heart Aspirin is a medicine that prevents the platelets in your blood from sticking together. Platelets are the cells that your blood uses for clotting. Aspirin can be used to help reduce the risk of blood clots, heart attacks, and other heart-related problems. What are the risks? Daily use of aspirin can cause side effects. Some of these include: Bleeding. Bleeding can be minor or serious. An example of minor bleeding is bleeding from a cut, and the bleeding does not stop. An example of more serious bleeding is stomach bleeding or, rarely, bleeding into the brain. Your risk of bleeding increases if you are also taking NSAIDs, such as ibuprofen. Increased bruising. Upset  stomach. An allergic reaction. People who have growths inside the nose (nasal polyps) have an increased risk of developing an aspirin allergy. How to use aspirin to care for your heart Take aspirin only as told by your health care provider. Make sure that you understand how much to take and what form to take. The two forms of aspirin are: Non-enteric-coated.This type of aspirin does not have a coating and is absorbed quickly. This type of aspirin also comes in a chewable form. Enteric-coated. This type of aspirin has a coating that releases the medicine very slowly. Enteric-coated aspirin might cause less stomach upset than non-enteric-coated aspirin. This type of aspirin should not be chewed or crushed. Work with your health care provider to find out whether it is safe and beneficial for you to take aspirin daily. Taking aspirin daily may be helpful if: You have had a heart attack or chest pain, or you are at risk for a heart attack. You have a condition in which certain heart vessels are blocked (coronary artery disease), and you have had a procedure to treat it. Examples are: Open-heart surgery, such as coronary artery bypass surgery (CABG). Coronary angioplasty,which is done to widen a blood vessel of your heart. Having a small mesh tube, or stent, placed in your coronary artery. You have had certain types of stroke or a mini-stroke known as a transient ischemic attack (TIA). You have a narrowing of the arteries that supply the limbs (peripheral artery disease, or PAD). You have long-term (chronic) heart rhythm problems, such as atrial fibrillation, and your health care provider thinks aspirin may help. You have valve disease or have had surgery on a valve. You are considered at increased risk of developing coronary artery disease or PAD.    Follow these instructions at home Medicines Take over-the-counter and prescription medicines only as told by your health care provider. If you are  taking blood thinners: Talk with your health care provider before you take any medicines that contain aspirin or NSAIDs, such as ibuprofen. These medicines increase your risk for dangerous bleeding. Take your medicine exactly as told, at the same time every day. Avoid activities that could cause injury or bruising, and follow instructions about how to prevent falls. Wear a medical alert bracelet or carry a card that lists what medicines you take. General instructions Do not drink alcohol if: Your health care provider tells you not to drink. You  are pregnant, may be pregnant, or are planning to become pregnant. If you drink alcohol: Limit how much you use to: 0-1 drink a day for women. 0-2 drinks a day for men. Be aware of how much alcohol is in your drink. In the [place] one drink equals one 12 oz bottle of beer (355 mL), one 5 oz glass of wine (148 mL), or one 1 oz glass of hard liquor (44 mL). Keep all follow-up visits as told by your health care provider. This is important. Where to find more information The American Heart Association: www.heart.org Contact a health care provider if you have: Unusual bleeding or bruising. Stomach pain or nausea. Ringing in your ears. An allergic reaction that causes hives, itchy skin, or swelling of the lips, tongue, or face. Get help right away if: You notice that your bowel movements are bloody, or dark red or black in color. You vomit or cough up blood. You have blood in your urine. You cough, breathe loudly (wheeze), or feel short of breath. You have chest pain, especially if the pain spreads to your arms, back, neck, or jaw. You have a headache with confusion. You have any symptoms of a stroke. "BE FAST" is an easy way to remember the main warning signs of a stroke: B - Balance. Signs are dizziness, sudden trouble walking, or loss of balance. E - Eyes. Signs are trouble seeing or a sudden change in vision. F - Face. Signs are sudden weakness or  numbness of the face, or the face or eyelid drooping on one side. A - Arms. Signs are weakness or numbness in an arm. This happens suddenly and usually on one side of the body. S - Speech. Signs are sudden trouble speaking, slurred speech, or trouble understanding what people say. T - Time. Time to call emergency services. Write down what time symptoms started. You have other signs of a stroke, such as: A sudden, severe headache with no known cause. Nausea or vomiting. Seizure. These symptoms may represent a serious problem that is an emergency. Do not wait to see if the symptoms will go away. Get medical help right away. Call your local emergency services (911 in the [place] Do not drive yourself to the hospital. Summary Aspirin use can help reduce the risk of blood clots, heart attacks, and other heart-related problems. Daily use of aspirin can cause side effects. Take aspirin only as told by your health care provider. Make sure that you understand how much to take and what form to take. Your health care provider will help you determine whether it is safe and beneficial for you to take aspirin daily. This information is not intended to replace advice given to you by your health care provider. Make sure you discuss any questions you have with your health care provider. Document Revised: 05/18/2019 Document Reviewed: 05/18/2019 Elsevier Patient Education  2021 Elsevier Inc. Nitroglycerin sublingual tablets What is this medicine? NITROGLYCERIN (nye troe GLI ser in) is a type of vasodilator. It relaxes blood vessels, increasing the blood and oxygen supply to your heart. This medicine is used to relieve chest pain caused by angina. It is also used to prevent chest pain before activities like climbing stairs, going outdoors in cold weather, or sexual activity. This medicine may be used for other purposes; ask your health care provider or pharmacist if you have questions. COMMON BRAND NAME(S):  Nitroquick, Nitrostat, Nitrotab What should I tell my health care provider before I take this medicine? They need  to know if you have any of these conditions: anemia head injury, recent stroke, or bleeding in the brain liver disease previous heart attack an unusual or allergic reaction to nitroglycerin, other medicines, foods, dyes, or preservatives pregnant or trying to get pregnant breast-feeding How should I use this medicine? Take this medicine by mouth as needed. Use at the first sign of an angina attack (chest pain or tightness). You can also take this medicine 5 to 10 minutes before an event likely to produce chest pain. Follow the directions exactly as written on the prescription label. Place one tablet under your tongue and let it dissolve. Do not swallow whole. Replace the dose if you accidentally swallow it. It will help if your mouth is not dry. Saliva around the tablet will help it to dissolve more quickly. Do not eat or drink, smoke or chew tobacco while a tablet is dissolving. Sit down when taking this medicine. In an angina attack, you should feel better within 5 minutes after your first dose. You can take a dose every 5 minutes up to a total of 3 doses. If you do not feel better or feel worse after 1 dose, call 9-1-1 at once. Do not take more than 3 doses in 15 minutes. Your health care provider might give you other directions. Follow those directions if he or she does. Do not take your medicine more often than directed. Talk to your health care provider about the use of this medicine in children. Special care may be needed. Overdosage: If you think you have taken too much of this medicine contact a poison control center or emergency room at once. NOTE: This medicine is only for you. Do not share this medicine with others. What if I miss a dose? This does not apply. This medicine is only used as needed. What may interact with this medicine? Do not take this medicine with any of  the following medications: certain migraine medicines like ergotamine and dihydroergotamine (DHE) medicines used to treat erectile dysfunction like sildenafil, tadalafil, and vardenafil riociguat This medicine may also interact with the following medications: alteplase aspirin heparin medicines for high blood pressure medicines for mental depression other medicines used to treat angina phenothiazines like chlorpromazine, mesoridazine, prochlorperazine, thioridazine This list may not describe all possible interactions. Give your health care provider a list of all the medicines, herbs, non-prescription drugs, or dietary supplements you use. Also tell them if you smoke, drink alcohol, or use illegal drugs. Some items may interact with your medicine. What should I watch for while using this medicine? Tell your doctor or health care professional if you feel your medicine is no longer working. Keep this medicine with you at all times. Sit or lie down when you take your medicine to prevent falling if you feel dizzy or faint after using it. Try to remain calm. This will help you to feel better faster. If you feel dizzy, take several deep breaths and lie down with your feet propped up, or bend forward with your head resting between your knees. You may get drowsy or dizzy. Do not drive, use machinery, or do anything that needs mental alertness until you know how this drug affects you. Do not stand or sit up quickly, especially if you are an older patient. This reduces the risk of dizzy or fainting spells. Alcohol can make you more drowsy and dizzy. Avoid alcoholic drinks. Do not treat yourself for coughs, colds, or pain while you are taking this medicine without asking  your doctor or health care professional for advice. Some ingredients may increase your blood pressure. What side effects may I notice from receiving this medicine? Side effects that you should report to your doctor or health care professional  as soon as possible: allergic reactions (skin rash, itching or hives; swelling of the face, lips, or tongue) low blood pressure (dizziness; feeling faint or lightheaded, falls; unusually weak or tired) low red blood cell counts (trouble breathing; feeling faint; lightheaded, falls; unusually weak or tired) Side effects that usually do not require medical attention (report to your doctor or health care professional if they continue or are bothersome): facial flushing (redness) headache nausea, vomiting This list may not describe all possible side effects. Call your doctor for medical advice about side effects. You may report side effects to FDA at 1-800-FDA-1088. Where should I keep my medicine? Keep out of the reach of children. Store at room temperature between 20 and 25 degrees C (68 and 77 degrees F). Store in Retail buyer. Protect from light and moisture. Keep tightly closed. Throw away any unused medicine after the expiration date. NOTE: This sheet is a summary. It may not cover all possible information. If you have questions about this medicine, talk to your doctor, pharmacist, or health care provider.  2021 Elsevier/Gold Standard (2018-05-14 16:46:32)

## 2023-05-03 ENCOUNTER — Other Ambulatory Visit: Payer: Self-pay

## 2023-05-03 ENCOUNTER — Encounter (HOSPITAL_COMMUNITY)
Admission: RE | Disposition: A | Discharge status: Home / Self Care | Payer: Self-pay | Source: Home / Self Care | Attending: Cardiovascular Disease

## 2023-05-03 ENCOUNTER — Ambulatory Visit (HOSPITAL_COMMUNITY)
Admission: RE | Admit: 2023-05-03 | Discharge: 2023-05-03 | Disposition: A | Discharge status: Home / Self Care | Payer: Medicare Other | Attending: Cardiovascular Disease | Admitting: Cardiovascular Disease

## 2023-05-03 DIAGNOSIS — I209 Angina pectoris, unspecified: Secondary | ICD-10-CM

## 2023-05-03 DIAGNOSIS — Z7902 Long term (current) use of antithrombotics/antiplatelets: Secondary | ICD-10-CM | POA: Diagnosis not present

## 2023-05-03 DIAGNOSIS — I25119 Atherosclerotic heart disease of native coronary artery with unspecified angina pectoris: Secondary | ICD-10-CM | POA: Insufficient documentation

## 2023-05-03 DIAGNOSIS — Z7984 Long term (current) use of oral hypoglycemic drugs: Secondary | ICD-10-CM | POA: Diagnosis not present

## 2023-05-03 DIAGNOSIS — I351 Nonrheumatic aortic (valve) insufficiency: Secondary | ICD-10-CM

## 2023-05-03 DIAGNOSIS — E119 Type 2 diabetes mellitus without complications: Secondary | ICD-10-CM | POA: Insufficient documentation

## 2023-05-03 DIAGNOSIS — I2584 Coronary atherosclerosis due to calcified coronary lesion: Secondary | ICD-10-CM | POA: Diagnosis not present

## 2023-05-03 DIAGNOSIS — I10 Essential (primary) hypertension: Secondary | ICD-10-CM | POA: Insufficient documentation

## 2023-05-03 DIAGNOSIS — E782 Mixed hyperlipidemia: Secondary | ICD-10-CM | POA: Diagnosis not present

## 2023-05-03 DIAGNOSIS — I352 Nonrheumatic aortic (valve) stenosis with insufficiency: Secondary | ICD-10-CM | POA: Diagnosis not present

## 2023-05-03 DIAGNOSIS — I350 Nonrheumatic aortic (valve) stenosis: Secondary | ICD-10-CM

## 2023-05-03 HISTORY — PX: LEFT HEART CATH AND CORONARY ANGIOGRAPHY: CATH118249

## 2023-05-03 LAB — BASIC METABOLIC PANEL
BUN/Creatinine Ratio: 19 (ref 10–24)
BUN: 21 mg/dL (ref 8–27)
CO2: 25 mmol/L (ref 20–29)
Calcium: 9.7 mg/dL (ref 8.6–10.2)
Chloride: 101 mmol/L (ref 96–106)
Creatinine, Ser: 1.09 mg/dL (ref 0.76–1.27)
Glucose: 131 mg/dL — ABNORMAL HIGH (ref 70–99)
Potassium: 4.4 mmol/L (ref 3.5–5.2)
Sodium: 140 mmol/L (ref 134–144)
eGFR: 67 mL/min/{1.73_m2} (ref >59)

## 2023-05-03 LAB — CBC
Hematocrit: 48.2 % (ref 37.5–51.0)
Hemoglobin: 15.9 g/dL (ref 13.0–17.7)
MCH: 29.4 pg (ref 26.6–33.0)
MCHC: 33 g/dL (ref 31.5–35.7)
MCV: 89 fL (ref 79–97)
Platelets: 161 10*3/uL (ref 150–450)
RBC: 5.41 x10E6/uL (ref 4.14–5.80)
RDW: 13.2 % (ref 11.6–15.4)
WBC: 6.7 10*3/uL (ref 3.4–10.8)

## 2023-05-03 LAB — GLUCOSE, CAPILLARY: Glucose-Capillary: 159 mg/dL — ABNORMAL HIGH (ref 70–99)

## 2023-05-03 SURGERY — LEFT HEART CATH AND CORONARY ANGIOGRAPHY
Anesthesia: LOCAL

## 2023-05-03 MED ORDER — HYDRALAZINE HCL 20 MG/ML IJ SOLN
INTRAMUSCULAR | Status: AC
  Filled 2023-05-03: qty 1

## 2023-05-03 MED ORDER — LABETALOL HCL 5 MG/ML IV SOLN
INTRAVENOUS | Status: DC | PRN
  Administered 2023-05-03: 20 mg via INTRAVENOUS

## 2023-05-03 MED ORDER — ONDANSETRON HCL 4 MG/2ML IJ SOLN: 4.0000 mg | Freq: Four times a day (QID) | INTRAMUSCULAR | Status: DC | PRN

## 2023-05-03 MED ORDER — LABETALOL HCL 5 MG/ML IV SOLN: 10.0000 mg | INTRAVENOUS | Status: DC | PRN

## 2023-05-03 MED ORDER — SODIUM CHLORIDE 0.9% FLUSH: 3.0000 mL | Freq: Two times a day (BID) | INTRAVENOUS | Status: DC

## 2023-05-03 MED ORDER — LIDOCAINE HCL (PF) 1 % IJ SOLN
INTRAMUSCULAR | Status: AC
  Filled 2023-05-03: qty 30

## 2023-05-03 MED ORDER — IOHEXOL 350 MG/ML SOLN
INTRAVENOUS | Status: DC | PRN
  Administered 2023-05-03: 141 mL

## 2023-05-03 MED ORDER — HEPARIN SODIUM (PORCINE) 1000 UNIT/ML IJ SOLN
INTRAMUSCULAR | Status: DC | PRN
  Administered 2023-05-03: 5000 [IU] via INTRAVENOUS

## 2023-05-03 MED ORDER — ASPIRIN 81 MG PO CHEW: 81.0000 mg | CHEWABLE_TABLET | ORAL | Status: DC

## 2023-05-03 MED ORDER — HEPARIN (PORCINE) IN NACL 1000-0.9 UT/500ML-% IV SOLN
INTRAVENOUS | Status: DC | PRN
  Administered 2023-05-03 (×2): 500 mL

## 2023-05-03 MED ORDER — MIDAZOLAM HCL 2 MG/2ML IJ SOLN
INTRAMUSCULAR | Status: AC
  Filled 2023-05-03: qty 2

## 2023-05-03 MED ORDER — MIDAZOLAM HCL 2 MG/2ML IJ SOLN
INTRAMUSCULAR | Status: DC | PRN
  Administered 2023-05-03: 1 mg via INTRAVENOUS

## 2023-05-03 MED ORDER — SODIUM CHLORIDE 0.9 % WEIGHT BASED INFUSION: 1.0000 mL/kg/h | INTRAVENOUS | Status: DC

## 2023-05-03 MED ORDER — HYDRALAZINE HCL 20 MG/ML IJ SOLN: 10.0000 mg | INTRAMUSCULAR | Status: DC | PRN

## 2023-05-03 MED ORDER — HEPARIN SODIUM (PORCINE) 1000 UNIT/ML IJ SOLN
INTRAMUSCULAR | Status: AC
  Filled 2023-05-03: qty 10

## 2023-05-03 MED ORDER — LABETALOL HCL 5 MG/ML IV SOLN
INTRAVENOUS | Status: AC
  Filled 2023-05-03: qty 4

## 2023-05-03 MED ORDER — ACETAMINOPHEN 325 MG PO TABS: 650.0000 mg | ORAL_TABLET | ORAL | Status: DC | PRN

## 2023-05-03 MED ORDER — LIDOCAINE HCL (PF) 1 % IJ SOLN
INTRAMUSCULAR | Status: DC | PRN
  Administered 2023-05-03: 2 mL

## 2023-05-03 MED ORDER — SODIUM CHLORIDE 0.9% FLUSH: 3.0000 mL | INTRAVENOUS | Status: DC | PRN

## 2023-05-03 MED ORDER — SODIUM CHLORIDE 0.9 % IV SOLN: 250.0000 mL | INTRAVENOUS | Status: DC | PRN

## 2023-05-03 MED ORDER — VERAPAMIL HCL 2.5 MG/ML IV SOLN
INTRAVENOUS | Status: DC | PRN
  Administered 2023-05-03: 10 mL via INTRA_ARTERIAL

## 2023-05-03 MED ORDER — VERAPAMIL HCL 2.5 MG/ML IV SOLN
INTRAVENOUS | Status: AC
  Filled 2023-05-03: qty 2

## 2023-05-03 MED ORDER — SODIUM CHLORIDE 0.9 % WEIGHT BASED INFUSION
3.0000 mL/kg/h | INTRAVENOUS | Status: AC
  Administered 2023-05-03: 3 mL/kg/h via INTRAVENOUS

## 2023-05-03 MED ORDER — HYDRALAZINE HCL 20 MG/ML IJ SOLN
INTRAMUSCULAR | Status: DC | PRN
  Administered 2023-05-03: 10 mg via INTRAVENOUS

## 2023-05-03 MED ORDER — FENTANYL CITRATE (PF) 100 MCG/2ML IJ SOLN
INTRAMUSCULAR | Status: DC | PRN
  Administered 2023-05-03: 25 ug via INTRAVENOUS

## 2023-05-03 MED ORDER — FENTANYL CITRATE (PF) 100 MCG/2ML IJ SOLN
INTRAMUSCULAR | Status: AC
  Filled 2023-05-03: qty 2

## 2023-05-03 SURGICAL SUPPLY — 12 items
CATH 5FR JL3.5 JR4 ANG PIG MP (CATHETERS) IMPLANT
CATH INFINITI 5 FR AR1 MOD (CATHETERS) IMPLANT
CATH INFINITI 5 FR RCB (CATHETERS) IMPLANT
CATH INFINITI 5FR AL1 (CATHETERS) IMPLANT
DEVICE RAD COMP TR BAND LRG (VASCULAR PRODUCTS) IMPLANT
GLIDESHEATH SLEND SS 6F .021 (SHEATH) IMPLANT
GUIDEWIRE ANGLED .035X150CM (WIRE) IMPLANT
GUIDEWIRE INQWIRE 1.5J.035X260 (WIRE) IMPLANT
INQWIRE 1.5J .035X260CM (WIRE) ×1
PACK CARDIAC CATHETERIZATION (CUSTOM PROCEDURE TRAY) ×1 IMPLANT
SET ATX-X65L (MISCELLANEOUS) IMPLANT
SHEATH 6FR 85 DEST SLENDER (SHEATH) IMPLANT

## 2023-05-03 NOTE — Interval H&P Note (Signed)
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  The various methods of treatment have been discussed with the patient and family. After consideration of risks, benefits and other options for treatment, the patient has consented to  Procedure(s): LEFT HEART CATH AND CORONARY ANGIOGRAPHY (N/A) as a surgical intervention.  The patient's history has been reviewed, patient examined, no change in status, stable for surgery.  I have reviewed the patient's chart and labs.  Questions were answered to the patient's satisfaction.     Tonny Bollman

## 2023-05-03 NOTE — Progress Notes (Signed)
TR BAND REMOVAL  LOCATION:    right radial  DEFLATED PER PROTOCOL:    Yes.    TIME BAND OFF / DRESSING APPLIED:    1915 gauze dressing applied   SITE UPON ARRIVAL:    Level 0  SITE AFTER BAND REMOVAL:    Level 0  CIRCULATION SENSATION AND MOVEMENT:    Within Normal Limits   Yes.    COMMENTS:   no issues noted

## 2023-05-03 NOTE — Discharge Instructions (Signed)

## 2023-05-06 ENCOUNTER — Encounter (HOSPITAL_COMMUNITY): Payer: Self-pay | Admitting: Cardiovascular Disease

## 2023-05-08 ENCOUNTER — Ambulatory Visit
Admission: RE | Admit: 2023-05-08 | Discharge: 2023-05-08 | Disposition: A | Discharge status: Home / Self Care | Payer: Medicare Other | Source: Ambulatory Visit | Attending: Neurology | Admitting: Neurology

## 2023-05-08 DIAGNOSIS — G459 Transient cerebral ischemic attack, unspecified: Secondary | ICD-10-CM | POA: Diagnosis not present

## 2023-05-08 MED ORDER — GADOPICLENOL 0.5 MMOL/ML IV SOLN
7.0000 mL | Freq: Once | INTRAVENOUS | Status: AC | PRN
  Administered 2023-05-08: 7 mL via INTRAVENOUS

## 2023-05-09 ENCOUNTER — Ambulatory Visit: Payer: Medicare Other | Admitting: Diagnostic Neuroimaging

## 2023-05-24 ENCOUNTER — Ambulatory Visit: Payer: Medicare Other | Admitting: Cardiology

## 2023-05-24 NOTE — Progress Notes (Signed)
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  This is likely clinically silent.  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  Continue current management.

## 2023-05-24 NOTE — Progress Notes (Signed)
Continue home the patient that MR angiogram study of the blood vessels in the neck show only minor plaque but no major blockages to worry about. MR angiogram study of the blood vessels in the brain also shows no significant blockages to worry about

## 2023-05-26 NOTE — Progress Notes (Deleted)
[address]       [place] [address]             [phone]           Bruce Nunez [place] Medical Record #[id number] Date of Birth: 05/15/1941  Baldo Daub, MD [place], [name]  Chief Complaint:     History of Present Illness:           Past Medical History:  Diagnosis Date   Anemia    recent iron supplement   Aortic regurgitation 03/12/2017   Aortic stenosis 04/29/2019   Asthma    childhood-   2013 bronchitis w/wheezing   Complication of anesthesia    " ether made me sick" - "woke up during colonoscopy"   Diabetes mellitus without complication (HCC)    Essential hypertension 03/12/2017   Frequency of urination    GERD (gastroesophageal reflux disease)    occasional   Heart murmur    Hemorrhoids    History of kidney stones    Hx of colon cancer, stage I    Hx of nonmelanoma skin cancer    Hx of peptic ulcer    as teenager   Hx of transfusion    Hyperlipidemia    Hypertension    Inguinal hernia    PONV (postoperative nausea and vomiting)    only when had Ether as Anesthesia drug   Prostate cancer (HCC) 08/15/2012   also Hx colon cancer / Hx skin cancer   Right inguinal hernia 05/08/2016   S/P right inguinal hernia repair Sept 2015 05/17/2014   Recurrent right indirect  Hernia treated with robotic TAPP repair with enterolysis Sept 2017   Stroke [facility]) 03/28/2022    Past Surgical History:  Procedure Laterality Date   cataract surgery  2009   CYSTOSCOPY     INGUINAL HERNIA REPAIR Right 05/17/2014   Procedure: RIGHT INGUINAL HERNIA REPAIR ;  Surgeon: Wenda Low, MD;  Location: [facility];  Service: General;  Laterality: Right;  With MESH   KNEE SURGERY Right 2012   LEFT HEART CATH AND CORONARY ANGIOGRAPHY N/A 05/03/2023   Procedure: LEFT HEART CATH AND CORONARY ANGIOGRAPHY;  Surgeon: Tonny Bollman, MD;  Location: [facility] INVASIVE CV LAB;  Service: Cardiovascular;  Laterality: N/A;   PARTIAL COLECTOMY  2005   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   RADIOACTIVE SEED IMPLANT N/A 11/24/2012   Procedure: RADIOACTIVE SEED IMPLANT;  Surgeon: Valetta Fuller, MD;  Location: [facility];  Service: Urology;  Laterality: N/A;   71seeds implanted  no seeds found in bladder   THROAT SURGERY  1940's   as child (age 70)growth that created fissure/corrective surgery   TONSILLECTOMY     age 35   XI ROBOTIC ASSISTED INGUINAL HERNIA REPAIR WITH MESH Right 05/08/2016   Procedure: XI ROBOTIC ASSISTED REPAIR OF RECURRENT RIGHT INGUINAL HERNIA;  Surgeon: Luretha Murphy, MD;  Location: [facility];  Service: General;  Laterality: Right;  With MESH    Social History   Tobacco Use  Smoking Status Never   Passive exposure: Never  Smokeless Tobacco Never    Social History   Substance and Sexual Activity  Alcohol Use No    Social History   Socioeconomic History   Marital status: Married    Spouse name: Not on file   Number of children: 2   Years of education: Not on file   Highest education level: Not on file  Occupational  History   Occupation: Retired    Comment: Designer, fashion/clothing  Tobacco Use   Smoking status: Never    Passive exposure: Never   Smokeless tobacco: Never  Vaping Use   Vaping status: Never Used  Substance and Sexual Activity   Alcohol use: No   Drug use: No   Sexual activity: Not on file  Other Topics Concern   Not on file  Social History Narrative   Not on file   Social Determinants of Health   Financial Resource Strain: Not on file  Food Insecurity: Not on file  Transportation Needs: Not on file  Physical Activity: Not on file  Stress: Not on file  Social Connections: Not on file  Intimate Partner Violence: Not on file    Allergies  Allergen Reactions   Penicillins Anaphylaxis, Shortness Of Breath, Itching and Swelling        Iodinated Contrast Media Hives and Rash   Aspirin Other (See Comments)    GI bleeding-avoids    Monodox [Doxycycline Hyclate] Nausea Only    "made me  feel sick"   Sulfa Antibiotics Itching and Swelling   Tramadol Nausea And Vomiting    Current Outpatient Medications  Medication Sig Dispense Refill   albuterol (PROVENTIL HFA;VENTOLIN HFA) 108 (90 BASE) MCG/ACT inhaler Inhale 2 puffs into the lungs See admin instructions. Inhale 2 puffs every night, may inhale 2 puffs every 6 hours as needed for shortness of breath     bisoprolol-hydrochlorothiazide (ZIAC) 10-6.25 MG per tablet Take 1 tablet by mouth every morning.      budesonide-formoterol (SYMBICORT) 160-4.5 MCG/ACT inhaler Inhale 2 puffs into the lungs 2 (two) times daily.     clopidogrel (PLAVIX) 75 MG tablet Take 1 tablet (75 mg total) by mouth daily. 30 tablet 11   cyanocobalamin (,VITAMIN B-12,) 1000 MCG/ML injection Inject 1 mL into the skin every 30 (thirty) days.     dicyclomine (BENTYL) 10 MG capsule Take 10 mg by mouth 2 (two) times daily as needed for cramping.     fexofenadine (ALLEGRA) 180 MG tablet Take 180 mg by mouth daily.     fluticasone (FLONASE) 50 MCG/ACT nasal spray Place 1 spray into both nostrils daily.      furosemide (LASIX) 20 MG tablet Take 10-20 mg by mouth daily as needed for fluid or edema.     glimepiride (AMARYL) 2 MG tablet Take 2 mg by mouth daily.     isosorbide mononitrate (IMDUR) 30 MG 24 hr tablet Take 1 tablet (30 mg total) by mouth daily. 90 tablet 1   levothyroxine (SYNTHROID) 75 MCG tablet Take 75 mcg by mouth daily before breakfast.     Multiple Vitamin (MULTIVITAMIN WITH MINERALS) TABS tablet Take 1 tablet by mouth daily.     nitroGLYCERIN (NITROSTAT) 0.4 MG SL tablet DISSOLVE 1 TABLET UNDER THE TONGUE EVERY 5 MINUTES AS NEEDED FOR CHEST PAIN. DO NOT EXCEED A TOTAL OF 3 DOSES IN 15 MINUTES. 25 tablet 3   omeprazole (PRILOSEC) 20 MG capsule Take 20 mg by mouth daily.     predniSONE (DELTASONE) 50 MG tablet Prednisone 50 mg - take 13 hours prior to test Take another Prednisone 50 mg 7 hours prior to test Take another Prednisone 50 mg 1 hour prior  to test Take Benadryl 50 mg 1 hour prior to test Patient must complete all four doses of above prophylactic medications. Patient will need a ride after test due to Benadryl. 3 tablet 0   rosuvastatin (CRESTOR) 20 MG tablet  Take 1 tablet (20 mg total) by mouth daily. 90 tablet 1   solifenacin (VESICARE) 10 MG tablet Take 10 mg by mouth daily.     Tamsulosin HCl (FLOMAX) 0.4 MG CAPS Take 0.4 mg by mouth 2 (two) times daily.      No current facility-administered medications for this visit.     Family History  Problem Relation Age of Onset   Heart attack Other    Brain cancer Other    Coronary artery disease Other    Prostate cancer Other        Physical Exam:      Diagnostic Studies & Laboratory data: I have personally reviewed the following studies and agree with the findings   TTE (06/2022) IMPRESSIONS     1. GLS -12.9. Left ventricular ejection fraction, by estimation, is 60 to  65%. The left ventricle has normal function. The left ventricle has no  regional wall motion abnormalities. Left ventricular diastolic parameters  are consistent with Grade I  diastolic dysfunction (impaired relaxation).   2. Right ventricular systolic function is normal. The right ventricular  size is normal. There is normal pulmonary artery systolic pressure.   3. The mitral valve is normal in structure. No evidence of mitral valve  regurgitation. No evidence of mitral stenosis.   4. EF still 60-65%, normal LV size. The aortic valve is calcified. There  is moderate calcification of the aortic valve. There is mild thickening of  the aortic valve. Aortic valve regurgitation is moderate to severe. Mild  to moderate aortic valve  stenosis. Aortic valve mean gradient measures 14.0 mmHg.   5. The inferior vena cava is normal in size with greater than 50%  respiratory variability, suggesting right atrial pressure of 3 mmHg.   FINDINGS   Left Ventricle: GLS -12.9. Left ventricular ejection fraction,  by  estimation, is 60 to 65%. The left ventricle has normal function. The left  ventricle has no regional wall motion abnormalities. The left ventricular  internal cavity size was normal in  size. There is no left ventricular hypertrophy. Left ventricular diastolic  parameters are consistent with Grade I diastolic dysfunction (impaired  relaxation).   Right Ventricle: The right ventricular size is normal. No increase in  right ventricular wall thickness. Right ventricular systolic function is  normal. There is normal pulmonary artery systolic pressure. The tricuspid  regurgitant velocity is 2.55 m/s, and   with an assumed right atrial pressure of 3 mmHg, the estimated right  ventricular systolic pressure is 29.0 mmHg.   Left Atrium: Left atrial size was normal in size.   Right Atrium: Right atrial size was normal in size.   Pericardium: There is no evidence of pericardial effusion.   Mitral Valve: The mitral valve is normal in structure. No evidence of  mitral valve regurgitation. No evidence of mitral valve stenosis.   Tricuspid Valve: The tricuspid valve is normal in structure. Tricuspid  valve regurgitation is mild . No evidence of tricuspid stenosis.   Aortic Valve: EF still 60-65%, normal LV size. The aortic valve is  calcified. There is moderate calcification of the aortic valve. There is  mild thickening of the aortic valve. Aortic valve regurgitation is  moderate to severe. Aortic regurgitation PHT  measures 314 msec. Mild to moderate aortic stenosis is present. Aortic  valve mean gradient measures 14.0 mmHg. Aortic valve peak gradient  measures 26.1 mmHg. Aortic valve area, by VTI measures 1.11 cm.   Pulmonic Valve: The pulmonic valve was normal  in structure. Pulmonic valve  regurgitation is not visualized. No evidence of pulmonic stenosis.   Aorta: The aortic root is normal in size and structure.   Venous: The inferior vena cava is normal in size with greater than  50%  respiratory variability, suggesting right atrial pressure of 3 mmHg.   IAS/Shunts: No atrial level shunt detected by color flow Doppler.     LEFT VENTRICLE  PLAX 2D  LVIDd:         4.30 cm   Diastology  LVIDs:         2.50 cm   LV e' medial:    4.03 cm/s  LV PW:         1.00 cm   LV E/e' medial:  19.0  LV IVS:        1.40 cm   LV e' lateral:   7.29 cm/s  LVOT diam:     1.80 cm   LV E/e' lateral: 10.5  LV SV:         58  LV SV Index:   32  LVOT Area:     2.54 cm     RIGHT VENTRICLE             IVC  RV S prime:     10.30 cm/s  IVC diam: 1.20 cm  TAPSE (M-mode): 2.2 cm   LEFT ATRIUM             Index        RIGHT ATRIUM          Index  LA diam:        3.50 cm 1.95 cm/m   RA Area:     9.45 cm  LA Vol (A2C):   48.2 ml 26.84 ml/m  RA Volume:   17.10 ml 9.52 ml/m  LA Vol (A4C):   54.0 ml 30.07 ml/m  LA Biplane Vol: 51.6 ml 28.74 ml/m   AORTIC VALVE  AV Area (Vmax):    0.99 cm  AV Area (Vmean):   1.01 cm  AV Area (VTI):     1.11 cm  AV Vmax:           255.50 cm/s  AV Vmean:          168.500 cm/s  AV VTI:            0.525 m  AV Peak Grad:      26.1 mmHg  AV Mean Grad:      14.0 mmHg  LVOT Vmax:         99.80 cm/s  LVOT Vmean:        66.800 cm/s  LVOT VTI:          0.228 m  LVOT/AV VTI ratio: 0.43  AI PHT:            314 msec    AORTA  Ao Root diam: 2.30 cm  Ao Asc diam:  3.00 cm   MITRAL VALVE               TRICUSPID VALVE  MV Area (PHT): 3.74 cm    TR Peak grad:   26.0 mmHg  MV Decel Time: 203 msec    TR Vmax:        255.00 cm/s  MV E velocity: 76.70 cm/s  MV A velocity: 83.60 cm/s  SHUNTS  MV E/A ratio:  0.92        Systemic VTI:  0.23 m  Systemic Diam: 1.80 cm   CATH (04/2023) Conclusion      Prox RCA to Mid RCA lesion is 50% stenosed.   Mid LM to Dist LM lesion is 30% stenosed.   Prox Cx to Dist Cx lesion is 50% stenosed.   Prox LAD to Mid LAD lesion is 50% stenosed.   1st Diag lesion is 60% stenosed.   There is  severe (4+) aortic regurgitation.   1.  Moderate multivessel coronary artery disease with mild distal left main plaquing of 30%, moderate diffuse LAD stenosis of 50%, mild to moderate mid circumflex stenosis of 40 to 50%, and moderate mid RCA stenosis of 50%. 2.  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  Severe right subclavian tortuosity makes cardiac catheterization very difficult from right radial access.  If patient requires future cardiac catheterization, avoid right radial access.  Recent Radiology Findings:       Recent Lab Findings: Lab Results  Component Value Date   WBC 6.7 05/02/2023   HGB 15.9 05/02/2023   HCT 48.2 05/02/2023   PLT 161 05/02/2023   GLUCOSE 131 (H) 05/02/2023   CHOL 95 (L) 03/21/2023   TRIG 137 03/21/2023   HDL 32 (L) 03/21/2023   LDLCALC 39 03/21/2023   ALT 25 12/09/2019   AST 25 12/09/2019   NA 140 05/02/2023   K 4.4 05/02/2023   CL 101 05/02/2023   CREATININE 1.09 05/02/2023   BUN 21 05/02/2023   CO2 25 05/02/2023   TSH 2.620 03/21/2023   INR 0.90 11/17/2012   HGBA1C 6.6 (H) 03/21/2023      Assessment / Plan:        I have spent *** min in review of the records, viewing studies and in face to face with patient and in coordination of future care    [name] 05/26/2023 6:41 PM

## 2023-05-27 ENCOUNTER — Encounter: Payer: Medicare Other | Admitting: Thoracic Surgery (Cardiothoracic Vascular Surgery)

## 2023-05-27 ENCOUNTER — Telehealth: Payer: Self-pay | Admitting: Neurology

## 2023-05-27 NOTE — Telephone Encounter (Signed)
Called the pt to review the results from the MRI brain and MRA neck. There was no answer. LVM asking for a call back to review.  Per Dr Pearlean Brownie,  "inform the patient that MRI scan of the brain shows a very tiny infarct in the deep portion of the brain on the left side.  This is likely clinically silent.  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  Continue current management."

## 2023-05-27 NOTE — Telephone Encounter (Signed)
-----   Message from Delia Heady sent at 05/24/2023  4:48 PM EDT ----- Continue home the patient that MR angiogram study of the blood vessels in the neck show only minor plaque but no major blockages to worry about. MR angiogram study of the blood vessels in the brain also shows no significant blockages to worry about

## 2023-06-01 NOTE — Progress Notes (Unsigned)
Diag lesion is 60% stenosed.   There is severe (4+) aortic regurgitation.  1.  Moderate multivessel coronary artery disease with mild distal left main plaquing of 30%, moderate diffuse LAD stenosis of 50%, mild to moderate mid circumflex stenosis of 40 to 50%, and moderate mid RCA stenosis of 50%. 2.  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  Severe right subclavian tortuosity makes cardiac catheterization very difficult from right radial access.  If patient requires future cardiac catheterization, avoid right radial access.  Recommendations: Heart team review, cardiac CTA (TAVR protocol) to evaluate if there is enough aortic valve calcification to perform TAVR as aortic insufficiency appears to be the primary valve lesion.  The patient has a very wide pulse pressure and angiographically has severe AI.  Findings Coronary Findings Diagnostic  Dominance: Right  Left Main Mid LM to Dist LM lesion is 30% stenosed.  Left Anterior Descending The LAD has moderate diffuse 50% stenosis throughout the proximal and mid vessel.  The first diagonal has a 50 to 60%  ostial stenosis. Prox LAD to Mid LAD lesion is 50% stenosed.  First Diagonal Branch 1st Diag lesion is 60% stenosed.  Left Circumflex There is mild diffuse disease throughout the vessel. Prox Cx to Dist Cx lesion is 50% stenosed. The lesion is moderately calcified.  Right Coronary Artery There is mild diffuse disease throughout the vessel. Dominant vessel with no high-grade CAD.  There is moderate diffuse calcification present.  The mid RCA has a 50% stenosis.  The PDA and PLA branches are patent with no significant stenoses.  The distal vessel has mild diffuse plaquing with no stenosis. Prox RCA to Mid RCA lesion is 50% stenosed. The lesion is moderately calcified.  Intervention  No interventions have been documented.     ECHOCARDIOGRAM  ECHOCARDIOGRAM COMPLETE 07/11/2022  Narrative ECHOCARDIOGRAM REPORT    Patient Name:   Bruce Nunez Date of Exam: 07/11/2022 Medical Rec #:  [id number]     Height:       67.0 in Accession #:    [id number]    Weight:       151.2 lb Date of Birth:  08-02-1941      BSA:          1.796 m Patient Age:    82 years      BP:           146/54 mmHg Patient Gender: M             HR:           63 bpm. 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 Risk Factors:Hypertension and Dyslipidemia.  Sonographer:    Louie Boston RDCS Referring Phys: [id number] Gauri Galvao J Othal Kubitz  IMPRESSIONS   1. GLS -12.9. Left ventricular ejection fraction, by estimation, is 60 to 65%. The left ventricle has normal function. The left ventricle has no regional wall motion abnormalities. Left ventricular diastolic parameters are consistent with Grade I diastolic dysfunction (impaired relaxation). 2.  Right ventricular systolic function is normal. The right ventricular size is normal. There is normal pulmonary artery systolic pressure. 3. The mitral valve is normal in structure. No evidence of mitral valve regurgitation. No evidence of mitral stenosis. 4. EF still 60-65%,  Diag lesion is 60% stenosed.   There is severe (4+) aortic regurgitation.  1.  Moderate multivessel coronary artery disease with mild distal left main plaquing of 30%, moderate diffuse LAD stenosis of 50%, mild to moderate mid circumflex stenosis of 40 to 50%, and moderate mid RCA stenosis of 50%. 2.  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  Severe right subclavian tortuosity makes cardiac catheterization very difficult from right radial access.  If patient requires future cardiac catheterization, avoid right radial access.  Recommendations: Heart team review, cardiac CTA (TAVR protocol) to evaluate if there is enough aortic valve calcification to perform TAVR as aortic insufficiency appears to be the primary valve lesion.  The patient has a very wide pulse pressure and angiographically has severe AI.  Findings Coronary Findings Diagnostic  Dominance: Right  Left Main Mid LM to Dist LM lesion is 30% stenosed.  Left Anterior Descending The LAD has moderate diffuse 50% stenosis throughout the proximal and mid vessel.  The first diagonal has a 50 to 60%  ostial stenosis. Prox LAD to Mid LAD lesion is 50% stenosed.  First Diagonal Branch 1st Diag lesion is 60% stenosed.  Left Circumflex There is mild diffuse disease throughout the vessel. Prox Cx to Dist Cx lesion is 50% stenosed. The lesion is moderately calcified.  Right Coronary Artery There is mild diffuse disease throughout the vessel. Dominant vessel with no high-grade CAD.  There is moderate diffuse calcification present.  The mid RCA has a 50% stenosis.  The PDA and PLA branches are patent with no significant stenoses.  The distal vessel has mild diffuse plaquing with no stenosis. Prox RCA to Mid RCA lesion is 50% stenosed. The lesion is moderately calcified.  Intervention  No interventions have been documented.     ECHOCARDIOGRAM  ECHOCARDIOGRAM COMPLETE 07/11/2022  Narrative ECHOCARDIOGRAM REPORT    Patient Name:   Bruce Nunez Date of Exam: 07/11/2022 Medical Rec #:  [id number]     Height:       67.0 in Accession #:    [id number]    Weight:       151.2 lb Date of Birth:  08-02-1941      BSA:          1.796 m Patient Age:    82 years      BP:           146/54 mmHg Patient Gender: M             HR:           63 bpm. 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 Risk Factors:Hypertension and Dyslipidemia.  Sonographer:    Louie Boston RDCS Referring Phys: [id number] Gauri Galvao J Othal Kubitz  IMPRESSIONS   1. GLS -12.9. Left ventricular ejection fraction, by estimation, is 60 to 65%. The left ventricle has normal function. The left ventricle has no regional wall motion abnormalities. Left ventricular diastolic parameters are consistent with Grade I diastolic dysfunction (impaired relaxation). 2.  Right ventricular systolic function is normal. The right ventricular size is normal. There is normal pulmonary artery systolic pressure. 3. The mitral valve is normal in structure. No evidence of mitral valve regurgitation. No evidence of mitral stenosis. 4. EF still 60-65%,  Diag lesion is 60% stenosed.   There is severe (4+) aortic regurgitation.  1.  Moderate multivessel coronary artery disease with mild distal left main plaquing of 30%, moderate diffuse LAD stenosis of 50%, mild to moderate mid circumflex stenosis of 40 to 50%, and moderate mid RCA stenosis of 50%. 2.  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  Severe right subclavian tortuosity makes cardiac catheterization very difficult from right radial access.  If patient requires future cardiac catheterization, avoid right radial access.  Recommendations: Heart team review, cardiac CTA (TAVR protocol) to evaluate if there is enough aortic valve calcification to perform TAVR as aortic insufficiency appears to be the primary valve lesion.  The patient has a very wide pulse pressure and angiographically has severe AI.  Findings Coronary Findings Diagnostic  Dominance: Right  Left Main Mid LM to Dist LM lesion is 30% stenosed.  Left Anterior Descending The LAD has moderate diffuse 50% stenosis throughout the proximal and mid vessel.  The first diagonal has a 50 to 60%  ostial stenosis. Prox LAD to Mid LAD lesion is 50% stenosed.  First Diagonal Branch 1st Diag lesion is 60% stenosed.  Left Circumflex There is mild diffuse disease throughout the vessel. Prox Cx to Dist Cx lesion is 50% stenosed. The lesion is moderately calcified.  Right Coronary Artery There is mild diffuse disease throughout the vessel. Dominant vessel with no high-grade CAD.  There is moderate diffuse calcification present.  The mid RCA has a 50% stenosis.  The PDA and PLA branches are patent with no significant stenoses.  The distal vessel has mild diffuse plaquing with no stenosis. Prox RCA to Mid RCA lesion is 50% stenosed. The lesion is moderately calcified.  Intervention  No interventions have been documented.     ECHOCARDIOGRAM  ECHOCARDIOGRAM COMPLETE 07/11/2022  Narrative ECHOCARDIOGRAM REPORT    Patient Name:   Bruce Nunez Date of Exam: 07/11/2022 Medical Rec #:  [id number]     Height:       67.0 in Accession #:    [id number]    Weight:       151.2 lb Date of Birth:  08-02-1941      BSA:          1.796 m Patient Age:    82 years      BP:           146/54 mmHg Patient Gender: M             HR:           63 bpm. 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 Risk Factors:Hypertension and Dyslipidemia.  Sonographer:    Louie Boston RDCS Referring Phys: [id number] Gauri Galvao J Othal Kubitz  IMPRESSIONS   1. GLS -12.9. Left ventricular ejection fraction, by estimation, is 60 to 65%. The left ventricle has normal function. The left ventricle has no regional wall motion abnormalities. Left ventricular diastolic parameters are consistent with Grade I diastolic dysfunction (impaired relaxation). 2.  Right ventricular systolic function is normal. The right ventricular size is normal. There is normal pulmonary artery systolic pressure. 3. The mitral valve is normal in structure. No evidence of mitral valve regurgitation. No evidence of mitral stenosis. 4. EF still 60-65%,  Cardiology Office Note:    Date:  06/03/2023   ID:  ERIC NEES, DOB [birth year], MRN [id number]  PCP:  Gus Height, PA  Cardiologist:  Norman Herrlich, MD    Referring MD: Gus Height, [name]    ASSESSMENT:    1. Aortic valve insufficiency, etiology of cardiac valve disease unspecified   2. Aortic valve stenosis, etiology of cardiac valve disease unspecified   3. Coronary artery disease of native artery of native heart with stable angina pectoris (HCC)   4. Essential hypertension   5. 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  Concerns regarding medicines are outlined above.  No orders of the defined types were placed in this encounter.  No orders of the defined types were placed in this encounter.    History of Present Illness:    Bruce Nunez is a 82 y.o. male with a hx of aortic insufficiency aortic stenosis hypertension hyperlipidemia chest pain and CAD last seen 04/12/2023.  When seen by me I was bothered by the discrepancy between physical examination and cardiac imaging concerned he had more severe aortic stenosis or CAD and referred the patient to undergo left and right heart catheterization.  On that visit cardiac CTA was performed reported 04/30/2023 with a coronary calcium score severely elevated 812 plaque volume of 40th percentile mild to moderate  CAD 25 to 49% distal left main proximal to mid right coronary artery ostial to proximal left circumflex and diffusely throughout the right coronary artery with calcified plaques greater than 50%.  He subsequently underwent left heart catheterization which showed multiple moderate coronary artery stenoses left main 30% LAD 50% left circumflex 40 to 50% right coronary artery 50% however aortography showed the presence of some beer aortic insufficiency.  He was referred to heart team for further evaluation.  Interestingly cardiac MR performed in July 2023 showed moderate aortic regurgitation with regurgitant fraction of 37%.  Compliance with diet, lifestyle and medications: Yes  He is understandably apprehensive awaiting evaluation with CT surgery in a few days hopeful he is a candidate for TAVR for his aortic regurgitation He is a little bit more symptomatic if he tries to do more than minor activities he gets fullness to the chest anginal rest and he is resolved. He is not having edema shortness of breath palpitation or syncope Past Medical History:  Diagnosis Date   Anemia    recent iron supplement   Aortic regurgitation 03/12/2017   Aortic stenosis 04/29/2019   Asthma    childhood-   2013 bronchitis w/wheezing   Complication of anesthesia    " ether made me sick" - "woke up during colonoscopy"   Diabetes mellitus without complication (HCC)    Essential hypertension 03/12/2017   Frequency of urination    GERD (gastroesophageal reflux disease)    occasional   Heart murmur    Hemorrhoids    History of kidney stones  Cardiology Office Note:    Date:  06/03/2023   ID:  ERIC NEES, DOB [birth year], MRN [id number]  PCP:  Gus Height, PA  Cardiologist:  Norman Herrlich, MD    Referring MD: Gus Height, [name]    ASSESSMENT:    1. Aortic valve insufficiency, etiology of cardiac valve disease unspecified   2. Aortic valve stenosis, etiology of cardiac valve disease unspecified   3. Coronary artery disease of native artery of native heart with stable angina pectoris (HCC)   4. Essential hypertension   5. 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  Concerns regarding medicines are outlined above.  No orders of the defined types were placed in this encounter.  No orders of the defined types were placed in this encounter.    History of Present Illness:    Bruce Nunez is a 82 y.o. male with a hx of aortic insufficiency aortic stenosis hypertension hyperlipidemia chest pain and CAD last seen 04/12/2023.  When seen by me I was bothered by the discrepancy between physical examination and cardiac imaging concerned he had more severe aortic stenosis or CAD and referred the patient to undergo left and right heart catheterization.  On that visit cardiac CTA was performed reported 04/30/2023 with a coronary calcium score severely elevated 812 plaque volume of 40th percentile mild to moderate  CAD 25 to 49% distal left main proximal to mid right coronary artery ostial to proximal left circumflex and diffusely throughout the right coronary artery with calcified plaques greater than 50%.  He subsequently underwent left heart catheterization which showed multiple moderate coronary artery stenoses left main 30% LAD 50% left circumflex 40 to 50% right coronary artery 50% however aortography showed the presence of some beer aortic insufficiency.  He was referred to heart team for further evaluation.  Interestingly cardiac MR performed in July 2023 showed moderate aortic regurgitation with regurgitant fraction of 37%.  Compliance with diet, lifestyle and medications: Yes  He is understandably apprehensive awaiting evaluation with CT surgery in a few days hopeful he is a candidate for TAVR for his aortic regurgitation He is a little bit more symptomatic if he tries to do more than minor activities he gets fullness to the chest anginal rest and he is resolved. He is not having edema shortness of breath palpitation or syncope Past Medical History:  Diagnosis Date   Anemia    recent iron supplement   Aortic regurgitation 03/12/2017   Aortic stenosis 04/29/2019   Asthma    childhood-   2013 bronchitis w/wheezing   Complication of anesthesia    " ether made me sick" - "woke up during colonoscopy"   Diabetes mellitus without complication (HCC)    Essential hypertension 03/12/2017   Frequency of urination    GERD (gastroesophageal reflux disease)    occasional   Heart murmur    Hemorrhoids    History of kidney stones  Cardiology Office Note:    Date:  06/03/2023   ID:  ERIC NEES, DOB [birth year], MRN [id number]  PCP:  Gus Height, PA  Cardiologist:  Norman Herrlich, MD    Referring MD: Gus Height, [name]    ASSESSMENT:    1. Aortic valve insufficiency, etiology of cardiac valve disease unspecified   2. Aortic valve stenosis, etiology of cardiac valve disease unspecified   3. Coronary artery disease of native artery of native heart with stable angina pectoris (HCC)   4. Essential hypertension   5. 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  Concerns regarding medicines are outlined above.  No orders of the defined types were placed in this encounter.  No orders of the defined types were placed in this encounter.    History of Present Illness:    Bruce Nunez is a 82 y.o. male with a hx of aortic insufficiency aortic stenosis hypertension hyperlipidemia chest pain and CAD last seen 04/12/2023.  When seen by me I was bothered by the discrepancy between physical examination and cardiac imaging concerned he had more severe aortic stenosis or CAD and referred the patient to undergo left and right heart catheterization.  On that visit cardiac CTA was performed reported 04/30/2023 with a coronary calcium score severely elevated 812 plaque volume of 40th percentile mild to moderate  CAD 25 to 49% distal left main proximal to mid right coronary artery ostial to proximal left circumflex and diffusely throughout the right coronary artery with calcified plaques greater than 50%.  He subsequently underwent left heart catheterization which showed multiple moderate coronary artery stenoses left main 30% LAD 50% left circumflex 40 to 50% right coronary artery 50% however aortography showed the presence of some beer aortic insufficiency.  He was referred to heart team for further evaluation.  Interestingly cardiac MR performed in July 2023 showed moderate aortic regurgitation with regurgitant fraction of 37%.  Compliance with diet, lifestyle and medications: Yes  He is understandably apprehensive awaiting evaluation with CT surgery in a few days hopeful he is a candidate for TAVR for his aortic regurgitation He is a little bit more symptomatic if he tries to do more than minor activities he gets fullness to the chest anginal rest and he is resolved. He is not having edema shortness of breath palpitation or syncope Past Medical History:  Diagnosis Date   Anemia    recent iron supplement   Aortic regurgitation 03/12/2017   Aortic stenosis 04/29/2019   Asthma    childhood-   2013 bronchitis w/wheezing   Complication of anesthesia    " ether made me sick" - "woke up during colonoscopy"   Diabetes mellitus without complication (HCC)    Essential hypertension 03/12/2017   Frequency of urination    GERD (gastroesophageal reflux disease)    occasional   Heart murmur    Hemorrhoids    History of kidney stones  Diag lesion is 60% stenosed.   There is severe (4+) aortic regurgitation.  1.  Moderate multivessel coronary artery disease with mild distal left main plaquing of 30%, moderate diffuse LAD stenosis of 50%, mild to moderate mid circumflex stenosis of 40 to 50%, and moderate mid RCA stenosis of 50%. 2.  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  Severe right subclavian tortuosity makes cardiac catheterization very difficult from right radial access.  If patient requires future cardiac catheterization, avoid right radial access.  Recommendations: Heart team review, cardiac CTA (TAVR protocol) to evaluate if there is enough aortic valve calcification to perform TAVR as aortic insufficiency appears to be the primary valve lesion.  The patient has a very wide pulse pressure and angiographically has severe AI.  Findings Coronary Findings Diagnostic  Dominance: Right  Left Main Mid LM to Dist LM lesion is 30% stenosed.  Left Anterior Descending The LAD has moderate diffuse 50% stenosis throughout the proximal and mid vessel.  The first diagonal has a 50 to 60%  ostial stenosis. Prox LAD to Mid LAD lesion is 50% stenosed.  First Diagonal Branch 1st Diag lesion is 60% stenosed.  Left Circumflex There is mild diffuse disease throughout the vessel. Prox Cx to Dist Cx lesion is 50% stenosed. The lesion is moderately calcified.  Right Coronary Artery There is mild diffuse disease throughout the vessel. Dominant vessel with no high-grade CAD.  There is moderate diffuse calcification present.  The mid RCA has a 50% stenosis.  The PDA and PLA branches are patent with no significant stenoses.  The distal vessel has mild diffuse plaquing with no stenosis. Prox RCA to Mid RCA lesion is 50% stenosed. The lesion is moderately calcified.  Intervention  No interventions have been documented.     ECHOCARDIOGRAM  ECHOCARDIOGRAM COMPLETE 07/11/2022  Narrative ECHOCARDIOGRAM REPORT    Patient Name:   Bruce Nunez Date of Exam: 07/11/2022 Medical Rec #:  [id number]     Height:       67.0 in Accession #:    [id number]    Weight:       151.2 lb Date of Birth:  08-02-1941      BSA:          1.796 m Patient Age:    82 years      BP:           146/54 mmHg Patient Gender: M             HR:           63 bpm. 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 Risk Factors:Hypertension and Dyslipidemia.  Sonographer:    Louie Boston RDCS Referring Phys: [id number] Gauri Galvao J Othal Kubitz  IMPRESSIONS   1. GLS -12.9. Left ventricular ejection fraction, by estimation, is 60 to 65%. The left ventricle has normal function. The left ventricle has no regional wall motion abnormalities. Left ventricular diastolic parameters are consistent with Grade I diastolic dysfunction (impaired relaxation). 2.  Right ventricular systolic function is normal. The right ventricular size is normal. There is normal pulmonary artery systolic pressure. 3. The mitral valve is normal in structure. No evidence of mitral valve regurgitation. No evidence of mitral stenosis. 4. EF still 60-65%,  Cardiology Office Note:    Date:  06/03/2023   ID:  ERIC NEES, DOB [birth year], MRN [id number]  PCP:  Gus Height, PA  Cardiologist:  Norman Herrlich, MD    Referring MD: Gus Height, [name]    ASSESSMENT:    1. Aortic valve insufficiency, etiology of cardiac valve disease unspecified   2. Aortic valve stenosis, etiology of cardiac valve disease unspecified   3. Coronary artery disease of native artery of native heart with stable angina pectoris (HCC)   4. Essential hypertension   5. 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  Concerns regarding medicines are outlined above.  No orders of the defined types were placed in this encounter.  No orders of the defined types were placed in this encounter.    History of Present Illness:    Bruce Nunez is a 82 y.o. male with a hx of aortic insufficiency aortic stenosis hypertension hyperlipidemia chest pain and CAD last seen 04/12/2023.  When seen by me I was bothered by the discrepancy between physical examination and cardiac imaging concerned he had more severe aortic stenosis or CAD and referred the patient to undergo left and right heart catheterization.  On that visit cardiac CTA was performed reported 04/30/2023 with a coronary calcium score severely elevated 812 plaque volume of 40th percentile mild to moderate  CAD 25 to 49% distal left main proximal to mid right coronary artery ostial to proximal left circumflex and diffusely throughout the right coronary artery with calcified plaques greater than 50%.  He subsequently underwent left heart catheterization which showed multiple moderate coronary artery stenoses left main 30% LAD 50% left circumflex 40 to 50% right coronary artery 50% however aortography showed the presence of some beer aortic insufficiency.  He was referred to heart team for further evaluation.  Interestingly cardiac MR performed in July 2023 showed moderate aortic regurgitation with regurgitant fraction of 37%.  Compliance with diet, lifestyle and medications: Yes  He is understandably apprehensive awaiting evaluation with CT surgery in a few days hopeful he is a candidate for TAVR for his aortic regurgitation He is a little bit more symptomatic if he tries to do more than minor activities he gets fullness to the chest anginal rest and he is resolved. He is not having edema shortness of breath palpitation or syncope Past Medical History:  Diagnosis Date   Anemia    recent iron supplement   Aortic regurgitation 03/12/2017   Aortic stenosis 04/29/2019   Asthma    childhood-   2013 bronchitis w/wheezing   Complication of anesthesia    " ether made me sick" - "woke up during colonoscopy"   Diabetes mellitus without complication (HCC)    Essential hypertension 03/12/2017   Frequency of urination    GERD (gastroesophageal reflux disease)    occasional   Heart murmur    Hemorrhoids    History of kidney stones

## 2023-06-03 ENCOUNTER — Encounter: Payer: Self-pay | Admitting: Cardiology

## 2023-06-03 ENCOUNTER — Ambulatory Visit: Payer: Medicare Other | Attending: Cardiology | Admitting: Cardiology

## 2023-06-03 VITALS — BP 134/60 | HR 63 | Ht 66.0 in | Wt 147.6 lb

## 2023-06-03 DIAGNOSIS — I350 Nonrheumatic aortic (valve) stenosis: Secondary | ICD-10-CM | POA: Diagnosis not present

## 2023-06-03 DIAGNOSIS — I351 Nonrheumatic aortic (valve) insufficiency: Secondary | ICD-10-CM

## 2023-06-03 DIAGNOSIS — I10 Essential (primary) hypertension: Secondary | ICD-10-CM | POA: Diagnosis not present

## 2023-06-03 DIAGNOSIS — E782 Mixed hyperlipidemia: Secondary | ICD-10-CM

## 2023-06-03 DIAGNOSIS — I25118 Atherosclerotic heart disease of native coronary artery with other forms of angina pectoris: Secondary | ICD-10-CM

## 2023-06-03 NOTE — Patient Instructions (Signed)
Medication Instructions:  Your physician recommends that you continue on your current medications as directed. Please refer to the Current Medication list given to you today.  *If you need a refill on your cardiac medications before your next appointment, please call your pharmacy*   Lab Work: None If you have labs (blood work) drawn today and your tests are completely normal, you will receive your results only by: MyChart Message (if you have MyChart) OR A paper copy in the mail If you have any lab test that is abnormal or we need to change your treatment, we will call you to review the results.   Testing/Procedures: None   Follow-Up: At [facility], you and your health needs are our priority.  As part of our continuing mission to provide you with exceptional heart care, we have created designated Provider Care Teams.  These Care Teams include your primary Cardiologist (physician) and Advanced Practice Providers (APPs -  Physician Assistants and Nurse Practitioners) who all work together to provide you with the care you need, when you need it.  We recommend signing up for the patient portal called "MyChart".  Sign up information is provided on this After Visit Summary.  MyChart is used to connect with patients for Virtual Visits (Telemedicine).  Patients are able to view lab/test results, encounter notes, upcoming appointments, etc.  Non-urgent messages can be sent to your provider as well.   To learn more about what you can do with MyChart, go to [email or web address].    Your next appointment:   3 month(s)  Provider:   Brian Munley, MD    Other Instructions None  

## 2023-06-05 NOTE — Progress Notes (Unsigned)
[address]       [place] [address]             [phone]           JEMUEL GLESSNER [facility] Medical Record #[id number] Date of Birth: Apr 25, 1941  Baldo Daub, MD [place], [name]  Chief Complaint: Tiredness and DOE   History of Present Illness:     Pt is a very pleasant 82 yo male who was told had a leaky aortic valve over 5 years ago and has been followed by Dr Dulce Sellar. Pt has experienced a dramatic decline in functional ability the past 6 months. He was doing very active chores around large home acreage but has declined significantly to where he can't do very much at all. He becomes winded and also describes some lower chest discomfort. All stop when he rests. Pt underwent recent ehco with normal LV function but with moderate to severe AI and mild AS. Pt underwent cath with moderate CAD and LV gram with severe AR. Pt also reports having seen neurology due to concerns of strokes with an inability to control his lower right leg and was negative on CT scans but with evidence of small CVA on MRI. Pt was started on plavix. Pt is very anxious to proceed with AVR and on consideration for TAVR was felt not a candidate due to the lack of calcium to hold valve      Past Medical History:  Diagnosis Date   Anemia    recent iron supplement   Aortic regurgitation 03/12/2017   Aortic stenosis 04/29/2019   Asthma    childhood-   2013 bronchitis w/wheezing   Complication of anesthesia    " ether made me sick" - "woke up during colonoscopy"   Diabetes mellitus without complication (HCC)    Essential hypertension 03/12/2017   Frequency of urination    GERD (gastroesophageal reflux disease)    occasional   Heart murmur    Hemorrhoids    History of kidney stones    Hx of colon cancer, stage I    Hx of nonmelanoma skin cancer    Hx of peptic ulcer    as teenager   Hx of transfusion    Hyperlipidemia    Hypertension    Inguinal hernia    PONV (postoperative  nausea and vomiting)    only when had Ether as Anesthesia drug   Prostate cancer (HCC) 08/15/2012   also Hx colon cancer / Hx skin cancer   Right inguinal hernia 05/08/2016   S/P right inguinal hernia repair Sept 2015 05/17/2014   Recurrent right indirect  Hernia treated with robotic TAPP repair with enterolysis Sept 2017   Stroke [facility]) 03/28/2022    Past Surgical History:  Procedure Laterality Date   cataract surgery  2009   CYSTOSCOPY     INGUINAL HERNIA REPAIR Right 05/17/2014   Procedure: RIGHT INGUINAL HERNIA REPAIR ;  Surgeon: Wenda Low, MD;  Location: [facility];  Service: General;  Laterality: Right;  With MESH   KNEE SURGERY Right 2012   LEFT HEART CATH AND CORONARY ANGIOGRAPHY N/A 05/03/2023   Procedure: LEFT HEART CATH AND CORONARY ANGIOGRAPHY;  Surgeon: Tonny Bollman, MD;  Location: [facility] INVASIVE CV LAB;  Service: Cardiovascular;  Laterality: N/A;   PARTIAL COLECTOMY  2005   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   RADIOACTIVE SEED IMPLANT N/A 11/24/2012   Procedure: RADIOACTIVE SEED IMPLANT;  Surgeon: Valetta Fuller,  MD;  Location: [facility];  Service: Urology;  Laterality: N/A;   71seeds implanted  no seeds found in bladder   THROAT SURGERY  1940's   as child (age 20)growth that created fissure/corrective surgery   TONSILLECTOMY     age 52   XI ROBOTIC ASSISTED INGUINAL HERNIA REPAIR WITH MESH Right 05/08/2016   Procedure: XI ROBOTIC ASSISTED REPAIR OF RECURRENT RIGHT INGUINAL HERNIA;  Surgeon: Luretha Murphy, MD;  Location: [facility];  Service: General;  Laterality: Right;  With MESH    Social History   Tobacco Use  Smoking Status Never   Passive exposure: Never  Smokeless Tobacco Never    Social History   Substance and Sexual Activity  Alcohol Use No    Social History   Socioeconomic History   Marital status: Married    Spouse name: Not on file   Number of children: 2   Years of education: Not on file   Highest education level: Not on  file  Occupational History   Occupation: Retired    Comment: Designer, fashion/clothing  Tobacco Use   Smoking status: Never    Passive exposure: Never   Smokeless tobacco: Never  Vaping Use   Vaping status: Never Used  Substance and Sexual Activity   Alcohol use: No   Drug use: No   Sexual activity: Not on file  Other Topics Concern   Not on file  Social History Narrative   Not on file   Social Determinants of Health   Financial Resource Strain: Not on file  Food Insecurity: Not on file  Transportation Needs: Not on file  Physical Activity: Not on file  Stress: Not on file  Social Connections: Not on file  Intimate Partner Violence: Not on file    Allergies  Allergen Reactions   Penicillins Anaphylaxis, Shortness Of Breath, Itching and Swelling        Iodinated Contrast Media Hives and Rash   Aspirin Other (See Comments)    GI bleeding-avoids    Monodox [Doxycycline Hyclate] Nausea Only    "made me feel sick"   Sulfa Antibiotics Itching and Swelling   Tramadol Nausea And Vomiting    Current Outpatient Medications  Medication Sig Dispense Refill   albuterol (PROVENTIL HFA;VENTOLIN HFA) 108 (90 BASE) MCG/ACT inhaler Inhale 2 puffs into the lungs See admin instructions. Inhale 2 puffs every night, may inhale 2 puffs every 6 hours as needed for shortness of breath     bisoprolol-hydrochlorothiazide (ZIAC) 10-6.25 MG per tablet Take 1 tablet by mouth every morning.      budesonide-formoterol (SYMBICORT) 160-4.5 MCG/ACT inhaler Inhale 2 puffs into the lungs 2 (two) times daily.     clopidogrel (PLAVIX) 75 MG tablet Take 1 tablet (75 mg total) by mouth daily. 30 tablet 11   cyanocobalamin (,VITAMIN B-12,) 1000 MCG/ML injection Inject 1 mL into the skin every 30 (thirty) days.     dicyclomine (BENTYL) 10 MG capsule Take 10 mg by mouth 2 (two) times daily as needed for cramping.     fexofenadine (ALLEGRA) 180 MG tablet Take 180 mg by mouth daily.     fluticasone (FLONASE) 50 MCG/ACT nasal  spray Place 1 spray into both nostrils daily.      furosemide (LASIX) 20 MG tablet Take 10-20 mg by mouth daily as needed for fluid or edema.     glimepiride (AMARYL) 2 MG tablet Take 2 mg by mouth daily.     isosorbide mononitrate (IMDUR) 30 MG 24 hr tablet  Take 1 tablet (30 mg total) by mouth daily. 90 tablet 1   levothyroxine (SYNTHROID) 75 MCG tablet Take 75 mcg by mouth daily before breakfast.     Multiple Vitamin (MULTIVITAMIN WITH MINERALS) TABS tablet Take 1 tablet by mouth daily.     nitroGLYCERIN (NITROSTAT) 0.4 MG SL tablet DISSOLVE 1 TABLET UNDER THE TONGUE EVERY 5 MINUTES AS NEEDED FOR CHEST PAIN. DO NOT EXCEED A TOTAL OF 3 DOSES IN 15 MINUTES. 25 tablet 3   omeprazole (PRILOSEC) 20 MG capsule Take 20 mg by mouth daily.     predniSONE (DELTASONE) 50 MG tablet Prednisone 50 mg - take 13 hours prior to test Take another Prednisone 50 mg 7 hours prior to test Take another Prednisone 50 mg 1 hour prior to test Take Benadryl 50 mg 1 hour prior to test Patient must complete all four doses of above prophylactic medications. Patient will need a ride after test due to Benadryl. 3 tablet 0   rosuvastatin (CRESTOR) 20 MG tablet Take 1 tablet (20 mg total) by mouth daily. 90 tablet 1   solifenacin (VESICARE) 10 MG tablet Take 10 mg by mouth daily.     Tamsulosin HCl (FLOMAX) 0.4 MG CAPS Take 0.4 mg by mouth 2 (two) times daily.      No current facility-administered medications for this visit.     Family History  Problem Relation Age of Onset   Heart attack Other    Brain cancer Other    Coronary artery disease Other    Prostate cancer Other        Physical Exam: Teeth in good repair Lungs: clear Card: RR with 3/6 diastolic murmur Ext: no edema Neuro: alert no focal deficitis     Diagnostic Studies & Laboratory data: I have personally reviewed the following studies and agree with the findings    TTE (06/2022) IMPRESSIONS     1. GLS -12.9. Left ventricular ejection  fraction, by estimation, is 60 to  65%. The left ventricle has normal function. The left ventricle has no  regional wall motion abnormalities. Left ventricular diastolic parameters  are consistent with Grade I  diastolic dysfunction (impaired relaxation).   2. Right ventricular systolic function is normal. The right ventricular  size is normal. There is normal pulmonary artery systolic pressure.   3. The mitral valve is normal in structure. No evidence of mitral valve  regurgitation. No evidence of mitral stenosis.   4. EF still 60-65%, normal LV size. The aortic valve is calcified. There  is moderate calcification of the aortic valve. There is mild thickening of  the aortic valve. Aortic valve regurgitation is moderate to severe. Mild  to moderate aortic valve  stenosis. Aortic valve mean gradient measures 14.0 mmHg.   5. The inferior vena cava is normal in size with greater than 50%  respiratory variability, suggesting right atrial pressure of 3 mmHg.   FINDINGS   Left Ventricle: GLS -12.9. Left ventricular ejection fraction, by  estimation, is 60 to 65%. The left ventricle has normal function. The left  ventricle has no regional wall motion abnormalities. The left ventricular  internal cavity size was normal in  size. There is no left ventricular hypertrophy. Left ventricular diastolic  parameters are consistent with Grade I diastolic dysfunction (impaired  relaxation).   Right Ventricle: The right ventricular size is normal. No increase in  right ventricular wall thickness. Right ventricular systolic function is  normal. There is normal pulmonary artery systolic pressure. The tricuspid  regurgitant  velocity is 2.55 m/s, and   with an assumed right atrial pressure of 3 mmHg, the estimated right  ventricular systolic pressure is 29.0 mmHg.   Left Atrium: Left atrial size was normal in size.   Right Atrium: Right atrial size was normal in size.   Pericardium: There is no  evidence of pericardial effusion.   Mitral Valve: The mitral valve is normal in structure. No evidence of  mitral valve regurgitation. No evidence of mitral valve stenosis.   Tricuspid Valve: The tricuspid valve is normal in structure. Tricuspid  valve regurgitation is mild . No evidence of tricuspid stenosis.   Aortic Valve: EF still 60-65%, normal LV size. The aortic valve is  calcified. There is moderate calcification of the aortic valve. There is  mild thickening of the aortic valve. Aortic valve regurgitation is  moderate to severe. Aortic regurgitation PHT  measures 314 msec. Mild to moderate aortic stenosis is present. Aortic  valve mean gradient measures 14.0 mmHg. Aortic valve peak gradient  measures 26.1 mmHg. Aortic valve area, by VTI measures 1.11 cm.   Pulmonic Valve: The pulmonic valve was normal in structure. Pulmonic valve  regurgitation is not visualized. No evidence of pulmonic stenosis.   Aorta: The aortic root is normal in size and structure.   Venous: The inferior vena cava is normal in size with greater than 50%  respiratory variability, suggesting right atrial pressure of 3 mmHg.   IAS/Shunts: No atrial level shunt detected by color flow Doppler.     LEFT VENTRICLE  PLAX 2D  LVIDd:         4.30 cm   Diastology  LVIDs:         2.50 cm   LV e' medial:    4.03 cm/s  LV PW:         1.00 cm   LV E/e' medial:  19.0  LV IVS:        1.40 cm   LV e' lateral:   7.29 cm/s  LVOT diam:     1.80 cm   LV E/e' lateral: 10.5  LV SV:         58  LV SV Index:   32  LVOT Area:     2.54 cm     RIGHT VENTRICLE             IVC  RV S prime:     10.30 cm/s  IVC diam: 1.20 cm  TAPSE (M-mode): 2.2 cm   LEFT ATRIUM             Index        RIGHT ATRIUM          Index  LA diam:        3.50 cm 1.95 cm/m   RA Area:     9.45 cm  LA Vol (A2C):   48.2 ml 26.84 ml/m  RA Volume:   17.10 ml 9.52 ml/m  LA Vol (A4C):   54.0 ml 30.07 ml/m  LA Biplane Vol: 51.6 ml 28.74 ml/m    AORTIC VALVE  AV Area (Vmax):    0.99 cm  AV Area (Vmean):   1.01 cm  AV Area (VTI):     1.11 cm  AV Vmax:           255.50 cm/s  AV Vmean:          168.500 cm/s  AV VTI:            0.525 m  AV Peak Grad:      26.1 mmHg  AV Mean Grad:      14.0 mmHg  LVOT Vmax:         99.80 cm/s  LVOT Vmean:        66.800 cm/s  LVOT VTI:          0.228 m  LVOT/AV VTI ratio: 0.43  AI PHT:            314 msec    AORTA  Ao Root diam: 2.30 cm  Ao Asc diam:  3.00 cm   MITRAL VALVE               TRICUSPID VALVE  MV Area (PHT): 3.74 cm    TR Peak grad:   26.0 mmHg  MV Decel Time: 203 msec    TR Vmax:        255.00 cm/s  MV E velocity: 76.70 cm/s  MV A velocity: 83.60 cm/s  SHUNTS  MV E/A ratio:  0.92        Systemic VTI:  0.23 m                             Systemic Diam: 1.80 cm    Cardiac Cath (04/2023) Conclusion      Prox RCA to Mid RCA lesion is 50% stenosed.   Mid LM to Dist LM lesion is 30% stenosed.   Prox Cx to Dist Cx lesion is 50% stenosed.   Prox LAD to Mid LAD lesion is 50% stenosed.   1st Diag lesion is 60% stenosed.   There is severe (4+) aortic regurgitation.   1.  Moderate multivessel coronary artery disease with mild distal left main plaquing of 30%, moderate diffuse LAD stenosis of 50%, mild to moderate mid circumflex stenosis of 40 to 50%, and moderate mid RCA stenosis of 50%. 2.  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  Severe right subclavian tortuosity makes cardiac catheterization very difficult from right radial access.  If patient requires future cardiac catheterization, avoid right radial access.  Coronary CTA FFR (03/2023)  FINDINGS: FFRct analysis was performed on the original cardiac CT angiogram dataset. Diagrammatic representation of the FFRct analysis is provided in a separate PDF document in PACS. This dictation was created using the PDF document and an interactive 3D model of the results. 3D model is not available in the  EMR/PACS. Normal FFR range is >0.80.   1. Left Main: Normal   2. LAD: Non diagnostic. Proximal FFR = 0.94. The mid and distal LAD are not modeled.   3. LCX: Possible flow limiting. proximal FFR = 0.96, Mid FFR = 0.86, Distal FFR = 0.77.   4. RCA: Normal. Proximal FFR = 0.99, Mid FFR = 0.91, Distal FFR = 0.87.   IMPRESSION: 1. Coronary CTA FFR analysis demonstrates possible flow limiting lesion in the mid to distal LCx (0.97>0.86>0.77) which corresponds to a moderate to possibly severe calcified lesion in the mid LCx by CTA. The LAD could not be modeled past the proximal portion. CTA showed a possible significant lesion in the mid LAD.   2. Recommend cardiac catheterization.    Recent Radiology Findings:       Recent Lab Findings: Lab Results  Component Value Date   WBC 6.7 05/02/2023   HGB 15.9 05/02/2023   HCT 48.2 05/02/2023   PLT 161 05/02/2023   GLUCOSE 131 (H) 05/02/2023   CHOL 95 (  L) 03/21/2023   TRIG 137 03/21/2023   HDL 32 (L) 03/21/2023   LDLCALC 39 03/21/2023   ALT 25 12/09/2019   AST 25 12/09/2019   NA 140 05/02/2023   K 4.4 05/02/2023   CL 101 05/02/2023   CREATININE 1.09 05/02/2023   BUN 21 05/02/2023   CO2 25 05/02/2023   TSH 2.620 03/21/2023   INR 0.90 11/17/2012   HGBA1C 6.6 (H) 03/21/2023      Assessment / Plan:     Pt is an 82 yo male with NYHA class 2-3 symptoms of severe AI with normal LV function and moderate CAD. I agree that AVR is indicated and would place a LIMA to his LAD at that time. His other CAD not sufficiently bad for grafting. He and his family understand the risks and goals and recovery from surgery and wish to proceed understanding his increased risk secondary to his age and history of CVA. Pt will have noncontrast CT of chest and be off plavix for 7 days prior to surgery planned on November 8.    I have spent 60 min in review of the records, viewing studies and in face to face with patient and in coordination of future  care    [name] 06/05/2023 9:08 AM

## 2023-06-06 ENCOUNTER — Encounter: Payer: Self-pay | Admitting: Thoracic Surgery (Cardiothoracic Vascular Surgery)

## 2023-06-06 ENCOUNTER — Institutional Professional Consult (permissible substitution): Payer: Medicare Other | Admitting: Thoracic Surgery (Cardiothoracic Vascular Surgery)

## 2023-06-06 ENCOUNTER — Other Ambulatory Visit: Payer: Self-pay | Admitting: Thoracic Surgery (Cardiothoracic Vascular Surgery)

## 2023-06-06 VITALS — BP 190/69 | HR 65 | Resp 20 | Ht 66.0 in | Wt 145.6 lb

## 2023-06-06 DIAGNOSIS — I351 Nonrheumatic aortic (valve) insufficiency: Secondary | ICD-10-CM

## 2023-06-06 DIAGNOSIS — I2510 Atherosclerotic heart disease of native coronary artery without angina pectoris: Secondary | ICD-10-CM | POA: Diagnosis not present

## 2023-06-06 NOTE — Patient Instructions (Signed)
Non contrast CT of chest Stop plavix 7 days prior to surgery AVR tissue with CABG x 1 ([place] to LAD) on Nov 8

## 2023-06-07 ENCOUNTER — Other Ambulatory Visit: Payer: Self-pay | Admitting: *Deleted

## 2023-06-07 DIAGNOSIS — I351 Nonrheumatic aortic (valve) insufficiency: Secondary | ICD-10-CM

## 2023-06-07 DIAGNOSIS — I2510 Atherosclerotic heart disease of native coronary artery without angina pectoris: Secondary | ICD-10-CM

## 2023-06-10 ENCOUNTER — Encounter: Payer: Self-pay | Admitting: *Deleted

## 2023-07-02 DIAGNOSIS — E1139 Type 2 diabetes mellitus with other diabetic ophthalmic complication: Secondary | ICD-10-CM | POA: Diagnosis not present

## 2023-07-02 DIAGNOSIS — I6521 Occlusion and stenosis of right carotid artery: Secondary | ICD-10-CM | POA: Diagnosis not present

## 2023-07-02 DIAGNOSIS — E034 Atrophy of thyroid (acquired): Secondary | ICD-10-CM | POA: Diagnosis not present

## 2023-07-02 DIAGNOSIS — I209 Angina pectoris, unspecified: Secondary | ICD-10-CM | POA: Diagnosis not present

## 2023-07-02 DIAGNOSIS — I2510 Atherosclerotic heart disease of native coronary artery without angina pectoris: Secondary | ICD-10-CM | POA: Diagnosis not present

## 2023-07-02 DIAGNOSIS — I351 Nonrheumatic aortic (valve) insufficiency: Secondary | ICD-10-CM | POA: Diagnosis not present

## 2023-07-02 DIAGNOSIS — R600 Localized edema: Secondary | ICD-10-CM | POA: Diagnosis not present

## 2023-07-02 NOTE — Pre-Procedure Instructions (Signed)
Surgical Instructions   Your procedure is scheduled on Friday, November 8th. Report to [facility] Main Entrance "A" at 05:30 A.M., then check in with the Admitting office. Any questions or running late day of surgery: call [phone]  Questions prior to your surgery date: call [phone], Monday-Friday, 8am-4pm. If you experience any cold or flu symptoms such as cough, fever, chills, shortness of breath, etc. between now and your scheduled surgery, please notify us at the above number.     Remember:  Do not eat or drink after midnight the night before your surgery    Take these medicines the morning of surgery with A SIP OF WATER  budesonide-formoterol (SYMBICORT)  fexofenadine (ALLEGRA)  fluticasone (FLONASE)  isosorbide mononitrate (IMDUR)  levothyroxine (SYNTHROID)  omeprazole (PRILOSEC)  rosuvastatin (CRESTOR)  solifenacin (VESICARE)  Tamsulosin HCl [facility])    May take these medicines IF NEEDED: albuterol (PROVENTIL HFA;VENTOLIN HFA)- bring inhaler with you on day of surgery dicyclomine (BENTYL)  nitroGLYCERIN (NITROSTAT)- if you have to take this medication prior to surgery, please call one of the above phone numbers and report this to a Nurse   STOP PLAVIX 7 DAYS PRIOR TO SURGERY. LAST DOSE 10/31.   One week prior to surgery, STOP taking any Aspirin (unless otherwise instructed by your surgeon) Aleve, Naproxen, Ibuprofen, Motrin, Advil, Goody's, BC's, all herbal medications, fish oil, and non-prescription vitamins.  WHAT DO I DO ABOUT MY DIABETES MEDICATION?   Do not take glimepiride (AMARYL) the evening before surgery (11/7) OR  the morning of surgery (11/8).    HOW TO MANAGE YOUR DIABETES BEFORE AND AFTER SURGERY  Why is it important to control my blood sugar before and after surgery? Improving blood sugar levels before and after surgery helps healing and can limit problems. A way of improving blood sugar control is eating a healthy diet by:  Eating  less sugar and carbohydrates  Increasing activity/exercise  Talking with your doctor about reaching your blood sugar goals High blood sugars (greater than 180 mg/dL) can raise your risk of infections and slow your recovery, so you will need to focus on controlling your diabetes during the weeks before surgery. Make sure that the doctor who takes care of your diabetes knows about your planned surgery including the date and location.  How do I manage my blood sugar before surgery? Check your blood sugar at least 4 times a day, starting 2 days before surgery, to make sure that the level is not too high or low.  Check your blood sugar the morning of your surgery when you wake up and every 2 hours until you get to the Short Stay unit.  If your blood sugar is less than 70 mg/dL, you will need to treat for low blood sugar: Do not take insulin. Treat a low blood sugar (less than 70 mg/dL) with  cup of clear juice (cranberry or apple), 4 glucose tablets, OR glucose gel. Recheck blood sugar in 15 minutes after treatment (to make sure it is greater than 70 mg/dL). If your blood sugar is not greater than 70 mg/dL on recheck, call [phone] for further instructions. Report your blood sugar to the short stay nurse when you get to Short Stay.  If you are admitted to the hospital after surgery: Your blood sugar will be checked by the staff and you will probably be given insulin after surgery (instead of oral diabetes medicines) to make sure you have good blood sugar levels. The goal for blood sugar control after surgery  is 80-180 mg/dL.                     Do NOT Smoke (Tobacco/Vaping) for 24 hours prior to your procedure.  If you use a CPAP at night, you may bring your mask/headgear for your overnight stay.   You will be asked to remove any contacts, glasses, piercing's, hearing aid's, dentures/partials prior to surgery. Please bring cases for these items if needed.    Patients discharged the day of  surgery will not be allowed to drive home, and someone needs to stay with them for 24 hours.  SURGICAL WAITING ROOM VISITATION Patients may have no more than 2 support people in the waiting area - these visitors may rotate.   Pre-op nurse will coordinate an appropriate time for 1 ADULT support person, who may not rotate, to accompany patient in pre-op.  Children under the age of 31 must have an adult with them who is not the patient and must remain in the main waiting area with an adult.  If the patient needs to stay at the hospital during part of their recovery, the visitor guidelines for inpatient rooms apply.  Please refer to the [facility] website for the visitor guidelines for any additional information.   If you received a COVID test during your pre-op visit  it is requested that you wear a mask when out in public, stay away from anyone that may not be feeling well and notify your surgeon if you develop symptoms. If you have been in contact with anyone that has tested positive in the last 10 days please notify you surgeon.      Pre-operative CHG Bathing Instructions   You can play a key role in reducing the risk of infection after surgery. Your skin needs to be as free of germs as possible. You can reduce the number of germs on your skin by washing with CHG (chlorhexidine gluconate) soap before surgery. CHG is an antiseptic soap that kills germs and continues to kill germs even after washing.   DO NOT use if you have an allergy to chlorhexidine/CHG or antibacterial soaps. If your skin becomes reddened or irritated, stop using the CHG and notify one of our RNs at [phone].              TAKE A SHOWER THE NIGHT BEFORE SURGERY AND THE DAY OF SURGERY    Please keep in mind the following:  DO NOT shave, including legs and underarms, 48 hours prior to surgery.   You may shave your face before/day of surgery.  Place clean sheets on your bed the night before surgery Use a clean  washcloth (not used since being washed) for each shower. DO NOT sleep with pet's night before surgery.  CHG Shower Instructions:  Wash your face and private area with normal soap. If you choose to wash your hair, wash first with your normal shampoo.  After you use shampoo/soap, rinse your hair and body thoroughly to remove shampoo/soap residue.  Turn the water OFF and apply half the bottle of CHG soap to a CLEAN washcloth.  Apply CHG soap ONLY FROM YOUR NECK DOWN TO YOUR TOES (washing for 3-5 minutes)  DO NOT use CHG soap on face, private areas, open wounds, or sores.  Pay special attention to the area where your surgery is being performed.  If you are having back surgery, having someone wash your back for you may be helpful. Wait 2 minutes after CHG soap  is applied, then you may rinse off the CHG soap.  Pat dry with a clean towel  Put on clean pajamas    Additional instructions for the day of surgery: DO NOT APPLY any lotions, deodorants, cologne, or perfumes.   Do not wear jewelry or makeup Do not wear nail polish, gel polish, artificial nails, or any other type of covering on natural nails (fingers and toes) Do not bring valuables to the hospital. [facility] is not responsible for valuables/personal belongings. Put on clean/comfortable clothes.  Please brush your teeth.  Ask your nurse before applying any prescription medications to the skin.

## 2023-07-03 ENCOUNTER — Other Ambulatory Visit: Payer: Self-pay

## 2023-07-03 ENCOUNTER — Encounter (HOSPITAL_COMMUNITY)
Admission: RE | Admit: 2023-07-03 | Discharge: 2023-07-03 | Disposition: A | Discharge status: Home / Self Care | Payer: Medicare Other | Source: Ambulatory Visit | Attending: Thoracic Surgery (Cardiothoracic Vascular Surgery) | Admitting: Thoracic Surgery (Cardiothoracic Vascular Surgery)

## 2023-07-03 ENCOUNTER — Encounter (HOSPITAL_COMMUNITY): Payer: Self-pay

## 2023-07-03 ENCOUNTER — Ambulatory Visit (HOSPITAL_COMMUNITY)
Admission: RE | Admit: 2023-07-03 | Discharge: 2023-07-03 | Disposition: A | Discharge status: Home / Self Care | Payer: Medicare Other | Source: Ambulatory Visit | Attending: Thoracic Surgery (Cardiothoracic Vascular Surgery)

## 2023-07-03 ENCOUNTER — Inpatient Hospital Stay (HOSPITAL_BASED_OUTPATIENT_CLINIC_OR_DEPARTMENT_OTHER)
Admission: RE | Admit: 2023-07-03 | Discharge: 2023-07-03 | Disposition: A | Discharge status: Home / Self Care | Payer: Medicare Other | Source: Ambulatory Visit | Attending: Thoracic Surgery (Cardiothoracic Vascular Surgery) | Admitting: Thoracic Surgery (Cardiothoracic Vascular Surgery)

## 2023-07-03 ENCOUNTER — Ambulatory Visit (HOSPITAL_COMMUNITY)
Admission: RE | Admit: 2023-07-03 | Discharge: 2023-07-03 | Disposition: A | Discharge status: Home / Self Care | Payer: Medicare Other | Source: Ambulatory Visit | Attending: Thoracic Surgery (Cardiothoracic Vascular Surgery) | Admitting: Thoracic Surgery (Cardiothoracic Vascular Surgery)

## 2023-07-03 VITALS — BP 186/63 | HR 67 | Temp 97.7°F | Resp 17 | Ht 67.0 in | Wt 146.3 lb

## 2023-07-03 DIAGNOSIS — I6349 Cerebral infarction due to embolism of other cerebral artery: Secondary | ICD-10-CM | POA: Diagnosis not present

## 2023-07-03 DIAGNOSIS — I350 Nonrheumatic aortic (valve) stenosis: Secondary | ICD-10-CM | POA: Diagnosis not present

## 2023-07-03 DIAGNOSIS — I25119 Atherosclerotic heart disease of native coronary artery with unspecified angina pectoris: Secondary | ICD-10-CM | POA: Diagnosis not present

## 2023-07-03 DIAGNOSIS — I351 Nonrheumatic aortic (valve) insufficiency: Secondary | ICD-10-CM | POA: Diagnosis not present

## 2023-07-03 DIAGNOSIS — Z952 Presence of prosthetic heart valve: Secondary | ICD-10-CM | POA: Diagnosis not present

## 2023-07-03 DIAGNOSIS — Z1152 Encounter for screening for COVID-19: Secondary | ICD-10-CM | POA: Insufficient documentation

## 2023-07-03 DIAGNOSIS — N179 Acute kidney failure, unspecified: Secondary | ICD-10-CM | POA: Diagnosis not present

## 2023-07-03 DIAGNOSIS — I700 Atherosclerosis of aorta: Secondary | ICD-10-CM | POA: Diagnosis not present

## 2023-07-03 DIAGNOSIS — M6281 Muscle weakness (generalized): Secondary | ICD-10-CM | POA: Diagnosis not present

## 2023-07-03 DIAGNOSIS — Z452 Encounter for adjustment and management of vascular access device: Secondary | ICD-10-CM | POA: Diagnosis not present

## 2023-07-03 DIAGNOSIS — Z91041 Radiographic dye allergy status: Secondary | ICD-10-CM | POA: Diagnosis not present

## 2023-07-03 DIAGNOSIS — I480 Paroxysmal atrial fibrillation: Secondary | ICD-10-CM | POA: Diagnosis not present

## 2023-07-03 DIAGNOSIS — Z01818 Encounter for other preprocedural examination: Secondary | ICD-10-CM | POA: Insufficient documentation

## 2023-07-03 DIAGNOSIS — I517 Cardiomegaly: Secondary | ICD-10-CM | POA: Diagnosis not present

## 2023-07-03 DIAGNOSIS — Z0389 Encounter for observation for other suspected diseases and conditions ruled out: Secondary | ICD-10-CM | POA: Diagnosis not present

## 2023-07-03 DIAGNOSIS — M6259 Muscle wasting and atrophy, not elsewhere classified, multiple sites: Secondary | ICD-10-CM | POA: Diagnosis not present

## 2023-07-03 DIAGNOSIS — J9811 Atelectasis: Secondary | ICD-10-CM | POA: Diagnosis not present

## 2023-07-03 DIAGNOSIS — J939 Pneumothorax, unspecified: Secondary | ICD-10-CM | POA: Diagnosis not present

## 2023-07-03 DIAGNOSIS — Z79890 Hormone replacement therapy (postmenopausal): Secondary | ICD-10-CM | POA: Diagnosis not present

## 2023-07-03 DIAGNOSIS — Z886 Allergy status to analgesic agent status: Secondary | ICD-10-CM | POA: Diagnosis not present

## 2023-07-03 DIAGNOSIS — I441 Atrioventricular block, second degree: Secondary | ICD-10-CM | POA: Diagnosis not present

## 2023-07-03 DIAGNOSIS — F05 Delirium due to known physiological condition: Secondary | ICD-10-CM | POA: Diagnosis not present

## 2023-07-03 DIAGNOSIS — Z8249 Family history of ischemic heart disease and other diseases of the circulatory system: Secondary | ICD-10-CM | POA: Diagnosis not present

## 2023-07-03 DIAGNOSIS — Z8546 Personal history of malignant neoplasm of prostate: Secondary | ICD-10-CM | POA: Diagnosis not present

## 2023-07-03 DIAGNOSIS — E119 Type 2 diabetes mellitus without complications: Secondary | ICD-10-CM | POA: Diagnosis not present

## 2023-07-03 DIAGNOSIS — I352 Nonrheumatic aortic (valve) stenosis with insufficiency: Secondary | ICD-10-CM | POA: Diagnosis not present

## 2023-07-03 DIAGNOSIS — R2689 Other abnormalities of gait and mobility: Secondary | ICD-10-CM | POA: Diagnosis not present

## 2023-07-03 DIAGNOSIS — I639 Cerebral infarction, unspecified: Secondary | ICD-10-CM | POA: Diagnosis not present

## 2023-07-03 DIAGNOSIS — K219 Gastro-esophageal reflux disease without esophagitis: Secondary | ICD-10-CM | POA: Diagnosis not present

## 2023-07-03 DIAGNOSIS — I2510 Atherosclerotic heart disease of native coronary artery without angina pectoris: Secondary | ICD-10-CM

## 2023-07-03 DIAGNOSIS — Z978 Presence of other specified devices: Secondary | ICD-10-CM | POA: Diagnosis not present

## 2023-07-03 DIAGNOSIS — E785 Hyperlipidemia, unspecified: Secondary | ICD-10-CM | POA: Diagnosis not present

## 2023-07-03 DIAGNOSIS — R531 Weakness: Secondary | ICD-10-CM | POA: Diagnosis not present

## 2023-07-03 DIAGNOSIS — M6289 Other specified disorders of muscle: Secondary | ICD-10-CM | POA: Diagnosis not present

## 2023-07-03 DIAGNOSIS — I97820 Postprocedural cerebrovascular infarction during cardiac surgery: Secondary | ICD-10-CM | POA: Diagnosis not present

## 2023-07-03 DIAGNOSIS — J984 Other disorders of lung: Secondary | ICD-10-CM | POA: Diagnosis not present

## 2023-07-03 DIAGNOSIS — Z48812 Encounter for surgical aftercare following surgery on the circulatory system: Secondary | ICD-10-CM | POA: Diagnosis not present

## 2023-07-03 DIAGNOSIS — Z79899 Other long term (current) drug therapy: Secondary | ICD-10-CM | POA: Diagnosis not present

## 2023-07-03 DIAGNOSIS — D62 Acute posthemorrhagic anemia: Secondary | ICD-10-CM | POA: Diagnosis not present

## 2023-07-03 DIAGNOSIS — Z4682 Encounter for fitting and adjustment of non-vascular catheter: Secondary | ICD-10-CM | POA: Diagnosis not present

## 2023-07-03 DIAGNOSIS — Z951 Presence of aortocoronary bypass graft: Secondary | ICD-10-CM | POA: Diagnosis not present

## 2023-07-03 DIAGNOSIS — I10 Essential (primary) hypertension: Secondary | ICD-10-CM | POA: Diagnosis not present

## 2023-07-03 DIAGNOSIS — D6959 Other secondary thrombocytopenia: Secondary | ICD-10-CM | POA: Diagnosis not present

## 2023-07-03 DIAGNOSIS — I6340 Cerebral infarction due to embolism of unspecified cerebral artery: Secondary | ICD-10-CM | POA: Diagnosis not present

## 2023-07-03 DIAGNOSIS — Z950 Presence of cardiac pacemaker: Secondary | ICD-10-CM | POA: Diagnosis not present

## 2023-07-03 DIAGNOSIS — E039 Hypothyroidism, unspecified: Secondary | ICD-10-CM | POA: Diagnosis not present

## 2023-07-03 DIAGNOSIS — Z85828 Personal history of other malignant neoplasm of skin: Secondary | ICD-10-CM | POA: Diagnosis not present

## 2023-07-03 DIAGNOSIS — Z8673 Personal history of transient ischemic attack (TIA), and cerebral infarction without residual deficits: Secondary | ICD-10-CM | POA: Diagnosis not present

## 2023-07-03 DIAGNOSIS — R41841 Cognitive communication deficit: Secondary | ICD-10-CM | POA: Diagnosis not present

## 2023-07-03 DIAGNOSIS — J90 Pleural effusion, not elsewhere classified: Secondary | ICD-10-CM | POA: Diagnosis not present

## 2023-07-03 DIAGNOSIS — E871 Hypo-osmolality and hyponatremia: Secondary | ICD-10-CM | POA: Diagnosis not present

## 2023-07-03 DIAGNOSIS — Z741 Need for assistance with personal care: Secondary | ICD-10-CM | POA: Diagnosis not present

## 2023-07-03 DIAGNOSIS — R2681 Unsteadiness on feet: Secondary | ICD-10-CM | POA: Diagnosis not present

## 2023-07-03 DIAGNOSIS — R918 Other nonspecific abnormal finding of lung field: Secondary | ICD-10-CM | POA: Diagnosis not present

## 2023-07-03 DIAGNOSIS — R4182 Altered mental status, unspecified: Secondary | ICD-10-CM | POA: Diagnosis not present

## 2023-07-03 DIAGNOSIS — Z7401 Bed confinement status: Secondary | ICD-10-CM | POA: Diagnosis not present

## 2023-07-03 DIAGNOSIS — I442 Atrioventricular block, complete: Secondary | ICD-10-CM | POA: Diagnosis not present

## 2023-07-03 LAB — SURGICAL PCR SCREEN
MRSA, PCR: NEGATIVE
Staphylococcus aureus: NEGATIVE

## 2023-07-03 LAB — COMPREHENSIVE METABOLIC PANEL
ALT: 15 U/L (ref 0–44)
AST: 22 U/L (ref 15–41)
Albumin: 3.8 g/dL (ref 3.5–5.0)
Alkaline Phosphatase: 55 U/L (ref 38–126)
Anion gap: 7 (ref 5–15)
BUN: 11 mg/dL (ref 8–23)
CO2: 29 mmol/L (ref 22–32)
Calcium: 9.4 mg/dL (ref 8.9–10.3)
Chloride: 104 mmol/L (ref 98–111)
Creatinine, Ser: 0.81 mg/dL (ref 0.61–1.24)
GFR, Estimated: >60 mL/min (ref >60)
Glucose, Bld: 81 mg/dL (ref 70–99)
Potassium: 3.6 mmol/L (ref 3.5–5.1)
Sodium: 140 mmol/L (ref 135–145)
Total Bilirubin: 1.6 mg/dL — ABNORMAL HIGH (ref <1.2)
Total Protein: 6.3 g/dL — ABNORMAL LOW (ref 6.5–8.1)

## 2023-07-03 LAB — URINALYSIS, ROUTINE W REFLEX MICROSCOPIC
Bacteria, UA: NONE SEEN
Bilirubin Urine: NEGATIVE
Glucose, UA: NEGATIVE mg/dL
Hgb urine dipstick: NEGATIVE
Ketones, ur: NEGATIVE mg/dL
Leukocytes,Ua: NEGATIVE
Nitrite: NEGATIVE
Protein, ur: 30 mg/dL — AB
Specific Gravity, Urine: 1.017 (ref 1.005–1.030)
pH: 6 (ref 5.0–8.0)

## 2023-07-03 LAB — GLUCOSE, CAPILLARY: Glucose-Capillary: 76 mg/dL (ref 70–99)

## 2023-07-03 LAB — CBC
HCT: 45.4 % (ref 39.0–52.0)
Hemoglobin: 14.9 g/dL (ref 13.0–17.0)
MCH: 28.2 pg (ref 26.0–34.0)
MCHC: 32.8 g/dL (ref 30.0–36.0)
MCV: 86 fL (ref 80.0–100.0)
Platelets: 177 10*3/uL (ref 150–400)
RBC: 5.28 MIL/uL (ref 4.22–5.81)
RDW: 13 % (ref 11.5–15.5)
WBC: 8.7 10*3/uL (ref 4.0–10.5)
nRBC: 0 % (ref 0.0–0.2)

## 2023-07-03 LAB — PROTIME-INR
INR: 1 (ref 0.8–1.2)
Prothrombin Time: 13.2 s (ref 11.4–15.2)

## 2023-07-03 LAB — HEMOGLOBIN A1C
Hgb A1c MFr Bld: 6.1 % — ABNORMAL HIGH (ref 4.8–5.6)
Mean Plasma Glucose: 128.37 mg/dL

## 2023-07-03 LAB — APTT: aPTT: 32 s (ref 24–36)

## 2023-07-03 NOTE — Progress Notes (Addendum)
PCP - Dr. Dulce Sellar Cardiologist - Dr. Belva Crome Neurology: Dr. Pearlean Brownie for multiple strokes.  Patient is very confused.   PPM/ICD - denies Device Orders - na Rep Notified - na  Chest x-ray - PAT, 11/6/20024 EKG - PAT, 07/03/2023 Stress Test - 08/10/2015 ECHO -  Cardiac Cath - 05/03/2023  Sleep Study - denies CPAP - na  Type  II diabetic. Blood sugar 73 at PAT.    Fasting Blood Sugar - Does not check it Checks Blood Sugar: Does not check it  Last dose of GLP1 agonist-  denies GLP1 instructions: denies  Blood Thinner Instructions: Plavix, stop 7 days prior to surgery, with last dose should have been 06/27/2023. Wife states Last dose with 06/28/2023.  Aspirin Instructions:denies  ERAS Protcol -NPO  COVID TEST- PAT, 07/03/2023  Anesthesia review:  Yes. HTN, DM, stroke, heat murmur  Patient denies shortness of breath, fever, cough and chest pain at PAT appointment   All instructions explained to the patient, with a verbal understanding of the material. Patient agrees to go over the instructions while at home for a better understanding. Patient also instructed to self quarantine after being tested for COVID-19. The opportunity to ask questions was provided.

## 2023-07-04 LAB — SARS CORONAVIRUS 2 (TAT 6-24 HRS): SARS Coronavirus 2: NEGATIVE

## 2023-07-04 MED ORDER — CEFAZOLIN SODIUM-DEXTROSE 2-4 GM/100ML-% IV SOLN
2.0000 g | INTRAVENOUS | Status: AC
  Administered 2023-07-05: 2 g via INTRAVENOUS
  Filled 2023-07-04: qty 100

## 2023-07-04 MED ORDER — NITROGLYCERIN IN D5W 200-5 MCG/ML-% IV SOLN
2.0000 ug/min | INTRAVENOUS | Status: DC
  Filled 2023-07-04: qty 250

## 2023-07-04 MED ORDER — VANCOMYCIN HCL 1250 MG/250ML IV SOLN
1250.0000 mg | INTRAVENOUS | Status: AC
  Administered 2023-07-05: 1250 mg via INTRAVENOUS
  Filled 2023-07-04: qty 250

## 2023-07-04 MED ORDER — TRANEXAMIC ACID (OHS) PUMP PRIME SOLUTION
2.0000 mg/kg | INTRAVENOUS | Status: DC
  Filled 2023-07-04: qty 1.33

## 2023-07-04 MED ORDER — MANNITOL 20 % IV SOLN
INTRAVENOUS | Status: DC
  Filled 2023-07-04: qty 13

## 2023-07-04 MED ORDER — TRANEXAMIC ACID 1000 MG/10ML IV SOLN
1.5000 mg/kg/h | INTRAVENOUS | Status: AC
  Administered 2023-07-05: 1.5 mg/kg/h via INTRAVENOUS
  Filled 2023-07-04: qty 25

## 2023-07-04 MED ORDER — PLASMA-LYTE A IV SOLN
INTRAVENOUS | Status: DC
  Filled 2023-07-04 (×2): qty 2.5

## 2023-07-04 MED ORDER — EPINEPHRINE HCL 5 MG/250ML IV SOLN IN NS
0.0000 ug/min | INTRAVENOUS | Status: DC
  Filled 2023-07-04: qty 250

## 2023-07-04 MED ORDER — DEXMEDETOMIDINE HCL IN NACL 400 MCG/100ML IV SOLN
0.1000 ug/kg/h | INTRAVENOUS | Status: AC
  Administered 2023-07-05: .5 ug/kg/h via INTRAVENOUS
  Filled 2023-07-04: qty 100

## 2023-07-04 MED ORDER — HEPARIN 30,000 UNITS/1000 ML (OHS) CELLSAVER SOLUTION
Status: DC
  Filled 2023-07-04: qty 1000

## 2023-07-04 MED ORDER — PHENYLEPHRINE HCL-NACL 20-0.9 MG/250ML-% IV SOLN
30.0000 ug/min | INTRAVENOUS | Status: AC
  Administered 2023-07-05: 25 ug/min via INTRAVENOUS
  Filled 2023-07-04: qty 250

## 2023-07-04 MED ORDER — CEFAZOLIN SODIUM-DEXTROSE 2-4 GM/100ML-% IV SOLN
2.0000 g | INTRAVENOUS | Status: DC
  Filled 2023-07-04: qty 100

## 2023-07-04 MED ORDER — POTASSIUM CHLORIDE 2 MEQ/ML IV SOLN
80.0000 meq | INTRAVENOUS | Status: DC
  Filled 2023-07-04: qty 40

## 2023-07-04 MED ORDER — VANCOMYCIN HCL 1000 MG IV SOLR
INTRAVENOUS | Status: DC
  Filled 2023-07-04 (×2): qty 20

## 2023-07-04 MED ORDER — INSULIN REGULAR(HUMAN) IN NACL 100-0.9 UT/100ML-% IV SOLN
INTRAVENOUS | Status: AC
  Administered 2023-07-05: 1.6 [IU]/h via INTRAVENOUS
  Filled 2023-07-04: qty 100

## 2023-07-04 MED ORDER — NOREPINEPHRINE 4 MG/250ML-% IV SOLN
0.0000 ug/min | INTRAVENOUS | Status: AC
  Administered 2023-07-05: 5 ug/min via INTRAVENOUS
  Filled 2023-07-04: qty 250

## 2023-07-04 MED ORDER — TRANEXAMIC ACID (OHS) BOLUS VIA INFUSION
15.0000 mg/kg | INTRAVENOUS | Status: AC
  Administered 2023-07-05: 996 mg via INTRAVENOUS
  Filled 2023-07-04: qty 996

## 2023-07-04 MED ORDER — MILRINONE LACTATE IN DEXTROSE 20-5 MG/100ML-% IV SOLN
0.3000 ug/kg/min | INTRAVENOUS | Status: DC
  Filled 2023-07-04: qty 100

## 2023-07-04 NOTE — H&P (Signed)
[address]       [place] [address]             [phone]                                   EUCLID CASSETTA [facility] Medical Record #[id number] Date of Birth: 1941/02/14   Baldo Daub, MD [place], [name]   Chief Complaint: Tiredness and DOE    History of Present Illness:     Pt is a very pleasant 82 yo male who was told had a leaky aortic valve over 5 years ago and has been followed by Dr Dulce Sellar. Pt has experienced a dramatic decline in functional ability the past 6 months. He was doing very active chores around large home acreage but has declined significantly to where he can't do very much at all. He becomes winded and also describes some lower chest discomfort. All stop when he rests. Pt underwent recent ehco with normal LV function but with moderate to severe AI and mild AS. Pt underwent cath with moderate CAD and LV gram with severe AR. Pt also reports having seen neurology due to concerns of strokes with an inability to control his lower right leg and was negative on CT scans but with evidence of small CVA on MRI. Pt was started on plavix. Pt is very anxious to proceed with AVR and on consideration for TAVR was felt not a candidate due to the lack of calcium to hold valve             Past Medical History:  Diagnosis Date   Anemia      recent iron supplement   Aortic regurgitation 03/12/2017   Aortic stenosis 04/29/2019   Asthma      childhood-   2013 bronchitis w/wheezing   Complication of anesthesia      " ether made me sick" - "woke up during colonoscopy"   Diabetes mellitus without complication (HCC)     Essential hypertension 03/12/2017   Frequency of urination     GERD (gastroesophageal reflux disease)      occasional   Heart murmur     Hemorrhoids     History of kidney stones     Hx of colon cancer, stage I     Hx of nonmelanoma skin cancer     Hx of peptic ulcer      as teenager   Hx of transfusion     Hyperlipidemia      Hypertension     Inguinal hernia     PONV (postoperative nausea and vomiting)      only when had Ether as Anesthesia drug   Prostate cancer (HCC) 08/15/2012    also Hx colon cancer / Hx skin cancer   Right inguinal hernia 05/08/2016   S/P right inguinal hernia repair Sept 2015 05/17/2014    Recurrent right indirect  Hernia treated with robotic TAPP repair with enterolysis Sept 2017   Stroke [facility]) 03/28/2022               Past Surgical History:  Procedure Laterality Date   cataract surgery   2009   CYSTOSCOPY       INGUINAL HERNIA REPAIR Right 05/17/2014    Procedure: RIGHT INGUINAL HERNIA REPAIR ;  Surgeon: Wenda Low, MD;  Location: [facility];  Service: General;  Laterality: Right;  With  MESH   KNEE SURGERY Right 2012   LEFT HEART CATH AND CORONARY ANGIOGRAPHY N/A 05/03/2023    Procedure: LEFT HEART CATH AND CORONARY ANGIOGRAPHY;  Surgeon: Tonny Bollman, MD;  [facility] LAB;  Service: Cardiovascular;  Laterality: N/A;   PARTIAL COLECTOMY   2005   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/15/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   RADIOACTIVE SEED IMPLANT N/A 11/24/2012    Procedure: RADIOACTIVE SEED IMPLANT;  Surgeon: Valetta Fuller, MD;  Location: [facility];  Service: Urology;  Laterality: N/A;   71seeds implanted  no seeds found in bladder   THROAT SURGERY   1940's    as child (age 64)growth that created fissure/corrective surgery   TONSILLECTOMY        age 12   XI ROBOTIC ASSISTED INGUINAL HERNIA REPAIR WITH MESH Right 05/08/2016    Procedure: XI ROBOTIC ASSISTED REPAIR OF RECURRENT RIGHT INGUINAL HERNIA;  Surgeon: Luretha Murphy, MD;  Location: [facility];  Service: General;  Laterality: Right;  With MESH          Tobacco Use History  Social History        Tobacco Use  Smoking Status Never   Passive exposure: Never  Smokeless Tobacco Never      Social History       Substance and Sexual Activity  Alcohol Use No      Social History         Socioeconomic History    Marital status: Married      Spouse name: Not on file   Number of children: 2   Years of education: Not on file   Highest education level: Not on file  Occupational History   Occupation: Retired      Comment: Designer, fashion/clothing  Tobacco Use   Smoking status: Never      Passive exposure: Never   Smokeless tobacco: Never  Vaping Use   Vaping status: Never Used  Substance and Sexual Activity   Alcohol use: No   Drug use: No   Sexual activity: Not on file  Other Topics Concern   Not on file  Social History Narrative   Not on file    Social Determinants of Health    Financial Resource Strain: Not on file  Food Insecurity: Not on file  Transportation Needs: Not on file  Physical Activity: Not on file  Stress: Not on file  Social Connections: Not on file  Intimate Partner Violence: Not on file      Allergies       Allergies  Allergen Reactions   Penicillins Anaphylaxis, Shortness Of Breath, Itching and Swelling            Iodinated Contrast Media Hives and Rash   Aspirin Other (See Comments)      GI bleeding-avoids     Monodox [Doxycycline Hyclate] Nausea Only      "made me feel sick"   Sulfa Antibiotics Itching and Swelling   Tramadol Nausea And Vomiting              Current Outpatient Medications  Medication Sig Dispense Refill   albuterol (PROVENTIL HFA;VENTOLIN HFA) 108 (90 BASE) MCG/ACT inhaler Inhale 2 puffs into the lungs See admin instructions. Inhale 2 puffs every night, may inhale 2 puffs every 6 hours as needed for shortness of breath       bisoprolol-hydrochlorothiazide (ZIAC) 10-6.25 MG per tablet Take 1 tablet by mouth every morning.        budesonide-formoterol (SYMBICORT)  160-4.5 MCG/ACT inhaler Inhale 2 puffs into the lungs 2 (two) times daily.       clopidogrel (PLAVIX) 75 MG tablet Take 1 tablet (75 mg total) by mouth daily. 30 tablet 11   cyanocobalamin (,VITAMIN B-12,) 1000 MCG/ML injection Inject 1 mL into the skin every 30 (thirty) days.        dicyclomine (BENTYL) 10 MG capsule Take 10 mg by mouth 2 (two) times daily as needed for cramping.       fexofenadine (ALLEGRA) 180 MG tablet Take 180 mg by mouth daily.       fluticasone (FLONASE) 50 MCG/ACT nasal spray Place 1 spray into both nostrils daily.        furosemide (LASIX) 20 MG tablet Take 10-20 mg by mouth daily as needed for fluid or edema.       glimepiride (AMARYL) 2 MG tablet Take 2 mg by mouth daily.       isosorbide mononitrate (IMDUR) 30 MG 24 hr tablet Take 1 tablet (30 mg total) by mouth daily. 90 tablet 1   levothyroxine (SYNTHROID) 75 MCG tablet Take 75 mcg by mouth daily before breakfast.       Multiple Vitamin (MULTIVITAMIN WITH MINERALS) TABS tablet Take 1 tablet by mouth daily.       nitroGLYCERIN (NITROSTAT) 0.4 MG SL tablet DISSOLVE 1 TABLET UNDER THE TONGUE EVERY 5 MINUTES AS NEEDED FOR CHEST PAIN. DO NOT EXCEED A TOTAL OF 3 DOSES IN 15 MINUTES. 25 tablet 3   omeprazole (PRILOSEC) 20 MG capsule Take 20 mg by mouth daily.       predniSONE (DELTASONE) 50 MG tablet Prednisone 50 mg - take 13 hours prior to test Take another Prednisone 50 mg 7 hours prior to test Take another Prednisone 50 mg 1 hour prior to test Take Benadryl 50 mg 1 hour prior to test Patient must complete all four doses of above prophylactic medications. Patient will need a ride after test due to Benadryl. 3 tablet 0   rosuvastatin (CRESTOR) 20 MG tablet Take 1 tablet (20 mg total) by mouth daily. 90 tablet 1   solifenacin (VESICARE) 10 MG tablet Take 10 mg by mouth daily.       Tamsulosin HCl (FLOMAX) 0.4 MG CAPS Take 0.4 mg by mouth 2 (two) times daily.           No current facility-administered medications for this visit.               Family History  Problem Relation Age of Onset   Heart attack Other     Brain cancer Other     Coronary artery disease Other     Prostate cancer Other                  Physical Exam: Teeth in good repair Lungs: clear Card: RR with 3/6 diastolic  murmur Ext: no edema Neuro: alert no focal deficitis         Diagnostic Studies & Laboratory data: I have personally reviewed the following studies and agree with the findings    TTE (06/2022) IMPRESSIONS     1. GLS -12.9. Left ventricular ejection fraction, by estimation, is 60 to  65%. The left ventricle has normal function. The left ventricle has no  regional wall motion abnormalities. Left ventricular diastolic parameters  are consistent with Grade I  diastolic dysfunction (impaired relaxation).   2. Right ventricular systolic function is normal. The right ventricular  size is normal. There is normal  pulmonary artery systolic pressure.   3. The mitral valve is normal in structure. No evidence of mitral valve  regurgitation. No evidence of mitral stenosis.   4. EF still 60-65%, normal LV size. The aortic valve is calcified. There  is moderate calcification of the aortic valve. There is mild thickening of  the aortic valve. Aortic valve regurgitation is moderate to severe. Mild  to moderate aortic valve  stenosis. Aortic valve mean gradient measures 14.0 mmHg.   5. The inferior vena cava is normal in size with greater than 50%  respiratory variability, suggesting right atrial pressure of 3 mmHg.   FINDINGS   Left Ventricle: GLS -12.9. Left ventricular ejection fraction, by  estimation, is 60 to 65%. The left ventricle has normal function. The left  ventricle has no regional wall motion abnormalities. The left ventricular  internal cavity size was normal in  size. There is no left ventricular hypertrophy. Left ventricular diastolic  parameters are consistent with Grade I diastolic dysfunction (impaired  relaxation).   Right Ventricle: The right ventricular size is normal. No increase in  right ventricular wall thickness. Right ventricular systolic function is  normal. There is normal pulmonary artery systolic pressure. The tricuspid  regurgitant velocity is 2.55 m/s, and    with an assumed right atrial pressure of 3 mmHg, the estimated right  ventricular systolic pressure is 29.0 mmHg.   Left Atrium: Left atrial size was normal in size.   Right Atrium: Right atrial size was normal in size.   Pericardium: There is no evidence of pericardial effusion.   Mitral Valve: The mitral valve is normal in structure. No evidence of  mitral valve regurgitation. No evidence of mitral valve stenosis.   Tricuspid Valve: The tricuspid valve is normal in structure. Tricuspid  valve regurgitation is mild . No evidence of tricuspid stenosis.   Aortic Valve: EF still 60-65%, normal LV size. The aortic valve is  calcified. There is moderate calcification of the aortic valve. There is  mild thickening of the aortic valve. Aortic valve regurgitation is  moderate to severe. Aortic regurgitation PHT  measures 314 msec. Mild to moderate aortic stenosis is present. Aortic  valve mean gradient measures 14.0 mmHg. Aortic valve peak gradient  measures 26.1 mmHg. Aortic valve area, by VTI measures 1.11 cm.   Pulmonic Valve: The pulmonic valve was normal in structure. Pulmonic valve  regurgitation is not visualized. No evidence of pulmonic stenosis.   Aorta: The aortic root is normal in size and structure.   Venous: The inferior vena cava is normal in size with greater than 50%  respiratory variability, suggesting right atrial pressure of 3 mmHg.   IAS/Shunts: No atrial level shunt detected by color flow Doppler.     LEFT VENTRICLE  PLAX 2D  LVIDd:         4.30 cm   Diastology  LVIDs:         2.50 cm   LV e' medial:    4.03 cm/s  LV PW:         1.00 cm   LV E/e' medial:  19.0  LV IVS:        1.40 cm   LV e' lateral:   7.29 cm/s  LVOT diam:     1.80 cm   LV E/e' lateral: 10.5  LV SV:         58  LV SV Index:   32  LVOT Area:     2.54 cm  RIGHT VENTRICLE             IVC  RV S prime:     10.30 cm/s  IVC diam: 1.20 cm  TAPSE (M-mode): 2.2 cm   LEFT ATRIUM              Index        RIGHT ATRIUM          Index  LA diam:        3.50 cm 1.95 cm/m   RA Area:     9.45 cm  LA Vol (A2C):   48.2 ml 26.84 ml/m  RA Volume:   17.10 ml 9.52 ml/m  LA Vol (A4C):   54.0 ml 30.07 ml/m  LA Biplane Vol: 51.6 ml 28.74 ml/m   AORTIC VALVE  AV Area (Vmax):    0.99 cm  AV Area (Vmean):   1.01 cm  AV Area (VTI):     1.11 cm  AV Vmax:           255.50 cm/s  AV Vmean:          168.500 cm/s  AV VTI:            0.525 m  AV Peak Grad:      26.1 mmHg  AV Mean Grad:      14.0 mmHg  LVOT Vmax:         99.80 cm/s  LVOT Vmean:        66.800 cm/s  LVOT VTI:          0.228 m  LVOT/AV VTI ratio: 0.43  AI PHT:            314 msec    AORTA  Ao Root diam: 2.30 cm  Ao Asc diam:  3.00 cm   MITRAL VALVE               TRICUSPID VALVE  MV Area (PHT): 3.74 cm    TR Peak grad:   26.0 mmHg  MV Decel Time: 203 msec    TR Vmax:        255.00 cm/s  MV E velocity: 76.70 cm/s  MV A velocity: 83.60 cm/s  SHUNTS  MV E/A ratio:  0.92        Systemic VTI:  0.23 m                             Systemic Diam: 1.80 cm      Cardiac Cath (04/2023) Conclusion       Prox RCA to Mid RCA lesion is 50% stenosed.   Mid LM to Dist LM lesion is 30% stenosed.   Prox Cx to Dist Cx lesion is 50% stenosed.   Prox LAD to Mid LAD lesion is 50% stenosed.   1st Diag lesion is 60% stenosed.   There is severe (4+) aortic regurgitation.   1.  Moderate multivessel coronary artery disease with mild distal left main plaquing of 30%, moderate diffuse LAD stenosis of 50%, mild to moderate mid circumflex stenosis of 40 to 50%, and moderate mid RCA stenosis of 50%. 2.  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  Severe right subclavian tortuosity makes cardiac catheterization very difficult from right radial access.  If patient requires future cardiac catheterization, avoid right radial access.   Coronary CTA FFR (03/2023)  FINDINGS: FFRct analysis was performed on the original  cardiac CT angiogram dataset. Diagrammatic representation of  the FFRct analysis is provided in a separate PDF document in PACS. This dictation was created using the PDF document and an interactive 3D model of the results. 3D model is not available in the EMR/PACS. Normal FFR range is >0.80.   1. Left Main: Normal   2. LAD: Non diagnostic. Proximal FFR = 0.94. The mid and distal LAD are not modeled.   3. LCX: Possible flow limiting. proximal FFR = 0.96, Mid FFR = 0.86, Distal FFR = 0.77.   4. RCA: Normal. Proximal FFR = 0.99, Mid FFR = 0.91, Distal FFR = 0.87.   IMPRESSION: 1. Coronary CTA FFR analysis demonstrates possible flow limiting lesion in the mid to distal LCx (0.97>0.86>0.77) which corresponds to a moderate to possibly severe calcified lesion in the mid LCx by CTA. The LAD could not be modeled past the proximal portion. CTA showed a possible significant lesion in the mid LAD.   2. Recommend cardiac catheterization.    Recent Radiology Findings:        Recent Lab Findings: Recent Labs       Lab Results  Component Value Date    WBC 6.7 05/02/2023    HGB 15.9 05/02/2023    HCT 48.2 05/02/2023    PLT 161 05/02/2023    GLUCOSE 131 (H) 05/02/2023    CHOL 95 (L) 03/21/2023    TRIG 137 03/21/2023    HDL 32 (L) 03/21/2023    LDLCALC 39 03/21/2023    ALT 25 12/09/2019    AST 25 12/09/2019    NA 140 05/02/2023    K 4.4 05/02/2023    CL 101 05/02/2023    CREATININE 1.09 05/02/2023    BUN 21 05/02/2023    CO2 25 05/02/2023    TSH 2.620 03/21/2023    INR 0.90 11/17/2012    HGBA1C 6.6 (H) 03/21/2023            Assessment / Plan:     Pt is an 82 yo male with NYHA class 2-3 symptoms of severe AI with normal LV function and moderate CAD. I agree that AVR is indicated and would place a LIMA to his LAD at that time. His other CAD not sufficiently bad for grafting. He and his family understand the risks and goals and recovery from surgery and wish to proceed  understanding his increased risk secondary to his age and history of CVA. Pt will have noncontrast CT of chest and be off plavix for 7 days prior to surgery planned on November 8.

## 2023-07-04 NOTE — Anesthesia Preprocedure Evaluation (Addendum)
Anesthesia Evaluation  Patient identified by MRN, date of birth, ID band Patient awake    Reviewed: Allergy & Precautions, NPO status , Patient's Chart, lab work & pertinent test results  History of Anesthesia Complications (+) PONV and history of anesthetic complications  Airway Mallampati: II  TM Distance: >3 FB Neck ROM: Full    Dental no notable dental hx.    Pulmonary asthma    Pulmonary exam normal        Cardiovascular hypertension, Pt. on medications and Pt. on home beta blockers + angina  + CAD  + Valvular Problems/Murmurs AI  Rhythm:Regular Rate:Normal + Diastolic murmurs ECHO:  1. GLS -12.9. Left ventricular ejection fraction, by estimation, is 60 to  65%. The left ventricle has normal function. The left ventricle has no  regional wall motion abnormalities. Left ventricular diastolic parameters  are consistent with Grade I  diastolic dysfunction (impaired relaxation).   2. Right ventricular systolic function is normal. The right ventricular  size is normal. There is normal pulmonary artery systolic pressure.   3. The mitral valve is normal in structure. No evidence of mitral valve  regurgitation. No evidence of mitral stenosis.   4. EF still 60-65%, normal LV size. The aortic valve is calcified. There  is moderate calcification of the aortic valve. There is mild thickening of  the aortic valve. Aortic valve regurgitation is moderate to severe. Mild  to moderate aortic valve  stenosis. Aortic valve mean gradient measures 14.0 mmHg.   5. The inferior vena cava is normal in size with greater than 50%  respiratory variability, suggesting right atrial pressure of 3 mmHg.    Cath:   Prox RCA to Mid RCA lesion is 50% stenosed.   Mid LM to Dist LM lesion is 30% stenosed.   Prox Cx to Dist Cx lesion is 50% stenosed.   Prox LAD to Mid LAD lesion is 50% stenosed.   1st Diag lesion is 60% stenosed.   There is severe  (4+) aortic regurgitation.    Neuro/Psych CVA  negative psych ROS   GI/Hepatic Neg liver ROS,GERD  Medicated,,  Endo/Other  diabetes, Type 2    Renal/GU negative Renal ROS  negative genitourinary   Musculoskeletal negative musculoskeletal ROS (+)    Abdominal Normal abdominal exam  (+)   Peds  Hematology  (+) Blood dyscrasia, anemia Lab Results      Component                Value               Date                      WBC                      8.7                 07/03/2023                HGB                      14.9                07/03/2023                HCT                      45.4  07/03/2023                MCV                      86.0                07/03/2023                PLT                      177                 07/03/2023             Lab Results      Component                Value               Date                      NA                       140                 07/03/2023                K                        3.6                 07/03/2023                CO2                      29                  07/03/2023                GLUCOSE                  81                  07/03/2023                BUN                      11                  07/03/2023                CREATININE               0.81                07/03/2023                CALCIUM                  9.4                 07/03/2023                EGFR                     67                  05/02/2023  GFRNONAA                 >60                 07/03/2023              Anesthesia Other Findings   Reproductive/Obstetrics                             Anesthesia Physical Anesthesia Plan  ASA: 4  Anesthesia Plan: General   Post-op Pain Management:    Induction: Intravenous  PONV Risk Score and Plan: 3 and Treatment may vary due to age or medical condition, Midazolam and Ondansetron  Airway Management Planned: Mask and Oral  ETT  Additional Equipment: Arterial line, CVP, PA Cath, TEE, 3D TEE and Ultrasound Guidance Line Placement  Intra-op Plan:   Post-operative Plan: Post-operative intubation/ventilation  Informed Consent: I have reviewed the patients History and Physical, chart, labs and discussed the procedure including the risks, benefits and alternatives for the proposed anesthesia with the patient or authorized representative who has indicated his/her understanding and acceptance.     Dental advisory given  Plan Discussed with: CRNA  Anesthesia Plan Comments: (- LEFT ARTERIAL LINE)       Anesthesia Quick Evaluation

## 2023-07-05 ENCOUNTER — Inpatient Hospital Stay (HOSPITAL_COMMUNITY): Payer: Medicare Other

## 2023-07-05 ENCOUNTER — Other Ambulatory Visit: Payer: Self-pay

## 2023-07-05 ENCOUNTER — Encounter (HOSPITAL_COMMUNITY)
Admission: RE | Disposition: A | Discharge status: Skilled Nursing Facility | Payer: Self-pay | Source: Home / Self Care | Attending: Thoracic Surgery (Cardiothoracic Vascular Surgery)

## 2023-07-05 ENCOUNTER — Inpatient Hospital Stay (HOSPITAL_COMMUNITY): Payer: Medicare Other | Admitting: Physician Assistant

## 2023-07-05 ENCOUNTER — Inpatient Hospital Stay (HOSPITAL_COMMUNITY): Payer: Medicare Other | Admitting: Anesthesiology

## 2023-07-05 ENCOUNTER — Encounter (HOSPITAL_COMMUNITY): Payer: Self-pay | Admitting: Thoracic Surgery (Cardiothoracic Vascular Surgery)

## 2023-07-05 ENCOUNTER — Inpatient Hospital Stay (HOSPITAL_COMMUNITY)
Admission: RE | Admit: 2023-07-05 | Discharge: 2023-07-20 | DRG: 219 | Disposition: A | Discharge status: Skilled Nursing Facility | Payer: Medicare Other | Attending: Thoracic Surgery (Cardiothoracic Vascular Surgery) | Admitting: Thoracic Surgery (Cardiothoracic Vascular Surgery)

## 2023-07-05 DIAGNOSIS — I639 Cerebral infarction, unspecified: Secondary | ICD-10-CM | POA: Diagnosis not present

## 2023-07-05 DIAGNOSIS — I2510 Atherosclerotic heart disease of native coronary artery without angina pectoris: Secondary | ICD-10-CM | POA: Diagnosis present

## 2023-07-05 DIAGNOSIS — Z882 Allergy status to sulfonamides status: Secondary | ICD-10-CM

## 2023-07-05 DIAGNOSIS — Z888 Allergy status to other drugs, medicaments and biological substances status: Secondary | ICD-10-CM

## 2023-07-05 DIAGNOSIS — E039 Hypothyroidism, unspecified: Secondary | ICD-10-CM | POA: Diagnosis present

## 2023-07-05 DIAGNOSIS — Z950 Presence of cardiac pacemaker: Secondary | ICD-10-CM | POA: Diagnosis not present

## 2023-07-05 DIAGNOSIS — D6959 Other secondary thrombocytopenia: Secondary | ICD-10-CM | POA: Diagnosis not present

## 2023-07-05 DIAGNOSIS — D62 Acute posthemorrhagic anemia: Secondary | ICD-10-CM | POA: Diagnosis not present

## 2023-07-05 DIAGNOSIS — I6349 Cerebral infarction due to embolism of other cerebral artery: Secondary | ICD-10-CM | POA: Diagnosis not present

## 2023-07-05 DIAGNOSIS — R471 Dysarthria and anarthria: Secondary | ICD-10-CM | POA: Diagnosis not present

## 2023-07-05 DIAGNOSIS — I10 Essential (primary) hypertension: Secondary | ICD-10-CM | POA: Diagnosis present

## 2023-07-05 DIAGNOSIS — Z01818 Encounter for other preprocedural examination: Secondary | ICD-10-CM

## 2023-07-05 DIAGNOSIS — Z8249 Family history of ischemic heart disease and other diseases of the circulatory system: Secondary | ICD-10-CM

## 2023-07-05 DIAGNOSIS — Z880 Allergy status to penicillin: Secondary | ICD-10-CM

## 2023-07-05 DIAGNOSIS — Z952 Presence of prosthetic heart valve: Secondary | ICD-10-CM

## 2023-07-05 DIAGNOSIS — I6340 Cerebral infarction due to embolism of unspecified cerebral artery: Secondary | ICD-10-CM | POA: Diagnosis not present

## 2023-07-05 DIAGNOSIS — Z79890 Hormone replacement therapy (postmenopausal): Secondary | ICD-10-CM | POA: Diagnosis not present

## 2023-07-05 DIAGNOSIS — I25119 Atherosclerotic heart disease of native coronary artery with unspecified angina pectoris: Secondary | ICD-10-CM | POA: Diagnosis not present

## 2023-07-05 DIAGNOSIS — M21371 Foot drop, right foot: Secondary | ICD-10-CM | POA: Diagnosis present

## 2023-07-05 DIAGNOSIS — I442 Atrioventricular block, complete: Secondary | ICD-10-CM

## 2023-07-05 DIAGNOSIS — Z8042 Family history of malignant neoplasm of prostate: Secondary | ICD-10-CM

## 2023-07-05 DIAGNOSIS — I959 Hypotension, unspecified: Secondary | ICD-10-CM | POA: Diagnosis present

## 2023-07-05 DIAGNOSIS — E119 Type 2 diabetes mellitus without complications: Secondary | ICD-10-CM | POA: Diagnosis present

## 2023-07-05 DIAGNOSIS — Z978 Presence of other specified devices: Secondary | ICD-10-CM

## 2023-07-05 DIAGNOSIS — J90 Pleural effusion, not elsewhere classified: Secondary | ICD-10-CM | POA: Diagnosis not present

## 2023-07-05 DIAGNOSIS — Z85828 Personal history of other malignant neoplasm of skin: Secondary | ICD-10-CM

## 2023-07-05 DIAGNOSIS — Z8546 Personal history of malignant neoplasm of prostate: Secondary | ICD-10-CM

## 2023-07-05 DIAGNOSIS — Z7984 Long term (current) use of oral hypoglycemic drugs: Secondary | ICD-10-CM

## 2023-07-05 DIAGNOSIS — I441 Atrioventricular block, second degree: Secondary | ICD-10-CM | POA: Diagnosis not present

## 2023-07-05 DIAGNOSIS — Z951 Presence of aortocoronary bypass graft: Secondary | ICD-10-CM

## 2023-07-05 DIAGNOSIS — I352 Nonrheumatic aortic (valve) stenosis with insufficiency: Secondary | ICD-10-CM | POA: Diagnosis not present

## 2023-07-05 DIAGNOSIS — J939 Pneumothorax, unspecified: Secondary | ICD-10-CM | POA: Diagnosis not present

## 2023-07-05 DIAGNOSIS — I97820 Postprocedural cerebrovascular infarction during cardiac surgery: Secondary | ICD-10-CM | POA: Diagnosis not present

## 2023-07-05 DIAGNOSIS — Z79899 Other long term (current) drug therapy: Secondary | ICD-10-CM

## 2023-07-05 DIAGNOSIS — K219 Gastro-esophageal reflux disease without esophagitis: Secondary | ICD-10-CM | POA: Diagnosis present

## 2023-07-05 DIAGNOSIS — F05 Delirium due to known physiological condition: Secondary | ICD-10-CM | POA: Diagnosis not present

## 2023-07-05 DIAGNOSIS — R29703 NIHSS score 3: Secondary | ICD-10-CM | POA: Diagnosis not present

## 2023-07-05 DIAGNOSIS — E871 Hypo-osmolality and hyponatremia: Secondary | ICD-10-CM | POA: Diagnosis not present

## 2023-07-05 DIAGNOSIS — J9811 Atelectasis: Secondary | ICD-10-CM | POA: Diagnosis not present

## 2023-07-05 DIAGNOSIS — Z8673 Personal history of transient ischemic attack (TIA), and cerebral infarction without residual deficits: Secondary | ICD-10-CM | POA: Diagnosis not present

## 2023-07-05 DIAGNOSIS — Z452 Encounter for adjustment and management of vascular access device: Secondary | ICD-10-CM | POA: Diagnosis not present

## 2023-07-05 DIAGNOSIS — Z0389 Encounter for observation for other suspected diseases and conditions ruled out: Secondary | ICD-10-CM | POA: Diagnosis not present

## 2023-07-05 DIAGNOSIS — I350 Nonrheumatic aortic (valve) stenosis: Secondary | ICD-10-CM | POA: Diagnosis not present

## 2023-07-05 DIAGNOSIS — E785 Hyperlipidemia, unspecified: Secondary | ICD-10-CM | POA: Diagnosis present

## 2023-07-05 DIAGNOSIS — Z9049 Acquired absence of other specified parts of digestive tract: Secondary | ICD-10-CM

## 2023-07-05 DIAGNOSIS — J984 Other disorders of lung: Secondary | ICD-10-CM | POA: Diagnosis not present

## 2023-07-05 DIAGNOSIS — Z7951 Long term (current) use of inhaled steroids: Secondary | ICD-10-CM

## 2023-07-05 DIAGNOSIS — Z7902 Long term (current) use of antithrombotics/antiplatelets: Secondary | ICD-10-CM

## 2023-07-05 DIAGNOSIS — Z808 Family history of malignant neoplasm of other organs or systems: Secondary | ICD-10-CM

## 2023-07-05 DIAGNOSIS — I480 Paroxysmal atrial fibrillation: Secondary | ICD-10-CM | POA: Diagnosis present

## 2023-07-05 DIAGNOSIS — Z4682 Encounter for fitting and adjustment of non-vascular catheter: Secondary | ICD-10-CM | POA: Diagnosis not present

## 2023-07-05 DIAGNOSIS — Z91041 Radiographic dye allergy status: Secondary | ICD-10-CM

## 2023-07-05 DIAGNOSIS — Z886 Allergy status to analgesic agent status: Secondary | ICD-10-CM | POA: Diagnosis not present

## 2023-07-05 DIAGNOSIS — I351 Nonrheumatic aortic (valve) insufficiency: Secondary | ICD-10-CM

## 2023-07-05 DIAGNOSIS — N179 Acute kidney failure, unspecified: Secondary | ICD-10-CM | POA: Diagnosis present

## 2023-07-05 DIAGNOSIS — Z85038 Personal history of other malignant neoplasm of large intestine: Secondary | ICD-10-CM

## 2023-07-05 DIAGNOSIS — Z48812 Encounter for surgical aftercare following surgery on the circulatory system: Secondary | ICD-10-CM | POA: Diagnosis not present

## 2023-07-05 DIAGNOSIS — R918 Other nonspecific abnormal finding of lung field: Secondary | ICD-10-CM | POA: Diagnosis not present

## 2023-07-05 DIAGNOSIS — R4182 Altered mental status, unspecified: Secondary | ICD-10-CM | POA: Diagnosis not present

## 2023-07-05 HISTORY — PX: CORONARY ARTERY BYPASS GRAFT: SHX141

## 2023-07-05 HISTORY — DX: Presence of prosthetic heart valve: Z95.2

## 2023-07-05 HISTORY — DX: Presence of aortocoronary bypass graft: Z95.1

## 2023-07-05 HISTORY — PX: TEE WITHOUT CARDIOVERSION: SHX5443

## 2023-07-05 HISTORY — PX: AORTIC VALVE REPLACEMENT: SHX41

## 2023-07-05 HISTORY — DX: Presence of other specified devices: Z97.8

## 2023-07-05 LAB — APTT: aPTT: 57 s — ABNORMAL HIGH (ref 24–36)

## 2023-07-05 LAB — CBC WITH DIFFERENTIAL/PLATELET
Abs Immature Granulocytes: 0.04 10*3/uL (ref 0.00–0.07)
Basophils Absolute: 0 10*3/uL (ref 0.0–0.1)
Basophils Relative: 0 %
Eosinophils Absolute: 0.2 10*3/uL (ref 0.0–0.5)
Eosinophils Relative: 2 %
HCT: 29.2 % — ABNORMAL LOW (ref 39.0–52.0)
Hemoglobin: 9.9 g/dL — ABNORMAL LOW (ref 13.0–17.0)
Immature Granulocytes: 0 %
Lymphocytes Relative: 15 %
Lymphs Abs: 1.4 10*3/uL (ref 0.7–4.0)
MCH: 28.2 pg (ref 26.0–34.0)
MCHC: 33.9 g/dL (ref 30.0–36.0)
MCV: 83.2 fL (ref 80.0–100.0)
Monocytes Absolute: 0.8 10*3/uL (ref 0.1–1.0)
Monocytes Relative: 8 %
Neutro Abs: 6.9 10*3/uL (ref 1.7–7.7)
Neutrophils Relative %: 75 %
Platelets: 87 10*3/uL — ABNORMAL LOW (ref 150–400)
RBC: 3.51 MIL/uL — ABNORMAL LOW (ref 4.22–5.81)
RDW: 14.1 % (ref 11.5–15.5)
WBC: 9.4 10*3/uL (ref 4.0–10.5)
nRBC: 0 % (ref 0.0–0.2)

## 2023-07-05 LAB — POCT I-STAT 7, (LYTES, BLD GAS, ICA,H+H)
Acid-Base Excess: 0 mmol/L (ref 0.0–2.0)
Acid-base deficit: 4 mmol/L — ABNORMAL HIGH (ref 0.0–2.0)
Acid-base deficit: 5 mmol/L — ABNORMAL HIGH (ref 0.0–2.0)
Acid-base deficit: 4 mmol/L — ABNORMAL HIGH (ref 0.0–2.0)
Bicarbonate: 23.6 mmol/L (ref 20.0–28.0)
Bicarbonate: 20 mmol/L (ref 20.0–28.0)
Bicarbonate: 20.2 mmol/L (ref 20.0–28.0)
Bicarbonate: 21.2 mmol/L (ref 20.0–28.0)
Calcium, Ion: 0.99 mmol/L — ABNORMAL LOW (ref 1.15–1.40)
Calcium, Ion: 1.14 mmol/L — ABNORMAL LOW (ref 1.15–1.40)
Calcium, Ion: 1.19 mmol/L (ref 1.15–1.40)
Calcium, Ion: 1.17 mmol/L (ref 1.15–1.40)
HCT: 26 % — ABNORMAL LOW (ref 39.0–52.0)
HCT: 24 % — ABNORMAL LOW (ref 39.0–52.0)
HCT: 29 % — ABNORMAL LOW (ref 39.0–52.0)
HCT: 28 % — ABNORMAL LOW (ref 39.0–52.0)
Hemoglobin: 8.8 g/dL — ABNORMAL LOW (ref 13.0–17.0)
Hemoglobin: 8.2 g/dL — ABNORMAL LOW (ref 13.0–17.0)
Hemoglobin: 9.9 g/dL — ABNORMAL LOW (ref 13.0–17.0)
Hemoglobin: 9.5 g/dL — ABNORMAL LOW (ref 13.0–17.0)
O2 Saturation: 100 %
O2 Saturation: 99 %
O2 Saturation: 94 %
O2 Saturation: 90 %
Patient temperature: 36.1
Patient temperature: 36.7
Patient temperature: 37.2
Potassium: 4.1 mmol/L (ref 3.5–5.1)
Potassium: 4.1 mmol/L (ref 3.5–5.1)
Potassium: 3.9 mmol/L (ref 3.5–5.1)
Potassium: 4.1 mmol/L (ref 3.5–5.1)
Sodium: 139 mmol/L (ref 135–145)
Sodium: 139 mmol/L (ref 135–145)
Sodium: 139 mmol/L (ref 135–145)
Sodium: 139 mmol/L (ref 135–145)
TCO2: 25 mmol/L (ref 22–32)
TCO2: 21 mmol/L — ABNORMAL LOW (ref 22–32)
TCO2: 21 mmol/L — ABNORMAL LOW (ref 22–32)
TCO2: 22 mmol/L (ref 22–32)
pCO2 arterial: 34 mm[Hg] (ref 32–48)
pCO2 arterial: 30.4 mm[Hg] — ABNORMAL LOW (ref 32–48)
pCO2 arterial: 37 mm[Hg] (ref 32–48)
pCO2 arterial: 37.4 mm[Hg] (ref 32–48)
pH, Arterial: 7.449 (ref 7.35–7.45)
pH, Arterial: 7.423 (ref 7.35–7.45)
pH, Arterial: 7.343 — ABNORMAL LOW (ref 7.35–7.45)
pH, Arterial: 7.363 (ref 7.35–7.45)
pO2, Arterial: 424 mm[Hg] — ABNORMAL HIGH (ref 83–108)
pO2, Arterial: 142 mm[Hg] — ABNORMAL HIGH (ref 83–108)
pO2, Arterial: 71 mm[Hg] — ABNORMAL LOW (ref 83–108)
pO2, Arterial: 60 mm[Hg] — ABNORMAL LOW (ref 83–108)

## 2023-07-05 LAB — POCT I-STAT, CHEM 8
BUN: 14 mg/dL (ref 8–23)
BUN: 13 mg/dL (ref 8–23)
BUN: 15 mg/dL (ref 8–23)
BUN: 12 mg/dL (ref 8–23)
BUN: 12 mg/dL (ref 8–23)
Calcium, Ion: 1.24 mmol/L (ref 1.15–1.40)
Calcium, Ion: 1.06 mmol/L — ABNORMAL LOW (ref 1.15–1.40)
Calcium, Ion: 1.21 mmol/L (ref 1.15–1.40)
Calcium, Ion: 0.97 mmol/L — ABNORMAL LOW (ref 1.15–1.40)
Calcium, Ion: 1.21 mmol/L (ref 1.15–1.40)
Chloride: 100 mmol/L (ref 98–111)
Chloride: 101 mmol/L (ref 98–111)
Chloride: 99 mmol/L (ref 98–111)
Chloride: 99 mmol/L (ref 98–111)
Chloride: 102 mmol/L (ref 98–111)
Creatinine, Ser: 1 mg/dL (ref 0.61–1.24)
Creatinine, Ser: 0.8 mg/dL (ref 0.61–1.24)
Creatinine, Ser: 0.9 mg/dL (ref 0.61–1.24)
Creatinine, Ser: 0.8 mg/dL (ref 0.61–1.24)
Creatinine, Ser: 0.8 mg/dL (ref 0.61–1.24)
Glucose, Bld: 122 mg/dL — ABNORMAL HIGH (ref 70–99)
Glucose, Bld: 155 mg/dL — ABNORMAL HIGH (ref 70–99)
Glucose, Bld: 165 mg/dL — ABNORMAL HIGH (ref 70–99)
Glucose, Bld: 139 mg/dL — ABNORMAL HIGH (ref 70–99)
Glucose, Bld: 134 mg/dL — ABNORMAL HIGH (ref 70–99)
HCT: 36 % — ABNORMAL LOW (ref 39.0–52.0)
HCT: 26 % — ABNORMAL LOW (ref 39.0–52.0)
HCT: 33 % — ABNORMAL LOW (ref 39.0–52.0)
HCT: 23 % — ABNORMAL LOW (ref 39.0–52.0)
HCT: 23 % — ABNORMAL LOW (ref 39.0–52.0)
Hemoglobin: 12.2 g/dL — ABNORMAL LOW (ref 13.0–17.0)
Hemoglobin: 11.2 g/dL — ABNORMAL LOW (ref 13.0–17.0)
Hemoglobin: 8.8 g/dL — ABNORMAL LOW (ref 13.0–17.0)
Hemoglobin: 7.8 g/dL — ABNORMAL LOW (ref 13.0–17.0)
Hemoglobin: 7.8 g/dL — ABNORMAL LOW (ref 13.0–17.0)
Potassium: 3.8 mmol/L (ref 3.5–5.1)
Potassium: 4.1 mmol/L (ref 3.5–5.1)
Potassium: 4.5 mmol/L (ref 3.5–5.1)
Potassium: 4.4 mmol/L (ref 3.5–5.1)
Potassium: 4.2 mmol/L (ref 3.5–5.1)
Sodium: 139 mmol/L (ref 135–145)
Sodium: 134 mmol/L — ABNORMAL LOW (ref 135–145)
Sodium: 138 mmol/L (ref 135–145)
Sodium: 136 mmol/L (ref 135–145)
Sodium: 138 mmol/L (ref 135–145)
TCO2: 27 mmol/L (ref 22–32)
TCO2: 23 mmol/L (ref 22–32)
TCO2: 26 mmol/L (ref 22–32)
TCO2: 24 mmol/L (ref 22–32)
TCO2: 23 mmol/L (ref 22–32)

## 2023-07-05 LAB — POCT I-STAT EG7
Acid-base deficit: 1 mmol/L (ref 0.0–2.0)
Bicarbonate: 23.4 mmol/L (ref 20.0–28.0)
Calcium, Ion: 1.02 mmol/L — ABNORMAL LOW (ref 1.15–1.40)
HCT: 26 % — ABNORMAL LOW (ref 39.0–52.0)
Hemoglobin: 8.8 g/dL — ABNORMAL LOW (ref 13.0–17.0)
O2 Saturation: 77 %
Potassium: 4.1 mmol/L (ref 3.5–5.1)
Sodium: 140 mmol/L (ref 135–145)
TCO2: 25 mmol/L (ref 22–32)
pCO2, Ven: 35.3 mm[Hg] — ABNORMAL LOW (ref 44–60)
pH, Ven: 7.431 — ABNORMAL HIGH (ref 7.25–7.43)
pO2, Ven: 40 mm[Hg] (ref 32–45)

## 2023-07-05 LAB — BASIC METABOLIC PANEL
Anion gap: 9 (ref 5–15)
Anion gap: 6 (ref 5–15)
BUN: 11 mg/dL (ref 8–23)
BUN: 12 mg/dL (ref 8–23)
CO2: 20 mmol/L — ABNORMAL LOW (ref 22–32)
CO2: 22 mmol/L (ref 22–32)
Calcium: 7.8 mg/dL — ABNORMAL LOW (ref 8.9–10.3)
Calcium: 8.2 mg/dL — ABNORMAL LOW (ref 8.9–10.3)
Chloride: 109 mmol/L (ref 98–111)
Chloride: 108 mmol/L (ref 98–111)
Creatinine, Ser: 0.99 mg/dL (ref 0.61–1.24)
Creatinine, Ser: 0.91 mg/dL (ref 0.61–1.24)
GFR, Estimated: >60 mL/min (ref >60)
GFR, Estimated: >60 mL/min (ref >60)
Glucose, Bld: 125 mg/dL — ABNORMAL HIGH (ref 70–99)
Glucose, Bld: 133 mg/dL — ABNORMAL HIGH (ref 70–99)
Potassium: 4 mmol/L (ref 3.5–5.1)
Potassium: 4.2 mmol/L (ref 3.5–5.1)
Sodium: 138 mmol/L (ref 135–145)
Sodium: 136 mmol/L (ref 135–145)

## 2023-07-05 LAB — CBC
HCT: 26.9 % — ABNORMAL LOW (ref 39.0–52.0)
Hemoglobin: 8.8 g/dL — ABNORMAL LOW (ref 13.0–17.0)
MCH: 27.9 pg (ref 26.0–34.0)
MCHC: 32.7 g/dL (ref 30.0–36.0)
MCV: 85.4 fL (ref 80.0–100.0)
Platelets: 102 10*3/uL — ABNORMAL LOW (ref 150–400)
RBC: 3.15 MIL/uL — ABNORMAL LOW (ref 4.22–5.81)
RDW: 13.1 % (ref 11.5–15.5)
WBC: 8 10*3/uL (ref 4.0–10.5)
nRBC: 0 % (ref 0.0–0.2)

## 2023-07-05 LAB — GLUCOSE, CAPILLARY
Glucose-Capillary: 117 mg/dL — ABNORMAL HIGH (ref 70–99)
Glucose-Capillary: 116 mg/dL — ABNORMAL HIGH (ref 70–99)
Glucose-Capillary: 116 mg/dL — ABNORMAL HIGH (ref 70–99)
Glucose-Capillary: 116 mg/dL — ABNORMAL HIGH (ref 70–99)
Glucose-Capillary: 132 mg/dL — ABNORMAL HIGH (ref 70–99)
Glucose-Capillary: 139 mg/dL — ABNORMAL HIGH (ref 70–99)
Glucose-Capillary: 141 mg/dL — ABNORMAL HIGH (ref 70–99)
Glucose-Capillary: 128 mg/dL — ABNORMAL HIGH (ref 70–99)
Glucose-Capillary: 156 mg/dL — ABNORMAL HIGH (ref 70–99)

## 2023-07-05 LAB — HEMOGLOBIN AND HEMATOCRIT, BLOOD
HCT: 24 % — ABNORMAL LOW (ref 39.0–52.0)
Hemoglobin: 7.9 g/dL — ABNORMAL LOW (ref 13.0–17.0)

## 2023-07-05 LAB — PROTIME-INR
INR: 1.8 — ABNORMAL HIGH (ref 0.8–1.2)
Prothrombin Time: 21 s — ABNORMAL HIGH (ref 11.4–15.2)

## 2023-07-05 LAB — BLOOD GAS, ARTERIAL
Acid-Base Excess: 0.3 mmol/L (ref 0.0–2.0)
Bicarbonate: 26.5 mmol/L (ref 20.0–28.0)
Drawn by: 34487
O2 Saturation: 97.7 %
Patient temperature: 37
pCO2 arterial: 48 mm[Hg] (ref 32–48)
pH, Arterial: 7.35 (ref 7.35–7.45)
pO2, Arterial: 115 mm[Hg] — ABNORMAL HIGH (ref 83–108)

## 2023-07-05 LAB — PREPARE RBC (CROSSMATCH)

## 2023-07-05 LAB — PLATELET COUNT: Platelets: 110 10*3/uL — ABNORMAL LOW (ref 150–400)

## 2023-07-05 SURGERY — REPLACEMENT, AORTIC VALVE, OPEN
Anesthesia: General | Site: Chest

## 2023-07-05 MED ORDER — PANTOPRAZOLE SODIUM 40 MG PO TBEC
40.0000 mg | DELAYED_RELEASE_TABLET | Freq: Every day | ORAL | Status: DC
  Administered 2023-07-07 – 2023-07-20 (×14): 40 mg via ORAL
  Filled 2023-07-05 (×13): qty 1

## 2023-07-05 MED ORDER — LACTATED RINGERS IV SOLN: INTRAVENOUS | Status: AC

## 2023-07-05 MED ORDER — HEPARIN SODIUM (PORCINE) 1000 UNIT/ML IJ SOLN
INTRAMUSCULAR | Status: DC | PRN
  Administered 2023-07-05: 24000 [IU] via INTRAVENOUS

## 2023-07-05 MED ORDER — FENTANYL CITRATE PF 50 MCG/ML IJ SOSY: 50.0000 ug | PREFILLED_SYRINGE | INTRAMUSCULAR | Status: DC | PRN

## 2023-07-05 MED ORDER — PAPAVERINE HCL 30 MG/ML IJ SOLN
INTRAMUSCULAR | Status: DC | PRN
  Administered 2023-07-05: 60 mg

## 2023-07-05 MED ORDER — METOCLOPRAMIDE HCL 5 MG/ML IJ SOLN
10.0000 mg | Freq: Four times a day (QID) | INTRAMUSCULAR | Status: AC
  Administered 2023-07-05 – 2023-07-06 (×6): 10 mg via INTRAVENOUS
  Filled 2023-07-05 (×6): qty 2

## 2023-07-05 MED ORDER — BISACODYL 5 MG PO TBEC
10.0000 mg | DELAYED_RELEASE_TABLET | Freq: Every day | ORAL | Status: DC
  Administered 2023-07-06 – 2023-07-11 (×6): 10 mg via ORAL
  Filled 2023-07-05 (×6): qty 2

## 2023-07-05 MED ORDER — SODIUM CHLORIDE 0.9 % IV SOLN: 250.0000 mL | INTRAVENOUS | Status: DC

## 2023-07-05 MED ORDER — CLEVIDIPINE BUTYRATE 0.5 MG/ML IV EMUL
0.0000 mg/h | INTRAVENOUS | Status: DC
  Administered 2023-07-05: 5 mg/h via INTRAVENOUS
  Administered 2023-07-05: 2 mg/h via INTRAVENOUS
  Administered 2023-07-05: 12 mg/h via INTRAVENOUS
  Administered 2023-07-06: 18 mg/h via INTRAVENOUS
  Administered 2023-07-06 (×2): 16 mg/h via INTRAVENOUS
  Filled 2023-07-05 (×5): qty 100

## 2023-07-05 MED ORDER — ALBUTEROL SULFATE (2.5 MG/3ML) 0.083% IN NEBU: 2.5000 mg | INHALATION_SOLUTION | Freq: Four times a day (QID) | RESPIRATORY_TRACT | Status: DC | PRN

## 2023-07-05 MED ORDER — DICYCLOMINE HCL 10 MG PO CAPS: 10.0000 mg | ORAL_CAPSULE | Freq: Two times a day (BID) | ORAL | Status: DC | PRN

## 2023-07-05 MED ORDER — LEVOFLOXACIN IN D5W 750 MG/150ML IV SOLN
750.0000 mg | INTRAVENOUS | Status: AC
  Administered 2023-07-06: 750 mg via INTRAVENOUS
  Filled 2023-07-05: qty 150

## 2023-07-05 MED ORDER — ASPIRIN 325 MG PO TBEC
325.0000 mg | DELAYED_RELEASE_TABLET | Freq: Every day | ORAL | Status: DC
  Administered 2023-07-06 – 2023-07-07 (×2): 325 mg via ORAL
  Filled 2023-07-05 (×2): qty 1

## 2023-07-05 MED ORDER — SODIUM CHLORIDE 0.45 % IV SOLN: INTRAVENOUS | Status: AC | PRN

## 2023-07-05 MED ORDER — SODIUM CHLORIDE 0.9% FLUSH
3.0000 mL | Freq: Two times a day (BID) | INTRAVENOUS | Status: DC
  Administered 2023-07-05 – 2023-07-09 (×8): 3 mL via INTRAVENOUS

## 2023-07-05 MED ORDER — CHLORHEXIDINE GLUCONATE 4 % EX SOLN: 30.0000 mL | CUTANEOUS | Status: DC

## 2023-07-05 MED ORDER — ORAL CARE MOUTH RINSE
15.0000 mL | OROMUCOSAL | Status: DC
  Administered 2023-07-05 (×2): 15 mL via OROMUCOSAL

## 2023-07-05 MED ORDER — ALBUMIN HUMAN 5 % IV SOLN: INTRAVENOUS | Status: DC | PRN

## 2023-07-05 MED ORDER — PROPOFOL 500 MG/50ML IV EMUL
INTRAVENOUS | Status: DC | PRN
  Administered 2023-07-05: 50 ug/kg/min via INTRAVENOUS

## 2023-07-05 MED ORDER — SODIUM CHLORIDE 0.9% FLUSH
3.0000 mL | INTRAVENOUS | Status: DC | PRN
Start: 2023-07-06 — End: 2023-07-20

## 2023-07-05 MED ORDER — METOPROLOL TARTRATE 12.5 MG HALF TABLET
12.5000 mg | ORAL_TABLET | Freq: Two times a day (BID) | ORAL | Status: DC
  Administered 2023-07-05: 12.5 mg via ORAL
  Filled 2023-07-05: qty 1

## 2023-07-05 MED ORDER — MOMETASONE FURO-FORMOTEROL FUM 200-5 MCG/ACT IN AERO: 2.0000 | INHALATION_SPRAY | Freq: Two times a day (BID) | RESPIRATORY_TRACT | Status: DC

## 2023-07-05 MED ORDER — ORAL CARE MOUTH RINSE: 15.0000 mL | OROMUCOSAL | Status: DC | PRN

## 2023-07-05 MED ORDER — LACTATED RINGERS IV SOLN: INTRAVENOUS | Status: DC | PRN

## 2023-07-05 MED ORDER — ONDANSETRON HCL 4 MG/2ML IJ SOLN
INTRAMUSCULAR | Status: DC | PRN
  Administered 2023-07-05: 4 mg via INTRAVENOUS

## 2023-07-05 MED ORDER — PROPOFOL 10 MG/ML IV BOLUS
INTRAVENOUS | Status: AC
  Filled 2023-07-05: qty 20

## 2023-07-05 MED ORDER — MIDAZOLAM HCL (PF) 10 MG/2ML IJ SOLN
INTRAMUSCULAR | Status: AC
  Filled 2023-07-05: qty 2

## 2023-07-05 MED ORDER — ~~LOC~~ CARDIAC SURGERY, PATIENT & FAMILY EDUCATION
Freq: Once | Status: DC
  Filled 2023-07-05: qty 1

## 2023-07-05 MED ORDER — SODIUM CHLORIDE 0.9% FLUSH
10.0000 mL | Freq: Two times a day (BID) | INTRAVENOUS | Status: DC
  Administered 2023-07-05 – 2023-07-08 (×6): 10 mL

## 2023-07-05 MED ORDER — DEXTROSE 50 % IV SOLN: 0.0000 mL | INTRAVENOUS | Status: DC | PRN

## 2023-07-05 MED ORDER — PROPOFOL 1000 MG/100ML IV EMUL
INTRAVENOUS | Status: AC
  Filled 2023-07-05: qty 100

## 2023-07-05 MED ORDER — DEXMEDETOMIDINE HCL IN NACL 400 MCG/100ML IV SOLN
0.0000 ug/kg/h | INTRAVENOUS | Status: DC
Start: 2023-07-05 — End: 2023-07-06
  Administered 2023-07-05: 0.5 ug/kg/h via INTRAVENOUS
  Filled 2023-07-05: qty 100

## 2023-07-05 MED ORDER — NITROGLYCERIN IN D5W 200-5 MCG/ML-% IV SOLN: 0.0000 ug/min | INTRAVENOUS | Status: DC

## 2023-07-05 MED ORDER — PLASMA-LYTE A IV SOLN
INTRAVENOUS | Status: DC | PRN
  Administered 2023-07-05: 500 mL

## 2023-07-05 MED ORDER — ROSUVASTATIN CALCIUM 20 MG PO TABS
20.0000 mg | ORAL_TABLET | Freq: Every day | ORAL | Status: DC
  Administered 2023-07-06 – 2023-07-20 (×15): 20 mg via ORAL
  Filled 2023-07-05 (×15): qty 1

## 2023-07-05 MED ORDER — FLUTICASONE PROPIONATE 50 MCG/ACT NA SUSP
1.0000 | Freq: Every day | NASAL | Status: DC
Start: 2023-07-06 — End: 2023-07-20
  Administered 2023-07-06 – 2023-07-20 (×15): 1 via NASAL
  Filled 2023-07-05 (×3): qty 16

## 2023-07-05 MED ORDER — ACETAMINOPHEN 160 MG/5ML PO SOLN
650.0000 mg | Freq: Once | ORAL | Status: AC
  Administered 2023-07-05: 650 mg
  Filled 2023-07-05: qty 20.3

## 2023-07-05 MED ORDER — CHLORHEXIDINE GLUCONATE 0.12 % MT SOLN
15.0000 mL | Freq: Once | OROMUCOSAL | Status: DC
Start: 2023-07-05 — End: 2023-07-05

## 2023-07-05 MED ORDER — ALBUTEROL SULFATE (2.5 MG/3ML) 0.083% IN NEBU: INHALATION_SOLUTION | Freq: Four times a day (QID) | RESPIRATORY_TRACT | Status: DC | PRN

## 2023-07-05 MED ORDER — METOPROLOL TARTRATE 5 MG/5ML IV SOLN
2.5000 mg | INTRAVENOUS | Status: DC | PRN
  Administered 2023-07-07: 2.5 mg via INTRAVENOUS
  Administered 2023-07-09 – 2023-07-11 (×2): 5 mg via INTRAVENOUS
  Filled 2023-07-05 (×3): qty 5

## 2023-07-05 MED ORDER — FESOTERODINE FUMARATE ER 4 MG PO TB24
4.0000 mg | ORAL_TABLET | Freq: Every day | ORAL | Status: DC
  Administered 2023-07-06 – 2023-07-20 (×15): 4 mg via ORAL
  Filled 2023-07-05 (×15): qty 1

## 2023-07-05 MED ORDER — PROTAMINE SULFATE 10 MG/ML IV SOLN
INTRAVENOUS | Status: DC | PRN
  Administered 2023-07-05: 220 mg via INTRAVENOUS
  Administered 2023-07-05: 20 mg via INTRAVENOUS

## 2023-07-05 MED ORDER — ALBUMIN HUMAN 5 % IV SOLN
250.0000 mL | INTRAVENOUS | Status: AC | PRN
  Administered 2023-07-05 – 2023-07-06 (×5): 12.5 g via INTRAVENOUS
  Filled 2023-07-05 (×3): qty 250

## 2023-07-05 MED ORDER — DOCUSATE SODIUM 100 MG PO CAPS
200.0000 mg | ORAL_CAPSULE | Freq: Every day | ORAL | Status: DC
  Administered 2023-07-06 – 2023-07-11 (×6): 200 mg via ORAL
  Filled 2023-07-05 (×6): qty 2

## 2023-07-05 MED ORDER — POTASSIUM CHLORIDE 10 MEQ/50ML IV SOLN: 10.0000 meq | INTRAVENOUS | Status: AC

## 2023-07-05 MED ORDER — SODIUM CHLORIDE 0.9% IV SOLUTION: Freq: Once | INTRAVENOUS | Status: AC

## 2023-07-05 MED ORDER — ACETAMINOPHEN 160 MG/5ML PO SOLN
1000.0000 mg | Freq: Four times a day (QID) | ORAL | Status: AC
  Administered 2023-07-07: 1000 mg

## 2023-07-05 MED ORDER — PANTOPRAZOLE SODIUM 40 MG IV SOLR
40.0000 mg | Freq: Every day | INTRAVENOUS | Status: AC
  Administered 2023-07-05 – 2023-07-06 (×2): 40 mg via INTRAVENOUS
  Filled 2023-07-05 (×2): qty 10

## 2023-07-05 MED ORDER — BUDESONIDE 0.25 MG/2ML IN SUSP
0.2500 mg | Freq: Two times a day (BID) | RESPIRATORY_TRACT | Status: DC
  Administered 2023-07-05 – 2023-07-20 (×27): 0.25 mg via RESPIRATORY_TRACT
  Filled 2023-07-05 (×30): qty 2

## 2023-07-05 MED ORDER — VANCOMYCIN HCL 1000 MG IV SOLR
INTRAVENOUS | Status: DC | PRN
  Administered 2023-07-05: 1000 mL

## 2023-07-05 MED ORDER — ASPIRIN 81 MG PO CHEW: 324.0000 mg | CHEWABLE_TABLET | Freq: Every day | ORAL | Status: DC

## 2023-07-05 MED ORDER — PROPOFOL 1000 MG/100ML IV EMUL: 5.0000 ug/kg/min | INTRAVENOUS | Status: DC

## 2023-07-05 MED ORDER — ORAL CARE MOUTH RINSE: 15.0000 mL | Freq: Once | OROMUCOSAL | Status: AC

## 2023-07-05 MED ORDER — PHENYLEPHRINE 80 MCG/ML (10ML) SYRINGE FOR IV PUSH (FOR BLOOD PRESSURE SUPPORT)
PREFILLED_SYRINGE | INTRAVENOUS | Status: DC | PRN
  Administered 2023-07-05 (×2): 40 ug via INTRAVENOUS
  Administered 2023-07-05: 80 ug via INTRAVENOUS
  Administered 2023-07-05 (×5): 40 ug via INTRAVENOUS
  Administered 2023-07-05: 80 ug via INTRAVENOUS
  Administered 2023-07-05: 40 ug via INTRAVENOUS
  Administered 2023-07-05: 80 ug via INTRAVENOUS

## 2023-07-05 MED ORDER — LEVOTHYROXINE SODIUM 75 MCG PO TABS
75.0000 ug | ORAL_TABLET | Freq: Every day | ORAL | Status: DC
  Administered 2023-07-06 – 2023-07-20 (×15): 75 ug via ORAL
  Filled 2023-07-05 (×15): qty 1

## 2023-07-05 MED ORDER — CLEVIDIPINE BUTYRATE 0.5 MG/ML IV EMUL
INTRAVENOUS | Status: AC
  Filled 2023-07-05: qty 50

## 2023-07-05 MED ORDER — PHENYLEPHRINE 80 MCG/ML (10ML) SYRINGE FOR IV PUSH (FOR BLOOD PRESSURE SUPPORT)
PREFILLED_SYRINGE | INTRAVENOUS | Status: AC
  Filled 2023-07-05: qty 10

## 2023-07-05 MED ORDER — MIDAZOLAM HCL 2 MG/2ML IJ SOLN
2.0000 mg | INTRAMUSCULAR | Status: DC | PRN
Start: 2023-07-05 — End: 2023-07-07

## 2023-07-05 MED ORDER — PHENYLEPHRINE HCL-NACL 20-0.9 MG/250ML-% IV SOLN: 0.0000 ug/min | INTRAVENOUS | Status: DC

## 2023-07-05 MED ORDER — FENTANYL CITRATE (PF) 250 MCG/5ML IJ SOLN
INTRAMUSCULAR | Status: AC
  Filled 2023-07-05: qty 5

## 2023-07-05 MED ORDER — METOPROLOL TARTRATE 25 MG/10 ML ORAL SUSPENSION: 12.5000 mg | Freq: Two times a day (BID) | ORAL | Status: DC

## 2023-07-05 MED ORDER — CHLORHEXIDINE GLUCONATE CLOTH 2 % EX PADS
6.0000 | MEDICATED_PAD | Freq: Every day | CUTANEOUS | Status: DC
  Administered 2023-07-05 – 2023-07-08 (×4): 6 via TOPICAL

## 2023-07-05 MED ORDER — INSULIN REGULAR(HUMAN) IN NACL 100-0.9 UT/100ML-% IV SOLN: INTRAVENOUS | Status: DC

## 2023-07-05 MED ORDER — ACETAMINOPHEN 500 MG PO TABS
1000.0000 mg | ORAL_TABLET | Freq: Four times a day (QID) | ORAL | Status: AC
  Administered 2023-07-05 – 2023-07-10 (×14): 1000 mg via ORAL
  Filled 2023-07-05 (×15): qty 2

## 2023-07-05 MED ORDER — 0.9 % SODIUM CHLORIDE (POUR BTL) OPTIME
TOPICAL | Status: DC | PRN
  Administered 2023-07-05: 5000 mL

## 2023-07-05 MED ORDER — METOPROLOL TARTRATE 12.5 MG HALF TABLET: 12.5000 mg | ORAL_TABLET | Freq: Once | ORAL | Status: AC

## 2023-07-05 MED ORDER — PAPAVERINE HCL 30 MG/ML IJ SOLN
INTRAMUSCULAR | Status: AC
  Filled 2023-07-05: qty 2

## 2023-07-05 MED ORDER — ARFORMOTEROL TARTRATE 15 MCG/2ML IN NEBU
15.0000 ug | INHALATION_SOLUTION | Freq: Two times a day (BID) | RESPIRATORY_TRACT | Status: DC
  Administered 2023-07-05 – 2023-07-20 (×26): 15 ug via RESPIRATORY_TRACT
  Filled 2023-07-05 (×29): qty 2

## 2023-07-05 MED ORDER — ASPIRIN 81 MG PO CHEW
324.0000 mg | CHEWABLE_TABLET | Freq: Once | ORAL | Status: AC
  Administered 2023-07-05: 324 mg via ORAL
  Filled 2023-07-05: qty 4

## 2023-07-05 MED ORDER — ROCURONIUM BROMIDE 10 MG/ML (PF) SYRINGE
PREFILLED_SYRINGE | INTRAVENOUS | Status: AC
Start: 2023-07-05 — End: ?
  Filled 2023-07-05: qty 20

## 2023-07-05 MED ORDER — ONDANSETRON HCL 4 MG/2ML IJ SOLN
4.0000 mg | Freq: Four times a day (QID) | INTRAMUSCULAR | Status: DC | PRN
Start: 2023-07-05 — End: 2023-07-20
  Filled 2023-07-05: qty 2

## 2023-07-05 MED ORDER — MAGNESIUM SULFATE 4 GM/100ML IV SOLN
4.0000 g | Freq: Once | INTRAVENOUS | Status: AC
  Administered 2023-07-05: 4 g via INTRAVENOUS
  Filled 2023-07-05: qty 100

## 2023-07-05 MED ORDER — ROCURONIUM BROMIDE 10 MG/ML (PF) SYRINGE
PREFILLED_SYRINGE | INTRAVENOUS | Status: DC | PRN
  Administered 2023-07-05: 30 mg via INTRAVENOUS
  Administered 2023-07-05: 50 mg via INTRAVENOUS
  Administered 2023-07-05: 70 mg via INTRAVENOUS

## 2023-07-05 MED ORDER — TAMSULOSIN HCL 0.4 MG PO CAPS
0.4000 mg | ORAL_CAPSULE | Freq: Two times a day (BID) | ORAL | Status: DC
  Administered 2023-07-06 – 2023-07-20 (×29): 0.4 mg via ORAL
  Filled 2023-07-05 (×29): qty 1

## 2023-07-05 MED ORDER — CHLORHEXIDINE GLUCONATE 0.12 % MT SOLN: 15.0000 mL | Freq: Once | OROMUCOSAL | Status: AC

## 2023-07-05 MED ORDER — CHLORHEXIDINE GLUCONATE CLOTH 2 % EX PADS: 6.0000 | MEDICATED_PAD | Freq: Every day | CUTANEOUS | Status: DC

## 2023-07-05 MED ORDER — NOREPINEPHRINE 4 MG/250ML-% IV SOLN: 0.0000 ug/min | INTRAVENOUS | Status: DC

## 2023-07-05 MED ORDER — METOPROLOL TARTRATE 12.5 MG HALF TABLET
ORAL_TABLET | ORAL | Status: AC
  Administered 2023-07-05: 12.5 mg via ORAL
  Filled 2023-07-05: qty 1

## 2023-07-05 MED ORDER — OXYCODONE HCL 5 MG PO TABS
5.0000 mg | ORAL_TABLET | ORAL | Status: DC | PRN
  Administered 2023-07-05: 5 mg via ORAL
  Administered 2023-07-06 (×2): 10 mg via ORAL
  Administered 2023-07-06: 5 mg via ORAL
  Administered 2023-07-06: 10 mg via ORAL
  Administered 2023-07-07 (×2): 5 mg via ORAL
  Filled 2023-07-05: qty 1
  Filled 2023-07-05: qty 2
  Filled 2023-07-05 (×2): qty 1
  Filled 2023-07-05: qty 2
  Filled 2023-07-05: qty 1
  Filled 2023-07-05: qty 2

## 2023-07-05 MED ORDER — FENTANYL CITRATE (PF) 250 MCG/5ML IJ SOLN
INTRAMUSCULAR | Status: DC | PRN
  Administered 2023-07-05 (×2): 100 ug via INTRAVENOUS
  Administered 2023-07-05: 150 ug via INTRAVENOUS
  Administered 2023-07-05: 50 ug via INTRAVENOUS
  Administered 2023-07-05 (×2): 100 ug via INTRAVENOUS
  Administered 2023-07-05: 25 ug via INTRAVENOUS
  Administered 2023-07-05 (×2): 50 ug via INTRAVENOUS
  Administered 2023-07-05: 25 ug via INTRAVENOUS
  Administered 2023-07-05: 50 ug via INTRAVENOUS

## 2023-07-05 MED ORDER — ONDANSETRON HCL 4 MG/2ML IJ SOLN
INTRAMUSCULAR | Status: AC
  Filled 2023-07-05: qty 2

## 2023-07-05 MED ORDER — CHLORHEXIDINE GLUCONATE 0.12 % MT SOLN
OROMUCOSAL | Status: AC
  Administered 2023-07-05: 15 mL via OROMUCOSAL
  Filled 2023-07-05: qty 15

## 2023-07-05 MED ORDER — PROPOFOL 10 MG/ML IV BOLUS
INTRAVENOUS | Status: DC | PRN
  Administered 2023-07-05: 90 mg via INTRAVENOUS
  Administered 2023-07-05: 20 mg via INTRAVENOUS
  Administered 2023-07-05: 50 mg via INTRAVENOUS

## 2023-07-05 MED ORDER — SODIUM CHLORIDE 0.9 % IV SOLN: INTRAVENOUS | Status: AC

## 2023-07-05 MED ORDER — BISACODYL 10 MG RE SUPP
10.0000 mg | Freq: Every day | RECTAL | Status: DC
  Filled 2023-07-05: qty 1

## 2023-07-05 MED ORDER — CHLORHEXIDINE GLUCONATE 0.12 % MT SOLN
15.0000 mL | OROMUCOSAL | Status: AC
  Administered 2023-07-05: 15 mL via OROMUCOSAL
  Filled 2023-07-05: qty 15

## 2023-07-05 MED ORDER — HEPARIN SODIUM (PORCINE) 1000 UNIT/ML IJ SOLN
INTRAMUSCULAR | Status: AC
  Filled 2023-07-05: qty 1

## 2023-07-05 MED ORDER — VANCOMYCIN HCL IN DEXTROSE 1-5 GM/200ML-% IV SOLN
1000.0000 mg | Freq: Once | INTRAVENOUS | Status: AC
  Administered 2023-07-05: 1000 mg via INTRAVENOUS
  Filled 2023-07-05: qty 200

## 2023-07-05 MED ORDER — MIDAZOLAM HCL (PF) 5 MG/ML IJ SOLN
INTRAMUSCULAR | Status: DC | PRN
  Administered 2023-07-05: 1.5 mg via INTRAVENOUS
  Administered 2023-07-05: 1 mg via INTRAVENOUS
  Administered 2023-07-05: .5 mg via INTRAVENOUS

## 2023-07-05 MED ORDER — SODIUM CHLORIDE 0.9% FLUSH
10.0000 mL | INTRAVENOUS | Status: DC | PRN
Start: 2023-07-05 — End: 2023-07-10

## 2023-07-05 SURGICAL SUPPLY — 78 items
5 PAIRS OF YELLOW SUTURE CLAMP (MISCELLANEOUS) ×2
ADAPTER CARDIO PERF ANTE/RETRO (ADAPTER) ×2 IMPLANT
ADPR PRFSN 84XANTGRD RTRGD (ADAPTER) ×2
BAG DECANTER FOR FLEXI CONT (MISCELLANEOUS) ×2 IMPLANT
BLADE CLIPPER SURG (BLADE) ×2 IMPLANT
BLADE MICRO SHARP 3 15 DEG (BLADE) ×2 IMPLANT
BLADE STERNUM SYSTEM 6 (BLADE) ×2 IMPLANT
CANISTER SUCT 3000ML PPV (MISCELLANEOUS) ×2 IMPLANT
CANNULA MC2 2 STG 36/46 NON-V (CANNULA) IMPLANT
CANNULA NON VENT 20FR 12 (CANNULA) ×2 IMPLANT
CATH HEART VENT LEFT (CATHETERS) ×2 IMPLANT
CATH RETROPLEGIA CORONARY 14FR (CATHETERS) ×2 IMPLANT
CATH ROBINSON RED A/P 18FR (CATHETERS) ×6 IMPLANT
CATH THOR STR 32F SOFT 20 RADI (CATHETERS) ×4 IMPLANT
CATH THORACIC 28FR RT ANG (CATHETERS) ×2 IMPLANT
CLAMP SUTURE YELLOW 5 PAIRS (MISCELLANEOUS) ×2 IMPLANT
CLIP TI LARGE 6 (CLIP) ×2 IMPLANT
CLIP TI MEDIUM 24 (CLIP) IMPLANT
CLIP TI WIDE RED SMALL 24 (CLIP) IMPLANT
CNTNR URN SCR LID CUP LEK RST (MISCELLANEOUS) ×4 IMPLANT
CONT SPEC 4OZ STRL OR WHT (MISCELLANEOUS) ×4
DEVICE SUT CK QUICK LOAD MINI (Prosthesis & Implant Heart) IMPLANT
DRAPE CV SPLIT W-CLR ANES SCRN (DRAPES) ×2 IMPLANT
DRAPE INCISE IOBAN 66X45 STRL (DRAPES) ×2 IMPLANT
DRAPE PERI GROIN 82X75IN TIB (DRAPES) ×2 IMPLANT
DRSG AQUACEL AG ADV 3.5X10 (GAUZE/BANDAGES/DRESSINGS) ×2 IMPLANT
ELECT BLADE 4.0 EZ CLEAN MEGAD (MISCELLANEOUS) ×2
ELECT CAUTERY BLADE 6.4 (BLADE) ×2 IMPLANT
ELECT REM PT RETURN 9FT ADLT (ELECTROSURGICAL) ×4
ELECTRODE BLDE 4.0 EZ CLN MEGD (MISCELLANEOUS) ×2 IMPLANT
ELECTRODE REM PT RTRN 9FT ADLT (ELECTROSURGICAL) ×4 IMPLANT
FELT TEFLON 1X6 (MISCELLANEOUS) ×4 IMPLANT
GAUZE SPONGE 4X4 12PLY STRL (GAUZE/BANDAGES/DRESSINGS) ×4 IMPLANT
GOWN STRL REUS W/ TWL LRG LVL3 (GOWN DISPOSABLE) ×12 IMPLANT
GOWN STRL REUS W/TWL LRG LVL3 (GOWN DISPOSABLE) ×12
HEMOSTAT SURGICEL 2X14 (HEMOSTASIS) IMPLANT
INSERT FOGARTY 61MM (MISCELLANEOUS) IMPLANT
INSERT FOGARTY XLG (MISCELLANEOUS) ×2 IMPLANT
KIT BASIN OR (CUSTOM PROCEDURE TRAY) ×2 IMPLANT
KIT SUCTION CATH 14FR (SUCTIONS) ×2 IMPLANT
KIT SUT CK MINI COMBO 4X17 (Prosthesis & Implant Heart) IMPLANT
KIT TURNOVER KIT B (KITS) ×2 IMPLANT
LINE EXTENSION DELIVERY (MISCELLANEOUS) IMPLANT
LINE VENT (MISCELLANEOUS) IMPLANT
NS IRRIG 1000ML POUR BTL (IV SOLUTION) ×10 IMPLANT
ORGANIZER SUTURE GABBAY-FRATER (MISCELLANEOUS) ×2 IMPLANT
PACK E OPEN HEART (SUTURE) ×2 IMPLANT
PACK OPEN HEART (CUSTOM PROCEDURE TRAY) ×2 IMPLANT
PAD ARMBOARD 7.5X6 YLW CONV (MISCELLANEOUS) ×4 IMPLANT
PAD ELECT DEFIB RADIOL ZOLL (MISCELLANEOUS) ×2 IMPLANT
PENCIL BUTTON HOLSTER BLD 10FT (ELECTRODE) ×2 IMPLANT
POSITIONER HEAD DONUT 9IN (MISCELLANEOUS) ×2 IMPLANT
SET MPS 3-ND DEL (MISCELLANEOUS) IMPLANT
SUPPORT HEART JANKE-BARRON (MISCELLANEOUS) ×2 IMPLANT
SUT BONE WAX W31G (SUTURE) ×2 IMPLANT
SUT EB EXC GRN/WHT 2-0 V-5 (SUTURE) ×4 IMPLANT
SUT MNCRL AB 4-0 PS2 18 (SUTURE) ×4 IMPLANT
SUT PROLENE 4 0 RB 1 (SUTURE) ×6
SUT PROLENE 4 0 SH DA (SUTURE) ×2 IMPLANT
SUT PROLENE 4-0 RB1 .5 CRCL 36 (SUTURE) ×2 IMPLANT
SUT PROLENE 8 0 BV175 6 (SUTURE) IMPLANT
SUT STEEL SZ 6 DBL 3X14 BALL (SUTURE) ×4 IMPLANT
SUT VIC AB 0 CTX 36 (SUTURE) ×8
SUT VIC AB 0 CTX36XBRD ANTBCTR (SUTURE) ×4 IMPLANT
SUT VIC AB 2-0 CT1 27 (SUTURE) ×10
SUT VIC AB 2-0 CT1 TAPERPNT 27 (SUTURE) ×4 IMPLANT
SYSTEM SAHARA CHEST DRAIN ATS (WOUND CARE) ×2 IMPLANT
TABLE PACK (MISCELLANEOUS) IMPLANT
TAG SUTURE CLAMP YLW 5PR (MISCELLANEOUS) ×2
TAPE CLOTH SURG 4X10 WHT LF (GAUZE/BANDAGES/DRESSINGS) IMPLANT
TAPE PAPER 1X10 WHT MICROPORE (GAUZE/BANDAGES/DRESSINGS) IMPLANT
TOWEL GREEN STERILE (TOWEL DISPOSABLE) ×2 IMPLANT
TOWEL GREEN STERILE FF (TOWEL DISPOSABLE) ×2 IMPLANT
TRAY FOLEY SLVR 16FR TEMP STAT (SET/KITS/TRAYS/PACK) ×2 IMPLANT
TUBE CONNECTING 12X1/4 (SUCTIONS) IMPLANT
VALVE AORTIC SZ21 INSP/RESIL (Valve) IMPLANT
VENT LEFT HEART 12002 (CATHETERS) ×2
WATER STERILE IRR 1000ML POUR (IV SOLUTION) ×4 IMPLANT

## 2023-07-05 NOTE — Op Note (Signed)
CARDIOVASCULAR SURGERY OPERATIVE NOTE  07/05/2023 Bruce Nunez [id number]  Surgeon:  Ashley Akin, MD  First Assistant: Aloha Gell [facility]                               An experienced assistant was required given the complexity of this surgery and the standard of surgical care. The assistant was needed for exposure, dissection, suctioning, retraction of delicate tissues and sutures, instrument exchange and for overall help during this procedure.     Preoperative Diagnosis:  Severe Aortic Regurgitation                                               Coronary artery disease  Postoperative Diagnosis:  Same   Procedure: Aortic valve replacement with a 21mm Inspiris Pericardial valve Coronary artery bypass x 1 with the LIMA to the LAD  Anesthesia:  General Endotracheal   Clinical History/Surgical Indication:  Pt is an 82 yo male with NYHA class 2-3 symptoms of severe AI with normal LV function and moderate CAD. I agree that AVR is indicated and would place a LIMA to his LAD at that time   Findings: The aortic valve had severe regurgitation. The LAD was a 2 mm vessel with mild disease. The LIMA was excellent for bypass. The LV had normal function. At conclusion of procedure the LV was normal with NSR and the valve was well seated and with a mean gradient of  Preparation:  The patient was seen in the preoperative holding area and the correct patient, correct operation were confirmed with the patient after reviewing the medical record and catheterization. The consent was signed by me. Preoperative antibiotics were given. A pulmonary arterial line and radial arterial line were placed by the anesthesia team. The patient was taken back to the operating room and positioned supine on the operating room table. After being placed under general endotracheal anesthesia by the anesthesia team a foley catheter was placed. The neck, chest, abdomen, and both legs were prepped with betadine soap and  solution and draped in the usual sterile manner. A surgical time-out was taken and the correct patient and operative procedure were confirmed with the nursing and anesthesia staff.  Operation: A median sternotomy incision was then created and sternal valve the sternal saw.  The left internal mammary artery was then harvested and packed in papaverine soaked sponge.  Heparin was delivered. Pericardial well was developed and the aorta was cannulated with a 20 [place] Starns aortic cannula and a two-stage cannula was placed in the right atrium for venous return.  Antegrade and retrograde cardioplegia catheters were placed in the ascending aorta and coronary sinus effectively. With adequate confirmation of anticoagulation cardiopulmonary bypass was initiated and a left ventricular sump placed across right superior pulmonary vein.  Aortic cross-clamp was placed and 1200 cc of [place] blood cardioplegia delivered retrograde.  A reanimation dose was delivered prior to cross-clamp removal. The left internal mammary artery was then anastomosed to the LAD in an end-to-side fashion utilizing 8-0 Prolene sutures.  It was flushed here and found to be hemostatic. Aortotomy was then created and the aortic valve was identified photographed resected and the annulus decalcified.  Utilizing fifteen 2-0 [place] pledgeted sutures with the pledgets on the ventricular side the 21 mm Inspiris valve was then  secured utilizing the core knot system.  The aortotomy was then closed with a running 4 oh pledgeted Prolene suture and the patient in headdown position aortic cross-clamp was removed. Ventricular and atrial pacing wires were placed on the patient after de-airing was weaned from cardiopulmonary bypass on inotropic support.  With adequate hemodynamics protamine was delivered and the patient was decannulated and sites oversewn were necessary.  Chest tubes were were brought out the inferior stab was and secured and with adequate  hemostasis the sternum was reapproximated with interrupted stainless steel wire.  At this time there appeared to be some excessive chest tube drainage and the wires were removed and there was at the heel of the anastomosis of the LAD LIMA anastomosis a bleeding area which was controlled with an 8-0 Prolene suture.  Following this the sternum was again reapproximated with interrupted stainless steel wires and the presternal subcutaneous tissue and skin were closed multiple layers absorbable suture sterile dressings were applied.

## 2023-07-05 NOTE — Anesthesia Procedure Notes (Signed)
Central Venous Catheter Insertion Performed by: Atilano Median, DO, anesthesiologist Start/End11/03/2023 6:45 AM, 07/05/2023 7:00 AM Patient location: Pre-op. Preanesthetic checklist: patient identified, IV checked, site marked, risks and benefits discussed, surgical consent, monitors and equipment checked, pre-op evaluation, timeout performed and anesthesia consent Position: Trendelenburg Lidocaine 1% used for infiltration and patient sedated Hand hygiene performed  and maximum sterile barriers used  Catheter size: 8.5 Fr Central line was placed.Sheath introducer Procedure performed using ultrasound guided technique. Ultrasound Notes:anatomy identified, needle tip was noted to be adjacent to the nerve/plexus identified, no ultrasound evidence of intravascular and/or intraneural injection and image(s) printed for medical record Attempts: 1 Following insertion, line sutured, dressing applied and Biopatch. Post procedure assessment: blood return through all ports, free fluid flow and no air  Patient tolerated the procedure well with no immediate complications.

## 2023-07-05 NOTE — Anesthesia Procedure Notes (Signed)
Procedure Name: Intubation Date/Time: 07/05/2023 7:49 AM  Performed by: Little Ishikawa, CRNAPre-anesthesia Checklist: Patient identified, Emergency Drugs available, Suction available, Timeout performed and Patient being monitored Patient Re-evaluated:Patient Re-evaluated prior to induction Oxygen Delivery Method: Circle system utilized Preoxygenation: Pre-oxygenation with 100% oxygen Induction Type: IV induction Ventilation: Mask ventilation without difficulty Laryngoscope Size: Mac and 4 Grade View: Grade I Tube type: Oral Tube size: 8.0 mm Airway Equipment and Method: Stylet Placement Confirmation: ETT inserted through vocal cords under direct vision, positive ETCO2, CO2 detector and breath sounds checked- equal and bilateral Secured at: 22 cm Tube secured with: Tape Dental Injury: Teeth and Oropharynx as per pre-operative assessment

## 2023-07-05 NOTE — Brief Op Note (Signed)
07/05/2023  2:03 PM  PATIENT:  Bruce Nunez  82 y.o. male  PRE-OPERATIVE DIAGNOSIS:  SEVERE AORTIC INSUFFICIENCY AND CORONARY ARTERY DISEASE  POST-OPERATIVE DIAGNOSIS:  SEVERE AORTIC INSUFFICIENCY AND CORONARY ARTERY DISEASE  PROCEDURE:   AORTIC VALVE REPLACEMENT WITH INSPIRIS AORTIC VALVE  CORONARY ARTERY BYPASS GRAFTING, USING LEFT INTERNAL MAMMARY ARTERY  TRANSESOPHAGEAL ECHOCARDIOGRAM  -LIMA to LAD  SURGEON:  Surgeons and Role:    [name], MD - Primary  PHYSICIAN ASSISTANT: Aloha Gell PA-C  ASSISTANTS: none   ANESTHESIA:   general  EBL:   BLOOD ADMINISTERED:none  DRAINS:  Mediastinal drains    LOCAL MEDICATIONS USED:  NONE  SPECIMEN:  No Specimen  DISPOSITION OF SPECIMEN:  N/A  COUNTS:  YES  DICTATION: .Dragon Dictation  PLAN OF CARE: Admit to inpatient   PATIENT DISPOSITION:  ICU - intubated and hemodynamically stable.   Delay start of Pharmacological VTE agent (>24hrs) due to surgical blood loss or risk of bleeding: yes

## 2023-07-05 NOTE — Progress Notes (Signed)
  Echocardiogram Echocardiogram Transesophageal has been performed.  Janalyn Harder 07/05/2023, 8:38 AM

## 2023-07-05 NOTE — Plan of Care (Signed)
  Problem: Clinical Measurements: Goal: Ability to maintain clinical measurements within normal limits will improve Outcome: Progressing Goal: Will remain free from infection Outcome: Progressing Goal: Respiratory complications will improve Outcome: Progressing Goal: Cardiovascular complication will be avoided Outcome: Progressing   Problem: Elimination: Goal: Will not experience complications related to bowel motility Outcome: Progressing   Problem: Pain Management: Goal: General experience of comfort will improve Outcome: Progressing   Problem: Activity: Goal: Risk for activity intolerance will decrease Outcome: Progressing   Problem: Cardiac: Goal: Will achieve and/or maintain hemodynamic stability Outcome: Progressing   Problem: Clinical Measurements: Goal: Postoperative complications will be avoided or minimized Outcome: Progressing   Problem: Respiratory: Goal: Respiratory status will improve Outcome: Progressing

## 2023-07-05 NOTE — Anesthesia Procedure Notes (Signed)
Arterial Line Insertion Start/End11/03/2023 6:55 AM, 07/05/2023 7:00 AM Performed by: Little Ishikawa, CRNA, CRNA  Patient location: Pre-op. Preanesthetic checklist: patient identified, IV checked, site marked, risks and benefits discussed, surgical consent, monitors and equipment checked, pre-op evaluation, timeout performed and anesthesia consent Lidocaine 1% used for infiltration Left, radial was placed Catheter size: 20 G Hand hygiene performed  and maximum sterile barriers used  Allen's test indicative of satisfactory collateral circulation Attempts: 1 Procedure performed without using ultrasound guided technique. Following insertion, dressing applied and Biopatch. Post procedure assessment: normal  Patient tolerated the procedure well with no immediate complications.

## 2023-07-05 NOTE — Interval H&P Note (Signed)
History and Physical Interval Note:  07/05/2023 6:27 AM  Bruce Nunez  has presented today for surgery, with the diagnosis of severe AI, CAD.  The various methods of treatment have been discussed with the patient and family. After consideration of risks, benefits and other options for treatment, the patient has consented to  Procedure(s): AORTIC VALVE REPLACEMENT (AVR) (N/A) CORONARY ARTERY BYPASS GRAFTING (CABG) (N/A) TRANSESOPHAGEAL ECHOCARDIOGRAM (N/A) as a surgical intervention.  The patient's history has been reviewed, patient examined, no change in status, stable for surgery.  I have reviewed the patient's chart and labs.  Questions were answered to the patient's satisfaction.     [name]

## 2023-07-05 NOTE — Progress Notes (Signed)
Extubation Procedure Note  Patient Details:   Name: Bruce Nunez DOB: 1940/09/28 MRN: [id number]   Airway Documentation:      Pt extubated per protocol, VC 1.0L NIF -17, -18. MD aware and ordered to proceed with extubation. Pt tol well. Pt able to vocalize with raspy voice, no stridor. NAD. Positive cuff leak  Evaluation  O2 sats: stable throughout 95% on RA Complications: No apparent complications Patient did tolerate procedure well. Bilateral Breath Sounds: Clear, Diminished     Nelda Marseille 07/05/2023, 8:51 PM

## 2023-07-05 NOTE — Anesthesia Procedure Notes (Signed)
Central Venous Catheter Insertion Performed by: Atilano Median, DO, anesthesiologist Start/End11/03/2023 7:01 AM, 07/05/2023 7:03 AM Patient location: Pre-op. Preanesthetic checklist: patient identified, IV checked, site marked, risks and benefits discussed, surgical consent, monitors and equipment checked, pre-op evaluation, timeout performed and anesthesia consent Position: supine Hand hygiene performed  and maximum sterile barriers used  PA cath was placed.Swan type:thermodilution PA Cath depth:50 Procedure performed without using ultrasound guided technique. Attempts: 1 Patient tolerated the procedure well with no immediate complications.

## 2023-07-05 NOTE — Consult Note (Signed)
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 He was not a good candidate for TAVR, and was referred for surgical AVR and CABG eval on 10/10.    He presented 11/8 for planned AVR and CABG x1 (LIMA LAD.   Admitted to ICU post op as per plan. PCCM consulted in this setting  Xclamp 76 min  Total pump 96 min  EBL 1100  Product about 600 cellsaver   Pertinent  Medical History  AI CAD HTN HLD CVA   Significant Hospital Events: Including procedures, antibiotic start and stop dates in addition to other pertinent events   AVR, CABG x1 (LIMA- LAD)   Interim History / Subjective:  POD0   Objective   Blood pressure (!) 161/54, pulse 71, temperature 98 F (36.7 C), temperature source Oral, resp. rate 18, height 5\' 7"  (1.702 m), weight 66.4 kg, SpO2 94%. PAP: (12-38)/(-9-20) 12/-9      Intake/Output Summary (Last 24 hours) at 07/05/2023 1355 Last data filed at 07/05/2023 1234 Gross per 24 hour  Intake 2450 ml  Output 1350 ml  Net 1100 ml   Filed Weights   07/05/23 0543  Weight: 66.4 kg    Examination: General: critically ill elderly M intubatd sedated  HENT: ETT secure eyelids taped  Lungs: Even unlabored on MV support  Cardiovascular: chest tube with bloody output, pacing wires in place cap refill < 3 sec  Abdomen: soft ndnt  Extremities: no acute joint deformity  Neuro: sedated does not follow commands.  GU: foley yellow urine   Resolved Hospital Problem list     Assessment & Plan:   AI S/p AVR CAD s/p CABG x 1  Endotracheally intubated Hypotension, post bypass   expected ABLA  HTN HLD Hx cva  Hx hypothyroidism  P -lines drains wires per  CVTS -CXR, ABG -- hopefully rapid wean -post op labs + bmp  -passive rewarm -complete txa, abx ppx -wean pressors as able -- on  5 NE right now  -albumin per protocol  -insulin gtt  -antiplt, statin   Best Practice (right click and "Reselect all SmartList Selections" daily)   Diet/type: NPO DVT prophylaxis: not indicated-- completing txa  GI prophylaxis: PPI Lines: Central line, Arterial Line, and yes and it is still needed Foley:  Yes, and it is still needed Code Status:  full code Last date of multidisciplinary goals of care discussion [--]  Labs   CBC: Recent Labs  Lab 07/03/23 1500 07/05/23 0755 07/05/23 1020 07/05/23 1050 07/05/23 1107 07/05/23 1140 07/05/23 1336  WBC 8.7  --   --   --   --   --   --   HGB 14.9   < > 8.8* 7.9* 7.8* 7.8* 8.2*  HCT 45.4   < > 26.0* 24.0* 23.0* 23.0* 24.0*  MCV 86.0  --   --   --   --   --   --   PLT 177  --   --  110*  --   --   --    < > = values in this interval not displayed.    Basic Metabolic Panel: Recent Labs  Lab 07/03/23 1500 07/05/23 0755 07/05/23 0930 07/05/23 0953 07/05/23 0956 07/05/23 1020 07/05/23 1107 07/05/23 1140 07/05/23 1336  NA 140 139 138   < > 140 134* 136 138 139  K 3.6 3.8 4.1   < > 4.1 4.5 4.4 4.2 4.1  CL 104 100 101  --   --  99 99 102  --   CO2 29  --   --   --   --   --   --   --   --   GLUCOSE 81 122* 165*  --   --  155* 139* 134*  --   BUN 11 14 15   --   --  13 12 12   --   CREATININE 0.81 1.00 0.90  --   --  0.80 0.80 0.80  --   CALCIUM 9.4  --   --   --   --   --   --   --   --    < > = values in this interval not displayed.   GFR: Estimated Creatinine Clearance: 65.4 mL/min (by C-G formula based on SCr of 0.8 mg/dL). Recent Labs  Lab 07/03/23 1500  WBC 8.7    Liver Function Tests: Recent Labs  Lab 07/03/23 1500  AST 22  ALT 15  ALKPHOS 55  BILITOT 1.6*  PROT 6.3*  ALBUMIN 3.8   No results for input(s): "LIPASE", "AMYLASE" in the last 168 hours. No results for  input(s): "AMMONIA" in the last 168 hours.  ABG    Component Value Date/Time   PHART 7.423 07/05/2023 1336   PCO2ART 30.4 (L) 07/05/2023 1336   PO2ART 142 (H) 07/05/2023 1336   HCO3 20.0 07/05/2023 1336   TCO2 21 (L) 07/05/2023 1336   ACIDBASEDEF 4.0 (H) 07/05/2023 1336   O2SAT 99 07/05/2023 1336     Coagulation Profile: Recent Labs  Lab 07/03/23 1500  INR 1.0    Cardiac Enzymes: No results for input(s): "CKTOTAL", "CKMB", "CKMBINDEX", "TROPONINI" in the last 168 hours.  HbA1C: Hgb A1c MFr Bld  Date/Time Value Ref Range Status  07/03/2023 02:38 PM 6.1 (H) 4.8 - 5.6 % Final    Comment:    (NOTE) Pre diabetes:          5.7%-6.4%  Diabetes:              >6.4%  Glycemic control for   <7.0% adults with diabetes   03/21/2023 10:59 AM 6.6 (H) 4.8 - 5.6 % Final    Comment:             Prediabetes: 5.7 - 6.4          Diabetes: >6.4          Glycemic control for adults with diabetes: <7.0     CBG: Recent Labs  Lab 07/03/23 1442 07/05/23 0548 07/05/23 1338  GLUCAP 76 117* 116*    Review of Systems:   Unable to obtain intubated and sedated   Past Medical History:  He,  has a past medical history of Anemia, Aortic regurgitation (03/12/2017), Aortic stenosis (04/29/2019), Asthma, Complication of anesthesia, Diabetes mellitus without complication (HCC), Essential hypertension (03/12/2017), Frequency of urination, GERD (gastroesophageal reflux disease), Heart murmur, Hemorrhoids, History of kidney stones, colon cancer, stage I, nonmelanoma skin cancer, peptic ulcer, transfusion, Hyperlipidemia, Hypertension, Inguinal hernia, PONV (postoperative nausea and vomiting), Prostate cancer (HCC) (08/15/2012), Right inguinal hernia (05/08/2016), S/P right inguinal hernia repair Sept 2015 (05/17/2014), and Stroke (HCC) (03/28/2022).   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   RADIOACTIVE SEED IMPLANT N/A 11/24/2012   Procedure: RADIOACTIVE SEED IMPLANT;  Surgeon: Valetta Fuller, MD;  Location: [facility];  Service: Urology;  Laterality: N/A;   71seeds implanted  no seeds found in bladder   THROAT SURGERY  1940's   as child (age 78)growth that created fissure/corrective surgery   TONSILLECTOMY     age 12   XI ROBOTIC ASSISTED INGUINAL HERNIA REPAIR WITH MESH Right 05/08/2016   Procedure: XI ROBOTIC ASSISTED REPAIR OF RECURRENT RIGHT INGUINAL HERNIA;  Surgeon: Luretha Murphy, MD;  Location: [facility];  Service: General;  Laterality: Right;  With MESH     Social History:   reports that he has never smoked. He has never been exposed to tobacco smoke. He has never used smokeless tobacco. He reports that he does not drink alcohol and does not use drugs.   Family History:  His family history includes Brain cancer in an other family member; Coronary artery disease in an other family member; Heart attack in an other family member; Prostate cancer in an other family member.   Allergies Allergies  Allergen Reactions   Penicillins Anaphylaxis, Shortness Of Breath, Itching and Swelling    Throat swells up but no intubation per wife   Iodinated Contrast Media Hives and Rash   Aspirin Other (See Comments)    GI bleeding-avoids   Metformin And Related Diarrhea   Monodox [Doxycycline Hyclate] Nausea Only    "made me feel sick"   Sulfa Antibiotics Itching and Swelling   Tramadol Nausea And Vomiting      Home Medications  Prior to Admission medications   Medication Sig Start Date End Date Taking? Authorizing Provider  albuterol (PROVENTIL HFA;VENTOLIN HFA) 108 (90 BASE) MCG/ACT inhaler Inhale 2 puffs into the lungs every 6 (six) hours as needed for shortness of breath.   Yes [provider]  bisoprolol-hydrochlorothiazide (ZIAC) 10-6.25 MG per tablet Take 1 tablet by mouth every morning.    Yes [provider]  budesonide-formoterol (SYMBICORT) 160-4.5 MCG/ACT inhaler Inhale 2 puffs into the lungs 2 (two) times daily. 03/08/23  Yes [provider]  clopidogrel (PLAVIX) 75 MG tablet Take 1 tablet (75 mg total) by mouth daily. 03/21/23 03/20/24 Yes Micki Riley, MD  fexofenadine (ALLEGRA) 180 MG tablet Take 180 mg by mouth daily.   Yes [provider]  fluticasone (FLONASE) 50 MCG/ACT nasal spray Place 1 spray into both nostrils daily.    Yes [provider]  glimepiride (AMARYL) 2 MG tablet Take 2 mg by mouth in the morning. 01/11/23  Yes [provider]  isosorbide mononitrate (IMDUR) 30 MG 24 hr tablet Take 1 tablet (30 mg total) by mouth daily. 04/22/23  Yes Baldo Daub, MD  levothyroxine (SYNTHROID) 75 MCG tablet Take 75 mcg by mouth daily before breakfast.   Yes [provider]  Multiple Vitamin (MULTIVITAMIN WITH MINERALS) TABS tablet Take 1 tablet by mouth daily.   Yes [provider]  nitroGLYCERIN (NITROSTAT) 0.4 MG SL tablet DISSOLVE 1 TABLET UNDER THE TONGUE EVERY 5 MINUTES AS NEEDED FOR CHEST PAIN. DO NOT EXCEED A TOTAL OF 3 DOSES IN 15 MINUTES. 01/03/23  Yes Baldo Daub, MD  omeprazole (PRILOSEC) 20 MG capsule Take 20 mg by mouth in the morning.   Yes [provider]  rosuvastatin (CRESTOR) 20 MG tablet Take 1 tablet (20 mg total) by mouth daily. 01/22/23  Yes Baldo Daub, MD  solifenacin (VESICARE) 10 MG tablet Take 10 mg by mouth in the morning. 01/17/21  Yes [provider]  Tamsulosin  HCl (FLOMAX) 0.4 MG CAPS Take 0.4 mg by mouth 2 (two) times daily.    Yes [provider]  cyanocobalamin (,VITAMIN B-12,) 1000 MCG/ML injection Inject 1 mL into the skin every 30 (thirty) days. 02/04/19   [provider]  dicyclomine (BENTYL) 10 MG capsule Take 10 mg by mouth 2 (two) times daily as needed for cramping (abdominal pain/diarrhea.). 02/13/22   [provider]  furosemide (LASIX) 20 MG tablet Take 10-20 mg by mouth daily as needed for fluid or edema. 12/31/22   [provider]     Critical care time:     CRITICAL CARE Performed by: Lanier Clam   Total critical care time: 36 minutes  Critical care time was exclusive of separately billable procedures and treating other patients.  Critical care was necessary to treat or prevent imminent or life-threatening deterioration.  Critical care was time spent personally by me on the following activities: development of treatment plan with patient and/or surrogate as well as nursing, discussions with consultants, evaluation of patient's response to treatment, examination of patient, obtaining history from patient or surrogate, ordering and performing treatments and interventions, ordering and review of laboratory studies, ordering and review of radiographic studies, pulse oximetry and re-evaluation of patient's condition.  Tessie Fass MSN, AGACNP-BC [facility] Pulmonary/Critical Care Medicine Amion for pager 07/05/2023, 1:55 PM

## 2023-07-05 NOTE — Discharge Instructions (Addendum)
Discharge Instructions:  1. You may shower, please wash incisions daily with soap and water and keep dry.  If you wish to cover wounds with dressing you may do so but please keep clean and change daily.  No tub baths or swimming until incisions have completely healed.  If your incisions become red or develop any drainage please call our office at [phone]  2. No Driving until cleared by Dr. Karolee Ohs office and you are no longer using narcotic pain medications  3. Monitor your weight daily.. Please use the same scale and weigh at same time... If you gain 5-10 lbs in 48 hours with associated lower extremity swelling, please contact our office at [phone]  4. Fever of 101.5 for at least 24 hours with no source, please contact our office at [phone]  5. Activity- up as tolerated, please walk at least 3 times per day.  Avoid strenuous activity, no lifting, pushing, or pulling with your arms over 8-10 lbs for a minimum of 6 weeks  6. If any questions or concerns arise, please do not hesitate to contact our office at [phone]     PLEASE NOTE BOTH SETS OF CARE INSTRUCTIONS FOLLOW WHICHEVER RESTRICTION IS LONGEST (BATHING, DRIVING, EXERCISE, ETC)  After Your Pacemaker   You have a Abbott Pacemaker  ACTIVITY Do not lift your arm above shoulder height for 1 week after your procedure. After 7 days, you may progress as below.  You should remove your sling 24 hours after your procedure, unless otherwise instructed by your provider.     Thursday July 25, 2023  Friday July 26, 2023 Saturday July 27, 2023 Sunday July 28, 2023   Do not lift, push, pull, or carry anything over 10 pounds with the affected arm until 6 weeks (Thursday August 29, 2023 ) after your procedure.   You may drive AFTER your wound check, unless you have been told otherwise by your provider.   Ask your healthcare provider when you can go back to work   INCISION/Dressing If you are on a blood  thinner such as Coumadin, Xarelto, Eliquis, Plavix, or Pradaxa please confirm with your provider when this should be resumed. Resume Eliquis 07/21/23  Monitor your Pacemaker site for redness, swelling, and drainage. Call the device clinic at [phone] if you experience these symptoms or fever/chills.  If your incision is sealed with Steri-strips or staples, you may shower 7 days after your procedure or when told by your provider. Do not remove the steri-strips or let the shower hit directly on your site. You may wash around your site with soap and water.    If you were discharged in a sling, please do not wear this during the day more than 48 hours after your surgery unless otherwise instructed. This may increase the risk of stiffness and soreness in your shoulder.   Avoid lotions, ointments, or perfumes over your incision until it is well-healed.  You may use a hot tub or a pool AFTER your wound check appointment if the incision is completely closed.  Pacemaker Alerts:  Some alerts are vibratory and others beep. These are NOT emergencies. Please call our office to let us know. If this occurs at night or on weekends, it can wait until the next business day. Send a remote transmission.  If your device is capable of reading fluid status (for heart failure), you will be offered monthly monitoring to review this with you.   DEVICE MANAGEMENT Remote monitoring is used to monitor your pacemaker  from home. This monitoring is scheduled every 91 days by our office. It allows [place] to keep an eye on the functioning of your device to ensure it is working properly. You will routinely see your Electrophysiologist annually (more often if necessary).   You should receive your ID card for your new device in 4-8 weeks. Keep this card with you at all times once received. Consider wearing a medical alert bracelet or necklace.  Your Pacemaker may be MRI compatible. This will be discussed at your next office  visit/wound check.  You should avoid contact with strong electric or magnetic fields.   Do not use amateur (ham) radio equipment or electric (arc) welding torches. MP3 player headphones with magnets should not be used. Some devices are safe to use if held at least 12 inches (30 cm) from your Pacemaker. These include power tools, lawn mowers, and speakers. If you are unsure if something is safe to use, ask your health care provider.  When using your cell phone, hold it to the ear that is on the opposite side from the Pacemaker. Do not leave your cell phone in a pocket over the Pacemaker.  You may safely use electric blankets, heating pads, computers, and microwave ovens.  Call the office right away if: You have chest pain. You feel more short of breath than you have felt before. You feel more light-headed than you have felt before. Your incision starts to open up.  This information is not intended to replace advice given to you by your health care provider. Make sure you discuss any questions you have with your health care provider.

## 2023-07-05 NOTE — Transfer of Care (Signed)
Immediate Anesthesia Transfer of Care Note  Patient: Bruce Nunez  Procedure(s) Performed: AORTIC VALVE REPLACEMENT WITH INSPIRIS AORTIC VALVE (Chest) CORONARY ARTERY BYPASS GRAFTING, USING LEFT INTERNAL MAMMARY ARTERY (Chest) TRANSESOPHAGEAL ECHOCARDIOGRAM  Patient Location: SICU  Anesthesia Type:General  Level of Consciousness: Patient remains intubated per anesthesia plan  Airway & Oxygen Therapy: Patient remains intubated per anesthesia plan and Patient placed on Ventilator (see vital sign flow sheet for setting)  Post-op Assessment: Report given to RN and Post -op Vital signs reviewed and stable  Post vital signs: Reviewed and stable  Last Vitals:  Vitals Value Taken Time  BP    Temp 36.1 C 07/05/23 1334  Pulse 75 07/05/23 1333  Resp    SpO2 100 % 07/05/23 1333  Vitals shown include unfiled device data.  Last Pain:  Vitals:   07/05/23 0610  TempSrc:   PainSc: 0-No pain         Complications: No notable events documented.

## 2023-07-05 NOTE — Hospital Course (Addendum)
History of Present Illness:     Pt is a very pleasant 82 yo male who was told had a leaky aortic valve over 5 years ago and has been followed by Dr Dulce Sellar. Pt has experienced a dramatic decline in functional ability the past 6 months. He was doing very active chores around large home acreage but has declined significantly to where he can't do very much at all. He becomes winded and also describes some lower chest discomfort. All stop when he rests. Pt underwent recent ehco with normal LV function but with moderate to severe AI and mild AS. Pt underwent cath with moderate CAD and LV gram with severe AR. Pt also reports having seen neurology due to concerns of strokes with an inability to control his lower right leg and was negative on CT scans but with evidence of small CVA on MRI. Pt was started on plavix. Pt is very anxious to proceed with AVR and on consideration for TAVR was felt not a candidate due to the lack of calcium to hold valve.  Dr. Leafy Ro reviewed the patient's diagnostic studies and determined he would benefit from surgical intervention. He reviewed the treatment options as well as the risks and benefits of surgery. Bruce Nunez was agreeable to proceed with surgery.  Hospital Course: Bruce Nunez presented to [facility] and was brought to the operating room on 07/05/23. He underwent CABG x 1 utilizing LIMA to LAD and AVR utilizing a 21mm Inspiris Aortic Valve. He tolerated the procedure well and was transferred to the SICU in stable condition.  Vital signs and hemodynamics remained stable.  He was weaned from the mechanical ventilator and extubated by 9 PM on the day of surgery.   By the following morning, he had developed significant agitation and delirium.  He was treated with both Precedex and haloperidol.  Symptoms improved so that he was more cooperative by midmorning on postop day 2. Vital signs and hemodynamics are stable.  The epicardial pacing wires were removed on postop day 2.   Diuresis was begun with IV Lasix. Chest tubes were removed without complication. He had expected postoperative thrombocytopenia that improved with time. He was felt stable for transfer to the progressive unit. He was restarted on Plavix for his recent stroke. The patient continued to have intermittent confusion requiring seroquel and Haldol PRN as well as a sitter, narcotics were held. He was hypertensive and tachycardic, Lopressor was titrated to 50mg  TID as discussed with Dr. Leafy Ro. PT/OT evaluations recommended SNF at discharge. He remained confused, as discussed with neurology MRI brain was ordered and showed punctate foci of acute infarction in the left temporal occipital junction and right parietal subcortical white matter as well as an acute cortical infarct along the right superior frontal gyrus and additional foci of DWI hypersensitivity along the left pre and postcentral gyri likely reflecting subacute infarcts. No acute hemorrhage was noted. Neurology was consulted and recommended MRA which was unremarkable. Neurology continued to follow closely. SLP was consulted for evaluation and treatment. Lopressor was titrated for hypertension. He developed postoperative rate controlled atrial fibrillation. He is allergic to iodine contrast dye so he was started on Diltiazem instead of Amiodarone. Plavix was transitioned to Eliquis for further stroke prevention.  The patient's delirium improved.  He remained in NSR.  He continued working with PT.  His surgical incisions are free from evidence of infection. 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 They recommended discontinuing all nodal agents including Metoprolol and Diltiazem, his conduction improved but EP recommended pacemaker placement which was done on 11/20 without complication. Per EP recommendations he was restarted on Toprol XL 100mg  daily and Eliquis on 11/24. His  ambulation was progressing and his incisions were healing well without sign of infection. He was felt stable for discharge to SNF on 11/21 but was unable to get SNF authorization and a bed until 11/23.

## 2023-07-06 ENCOUNTER — Inpatient Hospital Stay (HOSPITAL_COMMUNITY): Payer: Medicare Other

## 2023-07-06 ENCOUNTER — Encounter (HOSPITAL_COMMUNITY): Payer: Self-pay | Admitting: Thoracic Surgery (Cardiothoracic Vascular Surgery)

## 2023-07-06 DIAGNOSIS — Z952 Presence of prosthetic heart valve: Secondary | ICD-10-CM | POA: Diagnosis not present

## 2023-07-06 LAB — TYPE AND SCREEN
ABO/RH(D): O POS
Antibody Screen: NEGATIVE
Unit division: 0

## 2023-07-06 LAB — CBC
HCT: 27.5 % — ABNORMAL LOW (ref 39.0–52.0)
HCT: 32.3 % — ABNORMAL LOW (ref 39.0–52.0)
Hemoglobin: 9.2 g/dL — ABNORMAL LOW (ref 13.0–17.0)
Hemoglobin: 10.7 g/dL — ABNORMAL LOW (ref 13.0–17.0)
MCH: 27.7 pg (ref 26.0–34.0)
MCH: 27.8 pg (ref 26.0–34.0)
MCHC: 33.5 g/dL (ref 30.0–36.0)
MCHC: 33.1 g/dL (ref 30.0–36.0)
MCV: 82.8 fL (ref 80.0–100.0)
MCV: 83.9 fL (ref 80.0–100.0)
Platelets: 93 10*3/uL — ABNORMAL LOW (ref 150–400)
Platelets: 104 10*3/uL — ABNORMAL LOW (ref 150–400)
RBC: 3.32 MIL/uL — ABNORMAL LOW (ref 4.22–5.81)
RBC: 3.85 MIL/uL — ABNORMAL LOW (ref 4.22–5.81)
RDW: 14.5 % (ref 11.5–15.5)
RDW: 14.6 % (ref 11.5–15.5)
WBC: 9.9 10*3/uL (ref 4.0–10.5)
WBC: 13.2 10*3/uL — ABNORMAL HIGH (ref 4.0–10.5)
nRBC: 0 % (ref 0.0–0.2)
nRBC: 0 % (ref 0.0–0.2)

## 2023-07-06 LAB — BASIC METABOLIC PANEL
Anion gap: 5 (ref 5–15)
Anion gap: 9 (ref 5–15)
BUN: 13 mg/dL (ref 8–23)
BUN: 17 mg/dL (ref 8–23)
CO2: 22 mmol/L (ref 22–32)
CO2: 18 mmol/L — ABNORMAL LOW (ref 22–32)
Calcium: 8.3 mg/dL — ABNORMAL LOW (ref 8.9–10.3)
Calcium: 8.2 mg/dL — ABNORMAL LOW (ref 8.9–10.3)
Chloride: 107 mmol/L (ref 98–111)
Chloride: 103 mmol/L (ref 98–111)
Creatinine, Ser: 1.21 mg/dL (ref 0.61–1.24)
Creatinine, Ser: 1.33 mg/dL — ABNORMAL HIGH (ref 0.61–1.24)
GFR, Estimated: 59 mL/min — ABNORMAL LOW (ref >60)
GFR, Estimated: 53 mL/min — ABNORMAL LOW (ref >60)
Glucose, Bld: 143 mg/dL — ABNORMAL HIGH (ref 70–99)
Glucose, Bld: 108 mg/dL — ABNORMAL HIGH (ref 70–99)
Potassium: 3.8 mmol/L (ref 3.5–5.1)
Potassium: 4.2 mmol/L (ref 3.5–5.1)
Sodium: 134 mmol/L — ABNORMAL LOW (ref 135–145)
Sodium: 130 mmol/L — ABNORMAL LOW (ref 135–145)

## 2023-07-06 LAB — ECHO INTRAOPERATIVE TEE
AR max vel: 1.34 cm2
AV Area VTI: 1.4 cm2
AV Area mean vel: 1.36 cm2
AV Mean grad: 17 mm[Hg]
AV Peak grad: 31.1 mm[Hg]
AV Vena cont: 0.7 cm
Ao pk vel: 2.79 m/s
Area-P 1/2: 3.63 cm2
Height: 67 in
S' Lateral: 3 cm
Weight: 2340.79 [oz_av]

## 2023-07-06 LAB — GLUCOSE, CAPILLARY
Glucose-Capillary: 111 mg/dL — ABNORMAL HIGH (ref 70–99)
Glucose-Capillary: 122 mg/dL — ABNORMAL HIGH (ref 70–99)
Glucose-Capillary: 134 mg/dL — ABNORMAL HIGH (ref 70–99)
Glucose-Capillary: 137 mg/dL — ABNORMAL HIGH (ref 70–99)
Glucose-Capillary: 139 mg/dL — ABNORMAL HIGH (ref 70–99)
Glucose-Capillary: 140 mg/dL — ABNORMAL HIGH (ref 70–99)
Glucose-Capillary: 142 mg/dL — ABNORMAL HIGH (ref 70–99)
Glucose-Capillary: 145 mg/dL — ABNORMAL HIGH (ref 70–99)
Glucose-Capillary: 148 mg/dL — ABNORMAL HIGH (ref 70–99)
Glucose-Capillary: 149 mg/dL — ABNORMAL HIGH (ref 70–99)
Glucose-Capillary: 149 mg/dL — ABNORMAL HIGH (ref 70–99)
Glucose-Capillary: 163 mg/dL — ABNORMAL HIGH (ref 70–99)
Glucose-Capillary: 164 mg/dL — ABNORMAL HIGH (ref 70–99)

## 2023-07-06 LAB — BPAM RBC
Blood Product Expiration Date: 202411262359
ISSUE DATE / TIME: 202411081407
Unit Type and Rh: 5100

## 2023-07-06 LAB — MAGNESIUM
Magnesium: 3 mg/dL — ABNORMAL HIGH (ref 1.7–2.4)
Magnesium: 2.6 mg/dL — ABNORMAL HIGH (ref 1.7–2.4)

## 2023-07-06 LAB — TRIGLYCERIDES: Triglycerides: 149 mg/dL (ref <150)

## 2023-07-06 MED ORDER — HYDRALAZINE HCL 20 MG/ML IJ SOLN
10.0000 mg | Freq: Four times a day (QID) | INTRAMUSCULAR | Status: DC | PRN
  Administered 2023-07-06 – 2023-07-12 (×3): 10 mg via INTRAVENOUS
  Filled 2023-07-06 (×3): qty 1

## 2023-07-06 MED ORDER — HALOPERIDOL LACTATE 5 MG/ML IJ SOLN
2.5000 mg | Freq: Four times a day (QID) | INTRAMUSCULAR | Status: AC | PRN
Start: 2023-07-06 — End: 2023-07-06
  Administered 2023-07-06: 2.5 mg via INTRAVENOUS
  Filled 2023-07-06: qty 1

## 2023-07-06 MED ORDER — METOPROLOL TARTRATE 25 MG PO TABS
25.0000 mg | ORAL_TABLET | Freq: Two times a day (BID) | ORAL | Status: DC
  Administered 2023-07-06 – 2023-07-07 (×4): 25 mg via ORAL
  Filled 2023-07-06 (×4): qty 1

## 2023-07-06 MED ORDER — HALOPERIDOL LACTATE 5 MG/ML IJ SOLN: 2.5000 mg | Freq: Once | INTRAMUSCULAR | Status: DC

## 2023-07-06 MED ORDER — INSULIN ASPART 100 UNIT/ML IJ SOLN
0.0000 [IU] | INTRAMUSCULAR | Status: DC
  Administered 2023-07-06: 2 [IU] via SUBCUTANEOUS
  Administered 2023-07-06: 4 [IU] via SUBCUTANEOUS
  Administered 2023-07-07 (×3): 2 [IU] via SUBCUTANEOUS

## 2023-07-06 MED ORDER — DEXMEDETOMIDINE HCL IN NACL 400 MCG/100ML IV SOLN
INTRAVENOUS | Status: AC
  Administered 2023-07-06: 0.4 ug/kg/h via INTRAVENOUS
  Filled 2023-07-06: qty 100

## 2023-07-06 MED ORDER — HALOPERIDOL LACTATE 5 MG/ML IJ SOLN
5.0000 mg | Freq: Once | INTRAMUSCULAR | Status: AC
  Administered 2023-07-06: 5 mg via INTRAVENOUS
  Filled 2023-07-06: qty 1

## 2023-07-06 MED ORDER — DEXMEDETOMIDINE HCL IN NACL 400 MCG/100ML IV SOLN
0.0000 ug/kg/h | INTRAVENOUS | Status: DC
  Filled 2023-07-06: qty 100

## 2023-07-06 MED ORDER — ENOXAPARIN SODIUM 30 MG/0.3ML IJ SOSY: 30.0000 mg | PREFILLED_SYRINGE | Freq: Every day | INTRAMUSCULAR | Status: DC

## 2023-07-06 MED ORDER — ENOXAPARIN SODIUM 40 MG/0.4ML IJ SOSY
40.0000 mg | PREFILLED_SYRINGE | Freq: Every day | INTRAMUSCULAR | Status: DC
  Administered 2023-07-06 – 2023-07-11 (×6): 40 mg via SUBCUTANEOUS
  Filled 2023-07-06 (×6): qty 0.4

## 2023-07-06 NOTE — Progress Notes (Signed)
      [address]       [place] [address]             [phone]                 1 Day Post-Op Procedure(s) (LRB): AORTIC VALVE REPLACEMENT WITH INSPIRIS AORTIC VALVE (N/A) CORONARY ARTERY BYPASS GRAFTING, USING LEFT INTERNAL MAMMARY ARTERY (N/A) TRANSESOPHAGEAL ECHOCARDIOGRAM (N/A)   Events: No events Some confusion _______________________________________________________________ Vitals: BP (!) 119/42   Pulse (!) 53   Temp 99.5 F (37.5 C)   Resp (!) 26   Ht 5\' 7"  (1.702 m)   Wt 69.3 kg   SpO2 99%   BMI 23.93 kg/m  Filed Weights   07/05/23 0543 07/06/23 0500  Weight: 66.4 kg 69.3 kg     - Neuro: alert NAD  - Cardiovascular: sinus  Drips: cleviprex .   PAP: (23-83)/(5-55) 35/10 CO:  [2.9 L/min-5.2 L/min] 5.2 L/min CI:  [1.6 L/min/m2-3.8 L/min/m2] 3.8 L/min/m2  - Pulm: EWOB  ABG    Component Value Date/Time   PHART 7.363 07/05/2023 2208   PCO2ART 37.4 07/05/2023 2208   PO2ART 60 (L) 07/05/2023 2208   HCO3 21.2 07/05/2023 2208   TCO2 22 07/05/2023 2208   ACIDBASEDEF 4.0 (H) 07/05/2023 2208   O2SAT 90 07/05/2023 2208    - Abd: ND - Extremity: warm  .Intake/Output      11/08 0701 11/09 0700 11/09 0701 11/10 0700   I.V. (mL/kg) 2732.9 (39.4)    Blood 367.5    NG/GT 60    IV Piggyback 2618.6    Total Intake(mL/kg) 5779.1 (83.4)    Urine (mL/kg/hr) 1985 (1.2) 23 (0.1)   Blood 1100    Chest Tube 810 50   Total Output 3895 73   Net +1884.1 -73           _______________________________________________________________ Labs:    Latest Ref Rng & Units 07/06/2023    4:18 AM 07/05/2023   10:08 PM 07/05/2023    8:28 PM  CBC  WBC 4.0 - 10.5 K/uL 9.9     Hemoglobin 13.0 - 17.0 g/dL 9.2  9.5  9.9   Hematocrit 39.0 - 52.0 % 27.5  28.0  29.0   Platelets 150 - 400 K/uL 93         Latest Ref Rng & Units 07/06/2023    4:18 AM 07/05/2023   10:08 PM 07/05/2023    8:28 PM  CMP  Glucose 70 - 99 mg/dL 403     BUN 8 - 23 mg/dL 13      Creatinine 4.74 - 1.24 mg/dL 2.59     Sodium 563 - 875 mmol/L 134  139  139   Potassium 3.5 - 5.1 mmol/L 3.8  4.1  3.9   Chloride 98 - 111 mmol/L 107     CO2 22 - 32 mmol/L 22     Calcium 8.9 - 10.3 mg/dL 8.3       CXR: PV congestion  _______________________________________________________________  Assessment and Plan: POD 1 s/p AVR/CABG  Neuro: pain controlled CV: will remove swan and A line.  Will keep pacing wires.  Adding BB, and hydral for BP. Pulm: IS, ambulation Renal: creat stable. GI: on diet Heme: stable ID: [email or web address] Endo: SSI Dispo: continue ICU care   Corliss Skains 07/06/2023 9:56 AM

## 2023-07-06 NOTE — Progress Notes (Signed)
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 He was not a good candidate for TAVR, and was referred for surgical AVR and CABG eval on 10/10.    He presented 11/8 for planned AVR and CABG x1 (LIMA LAD.   Admitted to ICU post op as per plan. PCCM consulted in this setting  Xclamp 76 min  Total pump 96 min  EBL 1100  Product about 600 cellsaver   Pertinent  Medical History  AI CAD HTN HLD CVA   Significant Hospital Events: Including procedures, antibiotic start and stop dates in addition to other pertinent events   AVR, CABG x1 (LIMA- LAD)   Interim History / Subjective:   Doing well post extubation. Up in bed this AM. Alert   Objective   Blood pressure (!) 119/42, pulse (!) 53, temperature 99.5 F (37.5 C), resp. rate (!) 26, height 5\' 7"  (1.702 m), weight 69.3 kg, SpO2 99%. PAP: (23-83)/(5-55) 35/10 CO:  [2.9 L/min-5.2 L/min] 5.2 L/min CI:  [1.6 L/min/m2-3.8 L/min/m2] 3.8 L/min/m2  Vent Mode: CPAP;PSV FiO2 (%):  [36 %-50 %] 36 % Set Rate:  [4 bmp-16 bmp] 4 bmp Vt Set:  [520 mL] 520 mL PEEP:  [5 cmH20] 5 cmH20 Pressure Support:  [10 cmH20] 10 cmH20 Plateau Pressure:  [21 cmH20] 21 cmH20   Intake/Output Summary (Last 24 hours) at 07/06/2023 6010 Last data filed at 07/06/2023 0800 Gross per 24 hour  Intake 5429.07 ml  Output 3770 ml  Net 1659.07 ml   Filed Weights   07/05/23 0543 07/06/23 0500  Weight: 66.4 kg 69.3 kg    Examination: General: elderly gentleman resting in bed  HENT: ncat tracking  Lungs: CTAB no wheeze  Cardiovascular: minimal chest drain output, rrr, s1 s2  Abdomen: soft nt nd  Extremities: no edema  Neuro: aaox3 GU:  deferred foley in place   Resolved Hospital Problem list     Assessment & Plan:   AI S/p AVR CAD s/p CABG x 1  Endotracheally intubated Hypotension, post bypass   expected ABLA  HTN HLD Hx cva  Hx hypothyroidism  P Tubes and line mtg per tcts  But I suspect can start to pull them  Continue multimodal pain control  Continue mobility  Transitioned off insulin infusion    Best Practice (right click and "Reselect all SmartList Selections" daily)   Diet/type: NPO DVT prophylaxis: not indicated-- completing txa  GI prophylaxis: PPI Lines: Central line, Arterial Line, and yes and it is still needed Foley:  Yes, and it is still needed Code Status:  full code Last date of multidisciplinary goals of care discussion [--]  Labs   CBC: Recent Labs  Lab 07/03/23 1500 07/05/23 0755 07/05/23 1050 07/05/23 1107 07/05/23 1340 07/05/23 1903 07/05/23 2028 07/05/23 2208 07/06/23 0418  WBC 8.7  --   --   --  8.0 9.4  --   --  9.9  NEUTROABS  --   --   --   --   --  6.9  --   --   --   HGB 14.9   < > 7.9*   < > 8.8*  9.9* 9.9* 9.5* 9.2*  HCT 45.4   < > 24.0*   < > 26.9* 29.2* 29.0* 28.0* 27.5*  MCV 86.0  --   --   --  85.4 83.2  --   --  82.8  PLT 177  --  110*  --  102* 87*  --   --  93*   < > = values in this interval not displayed.    Basic Metabolic Panel: Recent Labs  Lab 07/03/23 1500 07/05/23 0755 07/05/23 1107 07/05/23 1140 07/05/23 1336 07/05/23 1359 07/05/23 1903 07/05/23 2028 07/05/23 2208 07/06/23 0418  NA 140   < > 136 138   < > 138 136 139 139 134*  K 3.6   < > 4.4 4.2   < > 4.0 4.2 3.9 4.1 3.8  CL 104   < > 99 102  --  109 108  --   --  107  CO2 29  --   --   --   --  20* 22  --   --  22  GLUCOSE 81   < > 139* 134*  --  125* 133*  --   --  143*  BUN 11   < > 12 12  --  11 12  --   --  13  CREATININE 0.81   < > 0.80 0.80  --  0.99 0.91  --   --  1.21  CALCIUM 9.4  --   --   --   --  7.8* 8.2*  --   --  8.3*  MG  --   --   --   --   --   --   --   --    --  3.0*   < > = values in this interval not displayed.   GFR: Estimated Creatinine Clearance: 43.2 mL/min (by C-G formula based on SCr of 1.21 mg/dL). Recent Labs  Lab 07/03/23 1500 07/05/23 1340 07/05/23 1903 07/06/23 0418  WBC 8.7 8.0 9.4 9.9    Liver Function Tests: Recent Labs  Lab 07/03/23 1500  AST 22  ALT 15  ALKPHOS 55  BILITOT 1.6*  PROT 6.3*  ALBUMIN 3.8   No results for input(s): "LIPASE", "AMYLASE" in the last 168 hours. No results for input(s): "AMMONIA" in the last 168 hours.  ABG    Component Value Date/Time   PHART 7.363 07/05/2023 2208   PCO2ART 37.4 07/05/2023 2208   PO2ART 60 (L) 07/05/2023 2208   HCO3 21.2 07/05/2023 2208   TCO2 22 07/05/2023 2208   ACIDBASEDEF 4.0 (H) 07/05/2023 2208   O2SAT 90 07/05/2023 2208     Coagulation Profile: Recent Labs  Lab 07/03/23 1500 07/05/23 1340  INR 1.0 1.8*    Cardiac Enzymes: No results for input(s): "CKTOTAL", "CKMB", "CKMBINDEX", "TROPONINI" in the last 168 hours.  HbA1C: Hgb A1c MFr Bld  Date/Time Value Ref Range Status  07/03/2023 02:38 PM 6.1 (H) 4.8 - 5.6 % Final    Comment:    (NOTE) Pre diabetes:          5.7%-6.4%  Diabetes:              >6.4%  Glycemic control for   <7.0% adults with diabetes   03/21/2023 10:59 AM 6.6 (H) 4.8 - 5.6 % Final    Comment:             Prediabetes: 5.7 - 6.4          Diabetes: >6.4  Glycemic control for adults with diabetes: <7.0     CBG: Recent Labs  Lab 07/06/23 0304 07/06/23 0411 07/06/23 0454 07/06/23 0624 07/06/23 0750  GLUCAP 149* 134* 139* 142* 140*    Review of Systems:   Unable to obtain intubated and sedated   Past Medical History:  He,  has a past medical history of Anemia, Aortic regurgitation (03/12/2017), Aortic stenosis (04/29/2019), Asthma, Complication of anesthesia, Diabetes mellitus without complication (HCC), Essential hypertension (03/12/2017), Frequency of urination, GERD (gastroesophageal reflux  disease), Heart murmur, Hemorrhoids, History of kidney stones, colon cancer, stage I, nonmelanoma skin cancer, peptic ulcer, transfusion, Hyperlipidemia, Hypertension, Inguinal hernia, PONV (postoperative nausea and vomiting), Prostate cancer (HCC) (08/15/2012), Right inguinal hernia (05/08/2016), S/P right inguinal hernia repair Sept 2015 (05/17/2014), and Stroke (HCC) (03/28/2022).   Surgical History:   Past Surgical History:  Procedure Laterality Date   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   Procedure: AORTIC VALVE REPLACEMENT WITH INSPIRIS AORTIC VALVE;  Surgeon: [name], MD;  Location: [facility] OR;  Service: Open Heart Surgery;  Laterality: N/A;   cataract surgery  2009   CORONARY ARTERY BYPASS GRAFT N/A 07/05/2023   Procedure: CORONARY ARTERY BYPASS GRAFTING, USING LEFT INTERNAL MAMMARY ARTERY;  Surgeon: [name], MD;  Location: [facility] OR;  Service: Open Heart Surgery;  Laterality: N/A;   CYSTOSCOPY     INGUINAL HERNIA REPAIR Right 05/17/2014   Procedure: RIGHT INGUINAL HERNIA REPAIR ;  Surgeon: Wenda Low, MD;  Location: [facility] ORS;  Service: General;  Laterality: Right;  With MESH   KNEE SURGERY Right 2012   LEFT HEART CATH AND CORONARY ANGIOGRAPHY N/A 05/03/2023   Procedure: LEFT HEART CATH AND CORONARY ANGIOGRAPHY;  Surgeon: Tonny Bollman, MD;  [facility]: [facility] INVASIVE CV LAB;  Service: Cardiovascular;  Laterality: N/A;   PARTIAL COLECTOMY  2005   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   RADIOACTIVE SEED IMPLANT N/A 11/24/2012   Procedure: RADIOACTIVE SEED IMPLANT;  Surgeon: Valetta Fuller, MD;  Location: [facility];  Service: Urology;  Laterality: N/A;   71seeds implanted  no seeds found in bladder   TEE WITHOUT CARDIOVERSION N/A 07/05/2023   Procedure: TRANSESOPHAGEAL ECHOCARDIOGRAM;  Surgeon: [name], MD;  Location[facility];  Service: Open Heart Surgery;  Laterality: N/A;   THROAT SURGERY  1940's   as child (age 82)growth that created fissure/corrective  surgery   TONSILLECTOMY     age 37   XI ROBOTIC ASSISTED INGUINAL HERNIA REPAIR WITH MESH Right 05/08/2016   Procedure: XI ROBOTIC ASSISTED REPAIR OF RECURRENT RIGHT INGUINAL HERNIA;  Surgeon: Luretha Murphy, MD;  Location: [facility] ORS;  Service: General;  Laterality: Right;  With MESH     Social History:   reports that he has never smoked. He has never been exposed to tobacco smoke. He has never used smokeless tobacco. He reports that he does not drink alcohol and does not use drugs.   Family History:  His family history includes Brain cancer in an other family member; Coronary artery disease in an other family member; Heart attack in an other family member; Prostate cancer in an other family member.   Allergies Allergies  Allergen Reactions   Penicillins Anaphylaxis, Shortness Of Breath, Itching and Swelling    Throat swells up but no intubation per wife   Iodinated Contrast Media Hives and Rash   Aspirin Other (See Comments)    GI bleeding-avoids   Metformin And Related Diarrhea   Monodox [Doxycycline Hyclate] Nausea Only    "  made me feel sick"   Sulfa Antibiotics Itching and Swelling   Tramadol Nausea And Vomiting     Home Medications  Prior to Admission medications   Medication Sig Start Date End Date Taking? Authorizing Provider  albuterol (PROVENTIL HFA;VENTOLIN HFA) 108 (90 BASE) MCG/ACT inhaler Inhale 2 puffs into the lungs every 6 (six) hours as needed for shortness of breath.   Yes [provider]  bisoprolol-hydrochlorothiazide (ZIAC) 10-6.25 MG per tablet Take 1 tablet by mouth every morning.    Yes [provider]  budesonide-formoterol (SYMBICORT) 160-4.5 MCG/ACT inhaler Inhale 2 puffs into the lungs 2 (two) times daily. 03/08/23  Yes [provider]  clopidogrel (PLAVIX) 75 MG tablet Take 1 tablet (75 mg total) by mouth daily. 03/21/23 03/20/24 Yes Micki Riley, MD  fexofenadine (ALLEGRA) 180 MG tablet Take 180 mg by mouth daily.   Yes [provider]  fluticasone (FLONASE) 50 MCG/ACT nasal spray Place 1 spray into both nostrils daily.    Yes [provider]  glimepiride (AMARYL) 2 MG tablet Take 2 mg by mouth in the morning. 01/11/23  Yes [provider]  isosorbide mononitrate (IMDUR) 30 MG 24 hr tablet Take 1 tablet (30 mg total) by mouth daily. 04/22/23  Yes Baldo Daub, MD  levothyroxine (SYNTHROID) 75 MCG tablet Take 75 mcg by mouth daily before breakfast.   Yes [provider]  Multiple Vitamin (MULTIVITAMIN WITH MINERALS) TABS tablet Take 1 tablet by mouth daily.   Yes [provider]  nitroGLYCERIN (NITROSTAT) 0.4 MG SL tablet DISSOLVE 1 TABLET UNDER THE TONGUE EVERY 5 MINUTES AS NEEDED FOR CHEST PAIN. DO NOT EXCEED A TOTAL OF 3 DOSES IN 15 MINUTES. 01/03/23  Yes Baldo Daub, MD  omeprazole (PRILOSEC) 20 MG capsule Take 20 mg by mouth in the morning.   Yes [provider]  rosuvastatin (CRESTOR) 20 MG tablet Take 1 tablet (20 mg total) by mouth daily. 01/22/23  Yes Baldo Daub, MD  solifenacin (VESICARE) 10 MG tablet Take 10 mg by mouth in the morning. 01/17/21  Yes [provider]  Tamsulosin HCl (FLOMAX) 0.4 MG CAPS Take 0.4 mg by mouth 2 (two) times daily.    Yes [provider]  cyanocobalamin (,VITAMIN B-12,) 1000 MCG/ML injection Inject 1 mL into the skin every 30 (thirty) days. 02/04/19   [provider]  dicyclomine (BENTYL) 10 MG capsule Take 10 mg by mouth 2 (two) times daily as needed for cramping (abdominal pain/diarrhea.). 02/13/22   [provider]  furosemide (LASIX) 20 MG tablet Take 10-20 mg by mouth daily as needed for fluid or edema. 12/31/22   [provider]      Josephine Igo, DO [facility] 07/06/2023 9:27 AM

## 2023-07-06 NOTE — Progress Notes (Signed)
1800 Patient's family left bedside.  Patient very anxious acting.  Pulling at all lines including chest tubes and epicardial pacing wires.  Redirectable but only for a few seconds then resumes pulling at lines and wires.  Confused x3 only alert to self.  Dossie Arbour, NP on unit aware.  Ordered tele-sitter. Patient attempting to get out of bed not following sternal precautions.  Contacted provider for PRN agitation orders.

## 2023-07-06 NOTE — Anesthesia Postprocedure Evaluation (Signed)
Anesthesia Post Note  Patient: Bruce Nunez  Procedure(s) Performed: AORTIC VALVE REPLACEMENT WITH INSPIRIS AORTIC VALVE (Chest) CORONARY ARTERY BYPASS GRAFTING, USING LEFT INTERNAL MAMMARY ARTERY (Chest) TRANSESOPHAGEAL ECHOCARDIOGRAM     Patient location during evaluation: SICU Anesthesia Type: General Level of consciousness: sedated Pain management: pain level controlled Vital Signs Assessment: post-procedure vital signs reviewed and stable Respiratory status: patient remains intubated per anesthesia plan Cardiovascular status: stable Postop Assessment: no apparent nausea or vomiting Anesthetic complications: no   No notable events documented.  Last Vitals:  Vitals:   07/06/23 1245 07/06/23 1300  BP:  123/65  Pulse: 80 89  Resp: 13 (!) 21  Temp: 37.4 C   SpO2: 100% 99%    Last Pain:  Vitals:   07/06/23 1034  TempSrc:   PainSc: 0-No pain                 Earl Lites P Sallyann Kinnaird

## 2023-07-06 NOTE — Plan of Care (Signed)

## 2023-07-07 ENCOUNTER — Inpatient Hospital Stay (HOSPITAL_COMMUNITY): Payer: Medicare Other

## 2023-07-07 DIAGNOSIS — Z952 Presence of prosthetic heart valve: Secondary | ICD-10-CM | POA: Diagnosis not present

## 2023-07-07 LAB — CBC
HCT: 27.7 % — ABNORMAL LOW (ref 39.0–52.0)
Hemoglobin: 9 g/dL — ABNORMAL LOW (ref 13.0–17.0)
MCH: 28.2 pg (ref 26.0–34.0)
MCHC: 32.5 g/dL (ref 30.0–36.0)
MCV: 86.8 fL (ref 80.0–100.0)
Platelets: 67 10*3/uL — ABNORMAL LOW (ref 150–400)
RBC: 3.19 MIL/uL — ABNORMAL LOW (ref 4.22–5.81)
RDW: 14.3 % (ref 11.5–15.5)
WBC: 8 10*3/uL (ref 4.0–10.5)
nRBC: 0 % (ref 0.0–0.2)

## 2023-07-07 LAB — GLUCOSE, CAPILLARY
Glucose-Capillary: 121 mg/dL — ABNORMAL HIGH (ref 70–99)
Glucose-Capillary: 122 mg/dL — ABNORMAL HIGH (ref 70–99)
Glucose-Capillary: 95 mg/dL (ref 70–99)
Glucose-Capillary: 130 mg/dL — ABNORMAL HIGH (ref 70–99)
Glucose-Capillary: 121 mg/dL — ABNORMAL HIGH (ref 70–99)

## 2023-07-07 LAB — BASIC METABOLIC PANEL
Anion gap: 5 (ref 5–15)
BUN: 16 mg/dL (ref 8–23)
CO2: 22 mmol/L (ref 22–32)
Calcium: 8 mg/dL — ABNORMAL LOW (ref 8.9–10.3)
Chloride: 104 mmol/L (ref 98–111)
Creatinine, Ser: 1.17 mg/dL (ref 0.61–1.24)
GFR, Estimated: >60 mL/min (ref >60)
Glucose, Bld: 126 mg/dL — ABNORMAL HIGH (ref 70–99)
Potassium: 4.5 mmol/L (ref 3.5–5.1)
Sodium: 131 mmol/L — ABNORMAL LOW (ref 135–145)

## 2023-07-07 LAB — MAGNESIUM: Magnesium: 2.5 mg/dL — ABNORMAL HIGH (ref 1.7–2.4)

## 2023-07-07 MED ORDER — INSULIN ASPART 100 UNIT/ML IJ SOLN
0.0000 [IU] | Freq: Three times a day (TID) | INTRAMUSCULAR | Status: DC
  Administered 2023-07-08 – 2023-07-11 (×7): 2 [IU] via SUBCUTANEOUS

## 2023-07-07 MED ORDER — FUROSEMIDE 10 MG/ML IJ SOLN
40.0000 mg | Freq: Once | INTRAMUSCULAR | Status: AC
  Administered 2023-07-07: 40 mg via INTRAVENOUS
  Filled 2023-07-07: qty 4

## 2023-07-07 NOTE — Progress Notes (Signed)
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 He was not a good candidate for TAVR, and was referred for surgical AVR and CABG eval on 10/10.    He presented 11/8 for planned AVR and CABG x1 (LIMA LAD.   Admitted to ICU post op as per plan. PCCM consulted in this setting  Xclamp 76 min  Total pump 96 min  EBL 1100  Product about 600 cellsaver   Pertinent  Medical History  AI CAD HTN HLD CVA   Significant Hospital Events: Including procedures, antibiotic start and stop dates in addition to other pertinent events   AVR, CABG x1 (LIMA- LAD)   Interim History / Subjective:   Doing well post extubation. Up in bed this AM. Alert   Objective   Blood pressure (!) 127/59, pulse 76, temperature 98.2 F (36.8 C), temperature source Oral, resp. rate 13, height 5\' 7"  (1.702 m), weight 71.4 kg, SpO2 99%. PAP: (33-46)/(12-27) 40/16  FiO2 (%):  [28 %] 28 %   Intake/Output Summary (Last 24 hours) at 07/07/2023 0919 Last data filed at 07/07/2023 0900 Gross per 24 hour  Intake 368.57 ml  Output 1085 ml  Net -716.43 ml   Filed Weights   07/05/23 0543 07/06/23 0500 07/07/23 0500  Weight: 66.4 kg 69.3 kg 71.4 kg    Examination: General: elderly gentleman, resting in bed, needed a digital sitter HENT: NCAT tracking  Lungs: CTAB no wheeze, no crackles  Cardiovascular: RRR s1 s2  Abdomen: soft nt nd Extremities: no edema  Neuro: AAOx3  GU: foley   Resolved Hospital Problem list     Assessment & Plan:   AI S/p AVR CAD s/p CABG x 1  Endotracheally intubated Hypotension, post bypass   expected ABLA  HTN HLD Hx cva  Hx hypothyroidism  P: Tube and lines  removed  Multimodal pain control  PT OT  Delirium precautions   Best Practice (right click and "Reselect all SmartList Selections" daily)   Diet/type: NPO DVT prophylaxis: not indicated-- completing txa  GI prophylaxis: PPI Lines: Central line, Arterial Line, and yes and it is still needed Foley:  Yes, and it is still needed Code Status:  full code Last date of multidisciplinary goals of care discussion [--]  Labs   CBC: Recent Labs  Lab 07/05/23 1340 07/05/23 1903 07/05/23 2028 07/05/23 2208 07/06/23 0418 07/06/23 1747 07/07/23 0323  WBC 8.0 9.4  --   --  9.9 13.2* 8.0  NEUTROABS  --  6.9  --   --   --   --   --   HGB 8.8* 9.9* 9.9* 9.5* 9.2* 10.7* 9.0*  HCT 26.9* 29.2* 29.0* 28.0* 27.5* 32.3* 27.7*  MCV 85.4 83.2  --   --  82.8 83.9 86.8  PLT 102* 87*  --   --  93* 104* 67*    Basic Metabolic Panel: Recent Labs  Lab 07/05/23 1359 07/05/23 1903 07/05/23 2028 07/05/23 2208 07/06/23 0418 07/06/23 1747 07/07/23 0323  NA 138 136 139 139 134* 130* 131*  K 4.0 4.2 3.9 4.1 3.8 4.2 4.5  CL 109 108  --   --  107 103 104  CO2 20* 22  --   --  22 18* 22  GLUCOSE 125* 133*  --   --  143* 108* 126*  BUN 11 12  --   --  13 17 16   CREATININE 0.99 0.91  --   --  1.21 1.33* 1.17  CALCIUM 7.8* 8.2*  --   --  8.3* 8.2* 8.0*  MG  --   --   --   --  3.0* 2.6* 2.5*   GFR: Estimated Creatinine Clearance: 44.7 mL/min (by C-G formula based on SCr of 1.17 mg/dL). Recent Labs  Lab 07/05/23 1903 07/06/23 0418 07/06/23 1747 07/07/23 0323  WBC 9.4 9.9 13.2* 8.0    Liver Function Tests: Recent Labs  Lab 07/03/23 1500  AST 22  ALT 15  ALKPHOS 55  BILITOT 1.6*  PROT 6.3*  ALBUMIN 3.8   No results for input(s): "LIPASE", "AMYLASE" in the last 168 hours. No results for input(s): "AMMONIA" in the last 168 hours.  ABG    Component Value Date/Time   PHART 7.363 07/05/2023 2208   PCO2ART 37.4 07/05/2023 2208   PO2ART 60 (L) 07/05/2023 2208   HCO3 21.2 07/05/2023 2208    TCO2 22 07/05/2023 2208   ACIDBASEDEF 4.0 (H) 07/05/2023 2208   O2SAT 90 07/05/2023 2208     Coagulation Profile: Recent Labs  Lab 07/03/23 1500 07/05/23 1340  INR 1.0 1.8*    Cardiac Enzymes: No results for input(s): "CKTOTAL", "CKMB", "CKMBINDEX", "TROPONINI" in the last 168 hours.  HbA1C: Hgb A1c MFr Bld  Date/Time Value Ref Range Status  07/03/2023 02:38 PM 6.1 (H) 4.8 - 5.6 % Final    Comment:    (NOTE) Pre diabetes:          5.7%-6.4%  Diabetes:              >6.4%  Glycemic control for   <7.0% adults with diabetes   03/21/2023 10:59 AM 6.6 (H) 4.8 - 5.6 % Final    Comment:             Prediabetes: 5.7 - 6.4          Diabetes: >6.4          Glycemic control for adults with diabetes: <7.0     CBG: Recent Labs  Lab 07/06/23 1123 07/06/23 1638 07/06/23 2007 07/07/23 0328 07/07/23 0833  GLUCAP 163* 111* 122* 121* 122*    Review of Systems:   Unable to obtain intubated and sedated   Past Medical History:  He,  has a past medical history of Anemia, Aortic regurgitation (03/12/2017), Aortic stenosis (04/29/2019), Asthma, Complication of anesthesia, Diabetes mellitus without complication (HCC), Essential hypertension (03/12/2017), Frequency of urination, GERD (gastroesophageal reflux disease), Heart murmur, Hemorrhoids, History of kidney stones, colon cancer, stage I, nonmelanoma skin cancer, peptic ulcer, transfusion, Hyperlipidemia, Hypertension, Inguinal hernia, PONV (postoperative nausea and vomiting), Prostate cancer (HCC) (08/15/2012), Right inguinal hernia (05/08/2016), S/P right inguinal hernia repair Sept 2015 (05/17/2014), and Stroke (HCC) (03/28/2022).   Surgical History:   Past Surgical History:  Procedure Laterality Date   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   Procedure: AORTIC VALVE REPLACEMENT WITH INSPIRIS AORTIC VALVE;  Surgeon: [name], MD;  Location: [facility] OR;  Service: Open Heart Surgery;  Laterality: N/A;   cataract surgery   2009   CORONARY ARTERY BYPASS GRAFT N/A 07/05/2023   Procedure: CORONARY ARTERY BYPASS GRAFTING, USING LEFT INTERNAL MAMMARY ARTERY;  Surgeon: [name], MD;  Location: [facility] OR;  Service: Open Heart Surgery;  Laterality: N/A;   CYSTOSCOPY     INGUINAL HERNIA REPAIR Right 05/17/2014   Procedure: RIGHT INGUINAL HERNIA REPAIR ;  Surgeon: Wenda Low, MD;  Location: [facility] ORS;  Service: General;  Laterality: Right;  With MESH   KNEE SURGERY Right 2012   LEFT HEART CATH AND CORONARY ANGIOGRAPHY N/A 05/03/2023   Procedure: LEFT HEART CATH AND CORONARY ANGIOGRAPHY;  Surgeon: Tonny Bollman, MD;  Location: [facility] INVASIVE CV LAB;  Service: Cardiovascular;  Laterality: N/A;   PARTIAL COLECTOMY  2005   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   RADIOACTIVE SEED IMPLANT N/A 11/24/2012   Procedure: RADIOACTIVE SEED IMPLANT;  Surgeon: Valetta Fuller, MD;  Location: [facility];  Service: Urology;  Laterality: N/A;   71seeds implanted  no seeds found in bladder   TEE WITHOUT CARDIOVERSION N/A 07/05/2023   Procedure: TRANSESOPHAGEAL ECHOCARDIOGRAM;  Surgeon: [name], MD;  Location: [facility] OR;  Service: Open Heart Surgery;  Laterality: N/A;   THROAT SURGERY  1940's   as child (age 28)growth that created fissure/corrective surgery   TONSILLECTOMY     age 68   XI ROBOTIC ASSISTED INGUINAL HERNIA REPAIR WITH MESH Right 05/08/2016   Procedure: XI ROBOTIC ASSISTED REPAIR OF RECURRENT RIGHT INGUINAL HERNIA;  Surgeon: Luretha Murphy, MD;  Location: [facility] ORS;  Service: General;  Laterality: Right;  With MESH     Social History:   reports that he has never smoked. He has never been exposed to tobacco smoke. He has never used smokeless tobacco. He reports that he does not drink alcohol and does not use drugs.   Family History:  His family history includes Brain cancer in an other family member; Coronary artery disease in an other family member; Heart attack in an other family member; Prostate cancer in an  other family member.   Allergies Allergies  Allergen Reactions   Penicillins Anaphylaxis, Shortness Of Breath, Itching and Swelling    Throat swells up but no intubation per wife   Iodinated Contrast Media Hives and Rash   Aspirin Other (See Comments)    GI bleeding-avoids   Metformin And Related Diarrhea   Monodox [Doxycycline Hyclate] Nausea Only    "made me feel sick"   Sulfa Antibiotics Itching and Swelling   Tramadol Nausea And Vomiting     Home Medications  Prior to Admission medications   Medication Sig Start Date End Date Taking? Authorizing Provider  albuterol (PROVENTIL HFA;VENTOLIN HFA) 108 (90 BASE) MCG/ACT inhaler Inhale 2 puffs into the lungs every 6 (six) hours as needed for shortness of breath.   Yes [provider]  bisoprolol-hydrochlorothiazide (ZIAC) 10-6.25 MG per tablet Take 1 tablet by mouth every morning.    Yes [provider]  budesonide-formoterol (SYMBICORT) 160-4.5 MCG/ACT inhaler Inhale 2 puffs into the lungs 2 (two) times daily. 03/08/23  Yes [provider]  clopidogrel (PLAVIX) 75 MG tablet Take 1 tablet (75 mg total) by mouth daily. 03/21/23 03/20/24 Yes Micki Riley, MD  fexofenadine (ALLEGRA) 180 MG tablet Take 180 mg by mouth daily.   Yes [provider]  fluticasone (FLONASE) 50 MCG/ACT nasal spray Place 1 spray into both nostrils daily.    Yes [provider]  glimepiride (AMARYL) 2 MG tablet Take 2 mg by mouth in the morning. 01/11/23  Yes [provider]  isosorbide mononitrate (IMDUR) 30 MG 24 hr tablet Take 1 tablet (30 mg total) by mouth daily. 04/22/23  Yes Norman Herrlich  J, MD  levothyroxine (SYNTHROID) 75 MCG tablet Take 75 mcg by mouth daily before breakfast.   Yes [provider]  Multiple Vitamin (MULTIVITAMIN WITH MINERALS) TABS tablet Take 1 tablet by mouth daily.   Yes [provider]  nitroGLYCERIN (NITROSTAT) 0.4 MG SL tablet DISSOLVE 1 TABLET UNDER THE TONGUE  EVERY 5 MINUTES AS NEEDED FOR CHEST PAIN. DO NOT EXCEED A TOTAL OF 3 DOSES IN 15 MINUTES. 01/03/23  Yes Baldo Daub, MD  omeprazole (PRILOSEC) 20 MG capsule Take 20 mg by mouth in the morning.   Yes [provider]  rosuvastatin (CRESTOR) 20 MG tablet Take 1 tablet (20 mg total) by mouth daily. 01/22/23  Yes Baldo Daub, MD  solifenacin (VESICARE) 10 MG tablet Take 10 mg by mouth in the morning. 01/17/21  Yes [provider]  Tamsulosin HCl (FLOMAX) 0.4 MG CAPS Take 0.4 mg by mouth 2 (two) times daily.    Yes [provider]  cyanocobalamin (,VITAMIN B-12,) 1000 MCG/ML injection Inject 1 mL into the skin every 30 (thirty) days. 02/04/19   [provider]  dicyclomine (BENTYL) 10 MG capsule Take 10 mg by mouth 2 (two) times daily as needed for cramping (abdominal pain/diarrhea.). 02/13/22   [provider]  furosemide (LASIX) 20 MG tablet Take 10-20 mg by mouth daily as needed for fluid or edema. 12/31/22   [provider]      Josephine Igo, DO  Pulmonary Critical Care 07/07/2023 9:19 AM

## 2023-07-07 NOTE — Plan of Care (Signed)
?  Problem: Education: ?Goal: Knowledge of General Education information will improve ?Description: Including pain rating scale, medication(s)/side effects and non-pharmacologic comfort measures ?Outcome: Progressing ?  ?Problem: Clinical Measurements: ?Goal: Ability to maintain clinical measurements within normal limits will improve ?Outcome: Progressing ?  ?Problem: Nutrition: ?Goal: Adequate nutrition will be maintained ?Outcome: Progressing ?  ?Problem: Elimination: ?Goal: Will not experience complications related to bowel motility ?Outcome: Progressing ?  ?

## 2023-07-07 NOTE — Progress Notes (Addendum)
~  0500: Pt agitated & delirious, attempting to climb over the side rail out of bed and pulling at lines. Offered to help pt up with assistance but refused. Pt is noncompliant, not following commands and refuses medications, removal of foley catheter, dressing changes and getting up to weigh and walk in the morning. Pt AO x 1 and thinks that I broke into his house. Unable to reorient. Telesitter, fall mats, safety mitts, bed alarm and precedex gtt in place.

## 2023-07-07 NOTE — Plan of Care (Signed)
  Problem: Education: Goal: Knowledge of General Education information will improve Description: Including pain rating scale, medication(s)/side effects and non-pharmacologic comfort measures Outcome: Not Progressing   Problem: Health Behavior/Discharge Planning: Goal: Ability to manage health-related needs will improve Outcome: Not Progressing   Problem: Clinical Measurements: Goal: Ability to maintain clinical measurements within normal limits will improve Outcome: Not Progressing Goal: Will remain free from infection Outcome: Not Progressing Goal: Diagnostic test results will improve Outcome: Not Progressing Goal: Respiratory complications will improve Outcome: Not Progressing Goal: Cardiovascular complication will be avoided Outcome: Not Progressing   Problem: Activity: Goal: Risk for activity intolerance will decrease Outcome: Not Progressing   Problem: Nutrition: Goal: Adequate nutrition will be maintained Outcome: Not Progressing   Problem: Coping: Goal: Level of anxiety will decrease Outcome: Not Progressing   Problem: Elimination: Goal: Will not experience complications related to bowel motility Outcome: Not Progressing Goal: Will not experience complications related to urinary retention Outcome: Not Progressing   Problem: Pain Management: Goal: General experience of comfort will improve Outcome: Not Progressing   Problem: Safety: Goal: Ability to remain free from injury will improve Outcome: Not Progressing   Problem: Skin Integrity: Goal: Risk for impaired skin integrity will decrease Outcome: Not Progressing   Problem: Education: Goal: Will demonstrate proper wound care and an understanding of methods to prevent future damage Outcome: Not Progressing Goal: Knowledge of disease or condition will improve Outcome: Not Progressing Goal: Knowledge of the prescribed therapeutic regimen will improve Outcome: Not Progressing Goal: Individualized  Educational Video(s) Outcome: Not Progressing   Problem: Activity: Goal: Risk for activity intolerance will decrease Outcome: Not Progressing   Problem: Cardiac: Goal: Will achieve and/or maintain hemodynamic stability Outcome: Not Progressing   Problem: Clinical Measurements: Goal: Postoperative complications will be avoided or minimized Outcome: Not Progressing   Problem: Respiratory: Goal: Respiratory status will improve Outcome: Not Progressing   Problem: Skin Integrity: Goal: Wound healing without signs and symptoms of infection Outcome: Not Progressing Goal: Risk for impaired skin integrity will decrease Outcome: Not Progressing   Problem: Urinary Elimination: Goal: Ability to achieve and maintain adequate renal perfusion and functioning will improve Outcome: Not Progressing

## 2023-07-07 NOTE — Progress Notes (Signed)
1700 Epicardial pacing wires removed per order.  Atrial wires removed prior to ventricular wires.  No myocardial tissue noted on any pacer wires.  Patient had 3 PVC's after ventricular wire removal then returned to sinus rhythm.

## 2023-07-08 DIAGNOSIS — Z951 Presence of aortocoronary bypass graft: Secondary | ICD-10-CM | POA: Diagnosis not present

## 2023-07-08 DIAGNOSIS — Z952 Presence of prosthetic heart valve: Secondary | ICD-10-CM | POA: Diagnosis not present

## 2023-07-08 DIAGNOSIS — Z978 Presence of other specified devices: Secondary | ICD-10-CM | POA: Diagnosis not present

## 2023-07-08 LAB — BASIC METABOLIC PANEL
Anion gap: 7 (ref 5–15)
BUN: 18 mg/dL (ref 8–23)
CO2: 23 mmol/L (ref 22–32)
Calcium: 8.1 mg/dL — ABNORMAL LOW (ref 8.9–10.3)
Chloride: 101 mmol/L (ref 98–111)
Creatinine, Ser: 0.98 mg/dL (ref 0.61–1.24)
GFR, Estimated: >60 mL/min (ref >60)
Glucose, Bld: 117 mg/dL — ABNORMAL HIGH (ref 70–99)
Potassium: 4 mmol/L (ref 3.5–5.1)
Sodium: 131 mmol/L — ABNORMAL LOW (ref 135–145)

## 2023-07-08 LAB — MAGNESIUM: Magnesium: 2 mg/dL (ref 1.7–2.4)

## 2023-07-08 LAB — CBC
HCT: 28.6 % — ABNORMAL LOW (ref 39.0–52.0)
Hemoglobin: 9.2 g/dL — ABNORMAL LOW (ref 13.0–17.0)
MCH: 27.5 pg (ref 26.0–34.0)
MCHC: 32.2 g/dL (ref 30.0–36.0)
MCV: 85.6 fL (ref 80.0–100.0)
Platelets: 92 10*3/uL — ABNORMAL LOW (ref 150–400)
RBC: 3.34 MIL/uL — ABNORMAL LOW (ref 4.22–5.81)
RDW: 14.4 % (ref 11.5–15.5)
WBC: 8.7 10*3/uL (ref 4.0–10.5)
nRBC: 0 % (ref 0.0–0.2)

## 2023-07-08 LAB — GLUCOSE, CAPILLARY
Glucose-Capillary: 120 mg/dL — ABNORMAL HIGH (ref 70–99)
Glucose-Capillary: 141 mg/dL — ABNORMAL HIGH (ref 70–99)
Glucose-Capillary: 127 mg/dL — ABNORMAL HIGH (ref 70–99)
Glucose-Capillary: 111 mg/dL — ABNORMAL HIGH (ref 70–99)

## 2023-07-08 MED ORDER — FUROSEMIDE 10 MG/ML IJ SOLN
40.0000 mg | Freq: Once | INTRAMUSCULAR | Status: AC
  Administered 2023-07-08: 40 mg via INTRAVENOUS
  Filled 2023-07-08: qty 4

## 2023-07-08 MED ORDER — ASPIRIN 81 MG PO CHEW
81.0000 mg | CHEWABLE_TABLET | Freq: Every day | ORAL | Status: DC
  Administered 2023-07-08 – 2023-07-20 (×12): 81 mg via ORAL
  Filled 2023-07-08 (×13): qty 1

## 2023-07-08 MED ORDER — QUETIAPINE FUMARATE 25 MG PO TABS
25.0000 mg | ORAL_TABLET | Freq: Every day | ORAL | Status: DC
  Administered 2023-07-08 – 2023-07-19 (×12): 25 mg via ORAL
  Filled 2023-07-08 (×11): qty 1

## 2023-07-08 MED ORDER — CLOPIDOGREL BISULFATE 75 MG PO TABS
75.0000 mg | ORAL_TABLET | Freq: Every day | ORAL | Status: DC
  Administered 2023-07-08 – 2023-07-11 (×4): 75 mg via ORAL
  Filled 2023-07-08 (×4): qty 1

## 2023-07-08 MED ORDER — HALOPERIDOL 1 MG PO TABS
1.0000 mg | ORAL_TABLET | Freq: Four times a day (QID) | ORAL | Status: DC | PRN
  Administered 2023-07-08: 1 mg via ORAL
  Filled 2023-07-08 (×2): qty 1

## 2023-07-08 MED ORDER — QUETIAPINE FUMARATE 25 MG PO TABS
25.0000 mg | ORAL_TABLET | Freq: Every day | ORAL | Status: DC
  Filled 2023-07-08: qty 1

## 2023-07-08 MED ORDER — AMIODARONE HCL 200 MG PO TABS: 400.0000 mg | ORAL_TABLET | Freq: Two times a day (BID) | ORAL | Status: DC

## 2023-07-08 MED ORDER — METOPROLOL TARTRATE 50 MG PO TABS
50.0000 mg | ORAL_TABLET | Freq: Two times a day (BID) | ORAL | Status: DC
  Administered 2023-07-08 (×2): 50 mg via ORAL
  Filled 2023-07-08 (×2): qty 1

## 2023-07-08 NOTE — Progress Notes (Signed)
CARDIAC REHAB PHASE I   Pt sitting in chair, feeling well today. Pt reports feeling tired and limited mobility post op. Discussed PT/OT consult with ICU RN. Will continue to follow.   Woodroe Chen, RN BSN 07/08/2023 2:15 PM

## 2023-07-08 NOTE — Progress Notes (Signed)
   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 He was not a good candidate for TAVR, and was referred for surgical AVR and CABG eval on 10/10.    He presented 11/8 for planned AVR and CABG x1 (LIMA LAD.   Admitted to ICU post op as per plan. PCCM consulted in this setting  Xclamp 76 min  Total pump 96 min  EBL 1100  Product about 600 cellsaver   Pertinent  Medical History  AI CAD HTN HLD CVA   Significant Hospital Events: Including procedures, antibiotic start and stop dates in addition to other pertinent events   AVR, CABG x1 (LIMA- LAD)   Interim History / Subjective:   Developed delirium yesterday evening requiring Precedex.  Calm this morning but having episodes of agitation this afternoon again when family came to leave.  Did not receive any narcotics since last night.  Denies chest pain.  Remembers ambulating.  States that he slept well last night.  Objective   Blood pressure (!) 135/90, pulse (!) 102, temperature 98.5 F (36.9 C), temperature source Axillary, resp. rate 20, height 5\' 7"  (1.702 m), weight 69.6 kg, SpO2 96%.        Intake/Output Summary (Last 24 hours) at 07/08/2023 1554 Last data filed at 07/08/2023 0600 Gross per 24 hour  Intake 200 ml  Output 1170 ml  Net -970 ml   Filed Weights   07/06/23 0500 07/07/23 0500 07/08/23 0530  Weight: 69.3 kg 71.4 kg 69.6 kg    Examination: General: elderly gentleman, sitting in chair HENT: No scleral icterus.  Mucous membranes moist.  No Lungs: Clear on room air. Cardiovascular: RRR s1 s2.  Incision well Abdomen: soft nt nd Extremities: no edema  Neuro: Calm alert and oriented for me.   No focal deficits. GU: External catheter.  Ancillary Tests Personally Reviewed:   Mild hyponatremia 131 Mild thrombocytopenia 92 Anemia 9.2  Assessment & Plan:   Postoperative delirium AI S/p AVR CAD s/p CABG x 1  Now extubated - on room air Hypotension, post bypass - now resolved.   expected ABLA  HTN HLD Hx cva  Hx hypothyroidism   Plan:   -Intermittent periods of confusion.  Can be managed with as needed haloperidol. -Will start low-dose Seroquel anticipation of sundowning. -Delirium will likely improve with low stimulation environment outside of ICU. -Pain well-controlled so should not need additional narcotics. -Track thrombocytopenia. -Continue phase 1 cardiac rehabilitation.  Best Practice (right click and "Reselect all SmartList Selections" daily)   Diet/type: Heart healthy diet. DVT prophylaxis: Lovenox. GI prophylaxis: Not indicated. Lines: Moves all lines. Foley: External catheter. Code Status:  full code Last date of multidisciplinary goals of care discussion [--]  Lynnell Catalan, MD [facility] ICU Physician [facility]  Pager: [phone] Or Epic Secure Chat After hours: [phone].  07/08/2023, 4:06 PM

## 2023-07-08 NOTE — Progress Notes (Signed)
      [address]       [place] [address]             [phone]      POD # 3 AVR, CABG  Resting with family in room  BP (!) 117/93   Pulse (!) 103   Temp 97.7 F (36.5 C) (Oral)   Resp 17   Ht 5\' 7"  (1.702 m)   Wt 69.6 kg   SpO2 95%   BMI 24.03 kg/m    Intake/Output Summary (Last 24 hours) at 07/08/2023 1739 Last data filed at 07/08/2023 0600 Gross per 24 hour  Intake --  Output 670 ml  Net -670 ml   Awaiting tele bed  Viviann Spare C. Dorris Fetch, MD [facility] [phone]

## 2023-07-08 NOTE — Plan of Care (Signed)
  Problem: Education: Goal: Knowledge of General Education information will improve Description: Including pain rating scale, medication(s)/side effects and non-pharmacologic comfort measures Outcome: Progressing   Problem: Health Behavior/Discharge Planning: Goal: Ability to manage health-related needs will improve Outcome: Progressing   Problem: Clinical Measurements: Goal: Ability to maintain clinical measurements within normal limits will improve Outcome: Progressing Goal: Will remain free from infection Outcome: Progressing Goal: Diagnostic test results will improve Outcome: Progressing Goal: Respiratory complications will improve Outcome: Progressing Goal: Cardiovascular complication will be avoided Outcome: Progressing   Problem: Activity: Goal: Risk for activity intolerance will decrease Outcome: Progressing   Problem: Nutrition: Goal: Adequate nutrition will be maintained Outcome: Progressing   Problem: Coping: Goal: Level of anxiety will decrease Outcome: Progressing   Problem: Elimination: Goal: Will not experience complications related to bowel motility Outcome: Progressing Goal: Will not experience complications related to urinary retention Outcome: Progressing   Problem: Pain Management: Goal: General experience of comfort will improve Outcome: Progressing   Problem: Safety: Goal: Ability to remain free from injury will improve Outcome: Progressing   Problem: Skin Integrity: Goal: Risk for impaired skin integrity will decrease Outcome: Progressing   Problem: Education: Goal: Will demonstrate proper wound care and an understanding of methods to prevent future damage Outcome: Progressing Goal: Knowledge of disease or condition will improve Outcome: Progressing Goal: Knowledge of the prescribed therapeutic regimen will improve Outcome: Progressing Goal: Individualized Educational Video(s) Outcome: Progressing   Problem: Activity: Goal: Risk for  activity intolerance will decrease Outcome: Progressing   Problem: Cardiac: Goal: Will achieve and/or maintain hemodynamic stability Outcome: Progressing   Problem: Clinical Measurements: Goal: Postoperative complications will be avoided or minimized Outcome: Progressing   Problem: Respiratory: Goal: Respiratory status will improve Outcome: Progressing   Problem: Skin Integrity: Goal: Wound healing without signs and symptoms of infection Outcome: Progressing Goal: Risk for impaired skin integrity will decrease Outcome: Progressing   Problem: Urinary Elimination: Goal: Ability to achieve and maintain adequate renal perfusion and functioning will improve Outcome: Progressing

## 2023-07-08 NOTE — Progress Notes (Signed)
      [address]       [place] [address]             [phone]      3 Days Post-Op  Procedure(s) (LRB): AORTIC VALVE REPLACEMENT WITH INSPIRIS AORTIC VALVE (N/A) CORONARY ARTERY BYPASS GRAFTING, USING LEFT INTERNAL MAMMARY ARTERY (N/A) TRANSESOPHAGEAL ECHOCARDIOGRAM (N/A)   Total Length of Stay:  LOS: 3 days    SUBJECTIVE: Slept better Ambulated  Oriented much better overnight  Vitals:   07/08/23 0545 07/08/23 0600  BP:    Pulse:  (!) 107  Resp: (!) 28 (!) 30  Temp:  98.8 F (37.1 C)  SpO2:      Intake/Output      11/10 0701 11/11 0700 11/11 0701 11/12 0700   P.O. 600    I.V. (mL/kg) 5.7 (0.1)    Total Intake(mL/kg) 605.7 (8.7)    Urine (mL/kg/hr) 1540 (0.9)    Chest Tube 190    Total Output 1730    Net -1124.3              CBC    Component Value Date/Time   WBC 8.7 07/08/2023 0335   RBC 3.34 (L) 07/08/2023 0335   HGB 9.2 (L) 07/08/2023 0335   HGB 15.9 05/02/2023 1348   HCT 28.6 (L) 07/08/2023 0335   HCT 48.2 05/02/2023 1348   PLT 92 (L) 07/08/2023 0335   PLT 161 05/02/2023 1348   MCV 85.6 07/08/2023 0335   MCV 89 05/02/2023 1348   MCH 27.5 07/08/2023 0335   MCHC 32.2 07/08/2023 0335   RDW 14.4 07/08/2023 0335   RDW 13.2 05/02/2023 1348   LYMPHSABS 1.4 07/05/2023 1903   MONOABS 0.8 07/05/2023 1903   EOSABS 0.2 07/05/2023 1903   BASOSABS 0.0 07/05/2023 1903   CMP     Component Value Date/Time   NA 131 (L) 07/08/2023 0335   NA 140 05/02/2023 1348   K 4.0 07/08/2023 0335   CL 101 07/08/2023 0335   CO2 23 07/08/2023 0335   GLUCOSE 117 (H) 07/08/2023 0335   BUN 18 07/08/2023 0335   BUN 21 05/02/2023 1348   CREATININE 0.98 07/08/2023 0335   CALCIUM 8.1 (L) 07/08/2023 0335   PROT 6.3 (L) 07/03/2023 1500   PROT 6.2 12/09/2019 0957   ALBUMIN 3.8 07/03/2023 1500   ALBUMIN 4.3 12/09/2019 0957   AST 22 07/03/2023 1500   ALT 15 07/03/2023 1500   ALKPHOS 55 07/03/2023 1500   BILITOT 1.6 (H) 07/03/2023 1500    BILITOT 0.9 12/09/2019 0957   GFRNONAA >60 07/08/2023 0335   GFRAA 79 12/09/2019 0957   ABG    Component Value Date/Time   PHART 7.363 07/05/2023 2208   PCO2ART 37.4 07/05/2023 2208   PO2ART 60 (L) 07/05/2023 2208   HCO3 21.2 07/05/2023 2208   TCO2 22 07/05/2023 2208   ACIDBASEDEF 4.0 (H) 07/05/2023 2208   O2SAT 90 07/05/2023 2208   CBG (last 3)  Recent Labs    07/07/23 1635 07/07/23 2135 07/08/23 0640  GLUCAP 130* 121* 120*  EXAM Lungs:clear Card: RR (tachy) no murmur Ext: no edema Neuro: intact   ASSESSMENT: POD #3 SP AVR/CABG Hemodynamics ok. Will increase bb  Pulmonary: on RA. Weight up will dc chest tubes and continue to diurese Delerium post op has cleared. Most likely secondary to previous CVA and medication Plt count improving  Ok to go to floor   [name], MD 07/08/2023

## 2023-07-08 NOTE — TOC Initial Note (Addendum)
Transition of Care [facility]) - Initial/Assessment Note    Patient Details  Name: Bruce Nunez MRN: [id number] Date of Birth: Mar 07, 1941  [facility] CM/SW Contact:    Bruce Cousin, RN Phone Number: [phone] 07/08/2023, 5:05 PM  Clinical Narrative:     CM spoke to pt's son and states best to speak to wife. Contacted wife and states she would interested in IP rehab or SNF rehab. States he has cane at home. Explained waiting PT/OT evaluation and recommendations. States she is at home with patient. Pt does not have Longterm care policy or VA benefits for aide in home. Will continue to follow for dc needs.      Pt preoperatively arranged with [facility] for HH, (medicare.gov list placed on chart and provided to pt). Wife states she agreeable if IP rehab or SNF was not recommended.           Expected Discharge Plan: Home w Home Health Services Barriers to Discharge: Continued Medical Work up   Patient Goals and CMS Choice Patient states their goals for this hospitalization and ongoing recovery are:: wants husband to get better CMS Medicare.gov Compare Post Acute Care list provided to:: Patient Represenative (must comment) Choice offered to / list presented to : Spouse      Expected Discharge Plan and Services   Discharge Planning Services: CM Consult Post Acute Care Choice: IP Rehab Living arrangements for the past 2 months: Single Family Home                                      Prior Living Arrangements/Services Living arrangements for the past 2 months: Single Family Home Lives with:: Spouse   Do you feel safe going back to the place where you live?: Yes      Need for Family Participation in Patient Care: Yes (Comment) Care giver support system in place?: Yes (comment) Current home services: DME (cane)    Activities of Daily Living   ADL Screening (condition at time of admission) Independently performs ADLs?: Yes (appropriate for  developmental age) Is the patient deaf or have difficulty hearing?: No Does the patient have difficulty seeing, even when wearing glasses/contacts?: No Does the patient have difficulty concentrating, remembering, or making decisions?: Yes  Permission Sought/Granted Permission sought to share information with : Case Manager, Family Supports, PCP Permission granted to share information with : Yes, Verbal Permission Granted  Share Information with NAME: Bruce Nunez     Permission granted to share info w Relationship: wife  Permission granted to share info w Contact Information: [phone]  Emotional Assessment Appearance:: Appears stated age Attitude/Demeanor/Rapport: Engaged Affect (typically observed): Accepting Orientation: : Oriented to Self      Admission diagnosis:  S/P AVR (aortic valve replacement) [Z95.2] Patient Active Problem List   Diagnosis Date Noted   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   Endotracheally intubated 07/05/2023   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   Stroke (HCC) 03/28/2022   PONV (postoperative nausea and vomiting)    Inguinal hernia    Hypertension    Hyperlipidemia    Hx of transfusion    Hx of peptic ulcer    Hx of nonmelanoma skin cancer    Hx of colon cancer, stage I    History of kidney stones    Hemorrhoids    Heart murmur  GERD (gastroesophageal reflux disease)    Frequency of urination    Diabetes mellitus without complication (HCC)    Complication of anesthesia    Asthma    Anemia    Aortic stenosis 04/29/2019   Aortic regurgitation 03/12/2017   Essential hypertension 03/12/2017   Right inguinal hernia 05/08/2016   S/P right inguinal hernia repair Sept 2015 05/17/2014   Prostate cancer (HCC) 08/15/2012   PCP:  Bruce Height, PA Pharmacy:   [facility] - [place], [place] - [address] [place] [address] Phone: [phone] Fax:  [phone]     Social Determinants of Health (SDOH) Social History: SDOH Screenings   Food Insecurity: No Food Insecurity (07/08/2023)  Housing: Low Risk  (07/08/2023)  Transportation Needs: No Transportation Needs (07/08/2023)  Utilities: Not At Risk (07/08/2023)  Tobacco Use: Low Risk  (07/05/2023)   SDOH Interventions:     Readmission Risk Interventions     No data to display

## 2023-07-09 LAB — GLUCOSE, CAPILLARY
Glucose-Capillary: 112 mg/dL — ABNORMAL HIGH (ref 70–99)
Glucose-Capillary: 122 mg/dL — ABNORMAL HIGH (ref 70–99)
Glucose-Capillary: 138 mg/dL — ABNORMAL HIGH (ref 70–99)

## 2023-07-09 MED ORDER — ENSURE ENLIVE PO LIQD
237.0000 mL | Freq: Three times a day (TID) | ORAL | Status: DC
  Administered 2023-07-09 – 2023-07-16 (×5): 237 mL via ORAL

## 2023-07-09 MED ORDER — GUAIFENESIN ER 600 MG PO TB12
600.0000 mg | ORAL_TABLET | Freq: Two times a day (BID) | ORAL | Status: DC
  Administered 2023-07-09 – 2023-07-10 (×4): 600 mg via ORAL
  Filled 2023-07-09 (×4): qty 1

## 2023-07-09 MED ORDER — METOPROLOL TARTRATE 50 MG PO TABS
50.0000 mg | ORAL_TABLET | Freq: Three times a day (TID) | ORAL | Status: DC
  Administered 2023-07-09 – 2023-07-10 (×6): 50 mg via ORAL
  Filled 2023-07-09 (×6): qty 1

## 2023-07-09 MED ORDER — FUROSEMIDE 40 MG PO TABS
40.0000 mg | ORAL_TABLET | Freq: Every day | ORAL | Status: DC
  Administered 2023-07-09: 40 mg via ORAL
  Filled 2023-07-09: qty 1

## 2023-07-09 MED ORDER — POTASSIUM CHLORIDE CRYS ER 20 MEQ PO TBCR
20.0000 meq | EXTENDED_RELEASE_TABLET | Freq: Every day | ORAL | Status: DC
  Administered 2023-07-09 – 2023-07-18 (×10): 20 meq via ORAL
  Filled 2023-07-09 (×11): qty 1

## 2023-07-09 MED FILL — Nutritional Supplement Liquid: ORAL | Qty: 237 | Status: AC

## 2023-07-09 NOTE — Care Management Important Message (Signed)
Important Message  Patient Details  Name: Bruce Nunez MRN: [id number] Date of Birth: November 11, 1940   Important Message Given:  Yes - Medicare IM     Dorena Bodo 07/09/2023, 1:04 PM

## 2023-07-09 NOTE — Progress Notes (Signed)
Per tele tech pt is NSR with 1st degree heart block but having frequent missed beats and p waves getting progressively longer. Pt asymptomatic. EKG performed. EKG shows SR  with 1st degree heart block , right bundle branch block.  BP: 167/114, HR 102, prn metop given . Rapid RN notified. Will continue to monitor, call bell within reach.

## 2023-07-09 NOTE — Progress Notes (Addendum)
Saw that tele tech had charted 3rd degree heart block on pt, talked with tele tech and reviewed strip with charge RN, floor RN and rapid response RN who determined that it was not a 3rd degree heart block. Will continue to monitor .

## 2023-07-09 NOTE — Discharge Summary (Addendum)
[address]       [place] [address]             [phone]    Physician Discharge Summary  Patient ID: Bruce Nunez MRN: [id number] DOB/AGE: [birth year] 82 y.o.  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   Stroke (HCC) 03/28/2022   PONV (postoperative nausea and vomiting)    Inguinal hernia    Hypertension    Hyperlipidemia    Hx of transfusion    Hx of peptic ulcer    Hx of nonmelanoma skin cancer    Hx of colon cancer, stage I    History of kidney stones    Hemorrhoids    Heart murmur    GERD (gastroesophageal reflux disease)    Frequency of urination    Diabetes mellitus without complication (HCC)    Complication of anesthesia    Asthma    Anemia    Aortic stenosis 04/29/2019   Aortic regurgitation 03/12/2017   Essential hypertension 03/12/2017   Right inguinal hernia 05/08/2016   S/P right inguinal hernia repair Sept 2015 05/17/2014   Prostate cancer (HCC) 08/15/2012     Discharge Diagnoses:  Patient Active Problem List   Diagnosis Date Noted   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   Endotracheally intubated 07/05/2023   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   Stroke (HCC) 03/28/2022   PONV (postoperative nausea and vomiting)    Inguinal hernia    Hypertension    Hyperlipidemia    Hx of transfusion    Hx of peptic ulcer    Hx of nonmelanoma skin cancer    Hx of colon cancer, stage I    History of kidney stones    Hemorrhoids    Heart murmur    GERD (gastroesophageal reflux disease)    Frequency of urination    Diabetes mellitus without complication (HCC)    Complication of anesthesia    Asthma    Anemia    Aortic stenosis 04/29/2019   Aortic regurgitation 03/12/2017   Essential hypertension 03/12/2017    Right inguinal hernia 05/08/2016   S/P right inguinal hernia repair Sept 2015 05/17/2014   Prostate cancer (HCC) 08/15/2012   Discharged Condition: stable History of Present Illness:     Pt is a very pleasant 82 yo male who was told had a leaky aortic valve over 5 years ago and has been followed by Dr Dulce Sellar. Pt has experienced a dramatic decline in functional ability the past 6 months. He was doing very active chores around large home acreage but has declined significantly to where he can't do very much at all. He becomes winded and also describes some lower chest discomfort. All stop when he rests. Pt underwent recent ehco with normal LV function but with moderate to severe AI and mild AS. Pt underwent cath with moderate CAD and LV gram with severe AR. Pt also reports having seen neurology due to concerns of strokes with an inability to control his lower right leg and was negative on CT scans but with evidence of small CVA on MRI. Pt was started on plavix. Pt is very anxious to proceed with AVR and on consideration for TAVR was felt not a candidate due to the lack of calcium  to hold valve.  Dr. Leafy Ro reviewed the patient's diagnostic studies and determined he would benefit from surgical intervention. He reviewed the treatment options as well as the risks and benefits of surgery. Mr. Pomrenke was agreeable to proceed with surgery.  Hospital Course: Mr. Hillie presented to [facility] and was brought to the operating room on 07/05/23. He underwent CABG x 1 utilizing LIMA to LAD and AVR utilizing a 21mm Inspiris Aortic Valve. He tolerated the procedure well and was transferred to the SICU in stable condition.  Vital signs and hemodynamics remained stable.  He was weaned from the mechanical ventilator and extubated by 9 PM on the day of surgery.   By the following morning, he had developed significant agitation and delirium.  He was treated with both Precedex and haloperidol.  Symptoms improved so  that he was more cooperative by midmorning on postop day 2. Vital signs and hemodynamics are stable.  The epicardial pacing wires were removed on postop day 2.  Diuresis was begun with IV Lasix. Chest tubes were removed without complication. He had expected postoperative thrombocytopenia that improved with time. He was felt stable for transfer to the progressive unit. He was restarted on Plavix for his recent stroke. The patient continued to have intermittent confusion requiring seroquel and Haldol PRN as well as a sitter, narcotics were held. He was hypertensive and tachycardic, Lopressor was titrated to 50mg  TID as discussed with Dr. Leafy Ro. PT/OT evaluations recommended SNF at discharge. He remained confused, as discussed with neurology MRI brain was ordered and showed punctate foci of acute infarction in the left temporal occipital junction and right parietal subcortical white matter as well as an acute cortical infarct along the right superior frontal gyrus and additional foci of DWI hypersensitivity along the left pre and postcentral gyri likely reflecting subacute infarcts. No acute hemorrhage was noted. Neurology was consulted and recommended MRA which was unremarkable. Neurology continued to follow closely. SLP was consulted for evaluation and treatment. Lopressor was titrated for hypertension. He developed postoperative rate controlled atrial fibrillation. He is allergic to iodine contrast dye so he was started on Diltiazem instead of Amiodarone. Plavix was transitioned to Eliquis for further stroke prevention.  The patient's delirium improved.  He remained in NSR.  He continued working with PT.  His surgical incisions are free from evidence of infection.  He is stable for SNF discharge today.   Consults:  Cardiology (EP)  Significant Diagnostic Studies:  LEFT HEART CATH AND CORONARY ANGIOGRAPHY     Prox RCA to Mid RCA lesion is 50% stenosed.   Mid LM to Dist LM lesion is 30% stenosed.   Prox Cx  to Dist Cx lesion is 50% stenosed.   Prox LAD to Mid LAD lesion is 50% stenosed.   1st Diag lesion is 60% stenosed.   There is severe (4+) aortic regurgitation.   ECHOCARDIOGRAM REPORT    Patient Name:   KEYNER FINNIN Date of Exam: 07/11/2022  Medical Rec #:  [id number]     Height:       67.0 in  Accession #:    [id number]    Weight:       151.2 lb  Date of Birth:  1941/05/01      BSA:          1.796 m  Patient Age:    81 years      BP:           146/54 mmHg  Patient Gender: M  HR:           63 bpm.  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 Risk Factors:Hypertension and  Dyslipidemia.    Sonographer:   Louie Boston RDCS  Referring Phys: [id number] BRIAN J MUNLEY   IMPRESSIONS   1. GLS -12.9. Left ventricular ejection fraction, by estimation, is 60 to  65%. The left ventricle has normal function. The left ventricle has no  regional wall motion abnormalities. Left ventricular diastolic parameters  are consistent with Grade I  diastolic dysfunction (impaired relaxation).   2. Right ventricular systolic function is normal. The right ventricular  size is normal. There is normal pulmonary artery systolic pressure.   3. The mitral valve is normal in structure. No evidence of mitral valve  regurgitation. No evidence of mitral stenosis.   4. EF still 60-65%, normal LV size. The aortic valve is calcified. There  is moderate calcification of the aortic valve. There is mild thickening of  the aortic valve. Aortic valve regurgitation is moderate to severe. Mild  to moderate aortic valve  stenosis. Aortic valve mean gradient measures 14.0 mmHg.   5. The inferior  vena cava is normal in size with greater than 50%  respiratory variability, suggesting right atrial pressure of 3 mmHg.   FINDINGS   Left Ventricle: GLS -12.9. Left ventricular ejection fraction, by  estimation, is 60 to 65%. The left ventricle has normal function. The left  ventricle has no regional wall motion abnormalities. The left ventricular  internal cavity size was normal in  size. There is no left ventricular hypertrophy. Left ventricular diastolic  parameters are consistent with Grade I diastolic dysfunction (impaired  relaxation).   Right Ventricle: The right ventricular size is normal. No increase in  right ventricular wall thickness. Right ventricular systolic function is  normal. There is normal pulmonary artery systolic pressure. The tricuspid  regurgitant velocity is 2.55 m/s, and   with an assumed right atrial pressure of 3 mmHg, the estimated right  ventricular systolic pressure is 29.0 mmHg.   Left Atrium: Left atrial size was normal in size.   Right Atrium: Right atrial size was normal in size.   Pericardium: There is no evidence of pericardial effusion.   Mitral Valve: The mitral valve is normal in structure. No evidence of  mitral valve regurgitation. No evidence of mitral valve stenosis.   Tricuspid Valve: The tricuspid valve is normal in structure. Tricuspid  valve regurgitation is mild . No evidence of tricuspid stenosis.   Aortic Valve: EF still 60-65%, normal LV size. The aortic valve is  calcified. There is moderate calcification of the aortic valve. There is  mild thickening of the aortic valve. Aortic valve regurgitation is  moderate to severe. Aortic regurgitation PHT  measures 314 msec. Mild to moderate aortic stenosis is present. Aortic  valve mean gradient measures 14.0 mmHg. Aortic valve peak gradient  measures 26.1 mmHg. Aortic valve area, by VTI measures 1.11 cm.   Pulmonic Valve: The pulmonic valve was normal in structure. Pulmonic  valve   regurgitation is not visualized. No evidence of pulmonic stenosis.   Aorta: The aortic root is normal in size and structure.   Venous: The inferior vena cava is normal in size with greater than 50%  respiratory variability, suggesting right atrial pressure of 3 mmHg.   IAS/Shunts: No atrial level shunt detected by color flow Doppler.     LEFT VENTRICLE  PLAX 2D  LVIDd:         4.30 cm   Diastology  LVIDs:         2.50 cm   LV e' medial:    4.03 cm/s  LV PW:         1.00 cm   LV E/e' medial:  19.0  LV IVS:        1.40 cm   LV e' lateral:   7.29 cm/s  LVOT diam:     1.80 cm   LV E/e' lateral: 10.5  LV SV:         58  LV SV Index:   32  LVOT Area:     2.54 cm     RIGHT VENTRICLE             IVC  RV S prime:     10.30 cm/s  IVC diam: 1.20 cm  TAPSE (M-mode): 2.2 cm   LEFT ATRIUM             Index        RIGHT ATRIUM          Index  LA diam:        3.50 cm 1.95 cm/m   RA Area:     9.45 cm  LA Vol (A2C):   48.2 ml 26.84 ml/m  RA Volume:   17.10 ml 9.52 ml/m  LA Vol (A4C):   54.0 ml 30.07 ml/m  LA Biplane Vol: 51.6 ml 28.74 ml/m   AORTIC VALVE  AV Area (Vmax):    0.99 cm  AV Area (Vmean):   1.01 cm  AV Area (VTI):     1.11 cm  AV Vmax:           255.50 cm/s  AV Vmean:          168.500 cm/s  AV VTI:            0.525 m  AV Peak Grad:      26.1 mmHg  AV Mean Grad:      14.0 mmHg  LVOT Vmax:         99.80 cm/s  LVOT Vmean:        66.800 cm/s  LVOT VTI:          0.228 m  LVOT/AV VTI ratio: 0.43  AI PHT:            314 msec    AORTA  Ao Root diam: 2.30 cm  Ao Asc diam:  3.00 cm   MITRAL VALVE               TRICUSPID VALVE  MV Area (PHT): 3.74 cm    TR Peak grad:   26.0 mmHg  MV Decel Time: 203 msec    TR Vmax:        255.00 cm/s  MV E velocity: 76.70 cm/s  MV A velocity: 83.60 cm/s  SHUNTS  MV E/A ratio:  0.92        Systemic VTI:  0.23 m  Systemic Diam: 1.80 cm   Gypsy Balsam MD  Electronically signed by Gypsy Balsam MD   Signature Date/Time: 07/11/2022/12:25:32 PM     Final     Treatments: surgery:  07/05/2023 Jetta Lout [id number]   Surgeon:  Ashley Akin, MD   First Assistant: Aloha Gell [facility]                               An experienced assistant was required given the complexity of this surgery and the standard of surgical care. The assistant was needed for exposure, dissection, suctioning, retraction of delicate tissues and sutures, instrument exchange and for overall help during this procedure.       Preoperative Diagnosis:  Severe Aortic Regurgitation                                               Coronary artery disease   Postoperative Diagnosis:  Same     Procedure: Aortic valve replacement with a 21mm Inspiris Pericardial valve Coronary artery bypass x 1 with the LIMA to the LAD     Discharge Exam: Per Bruce Roddenberry PA-C Blood pressure (!) 142/48, pulse 79, temperature (!) 97.5 F (36.4 C), temperature source Oral, resp. rate 20, height 5\' 7"  (1.702 m), weight 68.6 kg, SpO2 100%.  General appearance: alert, cooperative, and no distress Neurologic: intact Heart: irregular rhythm, appears to be in SR with long first degree AV block and bigeminy.  Lungs: breath sounds full and clear, adequate O2 sats on RA.  Abdomen: soft, flat, no tenderness Extremities: well perfused, no peripheral edema Wound: the sternotomy incision is well approximated and dry.    Discharge Medications:  The patient has been discharged on:   1.Beta Blocker:  Yes [  X ]                              No   [   ]                              If No, reason:  2.Ace Inhibitor/ARB: Yes [   ]                                     No  [  x  ]                                     If No, reason:  Titrating beta blocker and calcium channel blocker  3.Statin:   Yes [  X ]                  No  [   ]                  If No, reason:  4.Ecasa:  Yes  [ X  ]                  No   [   ]  If No,  reason:  Patient had ACS upon admission: No  Plavix/P2Y12 inhibitor: Yes [   ]                                      No  [  x ]     Discharge Instructions     Amb Referral to Cardiac Rehabilitation   Complete by: As directed    Diagnosis:  CABG Valve Replacement     Valve: Aortic   CABG X ___: 1   After initial evaluation and assessments completed: Virtual Based Care may be provided alone or in conjunction with Phase 2 Cardiac Rehab based on patient barriers.: Yes   Intensive Cardiac Rehabilitation (ICR) [facility] location only OR Traditional Cardiac Rehabilitation (TCR) *If criteria for ICR are not met will enroll in TCR [facility] only): Yes      Allergies as of 07/15/2023       Reactions   Penicillins Anaphylaxis, Shortness Of Breath, Itching, Swelling   Throat swells up but no intubation per wife   Iodinated Contrast Media Hives, Rash   Aspirin Other (See Comments)   GI bleeding-avoids   Metformin And Related Diarrhea   Monodox [doxycycline Hyclate] Nausea Only   "made me feel sick"   Sulfa Antibiotics Itching, Swelling   Tramadol Nausea And Vomiting        Medication List     STOP taking these medications    bisoprolol-hydrochlorothiazide 10-6.25 MG tablet Commonly known as: ZIAC   clopidogrel 75 MG tablet Commonly known as: Plavix   isosorbide mononitrate 30 MG 24 hr tablet Commonly known as: IMDUR   nitroGLYCERIN 0.4 MG SL tablet Commonly known as: NITROSTAT       TAKE these medications    albuterol 108 (90 Base) MCG/ACT inhaler Commonly known as: VENTOLIN HFA Inhale 2 puffs into the lungs every 6 (six) hours as needed for shortness of breath.   apixaban 5 MG Tabs tablet Commonly known as: ELIQUIS Take 1 tablet (5 mg total) by mouth 2 (two) times daily.   aspirin 81 MG chewable tablet Chew 1 tablet (81 mg total) by mouth daily.   budesonide-formoterol 160-4.5 MCG/ACT inhaler Commonly known as: SYMBICORT Inhale 2 puffs into the lungs 2 (two) times  daily.   cyanocobalamin 1000 MCG/ML injection Commonly known as: VITAMIN B12 Inject 1 mL into the skin every 30 (thirty) days.   dicyclomine 10 MG capsule Commonly known as: BENTYL Take 10 mg by mouth 2 (two) times daily as needed for cramping (abdominal pain/diarrhea.).   diltiazem 120 MG 24 hr capsule Commonly known as: CARDIZEM CD Take 1 capsule (120 mg total) by mouth daily.   feeding supplement Liqd Take 237 mLs by mouth 3 (three) times daily with meals.   fexofenadine 180 MG tablet Commonly known as: ALLEGRA Take 180 mg by mouth daily.   fluticasone 50 MCG/ACT nasal spray Commonly known as: FLONASE Place 1 spray into both nostrils daily.   furosemide 40 MG tablet Commonly known as: LASIX Take 1 tablet (40 mg total) by mouth daily. X 7 days Start taking on: July 16, 2023 What changed:  medication strength how much to take when to take this reasons to take this additional instructions   glimepiride 2 MG tablet Commonly known as: AMARYL Take 2 mg by mouth in the morning.   levothyroxine 75 MCG tablet Commonly known as: SYNTHROID Take 75 mcg by  mouth daily before breakfast.   metoprolol tartrate 100 MG tablet Commonly known as: LOPRESSOR Take 1 tablet (100 mg total) by mouth 2 (two) times daily.   multivitamin with minerals Tabs tablet Take 1 tablet by mouth daily.   omeprazole 20 MG capsule Commonly known as: PRILOSEC Take 20 mg by mouth in the morning.   potassium chloride SA 20 MEQ tablet Commonly known as: KLOR-CON M Take 1 tablet (20 mEq total) by mouth daily. X 7 days Start taking on: July 16, 2023   rosuvastatin 20 MG tablet Commonly known as: CRESTOR Take 1 tablet (20 mg total) by mouth daily.   solifenacin 10 MG tablet Commonly known as: VESICARE Take 10 mg by mouth in the morning.   tamsulosin 0.4 MG Caps capsule Commonly known as: FLOMAX Take 0.4 mg by mouth 2 (two) times daily.        Contact information for follow-up  providers     [facility] Follow up on 07/17/2023.   Specialty: Cardiothoracic Surgery Why: Follow up appointment is at 12:30PM Contact information: [address] [place], [address] [place] [address] [phone]         [facility] Follow up on 07/17/2023.   Why: To get chest xray at 11:30AM prior to your appointment Contact information: [address] [place] [address]        Flossie Dibble, NP Follow up on 07/29/2023.   Specialty: Cardiology Why: Cardiology appointment is at 2:45PM Contact information: [address] [place] [address] [phone]         Micki Riley, MD. Go on 10/16/2023.   Specialties: Neurology, Radiology Why: stroke clinic, as scheduled Contact information: [address] [place] [address] [phone]         [facility] Dept of [facility] Follow up on 08/16/2023.   Specialty: Cardiology Why: Echocardiogram is at 7:30AM Contact information: [address] [place] [address] [phone]             Contact information for after-discharge care     Destination     [facility] Preferred SNF .   Service: Skilled Nursing Contact information: [address] [place] [address] [phone]                     Signed:  Margaret Pyle in Error patient was not discharged   Lowella Dandy, PA-C  07/15/2023, 11:47 AM

## 2023-07-09 NOTE — Care Management Important Message (Signed)
Important Message  Patient Details  Name: EDISSON PAVLOVICH MRN: [id number] Date of Birth: 1941/06/13   Important Message Given:  Yes - Medicare IM     Sherilyn Banker 07/09/2023, 2:33 PM

## 2023-07-09 NOTE — Evaluation (Addendum)
Occupational Therapy Evaluation Patient Details Name: Bruce Nunez MRN: [id number] DOB: [birth year] Today's Date: 07/09/2023   History of Present Illness Pt is a 82yo male who was admitted for dramatic decline in function due to DOE and fatigue. 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 PMH/PSH: aortic stenosis, portstate ca, GERD, s/p R inguinal hernia repair 9/2-15, HTN, HLD, asthma   Clinical Impression   PTA, pt lives with spouse, typically Independent with ADLs, basic IADLs and mobility w/ intermittent cane use in the community. Pt presents now with acute change in cognition w/ impairments in sternal precaution recall, attention and orientation. Overall, pt requires Mod A to stand and up to Mod A for ADLs w/ consistent cueing for sternal precautions. Family at bedside and hopeful for pt to improve with inpatient rehab closer to their home in [place]. Will continue to follow acutely.        If plan is discharge home, recommend the following: A little help with walking and/or transfers;A lot of help with bathing/dressing/bathroom;Assistance with cooking/housework;Direct supervision/assist for medications management;Direct supervision/assist for financial management;Assist for transportation;Supervision due to cognitive status    Functional Status Assessment  Patient has had a recent decline in their functional status and demonstrates the ability to make significant improvements in function in a reasonable and predictable amount of time.  Equipment Recommendations  BSC/3in1;Other (comment) (RW)    Recommendations for Other Services       Precautions / Restrictions Precautions Precautions: Fall;Sternal Precaution Booklet Issued: Yes (comment) Precaution Comments: pt with impaired cognition and without recall of precautions despite freq verbal cues Restrictions Weight Bearing Restrictions: No     Mobility Bed Mobility                General bed mobility comments: in chair on entry    Transfers Overall transfer level: Needs assistance Equipment used: None Transfers: Sit to/from Stand Sit to Stand: Mod assist           General transfer comment: Mod A to stand from recliner w/ pt consistently attempting to push up with UE. +2 for safety for tactile cues on BUE to hold pillow to stand      Balance Overall balance assessment: Needs assistance Sitting-balance support: Feet supported, No upper extremity supported Sitting balance-Leahy Scale: Fair     Standing balance support: No upper extremity supported, During functional activity, Single extremity supported Standing balance-Leahy Scale: Fair                             ADL either performed or assessed with clinical judgement   ADL Overall ADL's : Needs assistance/impaired Eating/Feeding: Set up   Grooming: Set up;Sitting Grooming Details (indicate cue type and reason): per sitter at bedside, pt able to brush teeth and wash face seated without assist earlier. Upper Body Bathing: Minimal assistance;Sitting   Lower Body Bathing: Moderate assistance;Sitting/lateral leans;Sit to/from stand   Upper Body Dressing : Minimal assistance;Sitting   Lower Body Dressing: Moderate assistance;Sitting/lateral leans;Sit to/from stand       Toileting- Architect and Hygiene: Moderate assistance;Sit to/from stand;Sitting/lateral lean         General ADL Comments: Pt limited by acute new cognitive change post op w/ consistent reminders needed for sternal precautions     Vision Baseline Vision/History: 1 Wears glasses Ability to See in Adequate Light: 0 Adequate Patient Visual Report: No change  from baseline Vision Assessment?: No apparent visual deficits     Perception         Praxis         Pertinent Vitals/Pain Pain Assessment Pain Assessment: No/denies pain     Extremity/Trunk Assessment Upper Extremity Assessment Upper  Extremity Assessment: Overall WFL for tasks assessed;Right hand dominant   Lower Extremity Assessment Lower Extremity Assessment: Defer to PT evaluation   Cervical / Trunk Assessment Cervical / Trunk Assessment: Other exceptions Cervical / Trunk Exceptions: chest incision   Communication Communication Communication: No apparent difficulties Cueing Techniques: Verbal cues;Tactile cues;Gestural cues   Cognition Arousal: Alert Behavior During Therapy: WFL for tasks assessed/performed, Impulsive Overall Cognitive Status: Impaired/Different from baseline Area of Impairment: Orientation, Attention, Memory, Following commands, Safety/judgement, Awareness, Problem solving                 Orientation Level: Disoriented to, Place, Time, Situation Current Attention Level: Sustained, Focused Memory: Decreased recall of precautions, Decreased short-term memory Following Commands: Follows one step commands with increased time Safety/Judgement: Decreased awareness of safety, Decreased awareness of deficits Awareness: Emergent Problem Solving: Difficulty sequencing, Requires verbal cues, Requires tactile cues General Comments: reports x 2 "they're supposed to take me to the hospital in [place]" and reoriented to being in hospital in . multiple reminders needed for sternal precautions with no carryover. pleasant throughout, easily distractible     General Comments  Wife and son at bedside, as well as Recruitment consultant    Exercises     Shoulder Instructions      Home Living Family/patient expects to be discharged to:: Private residence Living Arrangements: Spouse/significant other Available Help at Discharge: Family;Available 24 hours/day Type of Home: House Home Access: Stairs to enter;Other (comment) Entrance Stairs-Number of Steps: 2-3 through garage; has stair lift   Home Layout: One level         Bathroom Toilet: Standard     Home Equipment: Shower seat;Other  (comment);Cane - single point (stair lift)   Additional Comments: pt confused and poor historian. Pt unable to provide PLOF or home set up      Prior Functioning/Environment Prior Level of Function : Independent/Modified Independent             Mobility Comments: cane for mobility in community sometimes ADLs Comments: Indep with ADLs, assists with IADLs. managing meds, etc. wife reports pt sometimes forgetful but current cognition on eval far from his normal        OT Problem List: Decreased strength;Decreased activity tolerance;Impaired balance (sitting and/or standing);Decreased safety awareness;Decreased cognition;Decreased knowledge of use of DME or AE;Decreased knowledge of precautions      OT Treatment/Interventions: Self-care/ADL training;Therapeutic exercise;DME and/or AE instruction;Energy conservation;Therapeutic activities;Patient/family education;Balance training    OT Goals(Current goals can be found in the care plan section) Acute Rehab OT Goals Patient Stated Goal: wife hopeful for pt to have an inpt rehab stay closer to [facility] OT Goal Formulation: With patient/family Time For Goal Achievement: 07/23/23 Potential to Achieve Goals: Good  OT Frequency: Min 1X/week    Co-evaluation              AM-PAC OT "6 Clicks" Daily Activity     Outcome Measure Help from another person eating meals?: A Little Help from another person taking care of personal grooming?: A Little Help from another person toileting, which includes using toliet, bedpan, or urinal?: A Lot Help from another person bathing (including washing, rinsing, drying)?: A Lot Help from another person to put on and  taking off regular upper body clothing?: A Little Help from another person to put on and taking off regular lower body clothing?: A Lot 6 Click Score: 15   End of Session    Activity Tolerance: Patient tolerated treatment well Patient left: in chair;with call bell/phone within reach;with  family/visitor present;Other (comment) (safety sitter at bedside)  OT Visit Diagnosis: Unsteadiness on feet (R26.81);Other abnormalities of gait and mobility (R26.89);Other symptoms and signs involving cognitive function                Time: 1610-9604 OT Time Calculation (min): 25 min Charges:  OT General Charges $OT Visit: 1 Visit OT Evaluation $OT Eval Moderate Complexity: 1 Mod OT Treatments $Self Care/Home Management : 8-22 mins  Bradd Canary, OTR/L Acute Rehab Services Office: [phone]   Lorre Munroe 07/09/2023, 12:25 PM

## 2023-07-09 NOTE — Progress Notes (Signed)
CARDIAC REHAB PHASE I    PT/OT to evaluate mobility. Pt continues to have on and off confusion. Will assist with mobility needs after PT/OT assessment and provide post op OHS education when appropriate. Will continue to follow.    Woodroe Chen, RN BSN 07/09/2023 10:39 AM

## 2023-07-09 NOTE — Progress Notes (Addendum)
[address]       [place] [address]             [phone]      4 Days Post-Op Procedure(s) (LRB): AORTIC VALVE REPLACEMENT WITH INSPIRIS AORTIC VALVE (N/A) CORONARY ARTERY BYPASS GRAFTING, USING LEFT INTERNAL MAMMARY ARTERY (N/A) TRANSESOPHAGEAL ECHOCARDIOGRAM (N/A) Subjective: The patient denies pain and shortness of breath this morning. He is alert to person and time but believes he is in [place] for a cook off.   Objective: Vital signs in last 24 hours: Temp:  [97.6 F (36.4 C)-98.8 F (37.1 C)] 98.5 F (36.9 C) (11/12 0548) Pulse Rate:  [81-110] 94 (11/12 0428) Cardiac Rhythm: Heart block (11/12 0245) Resp:  [16-25] 18 (11/12 0428) BP: (91-167)/(54-132) 113/54 (11/12 0428) SpO2:  [88 %-100 %] 97 % (11/12 0428) Weight:  [70 kg] 70 kg (11/12 0327)  Hemodynamic parameters for last 24 hours:    Intake/Output from previous day: 11/11 0701 - 11/12 0700 In: 50 [P.O.:50] Out: 1475 [Urine:1475] Intake/Output this shift: No intake/output data recorded.  General appearance: alert, cooperative, delirious, and no distress Neurologic: Alert and oriented x2 to person and time, not oriented to place. Otherwise grossly intact Heart: regular rate and rhythm-sinus tachycardia, no murmur Lungs: Coarse breath sounds Abdomen: soft, non-tender; bowel sounds normal; no masses,  no organomegaly Extremities: extremities normal, atraumatic, no cyanosis or edema Wound: Clean and dry without sign of infection  Lab Results: Recent Labs    07/07/23 0323 07/08/23 0335  WBC 8.0 8.7  HGB 9.0* 9.2*  HCT 27.7* 28.6*  PLT 67* 92*   BMET:  Recent Labs    07/07/23 0323 07/08/23 0335  NA 131* 131*  K 4.5 4.0  CL 104 101  CO2 22 23  GLUCOSE 126* 117*  BUN 16 18  CREATININE 1.17 0.98  CALCIUM 8.0* 8.1*    PT/INR: No results for input(s): "LABPROT", "INR" in the last 72 hours. ABG    Component Value Date/Time   PHART 7.363 07/05/2023 2208    HCO3 21.2 07/05/2023 2208   TCO2 22 07/05/2023 2208   ACIDBASEDEF 4.0 (H) 07/05/2023 2208   O2SAT 90 07/05/2023 2208   CBG (last 3)  Recent Labs    07/08/23 1623 07/08/23 2110 07/09/23 0609  GLUCAP 127* 111* 112*    Assessment/Plan: S/P Procedure(s) (LRB): AORTIC VALVE REPLACEMENT WITH INSPIRIS AORTIC VALVE (N/A) CORONARY ARTERY BYPASS GRAFTING, USING LEFT INTERNAL MAMMARY ARTERY (N/A) TRANSESOPHAGEAL ECHOCARDIOGRAM (N/A)  Neuro: Patient alert and oriented to person and time but not place. Patient with intermittent periods of confusion and agitation. On Seroquel 25mg  at night for sundowning and Haldol PRN. Received 1 dose of Haldol last night. Has a sitter this AM. Will monitor today and hold narcotics since pain is controlled. May need scan if delirium worsens or does not improve, will hold off for today as discussed with Dr. Leafy Ro. Hx of CVA, on Plavix.   CV: On Lopressor 50mg  BID. Labile BP, SBP 113-167. NSR-ST, HR 90-105. Some missed beats, first degree heart block. On Lopressor 50mg  BID. As discussed with Dr. Leafy Ro will increase Lopressor to 50mg  TID.   Pulm: Saturating well on RA. Cough this AM and coarse breath sounds, will add Lasix, Mucinex and flutter valve. Continue nebs. Patient does not have an IS in the room, encourage IS and ambulation.  GI: Not eating much, denies nausea and vomiting. Will start ensure shakes.   Endo: A1C 6.1, no home meds.  Likely prediabetic, will need close outpatient follow up. CBGs controlled on SSI PRN will continue until the patient is eating more. Hypothyroid, on Levothyroxine   Renal: Cr 0.98, AKI resolved. +8lbs, will give PO Lasix 40mg  and supplement K. On home Flomax  Expected postop ABLA: Last H/H 9.2/28.6, will monitor. Not clinically significant at this time  Expected thrombocytopenia: Last plt 92,000, will monitor  DVT Prophylaxis: Lovenox  Deconditioning: Will likely need CIR or SNF, will get PT/OT consults.   Dispo:  Dispo planning   LOS: 4 days    Jenny Reichmann, PA-C 07/09/2023

## 2023-07-09 NOTE — Evaluation (Signed)
Physical Therapy Evaluation Patient Details Name: Bruce Nunez MRN: [id number] DOB: 07-02-1941 Today's Date: 07/09/2023  History of Present Illness  Pt is a 82yo male who was admitted for dramatic decline in function over the last 6 months due to DOE and fatigue. 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 PMH/PSH: aortic stenosis, portstate ca, GERD, s/p R inguinal hernia repair 9/2-15, HTN, HLD, asthma   Clinical Impression  Pt admitted with above. Per chart, pt was indep and working outside on his large property until about 6 months ago when he took a dramatic decline. Pt confused, thinks he is at a cook out, repeatedly asks for Onalee Hua, and when he does recall being in the hospital thinks it's because "Onalee Hua" is sick. Unsure of available assist at home or home set up as pt poor historian and family not present in room. At this time recommending inpatient rehab program < 3 hrs/day to address both cognitive and functional deficits to ultimately achieve supervision level of function for safe transition home with spouse. Acute PT to cont to work with patient and reassess d/c recommendations.        If plan is discharge home, recommend the following: A lot of help with walking and/or transfers;A lot of help with bathing/dressing/bathroom;Supervision due to cognitive status;Help with stairs or ramp for entrance;Direct supervision/assist for medications management;Direct supervision/assist for financial management;Assist for transportation   Can travel by private vehicle   No    Equipment Recommendations  (TBD at next venue)  Recommendations for Other Services       Functional Status Assessment Patient has had a recent decline in their functional status and demonstrates the ability to make significant improvements in function in a reasonable and predictable amount of time.     Precautions / Restrictions Precautions Precautions:  Fall;Sternal Precaution Booklet Issued: No Precaution Comments: pt with impaired cognition and without recall of precautions despite freq verbal cues Restrictions Weight Bearing Restrictions: Yes RUE Weight Bearing: Non weight bearing LUE Weight Bearing: Non weight bearing      Mobility  Bed Mobility Overal bed mobility: Needs Assistance Bed Mobility: Rolling, Sidelying to Sit Rolling: Mod assist Sidelying to sit: Max assist       General bed mobility comments: max directional verbal and tactile cues needed to complete task, pt initiated LE movement towards EOB with max cues    Transfers Overall transfer level: Needs assistance Equipment used: None Transfers: Sit to/from Stand Sit to Stand: Min assist           General transfer comment: max tactile cues to rock forward and hold onto pillow, minA to power up into standing, wide base of support    Ambulation/Gait Ambulation/Gait assistance: Mod assist Gait Distance (Feet): 60 Feet Assistive device: Rolling walker (2 wheels) Gait Pattern/deviations: Step-through pattern, Decreased stride length, Shuffle Gait velocity: dec Gait velocity interpretation: <1.8 ft/sec, indicate of risk for recurrent falls   General Gait Details: pt with increased trunk flexion, modA for walker management, difficulty clearing R foot more than L foot, progressive instability with onset of fatigue and DOE requiring modA compared to initial English as a second language teacher     Tilt Bed    Modified Rankin (Stroke Patients Only)       Balance Overall balance assessment: Needs assistance Sitting-balance support: Feet supported, No upper extremity supported Sitting balance-Leahy Scale: Fair  Sitting balance - Comments: pt initially contact guard however withonset of fatigue pt with L lateral lean   Standing balance support: Bilateral upper extremity supported Standing balance-Leahy Scale: Poor Standing balance comment:  dep on RW for safe ambulation                             Pertinent Vitals/Pain Pain Assessment Pain Assessment: No/denies pain    Home Living Family/patient expects to be discharged to:: Private residence Living Arrangements: Spouse/significant other Available Help at Discharge: Family               Additional Comments: pt confused and poor historian. Pt unable to provide PLOF or home set up    Prior Function Prior Level of Function : Independent/Modified Independent             Mobility Comments: pt was indep and doing outdoor chores on his land until 6 months ago when he did a drastic decline in function ADLs Comments: indep until 6 months ago     Extremity/Trunk Assessment   Upper Extremity Assessment Upper Extremity Assessment: Generalized weakness;Difficult to assess due to impaired cognition    Lower Extremity Assessment Lower Extremity Assessment: Generalized weakness;Difficult to assess due to impaired cognition    Cervical / Trunk Assessment Cervical / Trunk Assessment: Other exceptions Cervical / Trunk Exceptions: chest incision  Communication   Communication Communication: No apparent difficulties Cueing Techniques: Verbal cues;Tactile cues  Cognition Arousal: Alert Behavior During Therapy: WFL for tasks assessed/performed, Impulsive Overall Cognitive Status: Impaired/Different from baseline Area of Impairment: Orientation, Attention, Memory, Following commands, Safety/judgement, Awareness, Problem solving                 Orientation Level: Disoriented to, Place, Time, Situation Current Attention Level: Focused Memory: Decreased recall of precautions, Decreased short-term memory Following Commands: Follows one step commands with increased time Safety/Judgement: Decreased awareness of safety, Decreased awareness of deficits Awareness: Emergent Problem Solving: Difficulty sequencing, Requires verbal cues, Requires tactile  cues General Comments: pt pleasant but confused. Repeatedly asking "How's Onalee Hua doing? Does Onalee Hua live here?"  Pt reoriented to [facility] hospital several times however pt only able to recall < 50% of time. When asked why he was in the hospital pt responded "Onalee Hua has been sick." pt reoriented to having heart surgery frequently. pt poor short term memory/recall        General Comments General comments (skin integrity, edema, etc.): VSS, chest incision without drainage    Exercises     Assessment/Plan    PT Assessment Patient needs continued PT services  PT Problem List Decreased strength;Decreased range of motion;Decreased activity tolerance;Decreased balance;Decreased mobility;Decreased knowledge of use of DME;Decreased safety awareness;Decreased coordination;Decreased cognition;Cardiopulmonary status limiting activity       PT Treatment Interventions DME instruction;Gait training;Stair training;Functional mobility training;Therapeutic activities;Therapeutic exercise;Balance training;Neuromuscular re-education    PT Goals (Current goals can be found in the Care Plan section)  Acute Rehab PT Goals Patient Stated Goal: didn't state PT Goal Formulation: Patient unable to participate in goal setting Time For Goal Achievement: 07/23/23 Potential to Achieve Goals: Good    Frequency Min 1X/week     Co-evaluation               AM-PAC PT "6 Clicks" Mobility  Outcome Measure Help needed turning from your back to your side while in a flat bed without using bedrails?: A Lot Help needed moving from lying on your back to sitting  on the side of a flat bed without using bedrails?: A Lot Help needed moving to and from a bed to a chair (including a wheelchair)?: A Lot Help needed standing up from a chair using your arms (e.g., wheelchair or bedside chair)?: A Lot Help needed to walk in hospital room?: A Lot Help needed climbing 3-5 steps with a railing? : A Lot 6 Click Score: 12    End of  Session Equipment Utilized During Treatment: Gait belt Activity Tolerance: Patient tolerated treatment well Patient left: in chair;with call bell/phone within reach;with nursing/sitter in room Nurse Communication: Mobility status PT Visit Diagnosis: Unsteadiness on feet (R26.81);Muscle weakness (generalized) (M62.81);Difficulty in walking, not elsewhere classified (R26.2)    Time: 6962-9528 PT Time Calculation (min) (ACUTE ONLY): 31 min   Charges:   PT Evaluation $PT Eval Moderate Complexity: 1 Mod PT Treatments $Gait Training: 8-22 mins PT General Charges $$ ACUTE PT VISIT: 1 Visit          Lewis Shock, PT, DPT Acute Rehabilitation Services Secure chat preferred Office #: [phone]   Iona Hansen 07/09/2023, 10:42 AM

## 2023-07-10 ENCOUNTER — Inpatient Hospital Stay (HOSPITAL_COMMUNITY): Payer: Medicare Other

## 2023-07-10 DIAGNOSIS — I639 Cerebral infarction, unspecified: Secondary | ICD-10-CM | POA: Diagnosis not present

## 2023-07-10 DIAGNOSIS — Z952 Presence of prosthetic heart valve: Secondary | ICD-10-CM | POA: Diagnosis not present

## 2023-07-10 LAB — BASIC METABOLIC PANEL
Anion gap: 8 (ref 5–15)
BUN: 23 mg/dL (ref 8–23)
CO2: 25 mmol/L (ref 22–32)
Calcium: 8.5 mg/dL — ABNORMAL LOW (ref 8.9–10.3)
Chloride: 102 mmol/L (ref 98–111)
Creatinine, Ser: 1.21 mg/dL (ref 0.61–1.24)
GFR, Estimated: 59 mL/min — ABNORMAL LOW (ref >60)
Glucose, Bld: 114 mg/dL — ABNORMAL HIGH (ref 70–99)
Potassium: 3.6 mmol/L (ref 3.5–5.1)
Sodium: 135 mmol/L (ref 135–145)

## 2023-07-10 LAB — CBC
HCT: 29.8 % — ABNORMAL LOW (ref 39.0–52.0)
Hemoglobin: 9.7 g/dL — ABNORMAL LOW (ref 13.0–17.0)
MCH: 27.6 pg (ref 26.0–34.0)
MCHC: 32.6 g/dL (ref 30.0–36.0)
MCV: 84.7 fL (ref 80.0–100.0)
Platelets: 143 10*3/uL — ABNORMAL LOW (ref 150–400)
RBC: 3.52 MIL/uL — ABNORMAL LOW (ref 4.22–5.81)
RDW: 14.1 % (ref 11.5–15.5)
WBC: 6.5 10*3/uL (ref 4.0–10.5)
nRBC: 0 % (ref 0.0–0.2)

## 2023-07-10 LAB — GLUCOSE, CAPILLARY
Glucose-Capillary: 109 mg/dL — ABNORMAL HIGH (ref 70–99)
Glucose-Capillary: 119 mg/dL — ABNORMAL HIGH (ref 70–99)
Glucose-Capillary: 138 mg/dL — ABNORMAL HIGH (ref 70–99)
Glucose-Capillary: 132 mg/dL — ABNORMAL HIGH (ref 70–99)

## 2023-07-10 MED ORDER — LACTULOSE 10 GM/15ML PO SOLN: 20.0000 g | Freq: Every day | ORAL | Status: DC | PRN

## 2023-07-10 MED ORDER — POTASSIUM CHLORIDE CRYS ER 20 MEQ PO TBCR
40.0000 meq | EXTENDED_RELEASE_TABLET | Freq: Once | ORAL | Status: AC
  Administered 2023-07-10: 40 meq via ORAL
  Filled 2023-07-10: qty 2

## 2023-07-10 NOTE — Progress Notes (Signed)
Mobility Specialist Progress Note:   07/10/23 1110  Mobility  Activity Ambulated with assistance in hallway  Level of Assistance Minimal assist, patient does 75% or more  Assistive Device Front wheel walker  Distance Ambulated (ft) 125 ft  RUE Weight Bearing NWB  LUE Weight Bearing NWB  Activity Response Tolerated well  Mobility Referral Yes  $Mobility charge 1 Mobility  Mobility Specialist Start Time (ACUTE ONLY) 1037  Mobility Specialist Stop Time (ACUTE ONLY) 1100  Mobility Specialist Time Calculation (min) (ACUTE ONLY) 23 min   Pre Mobility: 92 HR , 98% SpO2 RA During Mobility: 103 HR  Post Mobility: 76 HR   Pt received in bed, agreeable to mobility. Still slightly confused, needing verbal cues to redirect pt  to get to EOB. MinA for bed mobility. Pt unable to recall sternal precautions. Upon standing pt reaching out towards RW after reminding pt to stand with hands on knees. Pt denied any discomfort during ambulation, asx throughout. Pt left in chair with sitter and wife present in room.   [name]  Mobility Specialist Please contact via [employer] office at [phone]

## 2023-07-10 NOTE — Progress Notes (Signed)
CARDIAC REHAB PHASE I   Stopped by to offer walk, pt working OT. Ambulated once today with mobility team. Will return later today to offer walk, as time allows. Work up for discharge to SNF ongoing. Will continue to follow.   [phone] Woodroe Chen, RN BSN 07/10/2023 12:09 PM

## 2023-07-10 NOTE — TOC Progression Note (Signed)
Transition of Care [facility]) - Progression Note    Patient Details  Name: Bruce Nunez MRN: [id number] Date of Birth: 1941-01-19  [facility] of [facility]) CM/SW Contact  Eduard Roux, [place] Phone Number: 07/10/2023, 4:30 PM  Clinical Narrative:     Received call back form patient's spouse- preferred SNF is [facility] in [place] or [facility]. CSW sent message to [facility] to review. -waiting on response   Patient will need to be w/out sitter for more than 48 hrs before he can d/c to SNF once stable.   TOC will continue to follow and assist with discharge planning.  Antony Blackbird, MSW, LCSW Clinical Social Worker    Expected Discharge Plan: Home w Home Health Services Barriers to Discharge: Continued Medical Work up  Expected Discharge Plan and Services   Discharge Planning Services: CM Consult Post Acute Care Choice: IP Rehab Living arrangements for the past 2 months: Single Family Home                                       Social Determinants of Health (SDOH) Interventions SDOH Screenings   Food Insecurity: No Food Insecurity (07/08/2023)  Housing: Low Risk  (07/08/2023)  Transportation Needs: No Transportation Needs (07/08/2023)  Utilities: Not At Risk (07/08/2023)  Tobacco Use: Low Risk  (07/05/2023)    Readmission Risk Interventions     No data to display

## 2023-07-10 NOTE — Consult Note (Signed)
NEUROLOGY CONSULT NOTE   Date of service: July 10, 2023 Patient Name: Bruce Nunez MRN:  [id number] DOB:  May 13, 1941 Chief Complaint: "stroke" Requesting Provider: [name], MD  History of Present Illness  Bruce Nunez is an 82 y.o. 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 Neurology was consulted this am due to increased confusion and acute strokes seen on MRI.   MRI brain was ordered that shows punctate foci of acute infarction in the left temporal occipital junction and right parietal subcortical white matter, an acute cortical infarct along the right superior frontal gyrus and additional foci of DWI hyperintensity along the left pre and postcentral gyri, likely reflecting subacute infarcts.   The patient sees Dr. Pearlean Brownie in outpatient follow-up for his previous strokes.   On exam, patient is sitting up in chair. He is oriented to self, place, month, situation. Follows commands. Dysarthria is present, no aphasia. Does have some paraphasic speech and repetition when telling a story. Has some weakness noted BLE, but no focal or one-sided weakness.  He has had prior GI bleeding on ASA.   NIHSS:  1a Level of Conscious.: 0 1b LOC Questions: 0 1c LOC Commands: 0 2 Best Gaze: 0 3 Visual: 0 4 Facial Palsy: 0 5a Motor Arm - left: 0 5b Motor Arm - Right:0  6a Motor Leg - Left: 1 6b Motor Leg - Right: 1 7 Limb Ataxia: 0 8 Sensory: 0 9 Best Language: 0 10 Dysarthria: 1 11 Extinct. and Inatten.: 0 TOTAL: 3     ROS  Comprehensive ROS performed and pertinent positives documented in HPI    Past History   Past Medical History:  Diagnosis Date   Anemia    recent iron supplement   Aortic regurgitation 03/12/2017   Aortic stenosis 04/29/2019   Asthma    childhood-   2013 bronchitis  w/wheezing   Complication of anesthesia    " ether made me sick" - "woke up during colonoscopy"   Diabetes mellitus without complication (HCC)    Essential hypertension 03/12/2017   Frequency of urination    GERD (gastroesophageal reflux disease)    occasional   Heart murmur    Hemorrhoids    History of kidney stones    Hx of colon cancer, stage I    Hx of nonmelanoma skin cancer    Hx of peptic ulcer    as teenager   Hx of transfusion    Hyperlipidemia    Hypertension    Inguinal hernia    PONV (postoperative nausea and vomiting)    only when had Ether as Anesthesia drug   Prostate cancer (HCC) 08/15/2012   also Hx colon cancer / Hx skin cancer   Right inguinal hernia 05/08/2016   S/P right inguinal hernia repair Sept 2015 05/17/2014   Recurrent right indirect  Hernia treated with robotic TAPP repair with enterolysis Sept 2017   Stroke [facility]) 03/28/2022    Past Surgical History:  Procedure Laterality Date   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   Procedure: AORTIC VALVE REPLACEMENT WITH INSPIRIS AORTIC VALVE;  Surgeon: [name], MD;  Location: [facility] OR;  Service: Open Heart Surgery;  Laterality: N/A;   cataract surgery  2009   CORONARY ARTERY BYPASS GRAFT N/A 07/05/2023   Procedure: CORONARY  ARTERY BYPASS GRAFTING, USING LEFT INTERNAL MAMMARY ARTERY;  Surgeon: [name], MD;  Location: [facility] OR;  Service: Open Heart Surgery;  Laterality: N/A;   CYSTOSCOPY     INGUINAL HERNIA REPAIR Right 05/17/2014   Procedure: RIGHT INGUINAL HERNIA REPAIR ;  Surgeon: Wenda Low, MD;  Location: [facility] ORS;  Service: General;  Laterality: Right;  With MESH   KNEE SURGERY Right 2012   LEFT HEART CATH AND CORONARY ANGIOGRAPHY N/A 05/03/2023   Procedure: LEFT HEART CATH AND CORONARY ANGIOGRAPHY;  Surgeon: Tonny Bollman, MD;  Location: [facility] INVASIVE CV LAB;  Service: Cardiovascular;  Laterality: N/A;   PARTIAL COLECTOMY  2005   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   RADIOACTIVE SEED  IMPLANT N/A 11/24/2012   Procedure: RADIOACTIVE SEED IMPLANT;  Surgeon: Valetta Fuller, MD;  Location: [facility];  Service: Urology;  Laterality: N/A;   71seeds implanted  no seeds found in bladder   TEE WITHOUT CARDIOVERSION N/A 07/05/2023   Procedure: TRANSESOPHAGEAL ECHOCARDIOGRAM;  Surgeon: [name], MD;  Location: [facility] OR;  Service: Open Heart Surgery;  Laterality: N/A;   THROAT SURGERY  1940's   as child (age 75)growth that created fissure/corrective surgery   TONSILLECTOMY     age 44   XI ROBOTIC ASSISTED INGUINAL HERNIA REPAIR WITH MESH Right 05/08/2016   Procedure: XI ROBOTIC ASSISTED REPAIR OF RECURRENT RIGHT INGUINAL HERNIA;  Surgeon: Luretha Murphy, MD;  Location: [facility] ORS;  Service: General;  Laterality: Right;  With MESH    Family History: Family History  Problem Relation Age of Onset   Heart attack Other    Brain cancer Other    Coronary artery disease Other    Prostate cancer Other     Social History  reports that he has never smoked. He has never been exposed to tobacco smoke. He has never used smokeless tobacco. He reports that he does not drink alcohol and does not use drugs.  Allergies  Allergen Reactions   Penicillins Anaphylaxis, Shortness Of Breath, Itching and Swelling    Throat swells up but no intubation per wife   Iodinated Contrast Media Hives and Rash   Aspirin Other (See Comments)    GI bleeding-avoids   Metformin And Related Diarrhea   Monodox [Doxycycline Hyclate] Nausea Only    "made me feel sick"   Sulfa Antibiotics Itching and Swelling   Tramadol Nausea And Vomiting    Medications   Current Facility-Administered Medications:    acetaminophen (TYLENOL) tablet 1,000 mg, 1,000 mg, Oral, Q6H, 1,000 mg at 07/10/23 1349 **OR** acetaminophen (TYLENOL) 160 MG/5ML solution 1,000 mg, 1,000 mg, Per Tube, Q6H, Stehler, Bailey C, PA-C, 1,000 mg at 07/07/23 1500   albuterol (PROVENTIL) (2.5 MG/3ML) 0.083% nebulizer solution 2.5 mg, 2.5 mg,  Nebulization, Q6H PRN, [name], MD   arformoterol (BROVANA) nebulizer solution 15 mcg, 15 mcg, Nebulization, BID, [name], MD, 15 mcg at 07/09/23 1950   aspirin chewable tablet 81 mg, 81 mg, Oral, Daily, [name], MD, 81 mg at 07/10/23 0910   bisacodyl (DULCOLAX) EC tablet 10 mg, 10 mg, Oral, Daily, 10 mg at 07/10/23 1349 **OR** bisacodyl (DULCOLAX) suppository 10 mg, 10 mg, Rectal, Daily, Stehler, Bailey C, PA-C   budesonide (PULMICORT) nebulizer solution 0.25 mg, 0.25 mg, Nebulization, BID, [name], MD, 0.25 mg at 07/09/23 1950   clopidogrel (PLAVIX) tablet 75 mg, 75 mg, Oral, Daily, [name], MD, 75 mg at 07/10/23 0911   dextrose 50 % solution 0-50 mL, 0-50 mL,  Intravenous, PRN, Ronney Lion, Oren Bracket, PA-C   dicyclomine (BENTYL) capsule 10 mg, 10 mg, Oral, BID PRN, Stehler, Oren Bracket, PA-C   docusate sodium (COLACE) capsule 200 mg, 200 mg, Oral, Daily, Stehler, Bailey C, PA-C, 200 mg at 07/10/23 1349   enoxaparin (LOVENOX) injection 40 mg, 40 mg, Subcutaneous, QHS, Lightfoot, Harrell O, MD, 40 mg at 07/09/23 2026   feeding supplement (ENSURE ENLIVE / ENSURE PLUS) liquid 237 mL, 237 mL, Oral, TID WC, Stehler, Bailey C, PA-C, 237 mL at 07/09/23 0750   fesoterodine (TOVIAZ) tablet 4 mg, 4 mg, Oral, Daily, Stehler, Oren Bracket, PA-C, 4 mg at 07/10/23 0911   fluticasone (FLONASE) 50 MCG/ACT nasal spray 1 spray, 1 spray, Each Nare, Daily, Stehler, Oren Bracket, PA-C, 1 spray at 07/10/23 0911   guaiFENesin (MUCINEX) 12 hr tablet 600 mg, 600 mg, Oral, BID, Stehler, Bailey C, PA-C, 600 mg at 07/10/23 0911   hydrALAZINE (APRESOLINE) injection 10 mg, 10 mg, Intravenous, Q6H PRN, Lightfoot, Harrell O, MD, 10 mg at 07/06/23 1204   CBG monitoring, , , 4x Daily, AC & HS **AND** insulin aspart (novoLOG) injection 0-24 Units, 0-24 Units, Subcutaneous, TID AC, [name], MD, 2 Units at 07/09/23 1655   lactulose (CHRONULAC) 10 GM/15ML solution 20 g, 20 g, Oral, Daily PRN, Stehler, Oren Bracket,  PA-C   levothyroxine (SYNTHROID) tablet 75 mcg, 75 mcg, Oral, Q0600, Jenny Reichmann, PA-C, 75 mcg at 07/10/23 0649   metoprolol tartrate (LOPRESSOR) injection 2.5-5 mg, 2.5-5 mg, Intravenous, Q2H PRN, Stehler, Oren Bracket, PA-C, 5 mg at 07/09/23 0321   metoprolol tartrate (LOPRESSOR) tablet 50 mg, 50 mg, Oral, TID, 50 mg at 07/10/23 0911 **OR** [DISCONTINUED] metoprolol tartrate (LOPRESSOR) 25 mg/10 mL oral suspension 12.5 mg, 12.5 mg, Per Tube, BID, Stehler, Bailey C, PA-C   ondansetron (ZOFRAN) injection 4 mg, 4 mg, Intravenous, Q6H PRN, Stehler, Oren Bracket, PA-C   Oral care mouth rinse, 15 mL, Mouth Rinse, PRN, [name], MD   pantoprazole (PROTONIX) EC tablet 40 mg, 40 mg, Oral, Daily, Stehler, Oren Bracket, PA-C, 40 mg at 07/10/23 1610   potassium chloride SA (KLOR-CON M) CR tablet 20 mEq, 20 mEq, Oral, Daily, Stehler, Oren Bracket, PA-C, 20 mEq at 07/10/23 0911   QUEtiapine (SEROQUEL) tablet 25 mg, 25 mg, Oral, QHS, [name], MD, 25 mg at 07/09/23 2026   rosuvastatin (CRESTOR) tablet 20 mg, 20 mg, Oral, Daily, Stehler, Bailey C, PA-C, 20 mg at 07/10/23 0911   sodium chloride flush (NS) 0.9 % injection 3 mL, 3 mL, Intravenous, PRN, Stehler, Oren Bracket, PA-C   tamsulosin [facility]) capsule 0.4 mg, 0.4 mg, Oral, BID, Stehler, Bailey C, PA-C, 0.4 mg at 07/10/23 0912  Vitals   Vitals:   07/10/23 0000 07/10/23 0314 07/10/23 0500 07/10/23 0900  BP:  (!) 133/57  138/64  Pulse: 85 90  99  Resp: 18 18  18   Temp:  98.4 F (36.9 C)  98.4 F (36.9 C)  TempSrc:  Oral  Oral  SpO2: 97% 94%  95%  Weight:   69.8 kg   Height:        Body mass index is 24.1 kg/m.  Physical Exam   Constitutional: Appears well-developed and well-nourished.  Psych: Affect appropriate to situation.  Eyes: No scleral injection.  HENT: No OP obstruction.  Head: Normocephalic.  Cardiovascular: 1st degree heart block on monitor Respiratory: Effort normal, non-labored breathing.  GI: Soft.  No distension. There is no  tenderness.  Skin: scar on chest from recent surgery,  bruising on LUE.   Neurologic Examination   Neuro: Mental Status: Patient is awake, alert, oriented to person, place, month, year, and situation. Patient is able to give a clear and coherent history with repetition and some paraphasic errors. Dysarthria present. No signs of aphasia or neglect. Cranial Nerves: II: Visual Fields are full. Pupils are equal, round, and reactive to light.   III,IV, VI: EOMI without ptosis or diplopia.  V: Facial sensation is symmetric to temperature VII: Facial movement is symmetric.  VIII: hearing is intact to voice X: Uvula elevates symmetrically XI: Shoulder shrug is symmetric. XII: tongue is midline without atrophy or fasciculations.  Motor: Tone is normal. Bulk is normal.  4+/5 BUE 4-/5 BLE Sensory: Sensation is symmetric to light touch in the arms and legs. Cerebellar: FNF and HKS are slow but intact bilaterally Gait: Steady with use of walker.    Labs/Imaging/Neurodiagnostic studies   CBC:  Recent Labs  Lab 2023/07/27 1903 07-27-2023 2028 07/08/23 0335 07/10/23 0354  WBC 9.4   < > 8.7 6.5  NEUTROABS 6.9  --   --   --   HGB 9.9*   < > 9.2* 9.7*  HCT 29.2*   < > 28.6* 29.8*  MCV 83.2   < > 85.6 84.7  PLT 87*   < > 92* 143*   < > = values in this interval not displayed.    Basic Metabolic Panel:  Lab Results  Component Value Date   NA 135 07/10/2023   K 3.6 07/10/2023   CO2 25 07/10/2023   GLUCOSE 114 (H) 07/10/2023   BUN 23 07/10/2023   CREATININE 1.21 07/10/2023   CALCIUM 8.5 (L) 07/10/2023   GFRNONAA 59 (L) 07/10/2023   GFRAA 79 12/09/2019    Lipid Panel:  Lab Results  Component Value Date   LDLCALC 39 03/21/2023    HgbA1c:  Lab Results  Component Value Date   HGBA1C 6.1 (H) 07/03/2023    Urine Drug Screen: No results found for: "LABOPIA", "COCAINSCRNUR", "LABBENZ", "AMPHETMU", "THCU", "LABBARB"   Alcohol Level No results found for: "ETH"  INR  Lab  Results  Component Value Date   INR 1.8 (H) 27-Jul-2023    APTT  Lab Results  Component Value Date   APTT 57 (H) 07-27-2023     MRI Brain(Personally reviewed): 1.Punctate foci of acute infarction in the left temporal occipital junction and right parietal subcortical white matter. 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 No acute hemorrhage or significant mass effect. 2. Additional foci of DWI hyperintensity along the left pre and postcentral gyri, likely reflecting subacute infarcts.  TEE 07-27-2023:  LVEF 60-65%, Grade I diastolic dysfunction, No shunt.   Impression  Bruce Nunez is a 82 y.o. male with PMH significant for HTN, HLD, stroke summer 2023 with residual  R foot drop, on Plavix at home, AVR, AVS, DM, Colon Cancer, Prostate Cancer who is s/p AVR w/ CABG and LIMA to LAD 2023/07/27. Neurology was consulted due to increased confusion and acute infarcts on MRI. - On exam, patient is sitting up in chair. He is oriented to self, place, month, situation. Follows commands. Dysarthria is present, no aphasia. Does have some paraphasic speech and repetition when telling a story. Has some weakness noted BLE, but no focal or one-sided weakness. NIHSS 3. - MRI brain was ordered that shows punctate foci of acute infarction in the left temporal occipital junction and right parietal subcortical white matter, an acute cortical infarct along the right  superior frontal gyrus and additional foci of DWI hyperintensity along the left pre and postcentral gyri, likely reflecting subacute infarcts.   - Overall impression: Acute left temporal/occipital, right parietal, right frontal infarcts. Etiology most likely procedure-related. Cardioembolic strokes not related to the procedure are also relatively high on the DDx.    Recommendations  - Frequent Neuro checks per stroke protocol - Vessel imaging: MRA without contrast, Carotid US. Patient is allergic to contrast. - Lipid panel - Statin  - Patient is on Crestor 20 mg - Further cardiac imaging per Cardiology team - A1C - Antithrombotic - currently on Aspirin and Plavix - DVT ppx - currently on Lovenox - SBP goal - Defer to Cardiology for post-AVR/CABG BP goals. Avoid hypotension.  - Telemetry monitoring for arrhythmia - 72h - Swallow screen - Will be performed prior to PO intake - Stroke education - will be given - PT/OT/SLP  ______________________________________________________________________    Pt seen by Neuro NP/APP and by MD.  Lynnae January, DNP, AGACNP-BC [facility] Please use AMION for contact information & EPIC for messaging.  I have seen and examined the patient. I have formulated the assessment and recommendations. 82 year old male who is s/p AVR w/ CABG and LIMA to LAD 11/8. Neurology was consulted due to increased confusion and acute infarcts on MRI. NIHSS 3. Recommendations as above.   Electronically signed: Dr. Caryl Pina

## 2023-07-10 NOTE — Progress Notes (Signed)
      [address]       [name] [address]             [phone]    I was able to speak with the patient's wife in the room this afternoon. She states his confusion seems to be much improved today and the people he discusses are friends and family members. She does note that his speech is more mumbled than it was prior to surgery, he developed some of this after his stroke this past summer but it seems to have worsened according to her. She does also note that he has foot drop but this has been present from his prior stroke. MRI brain with acute and subacute infarcts present, neurology has been consulted.   Jenny Reichmann, PA-C  07/10/23

## 2023-07-10 NOTE — Progress Notes (Addendum)
[address]       [place] [address]             [phone]      5 Days Post-Op Procedure(s) (LRB): AORTIC VALVE REPLACEMENT WITH INSPIRIS AORTIC VALVE (N/A) CORONARY ARTERY BYPASS GRAFTING, USING LEFT INTERNAL MAMMARY ARTERY (N/A) TRANSESOPHAGEAL ECHOCARDIOGRAM (N/A) Subjective: Patient is alert and oriented x 3 this AM, recalls he is in [place] for heart surgery. Still intermittently confused and states his father died 2023-08-05. Does state he has a little shortness of breath, denies pain.   Objective: Vital signs in last 24 hours: Temp:  [97.6 F (36.4 C)-98.7 F (37.1 C)] 98.4 F (36.9 C) (11/13 0314) Pulse Rate:  [83-100] 90 (11/13 0314) Cardiac Rhythm: Heart block (11/12 1908) Resp:  [18-25] 18 (11/13 0314) BP: (107-138)/(57-82) 133/57 (11/13 0314) SpO2:  [91 %-100 %] 94 % (11/13 0314) Weight:  [69.8 kg] 69.8 kg (11/13 0500)  Hemodynamic parameters for last 24 hours:    Intake/Output from previous day: 11/12 0701 - 11/13 0700 In: 480 [P.O.:480] Out: 1000 [Urine:1000] Intake/Output this shift: No intake/output data recorded.  General appearance: alert, cooperative, and no distress Neurologic: strength is equal bilaterally, A&Ox3 to person, place and time, no acute abnormalities Heart: regular rate and rhythm, S1, S2 normal, no murmur, click, rub or gallop Lungs: diminished bibasilar breath sounds Abdomen: soft, non-tender; bowel sounds normal; no masses,  no organomegaly Extremities: extremities normal, atraumatic, no cyanosis or edema Wound: Clean and dry without sign of infection  Lab Results: Recent Labs    07/08/23 0335 07/10/23 0354  WBC 8.7 6.5  HGB 9.2* 9.7*  HCT 28.6* 29.8*  PLT 92* 143*   BMET:  Recent Labs    07/08/23 0335 07/10/23 0354  NA 131* 135  K 4.0 3.6  CL 101 102  CO2 23 25  GLUCOSE 117* 114*  BUN 18 23  CREATININE 0.98 1.21  CALCIUM 8.1* 8.5*    PT/INR: No results for input(s): "LABPROT",  "INR" in the last 72 hours. ABG    Component Value Date/Time   PHART 7.363 07/05/2023 2208   HCO3 21.2 07/05/2023 2208   TCO2 22 07/05/2023 2208   ACIDBASEDEF 4.0 (H) 07/05/2023 2208   O2SAT 90 07/05/2023 2208   CBG (last 3)  Recent Labs    07/09/23 0609 07/09/23 1132 07/09/23 1627  GLUCAP 112* 122* 138*    Assessment/Plan: S/P Procedure(s) (LRB): AORTIC VALVE REPLACEMENT WITH INSPIRIS AORTIC VALVE (N/A) CORONARY ARTERY BYPASS GRAFTING, USING LEFT INTERNAL MAMMARY ARTERY (N/A) TRANSESOPHAGEAL ECHOCARDIOGRAM (N/A)  Neuro: Patient alert and oriented x 3 to person, place and time. Patient with intermittent periods of confusion. On Seroquel 25mg  at night for sundowning and Haldol PRN. No Haldol needed yesterday. Has a sitter this AM. Holding narcotics, pain controlled. Delirium is improving. CVA, on Plavix. As discussed with Dr. Leafy Ro will consult neurology.    CV: On Lopressor 50mg  TID. BP controlled. NSR, HR 80-90s. First degree heart block.    Pulm: Saturating well on RA. Continue nebs, Mucinex, and flutter valve. Encourage IS and ambulation.   GI: Not eating much, denies nausea and vomiting. Will start ensure shakes. -BM passing gas, will start lactulose PRN.   Endo: A1C 6.1, no home meds. Likely prediabetic, will need close outpatient follow up. CBGs controlled on SSI PRN will continue until the patient is eating more. Hypothyroid, on Levothyroxine    Renal: Cr 1.21 up from 0.98 11/11. +7lbs, will hold  Lasix today and supplement K. On home Flomax   Expected postop ABLA: Last H/H 9.7/29.8, trending up. Not clinically significant at this time   Expected thrombocytopenia: Last plt 143,000, trending up  DVT Prophylaxis: Lovenox   Deconditioning: PT/OT recommending SNF     Dispo: Start working on SNF placement   LOS: 5 days    Jenny Reichmann, PA-C 07/10/2023

## 2023-07-10 NOTE — TOC Progression Note (Signed)
Transition of Care [facility]) - Progression Note    Patient Details  Name: CALLUM KUBICEK MRN: [id number] Date of Birth: 1941/04/21  Transition of [facility]) CM/SW Contact  Eduard Roux, [place] Phone Number: 07/10/2023, 3:35 PM  Clinical Narrative:     Called patient's spouse home & cell number, no answer- will attempt to call again at a later time.  Antony Blackbird, MSW, LCSW Clinical Social Worker    Expected Discharge Plan: Home w Home Health Services Barriers to Discharge: Continued Medical Work up  Expected Discharge Plan and Services   Discharge Planning Services: CM Consult Post Acute Care Choice: IP Rehab Living arrangements for the past 2 months: Single Family Home                                       Social Determinants of Health (SDOH) Interventions SDOH Screenings   Food Insecurity: No Food Insecurity (07/08/2023)  Housing: Low Risk  (07/08/2023)  Transportation Needs: No Transportation Needs (07/08/2023)  Utilities: Not At Risk (07/08/2023)  Tobacco Use: Low Risk  (07/05/2023)    Readmission Risk Interventions     No data to display

## 2023-07-10 NOTE — Plan of Care (Signed)
  Problem: Education: Goal: Knowledge of General Education information will improve Description: Including pain rating scale, medication(s)/side effects and non-pharmacologic comfort measures Outcome: Progressing   Problem: Health Behavior/Discharge Planning: Goal: Ability to manage health-related needs will improve Outcome: Progressing   Problem: Clinical Measurements: Goal: Ability to maintain clinical measurements within normal limits will improve Outcome: Progressing Goal: Will remain free from infection Outcome: Progressing Goal: Diagnostic test results will improve Outcome: Progressing Goal: Respiratory complications will improve Outcome: Progressing Goal: Cardiovascular complication will be avoided Outcome: Progressing   Problem: Activity: Goal: Risk for activity intolerance will decrease Outcome: Progressing   Problem: Nutrition: Goal: Adequate nutrition will be maintained Outcome: Progressing   Problem: Coping: Goal: Level of anxiety will decrease Outcome: Progressing   Problem: Elimination: Goal: Will not experience complications related to bowel motility Outcome: Progressing Goal: Will not experience complications related to urinary retention Outcome: Progressing   Problem: Pain Management: Goal: General experience of comfort will improve Outcome: Progressing   Problem: Safety: Goal: Ability to remain free from injury will improve Outcome: Progressing   Problem: Skin Integrity: Goal: Risk for impaired skin integrity will decrease Outcome: Progressing   Problem: Education: Goal: Will demonstrate proper wound care and an understanding of methods to prevent future damage Outcome: Progressing Goal: Knowledge of disease or condition will improve Outcome: Progressing Goal: Knowledge of the prescribed therapeutic regimen will improve Outcome: Progressing Goal: Individualized Educational Video(s) Outcome: Progressing   Problem: Activity: Goal: Risk for  activity intolerance will decrease Outcome: Progressing   Problem: Cardiac: Goal: Will achieve and/or maintain hemodynamic stability Outcome: Progressing   Problem: Clinical Measurements: Goal: Postoperative complications will be avoided or minimized Outcome: Progressing   Problem: Respiratory: Goal: Respiratory status will improve Outcome: Progressing   Problem: Skin Integrity: Goal: Wound healing without signs and symptoms of infection Outcome: Progressing Goal: Risk for impaired skin integrity will decrease Outcome: Progressing   Problem: Urinary Elimination: Goal: Ability to achieve and maintain adequate renal perfusion and functioning will improve Outcome: Progressing

## 2023-07-10 NOTE — TOC Progression Note (Signed)
Transition of Care [facility]) - Progression Note    Patient Details  Name: Bruce Nunez MRN: [id number] Date of Birth: 08/22/1941  Transition of Care [facility]) CM/SW Contact  Eduard Roux, [place] Phone Number: 07/10/2023, 10:37 AM  Clinical Narrative:     Per chart review-on CM noted on 11/11-patient family agreeable to short term rehab at [facility]. CSW sent out referrals.  TOC will provide bed offers once available TOC will continue to follow and assist with discharge planning.  Antony Blackbird, MSW, LCSW Clinical Social Worker    Expected Discharge Plan: Home w Home Health Services Barriers to Discharge: Continued Medical Work up  Expected Discharge Plan and Services   Discharge Planning Services: CM Consult Post Acute Care Choice: IP Rehab Living arrangements for the past 2 months: Single Family Home                                       Social Determinants of Health (SDOH) Interventions SDOH Screenings   Food Insecurity: No Food Insecurity (07/08/2023)  Housing: Low Risk  (07/08/2023)  Transportation Needs: No Transportation Needs (07/08/2023)  Utilities: Not At Risk (07/08/2023)  Tobacco Use: Low Risk  (07/05/2023)    Readmission Risk Interventions     No data to display

## 2023-07-10 NOTE — Progress Notes (Signed)
Occupational Therapy Treatment Patient Details Name: Bruce Nunez MRN: [id number] DOB: Apr 11, 1941 Today's Date: 07/10/2023   History of present illness Pt is a 82yo male who was admitted for dramatic decline in function due to DOE and fatigue. 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 PMH/PSH: aortic stenosis, portstate ca, GERD, s/p R inguinal hernia repair 9/2-15, HTN, HLD, asthma   OT comments  Pt progressing toward established OT goals. Pt needing max education to recall sternal precautions; fair awareness of purpose of heart pillow, but poor functional implementation of use during mobility and ADL. Max cues for redirection during mobility to go to EOB as transport arrived for MRI. Dense education regarding precautions, cardiac rehab course, helping orient pt, and delirium precautions to wife as wife with a lot of questions in regard to progression, pt confusion, etc.       If plan is discharge home, recommend the following:  A little help with walking and/or transfers;A lot of help with bathing/dressing/bathroom;Assistance with cooking/housework;Direct supervision/assist for medications management;Direct supervision/assist for financial management;Assist for transportation;Supervision due to cognitive status   Equipment Recommendations  BSC/3in1;Other (comment) (rw)    Recommendations for Other Services      Precautions / Restrictions Precautions Precautions: Fall;Sternal Precaution Booklet Issued: Yes (comment) Precaution Comments: Recall of need to use heart pillow during transfers but otherwise poor carryover for precautions. Max cues for proper use of heart pillow during transfers with pt attempting to push up from chair and pull on RW Restrictions Weight Bearing Restrictions:  (sternal precautions) RUE Weight Bearing: Non weight bearing LUE Weight Bearing: Non weight bearing Other Position/Activity Restrictions: sternal precautions        Mobility Bed Mobility Overal bed mobility: Needs Assistance Bed Mobility: Sit to Sidelying, Rolling Rolling: Mod assist       Sit to sidelying: Max assist General bed mobility comments: max multimodal cues and tactile cues for hand position throughout    Transfers Overall transfer level: Needs assistance Equipment used: None Transfers: Sit to/from Stand Sit to Stand: Mod assist           General transfer comment: Mod A to stand from recliner w/ pt consistently attempting to push up with UE or pull on RW. RW became distraction so moved away and provided constant cues for hand placement and hands on assist     Balance Overall balance assessment: Needs assistance Sitting-balance support: Feet supported, No upper extremity supported Sitting balance-Leahy Scale: Fair     Standing balance support: No upper extremity supported, During functional activity, Single extremity supported Standing balance-Leahy Scale: Poor                             ADL either performed or assessed with clinical judgement   ADL Overall ADL's : Needs assistance/impaired         Upper Body Bathing: Minimal assistance;Sitting   Lower Body Bathing: Moderate assistance;Sitting/lateral leans;Sit to/from stand           Toilet Transfer: Moderate assistance;Ambulation Toilet Transfer Details (indicate cue type and reason): Pt needing max minimized distractions for proper transfer within precautions           General ADL Comments: Pt limited by acute new cognitive change post op w/ consistent reminders needed for sternal precautions    Extremity/Trunk Assessment  Vision       Perception     Praxis      Cognition Arousal: Alert Behavior During Therapy: WFL for tasks assessed/performed, Impulsive Overall Cognitive Status: Impaired/Different from baseline Area of Impairment: Orientation, Attention, Memory, Following commands, Safety/judgement,  Awareness, Problem solving                 Orientation Level: Disoriented to, Place, Time, Situation Current Attention Level: Sustained, Focused Memory: Decreased recall of precautions, Decreased short-term memory Following Commands: Follows one step commands with increased time Safety/Judgement: Decreased awareness of safety, Decreased awareness of deficits Awareness: Emergent Problem Solving: Difficulty sequencing, Requires verbal cues, Requires tactile cues General Comments: Pt reporting he was walking around without clothing this morning and two large women came and picked him up? Max cues for recall of "move in the tube" precautions and functional implementation during ADL and transfers.        Exercises      Shoulder Instructions       General Comments wife in room. Dense education provided regarding how to orient pt as well as combat confusion. Wife with max concerns about pt confusion but does report it is slightly better today. Pt going down for MRI at end of session.    Pertinent Vitals/ Pain       Pain Assessment Pain Assessment: No/denies pain  Home Living                                          Prior Functioning/Environment              Frequency  Min 1X/week        Progress Toward Goals  OT Goals(current goals can now be found in the care plan section)  Progress towards OT goals: Progressing toward goals  Acute Rehab OT Goals Patient Stated Goal: wife hopeful for pt to go to rehab OT Goal Formulation: With patient/family Time For Goal Achievement: 07/23/23 Potential to Achieve Goals: Good ADL Goals Pt Will Perform Upper Body Dressing: with supervision;sitting Pt Will Perform Lower Body Dressing: with contact guard assist;sit to/from stand Pt Will Transfer to Toilet: with supervision;ambulating Additional ADL Goal #1: Pt to verbalize sternal precautions with no more than min verbal cues  Plan      Co-evaluation                  AM-PAC OT "6 Clicks" Daily Activity     Outcome Measure   Help from another person eating meals?: A Little Help from another person taking care of personal grooming?: A Little Help from another person toileting, which includes using toliet, bedpan, or urinal?: A Lot Help from another person bathing (including washing, rinsing, drying)?: A Lot Help from another person to put on and taking off regular upper body clothing?: A Little Help from another person to put on and taking off regular lower body clothing?: A Lot 6 Click Score: 15    End of Session Equipment Utilized During Treatment: Gait belt  OT Visit Diagnosis: Unsteadiness on feet (R26.81);Other abnormalities of gait and mobility (R26.89);Other symptoms and signs involving cognitive function   Activity Tolerance Patient tolerated treatment well   Patient Left in bed;Other (comment) (with transport)   Nurse Communication Mobility status        Time: 9528-4132 OT Time Calculation (min): 36 min  Charges: OT General Charges $OT Visit: 1  Visit OT Treatments $Self Care/Home Management : 23-37 mins  Tyler Deis, OTR/L [facility] Acute Rehabilitation Office: [phone]   Myrla Halsted 07/10/2023, 1:04 PM

## 2023-07-10 NOTE — NC FL2 (Addendum)
[place] MEDICAID FL2 LEVEL OF CARE FORM     IDENTIFICATION  Patient Name: Bruce Nunez Birthdate: 11-19-1940 Sex: male Admission Date (Current Location): 07/05/2023  [facility] and [place] Number:  [employer] and Address:  [facility], 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   Stroke (HCC) 03/28/2022   PONV (postoperative nausea and vomiting)    Inguinal hernia    Hypertension    Hyperlipidemia    Hx of transfusion    Hx of peptic ulcer    Hx of nonmelanoma skin cancer    Hx of colon cancer, stage I    History of kidney stones    Hemorrhoids    Heart murmur    GERD (gastroesophageal reflux disease)    Frequency of urination    Diabetes mellitus without complication (HCC)    Complication of anesthesia    Asthma    Anemia    Aortic stenosis 04/29/2019   Aortic regurgitation 03/12/2017   Essential hypertension 03/12/2017   Right inguinal hernia 05/08/2016   S/P right inguinal hernia repair Sept 2015 05/17/2014   Prostate cancer (HCC) 08/15/2012    Orientation RESPIRATION BLADDER Height & Weight     Self, Time, Situation, Place  Normal Continent, External catheter Weight: 153 lb 14.1 oz (69.8 kg) Height:  5\' 7"  (170.2 cm)  BEHAVIORAL SYMPTOMS/MOOD NEUROLOGICAL BOWEL NUTRITION STATUS      Continent Diet (please see discharge summary)  AMBULATORY STATUS COMMUNICATION OF NEEDS Skin   Limited Assist  Verbally Surgical wounds (closed incision chest)                       Personal Care Assistance Level of Assistance  Bathing, Feeding, Dressing Bathing Assistance: Limited assistance Feeding assistance: Independent Dressing Assistance: Limited assistance     Functional Limitations Info  Sight, Hearing, Speech Sight Info: Adequate (glasses) Hearing Info: Adequate Speech Info: Adequate    SPECIAL CARE FACTORS FREQUENCY  PT (By licensed PT), OT (By licensed OT)     PT Frequency: 5x per week OT Frequency: 5x per week            Contractures Contractures Info: Not present    Additional Factors Info  Code Status, Allergies Code Status Info: FULL Allergies Info: Penicillins,Iodinated Contrast,Aspirin,Metformin And Related,Monodox,Sulfa Antibiotics,Tramadol           Current Medications (07/10/2023):  This is the current hospital active medication list Current Facility-Administered Medications  Medication Dose Route Frequency Provider Last Rate Last Admin   acetaminophen (TYLENOL) tablet 1,000 mg  1,000 mg Oral Q6H Stehler, Bailey C, PA-C   1,000 mg at 07/10/23 5621   Or   acetaminophen (TYLENOL) 160 MG/5ML solution 1,000 mg  1,000 mg Per Tube Q6H Stehler, Bailey C, PA-C   1,000 mg at 07/07/23 1500   albuterol (PROVENTIL) (2.5 MG/3ML) 0.083% nebulizer solution 2.5 mg  2.5  mg Nebulization Q6H PRN [name], MD       arformoterol [facility]) nebulizer solution 15 mcg  15 mcg Nebulization BID [name], MD   15 mcg at 07/09/23 1950   aspirin chewable tablet 81 mg  81 mg Oral Daily [name], MD   81 mg at 07/10/23 0910   bisacodyl (DULCOLAX) EC tablet 10 mg  10 mg Oral Daily Jenny Reichmann, PA-C   10 mg at 07/09/23 1015   Or   bisacodyl (DULCOLAX) suppository 10 mg  10 mg Rectal Daily Stehler, Fredric Mare C, PA-C       budesonide (PULMICORT) nebulizer solution 0.25 mg  0.25 mg Nebulization BID [name], MD   0.25 mg at 07/09/23 1950   clopidogrel (PLAVIX)  tablet 75 mg  75 mg Oral Daily [name], MD   75 mg at 07/10/23 0911   dextrose 50 % solution 0-50 mL  0-50 mL Intravenous PRN Jenny Reichmann, PA-C       dicyclomine (BENTYL) capsule 10 mg  10 mg Oral BID PRN Jenny Reichmann, PA-C       docusate sodium (COLACE) capsule 200 mg  200 mg Oral Daily Stehler, Bailey C, PA-C   200 mg at 07/09/23 1016   enoxaparin (LOVENOX) injection 40 mg  40 mg Subcutaneous QHS Corliss Skains, MD   40 mg at 07/09/23 2026   feeding supplement (ENSURE ENLIVE / ENSURE PLUS) liquid 237 mL  237 mL Oral TID WC Stehler, Bailey C, PA-C   237 mL at 07/09/23 0750   fesoterodine (TOVIAZ) tablet 4 mg  4 mg Oral Daily Jenny Reichmann, PA-C   4 mg at 07/10/23 0911   fluticasone (FLONASE) 50 MCG/ACT nasal spray 1 spray  1 spray Each Nare Daily Aloha Gell C, PA-C   1 spray at 07/10/23 0911   guaiFENesin (MUCINEX) 12 hr tablet 600 mg  600 mg Oral BID Aloha Gell C, PA-C   600 mg at 07/10/23 1610   hydrALAZINE (APRESOLINE) injection 10 mg  10 mg Intravenous Q6H PRN Corliss Skains, MD   10 mg at 07/06/23 1204   insulin aspart (novoLOG) injection 0-24 Units  0-24 Units Subcutaneous TID Graceann Congress, MD   2 Units at 07/09/23 1655   lactulose (CHRONULAC) 10 GM/15ML solution 20 g  20 g Oral Daily PRN Jenny Reichmann, PA-C       levothyroxine (SYNTHROID) tablet 75 mcg  75 mcg Oral Q0600 Aloha Gell C, PA-C   75 mcg at 07/10/23 0649   metoprolol tartrate (LOPRESSOR) injection 2.5-5 mg  2.5-5 mg Intravenous Q2H PRN Aloha Gell C, PA-C   5 mg at 07/09/23 0321   metoprolol tartrate (LOPRESSOR) tablet 50 mg  50 mg Oral TID Jenny Reichmann, PA-C   50 mg at 07/10/23 0911   ondansetron (ZOFRAN) injection 4 mg  4 mg Intravenous Q6H PRN Jenny Reichmann, PA-C       Oral care mouth rinse  15 mL Mouth Rinse PRN [name], MD       pantoprazole (PROTONIX) EC tablet 40 mg  40 mg Oral Daily Aloha Gell C, PA-C   40 mg at 07/10/23 0914   potassium  chloride SA (KLOR-CON M) CR tablet 20 mEq  20 mEq Oral Daily Jenny Reichmann, PA-C   20 mEq at 07/10/23 0911   potassium chloride SA (KLOR-CON M) CR tablet 40 mEq  40 mEq Oral Once Jenny Reichmann, PA-C  QUEtiapine (SEROQUEL) tablet 25 mg  25 mg Oral QHS [name], MD   25 mg at 07/09/23 2026   rosuvastatin (CRESTOR) tablet 20 mg  20 mg Oral Daily Jenny Reichmann, PA-C   20 mg at 07/10/23 6440   sodium chloride flush (NS) 0.9 % injection 3 mL  3 mL Intravenous PRN Jenny Reichmann, PA-C       tamsulosin (FLOMAX) capsule 0.4 mg  0.4 mg Oral BID Aloha Gell C, PA-C   0.4 mg at 07/10/23 3474     Discharge Medications: Please see discharge summary for a list of discharge medications.  Relevant Imaging Results:  Relevant Lab Results:   Additional Information SSN [id number]  Eduard Roux, LCSW

## 2023-07-11 ENCOUNTER — Encounter (HOSPITAL_COMMUNITY): Payer: Medicare Other

## 2023-07-11 DIAGNOSIS — I6340 Cerebral infarction due to embolism of unspecified cerebral artery: Secondary | ICD-10-CM

## 2023-07-11 DIAGNOSIS — Z952 Presence of prosthetic heart valve: Secondary | ICD-10-CM | POA: Diagnosis not present

## 2023-07-11 LAB — BASIC METABOLIC PANEL
Anion gap: 8 (ref 5–15)
BUN: 18 mg/dL (ref 8–23)
CO2: 23 mmol/L (ref 22–32)
Calcium: 8.4 mg/dL — ABNORMAL LOW (ref 8.9–10.3)
Chloride: 102 mmol/L (ref 98–111)
Creatinine, Ser: 1.28 mg/dL — ABNORMAL HIGH (ref 0.61–1.24)
GFR, Estimated: 56 mL/min — ABNORMAL LOW (ref >60)
Glucose, Bld: 116 mg/dL — ABNORMAL HIGH (ref 70–99)
Potassium: 3.8 mmol/L (ref 3.5–5.1)
Sodium: 133 mmol/L — ABNORMAL LOW (ref 135–145)

## 2023-07-11 LAB — LIPID PANEL
Cholesterol: 74 mg/dL (ref 0–200)
HDL: 11 mg/dL — ABNORMAL LOW (ref >40)
LDL Cholesterol: 33 mg/dL (ref 0–99)
Total CHOL/HDL Ratio: 6.7 {ratio}
Triglycerides: 149 mg/dL (ref <150)
VLDL: 30 mg/dL (ref 0–40)

## 2023-07-11 LAB — HEMOGLOBIN A1C
Hgb A1c MFr Bld: 6.1 % — ABNORMAL HIGH (ref 4.8–5.6)
Mean Plasma Glucose: 128.37 mg/dL

## 2023-07-11 LAB — GLUCOSE, CAPILLARY
Glucose-Capillary: 121 mg/dL — ABNORMAL HIGH (ref 70–99)
Glucose-Capillary: 124 mg/dL — ABNORMAL HIGH (ref 70–99)
Glucose-Capillary: 142 mg/dL — ABNORMAL HIGH (ref 70–99)
Glucose-Capillary: 112 mg/dL — ABNORMAL HIGH (ref 70–99)

## 2023-07-11 MED ORDER — METOPROLOL TARTRATE 100 MG PO TABS
100.0000 mg | ORAL_TABLET | Freq: Two times a day (BID) | ORAL | Status: DC
  Administered 2023-07-11 – 2023-07-15 (×9): 100 mg via ORAL
  Filled 2023-07-11 (×9): qty 1

## 2023-07-11 MED ORDER — FUROSEMIDE 40 MG PO TABS
40.0000 mg | ORAL_TABLET | Freq: Every day | ORAL | Status: DC
  Administered 2023-07-11 – 2023-07-16 (×6): 40 mg via ORAL
  Filled 2023-07-11 (×6): qty 1

## 2023-07-11 MED ORDER — ACETAMINOPHEN 500 MG PO TABS
1000.0000 mg | ORAL_TABLET | Freq: Four times a day (QID) | ORAL | Status: DC
  Administered 2023-07-11 – 2023-07-18 (×25): 1000 mg via ORAL
  Filled 2023-07-11 (×27): qty 2

## 2023-07-11 MED ORDER — GUAIFENESIN ER 600 MG PO TB12: 600.0000 mg | ORAL_TABLET | Freq: Two times a day (BID) | ORAL | Status: DC | PRN

## 2023-07-11 MED FILL — Heparin Sodium (Porcine) Inj 1000 Unit/ML: Qty: 1000 | Status: AC

## 2023-07-11 MED FILL — Lidocaine HCl Local Preservative Free (PF) Inj 2%: INTRAMUSCULAR | Qty: 14 | Status: AC

## 2023-07-11 MED FILL — Potassium Chloride Inj 2 mEq/ML: INTRAVENOUS | Qty: 40 | Status: AC

## 2023-07-11 NOTE — Plan of Care (Signed)

## 2023-07-11 NOTE — Progress Notes (Signed)
Physical Therapy Treatment Patient Details Name: Bruce Nunez MRN: [id number] DOB: 02/17/1941 Today's Date: 07/11/2023   History of Present Illness Pt is a 82yo male who was admitted for dramatic decline in function due to DOE and fatigue. 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 PMH/PSH: aortic stenosis, portstate ca, GERD, s/p R inguinal hernia repair 9/2-15, HTN, HLD, asthma    PT Comments  Pt is slowly progressing towards goals. Pt continues to require Min A for bed mobility with heavy cueing for safety to maintain sternal precautions. Pt is able to state 2 of 3 sternal precautions but is unable to follow without heavy reminders throughout session. Pt constantly trying to push/pull and lift at various times throughout session. Pt has difficulty following one step commands almost resulting in a fall that was prevented through safe handling techniques of this physical therapist requiring Max A to prevent pt from losing balance and hitting sink/wall in bathroom. Due to pt current functional status, home set up and available assistance at home recommending skilled physical therapy services in order to decrease risk for falls, injury, to protect integrity of surgical site and prevent re-hospitalization.     If plan is discharge home, recommend the following: Supervision due to cognitive status;Help with stairs or ramp for entrance;Direct supervision/assist for medications management;Assist for transportation;A little help with walking and/or transfers   Can travel by private vehicle     No  Equipment Recommendations  Other (comment) (defer to post acute)       Precautions / Restrictions Precautions Precautions: Fall;Sternal Precaution Comments: Pt can remember no pushing/no pulling but does not follow precautions with functional mobility Restrictions RUE Weight Bearing: Non weight bearing LUE Weight Bearing: Non weight bearing Other  Position/Activity Restrictions: sternal precautions     Mobility  Bed Mobility Overal bed mobility: Needs Assistance Bed Mobility: Sit to Sidelying, Sidelying to Sit   Sidelying to sit: Min assist     Sit to sidelying: Mod assist General bed mobility comments: heavy cueing to prevent pulling on bed rail    Transfers Overall transfer level: Needs assistance Equipment used: None Transfers: Sit to/from Stand Sit to Stand: Min assist           General transfer comment: Min A from EOB for standing with heavy cueing for body mechanics and to prevent pt pulling up from RW or pushing up from the bed.    Ambulation/Gait Ambulation/Gait assistance: Min assist Gait Distance (Feet): 15 Feet Assistive device: Rolling walker (2 wheels) Gait Pattern/deviations: Step-through pattern, Decreased stride length, Shuffle Gait velocity: decreased Gait velocity interpretation: <1.8 ft/sec, indicate of risk for recurrent falls   General Gait Details: Min A pt condom cath fell off and urine got all over the floor; assist NT with cleaning up pt and floor. Pt was fatigued after standing for self care and clothes changes. Did not progress gait further.        Balance Overall balance assessment: Needs assistance Sitting-balance support: Feet supported, No upper extremity supported Sitting balance-Leahy Scale: Fair Sitting balance - Comments: Supervision for safety   Standing balance support: Bilateral upper extremity supported Standing balance-Leahy Scale: Poor Standing balance comment: Pt was trying to step away from bed and had LOB requiring Max A of physical therapist to intervene to prevent fall.              Cognition Arousal: Alert Behavior During Therapy: WFL for tasks assessed/performed,  Impulsive Overall Cognitive Status: Impaired/Different from baseline Area of Impairment: Following commands, Safety/judgement, Awareness     Orientation Level: Disoriented to, Place, Time,  Situation   Memory: Decreased recall of precautions, Decreased short-term memory Following Commands: Follows one step commands with increased time, Follows one step commands inconsistently Safety/Judgement: Decreased awareness of safety, Decreased awareness of deficits   Problem Solving: Difficulty sequencing, Requires verbal cues, Requires tactile cues General Comments: Pt was stating that there was a baby visiting him all night. He belives he is in [place] and when prompted that he is in [place] started stating it is becuase he has been to 4 hospitals so he can't remember where he is.           General Comments General comments (skin integrity, edema, etc.): Pt was assisted with getting clean after codom cath fell off and pt standing at EOB for clean gown; pt then started stepping away from bed stating he needed to step away to give room. Pt was notified to stop and stand by the bed pt then kept trying to step away backward and had a loss of balance; Physical therapist had to provide Max A to prevent pt from falling posteriorly and hitting the wall. Pt was educated on safety and the importance of staying close to the bed and following directions in order to decrease risk for falls.      Pertinent Vitals/Pain Pain Assessment Pain Assessment: No/denies pain           PT Goals (current goals can now be found in the care plan section) Acute Rehab PT Goals Patient Stated Goal: didn't state PT Goal Formulation: Patient unable to participate in goal setting Time For Goal Achievement: 07/23/23 Progress towards PT goals: Progressing toward goals    Frequency    Min 1X/week      PT Plan  Continue with current POC       AM-PAC PT "6 Clicks" Mobility   Outcome Measure  Help needed turning from your back to your side while in a flat bed without using bedrails?: A Little Help needed moving from lying on your back to sitting on the side of a flat bed without using bedrails?:  A Little Help needed moving to and from a bed to a chair (including a wheelchair)?: A Little Help needed standing up from a chair using your arms (e.g., wheelchair or bedside chair)?: A Little Help needed to walk in hospital room?: A Little Help needed climbing 3-5 steps with a railing? : Total 6 Click Score: 16    End of Session Equipment Utilized During Treatment: Gait belt Activity Tolerance: Patient tolerated treatment well Patient left: in bed;with call bell/phone within reach;with bed alarm set Nurse Communication: Mobility status PT Visit Diagnosis: Unsteadiness on feet (R26.81);Muscle weakness (generalized) (M62.81);Difficulty in walking, not elsewhere classified (R26.2)     Time: 4696-2952 PT Time Calculation (min) (ACUTE ONLY): 30 min  Charges:    $Therapeutic Activity: 23-37 mins PT General Charges $$ ACUTE PT VISIT: 1 Visit                     Harrel Carina, DPT, CLT  Acute Rehabilitation Services Office: [phone] (Secure chat preferred)    Claudia Desanctis 07/11/2023, 2:01 PM

## 2023-07-11 NOTE — Evaluation (Signed)
Clinical/Bedside Swallow Evaluation Patient Details  Name: Bruce Nunez MRN: [id number] Date of Birth: Dec 04, 1940  Today's Date: 07/11/2023 Time: SLP Start Time (ACUTE ONLY): 1115 SLP Stop Time (ACUTE ONLY): 1137 SLP Time Calculation (min) (ACUTE ONLY): 22 min  Past Medical History:  Past Medical History:  Diagnosis Date   Anemia    recent iron supplement   Aortic regurgitation 03/12/2017   Aortic stenosis 04/29/2019   Asthma    childhood-   2013 bronchitis w/wheezing   Complication of anesthesia    " ether made me sick" - "woke up during colonoscopy"   Diabetes mellitus without complication (HCC)    Essential hypertension 03/12/2017   Frequency of urination    GERD (gastroesophageal reflux disease)    occasional   Heart murmur    Hemorrhoids    History of kidney stones    Hx of colon cancer, stage I    Hx of nonmelanoma skin cancer    Hx of peptic ulcer    as teenager   Hx of transfusion    Hyperlipidemia    Hypertension    Inguinal hernia    PONV (postoperative nausea and vomiting)    only when had Ether as Anesthesia drug   Prostate cancer (HCC) 08/15/2012   also Hx colon cancer / Hx skin cancer   Right inguinal hernia 05/08/2016   S/P right inguinal hernia repair Sept 2015 05/17/2014   Recurrent right indirect  Hernia treated with robotic TAPP repair with enterolysis Sept 2017   Stroke [facility]) 03/28/2022   Past Surgical History:  Past Surgical History:  Procedure Laterality Date   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   Procedure: AORTIC VALVE REPLACEMENT WITH INSPIRIS AORTIC VALVE;  Surgeon: [name], MD;  Location: [facility] OR;  Service: Open Heart Surgery;  Laterality: N/A;   cataract surgery  2009   CORONARY ARTERY BYPASS GRAFT N/A 07/05/2023   Procedure: CORONARY ARTERY BYPASS GRAFTING, USING LEFT INTERNAL MAMMARY ARTERY;  Surgeon: [name], MD;  Location: [facility] OR;  Service: Open Heart Surgery;  Laterality: N/A;   CYSTOSCOPY     INGUINAL HERNIA  REPAIR Right 05/17/2014   Procedure: RIGHT INGUINAL HERNIA REPAIR ;  Surgeon: Wenda Low, MD;  Location: [facility] ORS;  Service: General;  Laterality: Right;  With MESH   KNEE SURGERY Right 2012   LEFT HEART CATH AND CORONARY ANGIOGRAPHY N/A 05/03/2023   Procedure: LEFT HEART CATH AND CORONARY ANGIOGRAPHY;  Surgeon: Tonny Bollman, MD;  Location: [facility] INVASIVE CV LAB;  Service: Cardiovascular;  Laterality: N/A;   PARTIAL COLECTOMY  2005   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   RADIOACTIVE SEED IMPLANT N/A 11/24/2012   Procedure: RADIOACTIVE SEED IMPLANT;  Surgeon: Valetta Fuller, MD;  Location: [facility];  Service: Urology;  Laterality: N/A;   71seeds implanted  no seeds found in bladder   TEE WITHOUT CARDIOVERSION N/A 07/05/2023   Procedure: TRANSESOPHAGEAL ECHOCARDIOGRAM;  Surgeon: [name], MD;  Location: [facility] OR;  Service: Open Heart Surgery;  Laterality: N/A;   THROAT SURGERY  1940's   as child (age 17)growth that created fissure/corrective surgery   TONSILLECTOMY     age 26   XI ROBOTIC ASSISTED INGUINAL HERNIA REPAIR WITH MESH Right 05/08/2016   Procedure: XI ROBOTIC ASSISTED REPAIR OF RECURRENT RIGHT INGUINAL HERNIA;  Surgeon: Luretha Murphy, MD;  Location: [facility] ORS;  Service: General;  Laterality: Right;  With MESH   HPI:  Bruce Nunez is an 82 y.o. 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 Neurology was consulted 11/13 due to increased confusion.      MRI brain was ordered that shows punctate foci of acute infarction in the left temporal occipital  junction and right parietal subcortical white matter, an acute cortical infarct along the right superior frontal gyrus and additional foci of DWI hyperintensity along the left pre and  postcentral gyri, likely reflecting subacute infarcts.     Assessment / Plan / Recommendation  Clinical Impression  Patient presents with a normal oropharyngeal swallowing ablities. No overt s/s of aspiration observed. Oral cavity dry but family reports this is baseline, improved with sips of liquid. Mastication normal. Patient reports decreased appetite. No SLP f/u for swallow indicated however did request order for cognitive-linguistic evaluation given changes from baseline reported s/p recent CVAs. SLP Visit Diagnosis: Dysphagia, unspecified (R13.10)    Aspiration Risk  No limitations    Diet Recommendation Regular;Thin liquid    Liquid Administration via: Cup;Straw Medication Administration: Whole meds with liquid Supervision: Patient able to self feed Compensations: Slow rate;Small sips/bites Postural Changes: Seated upright at 90 degrees    Other  Recommendations Oral Care Recommendations: Oral care BID    Recommendations for follow up therapy are one component of a multi-disciplinary discharge planning process, led by the attending physician.  Recommendations may be updated based on patient status, additional functional criteria and insurance authorization.  Follow up Recommendations No SLP follow up         Functional Status Assessment Patient has not had a recent decline in their functional status    Swallow Study   General HPI: Bruce Nunez is an 82 y.o. 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 Neurology was consulted 11/13 due to increased confusion.      MRI brain was ordered that shows punctate foci of acute infarction in the left temporal occipital  junction and right parietal subcortical white matter, an acute cortical infarct along the right superior frontal gyrus and additional foci of DWI hyperintensity along the left pre and   postcentral gyri, likely reflecting subacute infarcts. Type of Study: Bedside Swallow Evaluation Previous Swallow Assessment: none Diet Prior to this Study: Regular;Thin liquids (Level 0) Temperature Spikes Noted: No Respiratory Status: Room air History of Recent Intubation: No Behavior/Cognition: Alert;Cooperative;Pleasant mood Oral Cavity Assessment: Dry Oral Care Completed by SLP: No Oral Cavity - Dentition: Adequate natural dentition Vision: Functional for self-feeding Self-Feeding Abilities: Able to feed self Patient Positioning: Upright in bed Baseline Vocal Quality: Normal Volitional Cough: Strong Volitional Swallow: Unable to elicit    Oral/Motor/Sensory Function Overall Oral Motor/Sensory Function: Mild impairment Facial ROM: Within Functional Limits Facial Symmetry: Within Functional Limits Facial Strength: Within Functional Limits Facial Sensation: Within Functional Limits Lingual ROM: Within Functional Limits Lingual Symmetry: Within Functional Limits Lingual Strength: Reduced (right, minimal) Lingual Sensation: Within Functional Limits Velum: Within Functional Limits Mandible: Within Functional Limits   Ice Chips Ice chips: Not tested   Thin Liquid Thin Liquid: Within functional limits Presentation: Self Fed;Straw    Nectar Thick Nectar Thick Liquid: Not tested  Honey Thick Honey Thick Liquid: Not tested   Puree Puree: Within functional limits Presentation: Self Fed;Spoon   Solid     Solid: Within functional limits Presentation: Self Fed     [employer] MA, CCC-SLP  Yanna Leaks Meryl 07/11/2023,12:08 PM

## 2023-07-11 NOTE — Progress Notes (Addendum)
Mobility Specialist Progress Note:   07/11/23 1554  Mobility  Activity Ambulated with assistance in hallway  Level of Assistance Minimal assist, patient does 75% or more  Assistive Device Front wheel walker  Distance Ambulated (ft) 160 ft  RUE Weight Bearing NWB  LUE Weight Bearing NWB  Activity Response Tolerated well  Mobility Referral Yes  $Mobility charge 1 Mobility  Mobility Specialist Start Time (ACUTE ONLY) 1540  Mobility Specialist Stop Time (ACUTE ONLY) 1554  Mobility Specialist Time Calculation (min) (ACUTE ONLY) 14 min    Pt received in bed and found to have BM. RN notified. After clean up pt agreeable to ambulate. Educated pt on sternal precautions. MinA to stand from elevated bed height. Upon standing pt reaching out for bed rails after power up. Pt able to ambulate in hallway with CG. Pt very unsteady requiring verbal cues to stay within RW and standing rest break to prevent potential fall. When returning to room pt found to have urinated in room and hallway soiling gown. Student RN notified and assisted with clean up. Pt left in chair with call bell in reach and all needs met.  [name]  Mobility Specialist Please contact via [employer] office at [phone]

## 2023-07-11 NOTE — Progress Notes (Signed)
CARDIAC REHAB PHASE I   Stopped by to offer walk. Pt was working with speech at the time. Will return later today to offer walk, as time allows. Will continue to follow and provide post op OHS education with pt and family. Plan for discharge to SNF ongoing.   [id number] Woodroe Chen, RN BSN 07/11/2023 12:36 PM

## 2023-07-11 NOTE — Progress Notes (Addendum)
[address]       [place] [address]             [phone]      6 Days Post-Op Procedure(s) (LRB): AORTIC VALVE REPLACEMENT WITH INSPIRIS AORTIC VALVE (N/A) CORONARY ARTERY BYPASS GRAFTING, USING LEFT INTERNAL MAMMARY ARTERY (N/A) TRANSESOPHAGEAL ECHOCARDIOGRAM (N/A) Subjective: The patient is alert and oriented to person and time today but not place. He does admit to discomfort and difficulty with deep breaths. +BM. Did not have a sitter yesterday.   Objective: Vital signs in last 24 hours: Temp:  [97.7 F (36.5 C)-98.5 F (36.9 C)] 98.5 F (36.9 C) (11/14 0423) Pulse Rate:  [30-99] 30 (11/14 0622) Resp:  [18-21] 19 (11/14 0622) BP: (116-181)/(64-99) 176/77 (11/14 0423) SpO2:  [95 %-100 %] 100 % (11/14 0622) Weight:  [69.3 kg] 69.3 kg (11/14 0622)  Hemodynamic parameters for last 24 hours:    Intake/Output from previous day: 11/13 0701 - 11/14 0700 In: 740 [P.O.:740] Out: 3 [Urine:2; Stool:1] Intake/Output this shift: No intake/output data recorded.  General appearance: alert, cooperative, delirious, and no distress Neurologic: Dysarthria, no acute weakness, alert and oriented to person and time but not place this AM.  Heart: regular rate and rhythm, S1, S2 normal, no murmur, click, rub or gallop Lungs: diminished bibasilar breath sounds Abdomen: soft, non-tender; bowel sounds normal; no masses,  no organomegaly Extremities: extremities normal, atraumatic, no cyanosis or edema Wound: Clean and dry without sign of infection  Lab Results: Recent Labs    07/10/23 0354  WBC 6.5  HGB 9.7*  HCT 29.8*  PLT 143*   BMET:  Recent Labs    07/10/23 0354 07/11/23 0437  NA 135 133*  K 3.6 3.8  CL 102 102  CO2 25 23  GLUCOSE 114* 116*  BUN 23 18  CREATININE 1.21 1.28*  CALCIUM 8.5* 8.4*    PT/INR: No results for input(s): "LABPROT", "INR" in the last 72 hours. ABG    Component Value Date/Time   PHART 7.363 07/05/2023 2208    HCO3 21.2 07/05/2023 2208   TCO2 22 07/05/2023 2208   ACIDBASEDEF 4.0 (H) 07/05/2023 2208   O2SAT 90 07/05/2023 2208   CBG (last 3)  Recent Labs    07/10/23 1622 07/10/23 2109 07/11/23 0620  GLUCAP 138* 132* 121*    Assessment/Plan: S/P Procedure(s) (LRB): AORTIC VALVE REPLACEMENT WITH INSPIRIS AORTIC VALVE (N/A) CORONARY ARTERY BYPASS GRAFTING, USING LEFT INTERNAL MAMMARY ARTERY (N/A) TRANSESOPHAGEAL ECHOCARDIOGRAM (N/A)  Neuro: Patient alert and oriented x 3 to person and time. Not alert to place this AM. Patient with intermittent periods of confusion. On Seroquel 25mg  at night for sundowning. Did not have a sitter yesterday. Holding narcotics, will restart scheduled Tylenol. Brain MRI showed acute left temporal/occipital, right parietal and right frontal infarcts. Neurology was consulted and recommended MRA without contrast and carotid ultrasound which has been ordered. Will order SLP evaluation as recommended. Continue Plavix and ASA which was restarted for previous CVA.    CV: On Lopressor 50mg  TID. SBP elevated up to 181. NSR, HR 90s. Postoperative first degree heart block. Titrate BB.    Pulm: Saturating well on RA. Continue nebs, Mucinex PRN, and flutter valve. Encourage IS and ambulation. Not using IS and flutter valve much due to confusion.    GI: Not eating much, denies nausea and vomiting. On ensure shakes but has only drank 1, discussed the importance nutrition. SLP consult. +BM   Endo: A1C 6.1,  no home meds. Likely prediabetic, will need close outpatient follow up. CBGs controlled on SSI PRN, will d/c SSI and CBG. Hypothyroid, on Levothyroxine    Renal: Cr 1.28 up from 1.21 11/13 after Lasix was held. +6lbs, will continue to hold Lasix today and supplement K. On home Flomax.   Expected postop ABLA: Last H/H 9.7/29.8, trending up. Not clinically significant at this time   Expected thrombocytopenia: Last plt 143,000, trending up   DVT Prophylaxis: Lovenox    Deconditioning: PT/OT recommending SNF     Dispo: Working on SNF placement, patient needs to be without a Comptroller for 48 hours before transferring to SNF. Hopefully can d/c to SNF early next week.    LOS: 6 days    Jenny Reichmann, PA-C 07/11/2023

## 2023-07-11 NOTE — Progress Notes (Signed)
STROKE TEAM PROGRESS NOTE   SUBJECTIVE (INTERVAL HISTORY) His wife and granddaughter and grandson are at the bedside.  Overall his condition is gradually improving. Per family, he seems more awake alert and less confused today. On exam, he is slight drowsy but orientated x 3. No focal deficit.    OBJECTIVE Temp:  [97.4 F (36.3 C)-98.5 F (36.9 C)] 98 F (36.7 C) (11/14 1101) Pulse Rate:  [30-97] 89 (11/14 0752) Resp:  [18-21] 20 (11/14 1101) BP: (116-187)/(62-99) 139/62 (11/14 1101) SpO2:  [96 %-100 %] 96 % (11/14 1101) Weight:  [69.3 kg] 69.3 kg (11/14 0622)  Recent Labs  Lab 07/10/23 1125 07/10/23 1622 07/10/23 2109 07/11/23 0620 07/11/23 1201  GLUCAP 119* 138* 132* 121* 124*   Recent Labs  Lab 07/06/23 0418 07/06/23 1747 07/07/23 0323 07/08/23 0335 07/10/23 0354 07/11/23 0437  NA 134* 130* 131* 131* 135 133*  K 3.8 4.2 4.5 4.0 3.6 3.8  CL 107 103 104 101 102 102  CO2 22 18* 22 23 25 23   GLUCOSE 143* 108* 126* 117* 114* 116*  BUN 13 17 16 18 23 18   CREATININE 1.21 1.33* 1.17 0.98 1.21 1.28*  CALCIUM 8.3* 8.2* 8.0* 8.1* 8.5* 8.4*  MG 3.0* 2.6* 2.5* 2.0  --   --    No results for input(s): "AST", "ALT", "ALKPHOS", "BILITOT", "PROT", "ALBUMIN" in the last 168 hours. Recent Labs  Lab 07/05/23 1903 07/05/23 2028 07/06/23 0418 07/06/23 1747 07/07/23 0323 07/08/23 0335 07/10/23 0354  WBC 9.4  --  9.9 13.2* 8.0 8.7 6.5  NEUTROABS 6.9  --   --   --   --   --   --   HGB 9.9*   < > 9.2* 10.7* 9.0* 9.2* 9.7*  HCT 29.2*   < > 27.5* 32.3* 27.7* 28.6* 29.8*  MCV 83.2  --  82.8 83.9 86.8 85.6 84.7  PLT 87*  --  93* 104* 67* 92* 143*   < > = values in this interval not displayed.   No results for input(s): "CKTOTAL", "CKMB", "CKMBINDEX", "TROPONINI" in the last 168 hours. No results for input(s): "LABPROT", "INR" in the last 72 hours. No results for input(s): "COLORURINE", "LABSPEC", "PHURINE", "GLUCOSEU", "HGBUR", "BILIRUBINUR", "KETONESUR", "PROTEINUR",  "UROBILINOGEN", "NITRITE", "LEUKOCYTESUR" in the last 72 hours.  Invalid input(s): "APPERANCEUR"     Component Value Date/Time   CHOL 74 07/11/2023 0437   CHOL 95 (L) 03/21/2023 1059   TRIG 149 07/11/2023 0437   HDL 11 (L) 07/11/2023 0437   HDL 32 (L) 03/21/2023 1059   CHOLHDL 6.7 07/11/2023 0437   VLDL 30 07/11/2023 0437   LDLCALC 33 07/11/2023 0437   LDLCALC 39 03/21/2023 1059   Lab Results  Component Value Date   HGBA1C 6.1 (H) 07/11/2023   No results found for: "LABOPIA", "COCAINSCRNUR", "LABBENZ", "AMPHETMU", "THCU", "LABBARB"  No results for input(s): "ETH" in the last 168 hours.  I have personally reviewed the radiological images below and agree with the radiology interpretations.  MR ANGIO HEAD WO CONTRAST  Result Date: 07/11/2023 CLINICAL DATA:  Initial evaluation for acute stroke. EXAM: MRA HEAD WITHOUT CONTRAST TECHNIQUE: Angiographic images of the Circle of Willis were acquired using MRA technique without intravenous contrast. COMPARISON:  Prior brain MRI from earlier the same day. FINDINGS: Anterior circulation: Examination technically limited by motion. Additionally, the MIP reconstructions for the anterior circulation are clipped, incompletely assessing the vasculature. Both internal carotid arteries are patent through the siphons without significant stenosis or other abnormality. A1 segments patent  bilaterally. Right A1 hypoplastic. Normal anterior communicating artery complex. Both ACAs appear patent to their distal aspects on time-of-flight sequence. No visible stenosis. No M1 stenosis or occlusion. No proximal MCA branch occlusion or high-grade stenosis. Distal MCA branches appear well perfused and symmetric on 3D time-of-flight. No visible beading or irregularity. Posterior circulation: Both V4 segments patent without stenosis. Left vertebral artery slightly dominant. Both PICA patent at their origins. Basilar patent without stenosis. Superior cerebral arteries patent  bilaterally. Left PCA supplied via the basilar. Right PCA supplied via the basilar as well as a prominent right posterior communicating artery. Both PCAs patent to their distal aspects without hemodynamically significant stenosis. No visible beading or irregularity. Anatomic variants: As above. Other: No intracranial aneurysm. IMPRESSION: Negative intracranial MRA for large vessel occlusion or other vascular abnormality. No hemodynamically significant or correctable stenosis. Electronically Signed   By: Rise Mu M.D.   On: 07/11/2023 06:25   MR BRAIN WO CONTRAST  Result Date: 07/10/2023 CLINICAL DATA:  Mental status change, unknown cause. EXAM: MRI HEAD WITHOUT CONTRAST TECHNIQUE: Multiplanar, multiecho pulse sequences of the brain and surrounding structures were obtained without intravenous contrast. COMPARISON:  MRI brain 05/08/2023. FINDINGS: Brain: Punctate foci of acute infarction in the left temporal occipital junction and right parietal subcortical white matter. 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 Additional foci of DWI hyperintensity along the left pre and postcentral gyri, likely reflecting subacute infarcts. No acute hemorrhage or significant mass effect. No hydrocephalus or extra-axial collection. Vascular: Normal flow voids. Skull and upper cervical spine: No calvarial fracture or suspicious bone lesion. Skull base is unremarkable. Sinuses/Orbits: Unremarkable. Other: None. IMPRESSION: 1. Punctate foci of acute infarction in the left temporal occipital junction and right parietal subcortical white matter. 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 No acute hemorrhage or significant mass effect. 2. Additional foci of DWI hyperintensity along the left pre and postcentral gyri, likely reflecting subacute infarcts. Electronically Signed   By: Orvan Falconer M.D.   On: 07/10/2023 12:46   DG Chest 2 View  Result Date: 07/10/2023 CLINICAL  DATA:  AVR. EXAM: CHEST - 2 VIEW COMPARISON:  X-ray 07/07/2023.  Older exams as well. FINDINGS: Sternal wires. Enlarged cardiopericardial silhouette. Decreasing lung base opacities with some residual greatest left retrocardiac. Persistent pleural effusions. No pneumothorax. Overlapping cardiac leads. Aortic valve replacement. Interval removal of right IJ Cordis, mediastinal drains and left chest tube. No pneumothorax seen. IMPRESSION: Interval removal of multiple tubes and lines. No pneumothorax. Decreasing lung base opacities with some residual. Enlarged heart.  Postop chest. Electronically Signed   By: Karen Kays M.D.   On: 07/10/2023 11:12   DG Chest Port 1 View  Result Date: 07/07/2023 CLINICAL DATA:  Pneumothorax. EXAM: PORTABLE CHEST 1 VIEW COMPARISON:  Chest radiograph from the prior day. FINDINGS: The heart size and mediastinal contours are within normal limits. A right internal jugular vascular sheath tip overlies the superior vena cava. Mediastinal and thoracostomy tubes appear unchanged. There is a small left pleural effusion with associated atelectasis/airspace disease, similar to prior exam. Mild right basilar atelectasis/airspace disease appears unchanged. No significant pneumothorax. IMPRESSION: Small left pleural effusion with associated atelectasis/airspace disease, similar to prior exam. Mild right basilar atelectasis/airspace disease appears unchanged. Electronically Signed   By: Romona Curls M.D.   On: 07/07/2023 09:51   ECHO INTRAOPERATIVE TEE  Result Date: 07/06/2023  *INTRAOPERATIVE TRANSESOPHAGEAL REPORT *  Patient Name:   ABDULLAH GRAVELL  Date of Exam: 07/05/2023 Medical Rec #:  [id number]      Height:       67.0 in Accession #:    [id number]     Weight:       146.3 lb Date of Birth:  [birth year]       BSA:          1.77 m Patient Age:    82 years       BP:           161/54 mmHg Patient Gender: M              HR:           81 bpm. Exam Location:  Anesthesiology Transesophogeal exam was  perform intraoperatively during surgical procedure. Patient was closely monitored under general anesthesia during the entirety of examination. 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 POST-OP IMPRESSIONS _ Left Ventricle: The left ventricle is unchanged from pre-bypass. _ Right Ventricle: The right ventricle appears unchanged from pre-bypass. _ Aorta: The aorta appears unchanged from pre-bypass. _ Left Atrial Appendage: The left atrial appendage appears unchanged from pre-bypass. _ Aortic Valve: A bioprosthetic valve was placed, leaflets are freely mobile Size; 21mm. The gradient recorded across the prosthetic valve is within the expected range. No perivalvular leak noted. Mean PG _ Mitral Valve: The mitral valve appears unchanged from pre-bypass. _ Tricuspid Valve: The tricuspid valve appears unchanged from pre-bypass. _ Pulmonic Valve: The pulmonic valve appears unchanged from pre-bypass. _ Interatrial Septum: The interatrial septum appears unchanged from pre-bypass. _ Pericardium: The pericardium appears unchanged from pre-bypass. _ Comments: S/P AVR CABG. No new or worsening wall motion or valvular abnormalities post procedure. 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 Seated appropriately without rock or leak. Gradients WNL. PRE-OP FINDINGS  Left Ventricle: The left ventricle has normal systolic function, with an ejection fraction of 60-65%. The cavity size was normal. Left ventricular diastolic parameters are consistent with Grade I diastolic dysfunction (impaired relaxation). Right Ventricle: The right ventricle has normal systolic function. The cavity was normal. There is no increase in right ventricular wall thickness. Right ventricular systolic pressure is normal. Left Atrium: Left atrial size was normal in size. No left  atrial/left atrial appendage thrombus was detected. Left atrial appendage velocity is normal at greater than 40 cm/s. Right Atrium: Right atrial size was normal in size. Interatrial Septum: No atrial level shunt detected by color flow Doppler. The interatrial septum appears to be lipomatous. There is no evidence of a patent foramen ovale. Pericardium: There is no evidence of pericardial effusion. There is no pleural effusion. Mitral Valve: The mitral valve is normal in structure. Mitral valve regurgitation is trivial by color flow Doppler. There is no evidence of mitral valve vegetation. There is No evidence of mitral stenosis. Tricuspid Valve: The tricuspid valve was normal in structure. Tricuspid valve regurgitation is trivial by color flow Doppler. No evidence of tricuspid stenosis is present. There is no evidence of tricuspid valve vegetation. Aortic Valve: The aortic valve is tricuspid. Aortic valve regurgitation is moderate to severe. The jet is eccentric anteriorly directed. There is mild stenosis of the aortic valve, with a calculated valve area of 1.40 cm. AI PHT , VC .7cm, diastolic reversal descending thoracic aorta, EROA PISA .32cm2. Pulmonic Valve: The pulmonic valve was normal in structure, with normal. No evidence of pumonic stenosis. Pulmonic valve regurgitation is mild by color  flow Doppler. Aorta: The aortic root and ascending aorta are normal in size and structure. Pulmonary Artery: Theone Murdoch catheter present on the right. The pulmonary artery is of normal size. Shunts: There is no evidence of an atrial septal defect. +--------------+--------++ LEFT VENTRICLE         +--------------+--------++ PLAX 2D                +--------------+--------++ LVIDd:        4.80 cm  +--------------+--------++ LVIDs:        3.00 cm  +--------------+--------++ LVOT diam:    2.00 cm  +--------------+--------++ LV SV:        73 ml    +--------------+--------++ LV SV Index:  40.86     +--------------+--------++ LVOT Area:    3.14 cm +--------------+--------++                        +--------------+--------++ +------------------+------------++ AORTIC VALVE                   +------------------+------------++ AV Area (Vmax):   1.34 cm     +------------------+------------++ AV Area (Vmean):  1.36 cm     +------------------+------------++ AV Area (VTI):    1.40 cm     +------------------+------------++ AV Vmax:          279.00 cm/s  +------------------+------------++ AV Vmean:         193.000 cm/s +------------------+------------++ AV VTI:           0.601 m      +------------------+------------++ AV Peak Grad:     31.1 mmHg    +------------------+------------++ AV Mean Grad:     17.0 mmHg    +------------------+------------++ LVOT Vmax:        119.00 cm/s  +------------------+------------++ LVOT Vmean:       83.800 cm/s  +------------------+------------++ LVOT VTI:         0.267 m      +------------------+------------++ LVOT/AV VTI ratio:0.44         +------------------+------------++  +-------------+-------++ AORTA                +-------------+-------++ Ao Root diam:3.10 cm +-------------+-------++ Ao STJ diam: 2.4 cm  +-------------+-------++ Ao Asc diam: 3.20 cm +-------------+-------++ +--------------+----------++ MITRAL VALVE              +--------------+-------+ +--------------+----------++  SHUNTS                MV Area (PHT):3.63 cm    +--------------+-------+ +--------------+----------++  Systemic VTI: 0.27 m  MV Peak grad: 3.0 mmHg    +--------------+-------+ +--------------+----------++  Systemic Diam:2.00 cm MV Mean grad: 1.0 mmHg    +--------------+-------+ +--------------+----------++ MV Vmax:      0.86 m/s   +--------------+----------++ MV Vmean:     53.1 cm/s  +--------------+----------++ MV PHT:       60.61 msec +--------------+----------++ MV Decel Time:209 msec    +--------------+----------++ +--------------+----------++ MV E velocity:63.50 cm/s +--------------+----------++ MV A velocity:80.40 cm/s +--------------+----------++ MV E/A ratio: 0.79       +--------------+----------++  Hester Mates Electronically signed by Hester Mates Signature Date/Time: 07/06/2023/3:06:06 PM    Final    DG Chest Port 1 View  Result Date: 07/06/2023 CLINICAL DATA:  Status post aortic valve repair. EXAM: PORTABLE CHEST 1 VIEW COMPARISON:  July 05, 2023. FINDINGS: Stable cardiomediastinal silhouette. Endotracheal and nasogastric tubes have been removed. Stable Swan-Ganz catheter. Stable left-sided chest tube without definite pneumothorax. Mild bibasilar subsegmental atelectasis with possible small pleural effusions. IMPRESSION: Endotracheal  and nasogastric tubes have been removed. Stable left-sided chest tube without pneumothorax. Minimal bibasilar subsegmental atelectasis with possible small pleural effusions. Electronically Signed   By: Lupita Raider M.D.   On: 07/06/2023 09:55   DG Chest Port 1 View  Result Date: 07/05/2023 CLINICAL DATA:  Status post aortic valve replacement. EXAM: PORTABLE CHEST 1 VIEW COMPARISON:  07/03/2023 FINDINGS: There is a pulmonary arterial catheter within the main pulmonary artery. ET tube tip is above the carina. Enteric tube tip and side port are below the GE junction. Mediastinal drain and left chest tube noted. Prosthetic aortic valve identified. Tiny biapical pneumothoraces identified on the order of 4 mm on the left and 5 mm on the right. Atelectasis is identified in the left midlung. No airspace consolidation or interstitial edema. IMPRESSION: 1. Status post aortic valve replacement. 2. Support apparatus positioned as above. 3. Tiny biapical pneumothoraces. 4. Left midlung atelectasis. Electronically Signed   By: Signa Kell M.D.   On: 07/05/2023 16:57   EP STUDY  Result Date: 07/05/2023 See surgical note for  result.  CT Chest Wo Contrast  Result Date: 07/04/2023 CLINICAL DATA:  Preop planning. EXAM: CT CHEST WITHOUT CONTRAST TECHNIQUE: Multidetector CT imaging of the chest was performed following the standard protocol without IV contrast. RADIATION DOSE REDUCTION: This exam was performed according to the departmental dose-optimization program which includes automated exposure control, adjustment of the mA and/or kV according to patient size and/or use of iterative reconstruction technique. COMPARISON:  Heart CT dated April 23, 2023 FINDINGS: Cardiovascular: Normal heart size. No pericardial effusion. Severe left main and three-vessel coronary artery calcifications. Moderate aortic valve calcifications. Normal caliber thoracic aorta with calcified plaque, mild calcifications of the ascending aorta at the area of the sino-tubular junction and involving the posterior ascending aorta at the level of the right pulmonary artery. Severe calcified plaque of the aortic arch and descending thoracic aorta. Mediastinum/Nodes: Esophagus and thyroid are unremarkable. Calcified mediastinal lymph nodes, likely sequela of prior granulomatous infection. No enlarged lymph nodes seen in the chest. Lungs/Pleura: Central airways are patent. No consolidation, pleural effusion or pneumothorax. Upper Abdomen: Simple appearing right-sided renal cysts, no specific follow-up imaging is necessary. No acute abnormality. Musculoskeletal: No chest wall mass or suspicious bone lesions identified. IMPRESSION: 1. No acute abnormality seen in the chest. 2. Coronary artery calcifications and aortic Atherosclerosis (ICD10-I70.0). Electronically Signed   By: Allegra Lai M.D.   On: 07/04/2023 11:55   DG Chest 2 View  Result Date: 07/04/2023 CLINICAL DATA:  Preop evaluation for aortic valve replacement. Nonrheumatic aortic valve insufficiency. Coronary artery disease. EXAM: CHEST - 2 VIEW COMPARISON:  Chest radiographs 05/11/2014.  Cardiac CTA  04/23/2023. FINDINGS: The cardiomediastinal silhouette is within normal limits. Aortic atherosclerosis is noted. The lungs are mildly hypoinflated. No airspace consolidation, edema, pleural effusion, or pneumothorax is identified. No acute osseous abnormality is seen. IMPRESSION: No active cardiopulmonary disease. Electronically Signed   By: Sebastian Ache M.D.   On: 07/04/2023 11:33   VAS US DOPPLER PRE CABG  Result Date: 07/03/2023 PREOPERATIVE VASCULAR EVALUATION Patient Name:  ALDAHIR GORDILLO  Date of Exam:   07/03/2023 Medical Rec #: [id number]      Accession #:    [id number] Date of Birth: 11-May-1941       Patient Gender: M Patient Age:   35 years Exam Location:  [facility] Procedure:      VAS US DOPPLER PRE CABG Referring Phys: [name] --------------------------------------------------------------------------------  Performing Technologist: Ernestene Mention  RVT, RDMS  Examination Guidelines: A complete evaluation includes B-mode imaging, spectral Doppler, color Doppler, and power Doppler as needed of all accessible portions of each vessel. Bilateral testing is considered an integral part of a complete examination. Limited examinations for reoccurring indications may be performed as noted.  Right Carotid Findings: +----------+--------+--------+--------+----------+-----------------------------+           PSV cm/sEDV cm/sStenosisDescribe  Comments                      +----------+--------+--------+--------+----------+-----------------------------+ CCA Prox  82      0                         High resistant flow,tortuous  +----------+--------+--------+--------+----------+-----------------------------+ CCA Distal86      0                         High resisitant flow, intimal                                             thickening                    +----------+--------+--------+--------+----------+-----------------------------+ ICA Prox  69      10              hypoechoic                               +----------+--------+--------+--------+----------+-----------------------------+ ICA Distal68      9                                                       +----------+--------+--------+--------+----------+-----------------------------+ ECA       109     0                                                       +----------+--------+--------+--------+----------+-----------------------------+ +----------+--------+-------+--------+------------+           PSV cm/sEDV cmsDescribeArm Pressure +----------+--------+-------+--------+------------+ Subclavian151                                 +----------+--------+-------+--------+------------+ +---------+--------+--+--------+-+----------------------------+ VertebralPSV cm/s40EDV cm/s0Antegrade and High resistant +---------+--------+--+--------+-+----------------------------+ Left Carotid Findings: +----------+--------+--------+--------+---------------------+------------------+           PSV cm/sEDV cm/sStenosisDescribe             Comments           +----------+--------+--------+--------+---------------------+------------------+ CCA Prox  76                      heterogenous         High resistant  flow, intimal                                                             thickening         +----------+--------+--------+--------+---------------------+------------------+ CCA Distal64                                           High resistant                                                            flow               +----------+--------+--------+--------+---------------------+------------------+ ICA Prox  125     12      40-59%  hypoechoic and                                                            heterogenous                             +----------+--------+--------+--------+---------------------+------------------+ ICA Mid   103     11                                                      +----------+--------+--------+--------+---------------------+------------------+ ICA Distal57      9                                                       +----------+--------+--------+--------+---------------------+------------------+ ECA       155                                                             +----------+--------+--------+--------+---------------------+------------------+  +----------+--------+--------+--------+------------+ SubclavianPSV cm/sEDV cm/sDescribeArm Pressure +----------+--------+--------+--------+------------+           168                                  +----------+--------+--------+--------+------------+ +---------+--------+--+--------+-+----------------------------+ VertebralPSV cm/s37EDV cm/s6Antegrade and High resistant +---------+--------+--+--------+-+----------------------------+  ABI Findings: +------------------+-----+---------+ Rt Pressure (mmHg)IndexWaveform  +------------------+-----+---------+ 202                    triphasic +------------------+-----+---------+ 203  0.95 biphasic  +------------------+-----+---------+ 198               0.93 biphasic  +------------------+-----+---------+ 146               0.68 Normal    +------------------+-----+---------+ +------------------+-----+---------+ Lt Pressure (mmHg)IndexWaveform  +------------------+-----+---------+ 214                    triphasic +------------------+-----+---------+ 220               1.03 biphasic  +------------------+-----+---------+ 228               1.07 biphasic  +------------------+-----+---------+ 208               0.97 Normal    +------------------+-----+---------+  Right Doppler Findings: +--------+--------+---------+ Site    PressureDoppler    +--------+--------+---------+ [id number]     triphasic +--------+--------+---------+ Radial          triphasic +--------+--------+---------+ Ulnar           triphasic +--------+--------+---------+  Left Doppler Findings: +--------+--------+---------+ Site    PressureDoppler   +--------+--------+---------+ [id number]     triphasic +--------+--------+---------+ Radial          triphasic +--------+--------+---------+ Ulnar           triphasic +--------+--------+---------+   Summary: Right Carotid: The extracranial vessels were near-normal with only minimal wall                thickening or plaque. Left Carotid: Velocities in the left ICA are consistent with a 1-39% stenosis. Vertebrals:  Bilateral vertebral arteries demonstrate antegrade flow. Bilateral              vertebral arteries demonstrate high resistant flow. Subclavians: No evidence of stenosis seen in bilateral subclavian arteries.              Prolonged end diastolic flow seen bilaterally. Right ABI: Resting right ankle-brachial index is within normal range. The right toe-brachial index is normal. Left ABI: Resting left ankle-brachial index is within normal range. The left toe-brachial index is normal. Right Upper Extremity: Doppler waveforms remain within normal limits with right radial compression. Doppler waveforms remain within normal limits with right ulnar compression. Left Upper Extremity: Doppler waveform obliterate with left radial compression. Doppler waveforms remain within normal limits with left ulnar compression.  Electronically signed by Coral Else MD on 07/03/2023 at 9:28:20 PM.    Final      PHYSICAL EXAM  Temp:  [97.4 F (36.3 C)-98.5 F (36.9 C)] 98 F (36.7 C) (11/14 1101) Pulse Rate:  [30-97] 89 (11/14 0752) Resp:  [18-21] 20 (11/14 1101) BP: (116-187)/(62-99) 139/62 (11/14 1101) SpO2:  [96 %-100 %] 96 % (11/14 1101) Weight:  [69.3 kg] 69.3 kg (11/14 0622)  General - Well nourished, well  developed, in no apparent distress, slightly drowsy.  Ophthalmologic - fundi not visualized due to noncooperation.  Cardiovascular - Regular rhythm and rate.  Neuro - slightly drowsy but easily arousable with voice and cooperative with exam, eyes open, orientated to age, place, time and people. No aphasia, limited language output but following all simple commands. Able to name and repeat. Some confusion of left and right. No gaze palsy, tracking bilaterally, visual field full. No facial droop. Tongue midline. Bilateral UEs 4/5, no drift. Bilaterally LEs 3/5. Sensation symmetrical bilaterally, b/l FTN intact grossly, gait not tested.    ASSESSMENT/PLAN [name] is a 82 y.o. male with history of hypertension, hyperlipidemia, diabetes, colon cancer, prostate  cancer, history of stroke admitted for CABG and AVR surgery.  Postop, patient found to have a confusion and getting worse.  EEG no seizure.  MRI showed punctate embolic strokes.  No tPA given due to outside window.    Stroke: Multifocal bilateral punctate infarcts embolic pattern likely secondary to recent cardiac surgery CT 11/7 no acute abnormality MRI 11/13 showed multifocal punctate infarct including right ACA, left temporal lobe, right MCA/PCA/ACA, left frontal area. MRA unremarkable Carotid Doppler on markable 2D Echo EF 60 to 65%, no PFO LDL 33 HgbA1c 6.1 Lovenox for VTE prophylaxis clopidogrel 75 mg daily prior to admission, now on aspirin 81 mg daily and clopidogrel 75 mg daily DAPT.  Antiplatelet regimen per cardiology. 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  CT negative.  Carotid Doppler right ICA 50 to 69% stenosis. 04/2023 MRI showed left tiny infarct at frontal white matter, old left BG infarct.  MRA  head and neck negative. Follow-up with Dr. Pearlean Brownie at [facility], on Plavix.  Diabetes HgbA1c 6.1 goal < 7.0 Controlled CBG monitoring SSI DM education and close PCP follow up  Hypertension Stable Avoid low BP Long term BP goal normotensive  Hyperlipidemia Home meds: None LDL 33, goal < 70 No statin needed given advanced age and LDL at goal.  Other Stroke Risk Factors Advanced age  Other Active Problems History of colon cancer History of prostate cancer  Hospital day # 6  Neurology will sign off. Please call with questions. Pt will follow up with stroke clinic Dr. Pearlean Brownie at [facility] on 10/16/2023 as scheduled. Thanks for the consult.   Marvel Plan, MD PhD Stroke Neurology 07/11/2023 2:33 PM    To contact Stroke Continuity provider, please refer to [email or web address]. After hours, contact General Neurology

## 2023-07-11 NOTE — Plan of Care (Signed)
  Problem: Education: Goal: Knowledge of General Education information will improve Description: Including pain rating scale, medication(s)/side effects and non-pharmacologic comfort measures Outcome: Progressing   Problem: Health Behavior/Discharge Planning: Goal: Ability to manage health-related needs will improve Outcome: Progressing   Problem: Clinical Measurements: Goal: Ability to maintain clinical measurements within normal limits will improve Outcome: Progressing Goal: Will remain free from infection Outcome: Progressing Goal: Diagnostic test results will improve Outcome: Progressing Goal: Respiratory complications will improve Outcome: Progressing Goal: Cardiovascular complication will be avoided Outcome: Progressing   Problem: Activity: Goal: Risk for activity intolerance will decrease Outcome: Progressing   Problem: Nutrition: Goal: Adequate nutrition will be maintained Outcome: Progressing   Problem: Coping: Goal: Level of anxiety will decrease Outcome: Progressing   Problem: Elimination: Goal: Will not experience complications related to bowel motility Outcome: Progressing Goal: Will not experience complications related to urinary retention Outcome: Progressing   Problem: Pain Management: Goal: General experience of comfort will improve Outcome: Progressing   Problem: Safety: Goal: Ability to remain free from injury will improve Outcome: Progressing   Problem: Skin Integrity: Goal: Risk for impaired skin integrity will decrease Outcome: Progressing   Problem: Education: Goal: Will demonstrate proper wound care and an understanding of methods to prevent future damage Outcome: Progressing Goal: Knowledge of disease or condition will improve Outcome: Progressing Goal: Knowledge of the prescribed therapeutic regimen will improve Outcome: Progressing Goal: Individualized Educational Video(s) Outcome: Progressing   Problem: Activity: Goal: Risk for  activity intolerance will decrease Outcome: Progressing   Problem: Cardiac: Goal: Will achieve and/or maintain hemodynamic stability Outcome: Progressing   Problem: Clinical Measurements: Goal: Postoperative complications will be avoided or minimized Outcome: Progressing   Problem: Respiratory: Goal: Respiratory status will improve Outcome: Progressing   Problem: Skin Integrity: Goal: Wound healing without signs and symptoms of infection Outcome: Progressing Goal: Risk for impaired skin integrity will decrease Outcome: Progressing   Problem: Urinary Elimination: Goal: Ability to achieve and maintain adequate renal perfusion and functioning will improve Outcome: Progressing

## 2023-07-12 ENCOUNTER — Telehealth: Payer: Self-pay | Admitting: Neurology

## 2023-07-12 ENCOUNTER — Other Ambulatory Visit (HOSPITAL_COMMUNITY): Payer: Self-pay

## 2023-07-12 ENCOUNTER — Other Ambulatory Visit: Payer: Self-pay | Admitting: Cardiology

## 2023-07-12 DIAGNOSIS — Z952 Presence of prosthetic heart valve: Secondary | ICD-10-CM

## 2023-07-12 LAB — GLUCOSE, CAPILLARY: Glucose-Capillary: 98 mg/dL (ref 70–99)

## 2023-07-12 MED ORDER — DILTIAZEM HCL ER COATED BEADS 120 MG PO CP24
120.0000 mg | ORAL_CAPSULE | Freq: Every day | ORAL | Status: DC
  Administered 2023-07-12 – 2023-07-15 (×4): 120 mg via ORAL
  Filled 2023-07-12 (×4): qty 1

## 2023-07-12 MED ORDER — HYDRALAZINE HCL 20 MG/ML IJ SOLN: 10.0000 mg | Freq: Four times a day (QID) | INTRAMUSCULAR | Status: DC | PRN

## 2023-07-12 MED ORDER — DOCUSATE SODIUM 100 MG PO CAPS: 200.0000 mg | ORAL_CAPSULE | Freq: Every day | ORAL | Status: DC | PRN

## 2023-07-12 MED ORDER — APIXABAN 5 MG PO TABS
5.0000 mg | ORAL_TABLET | Freq: Two times a day (BID) | ORAL | Status: DC
  Administered 2023-07-12 – 2023-07-16 (×9): 5 mg via ORAL
  Filled 2023-07-12 (×9): qty 1

## 2023-07-12 NOTE — Plan of Care (Signed)

## 2023-07-12 NOTE — TOC Benefit Eligibility Note (Signed)
Patient Product/process development scientist completed.    The patient is insured through [employer] [employer] [facility] [employer] Patient has Medicare and is not eligible for a copay card, but may be able to apply for patient assistance, if available.    Ran test claim for Eliquis 5 mg and the current 30 day co-pay is $45.00.   This test claim was processed through [facility]- copay amounts may vary at other pharmacies due to pharmacy/plan contracts, or as the patient moves through the different stages of their insurance plan.     Roland Earl, CPHT Pharmacy Technician III Certified Patient Advocate [facility] Patient Advocate Team Direct Number: [phone]  Fax: [phone]

## 2023-07-12 NOTE — Telephone Encounter (Signed)
[place] [facility]([place]) calling schedule pt a earlier appt with Dr. Pennie Rushing.   Pt is currently in hospital and will be discharged Monday to a skilled nursing facility. Would like appt scheduled after discharged from rehabilitation. Would like a call back.

## 2023-07-12 NOTE — TOC Progression Note (Signed)
Transition of Care [facility]) - Progression Note    Patient Details  Name: JEANNE GAUNTLETT MRN: [id number] Date of Birth: 02-15-1941  Transition of [facility]) CM/SW Contact  Eduard Roux, [place] Phone Number: 07/12/2023, 10:38 AM  Clinical Narrative:     CSW spoke with patient's spouse- informed [facility] has not offered. CSW contacted [facility] (her next choice) and accepted bed offer. Informed anticipated d/c beginning of next week , if medically stable & insurance approval received. SNF will start the authorization process.  TOC will continue to follow and assist with discharge planning.  Antony Blackbird, MSW, LCSW Clinical Social Worker      Expected Discharge Plan: Home w Home Health Services Barriers to Discharge: Continued Medical Work up  Expected Discharge Plan and Services   Discharge Planning Services: CM Consult Post Acute Care Choice: IP Rehab Living arrangements for the past 2 months: Single Family Home                                       Social Determinants of Health (SDOH) Interventions SDOH Screenings   Food Insecurity: No Food Insecurity (07/08/2023)  Housing: Low Risk  (07/08/2023)  Transportation Needs: No Transportation Needs (07/08/2023)  Utilities: Not At Risk (07/08/2023)  Tobacco Use: Low Risk  (07/05/2023)    Readmission Risk Interventions     No data to display

## 2023-07-12 NOTE — Progress Notes (Signed)
   Echocardiogram ordered at request of CT surgery following CABG and AVR.   Perlie Gold, PA-C

## 2023-07-12 NOTE — Progress Notes (Signed)
Pt just ambulated with PT. In recliner. Discussed with pt and wife IS (pt currently 1100 ml max), sternal precautions, mobility/exercise, and CRPII. Pt tangential and confused to location at times. Wife very receptive. Gave [facility] booklet to review and Move in the Tube sheet for SNF. Will refer to [facility]. Will f/u for ambulation. Gave d/c video to view. [id number] Ethelda Chick BS, ACSM-CEP 07/12/2023 2:45 PM

## 2023-07-12 NOTE — Telephone Encounter (Signed)
Noted the patient is scheduled for feb apt with Dr Pearlean Brownie which is likely the soonest he has right now. Pt can be placed on wait list.

## 2023-07-12 NOTE — Progress Notes (Signed)
Physical Therapy Treatment Patient Details Name: Bruce Nunez MRN: [id number] DOB: 22-Jul-1941 Today's Date: 07/12/2023   History of Present Illness Pt is a 82yo male who was admitted for dramatic decline in function due to DOE and fatigue. 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 PMH/PSH: aortic stenosis, portstate ca, GERD, s/p R inguinal hernia repair 9/2-15, HTN, HLD, asthma    PT Comments  Pt continues to slowly progress towards goals. Brief was donned at beginning of session due to incontinence when standing; RN and Runner, broadcasting/film/video notified. Pt is min A - Mod A for sit to stand with pt able to stand from higher surfaces much better than lower. Pt was able to increase gait distance this session and after ~45 ft started to report lightheadedness; heavy cueing required in order to address safety and maintain sternal precautions throughout session. Due to pt current functional status, home set up and available assistance at home recommending skilled physical therapy services in order to decrease risk for falls, injury, to protect integrity of surgical site and prevent re-hospitalization.     If plan is discharge home, recommend the following: Supervision due to cognitive status;Help with stairs or ramp for entrance;Direct supervision/assist for medications management;Assist for transportation;A little help with walking and/or transfers   Can travel by private vehicle     No  Equipment Recommendations  Other (comment) (defer to post acute)       Precautions / Restrictions Precautions Precautions: Fall;Sternal Precaution Comments: Pt can remember no pushing/no pulling but does not follow precautions with functional mobility Restrictions Weight Bearing Restrictions: No RUE Weight Bearing: Non weight bearing LUE Weight Bearing: Non weight bearing Other Position/Activity Restrictions: sternal precautions     Mobility  Bed Mobility Overal bed  mobility: Needs Assistance Bed Mobility: Sidelying to Sit Rolling: Min assist Sidelying to sit: Min assist       General bed mobility comments: Mod cueing to prevent pulling on bed rail    Transfers Overall transfer level: Needs assistance Equipment used: None Transfers: Sit to/from Stand Sit to Stand: Min assist, Mod assist           General transfer comment: Min A from EOB for standing with heavy cueing for body mechanics and to prevent pt pulling up from RW or pushing up from the bed. Mod A from lower surface    Ambulation/Gait Ambulation/Gait assistance: Min assist Gait Distance (Feet): 60 Feet Assistive device: Rolling walker (2 wheels) Gait Pattern/deviations: Step-through pattern, Decreased stride length, Shuffle Gait velocity: decreased Gait velocity interpretation: <1.8 ft/sec, indicate of risk for recurrent falls   General Gait Details: shuffling gait that pt has difficulty correcting. Pt requires frequent verbal cues to bring self closer to AD.      Balance Overall balance assessment: Needs assistance Sitting-balance support: Feet supported, No upper extremity supported Sitting balance-Leahy Scale: Fair Sitting balance - Comments: Supervision for safety   Standing balance support: Bilateral upper extremity supported Standing balance-Leahy Scale: Poor Standing balance comment: intermittently reliant on external support to prevent falls.        Cognition Arousal: Alert Behavior During Therapy: WFL for tasks assessed/performed, Impulsive Overall Cognitive Status: Impaired/Different from baseline Area of Impairment: Following commands, Safety/judgement, Awareness         Orientation Level: Disoriented to, Place, Time, Situation   Memory: Decreased recall of precautions, Decreased short-term memory Following Commands: Follows one step commands with increased time, Follows one step  commands inconsistently Safety/Judgement: Decreased awareness of  safety, Decreased awareness of deficits   Problem Solving: Difficulty sequencing, Requires verbal cues, Requires tactile cues             General Comments General comments (skin integrity, edema, etc.): Pt spouse present throughout session and supportive.      Pertinent Vitals/Pain Pain Assessment Pain Assessment: No/denies pain    Home Living     Available Help at Discharge: Family;Available 24 hours/day Type of Home: House          PT Goals (current goals can now be found in the care plan section) Acute Rehab PT Goals Patient Stated Goal: didn't state PT Goal Formulation: Patient unable to participate in goal setting Time For Goal Achievement: 07/23/23 Potential to Achieve Goals: Good Progress towards PT goals: Progressing toward goals    Frequency    Min 1X/week      PT Plan  Continue with current POC       AM-PAC PT "6 Clicks" Mobility   Outcome Measure  Help needed turning from your back to your side while in a flat bed without using bedrails?: A Little Help needed moving from lying on your back to sitting on the side of a flat bed without using bedrails?: A Little Help needed moving to and from a bed to a chair (including a wheelchair)?: A Little Help needed standing up from a chair using your arms (e.g., wheelchair or bedside chair)?: A Little Help needed to walk in hospital room?: A Little Help needed climbing 3-5 steps with a railing? : A Lot 6 Click Score: 17    End of Session Equipment Utilized During Treatment: Gait belt Activity Tolerance: Patient tolerated treatment well Patient left: in bed;with call bell/phone within reach;with bed alarm set Nurse Communication: Mobility status PT Visit Diagnosis: Unsteadiness on feet (R26.81);Muscle weakness (generalized) (M62.81);Difficulty in walking, not elsewhere classified (R26.2)     Time: 1914-7829 PT Time Calculation (min) (ACUTE ONLY): 27 min  Charges:    $Gait Training: 8-22  mins $Therapeutic Activity: 8-22 mins PT General Charges $$ ACUTE PT VISIT: 1 Visit                     Harrel Carina, DPT, CLT  Acute Rehabilitation Services Office: [phone] (Secure chat preferred)    Claudia Desanctis 07/12/2023, 2:59 PM

## 2023-07-12 NOTE — Evaluation (Signed)
Speech Language Pathology Evaluation Patient Details Name: Bruce Nunez MRN: [id number] DOB: 05/21/1941 Today's Date: 07/12/2023 Time: 1040-1106 SLP Time Calculation (min) (ACUTE ONLY): 26 min  Problem List:  Patient Active Problem List   Diagnosis Date Noted   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   Endotracheally intubated 07/05/2023   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   Stroke (HCC) 03/28/2022   PONV (postoperative nausea and vomiting)    Inguinal hernia    Hypertension    Hyperlipidemia    Hx of transfusion    Hx of peptic ulcer    Hx of nonmelanoma skin cancer    Hx of colon cancer, stage I    History of kidney stones    Hemorrhoids    Heart murmur    GERD (gastroesophageal reflux disease)    Frequency of urination    Diabetes mellitus without complication (HCC)    Complication of anesthesia    Asthma    Anemia    Aortic stenosis 04/29/2019   Aortic regurgitation 03/12/2017   Essential hypertension 03/12/2017   Right inguinal hernia 05/08/2016   S/P right inguinal hernia repair Sept 2015 05/17/2014   Prostate cancer (HCC) 08/15/2012   Past Medical History:  Past Medical History:  Diagnosis Date   Anemia    recent iron supplement   Aortic regurgitation 03/12/2017   Aortic stenosis 04/29/2019   Asthma    childhood-   2013 bronchitis w/wheezing   Complication of anesthesia    " ether made me sick" - "woke up during colonoscopy"   Diabetes mellitus without complication (HCC)    Essential hypertension 03/12/2017   Frequency of urination    GERD (gastroesophageal reflux disease)    occasional   Heart murmur    Hemorrhoids    History of kidney stones    Hx of colon cancer, stage I    Hx of nonmelanoma skin cancer    Hx of peptic ulcer    as teenager   Hx of transfusion    Hyperlipidemia    Hypertension    Inguinal hernia    PONV (postoperative nausea and vomiting)    only when had Ether as Anesthesia drug   Prostate  cancer (HCC) 08/15/2012   also Hx colon cancer / Hx skin cancer   Right inguinal hernia 05/08/2016   S/P right inguinal hernia repair Sept 2015 05/17/2014   Recurrent right indirect  Hernia treated with robotic TAPP repair with enterolysis Sept 2017   Stroke [facility]) 03/28/2022   Past Surgical History:  Past Surgical History:  Procedure Laterality Date   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   Procedure: AORTIC VALVE REPLACEMENT WITH INSPIRIS AORTIC VALVE;  Surgeon: [name], MD;  Location: [facility] OR;  Service: Open Heart Surgery;  Laterality: N/A;   cataract surgery  2009   CORONARY ARTERY BYPASS GRAFT N/A 07/05/2023   Procedure: CORONARY ARTERY BYPASS GRAFTING, USING LEFT INTERNAL MAMMARY ARTERY;  Surgeon: [name], MD;  Location: [facility] OR;  Service: Open Heart Surgery;  Laterality: N/A;   CYSTOSCOPY     INGUINAL HERNIA REPAIR Right 05/17/2014   Procedure: RIGHT INGUINAL HERNIA REPAIR ;  Surgeon: Wenda Low, MD;  Location: [facility] ORS;  Service: General;  Laterality: Right;  With MESH   KNEE SURGERY Right 2012   LEFT HEART CATH AND CORONARY ANGIOGRAPHY N/A 05/03/2023   Procedure: LEFT HEART CATH AND CORONARY ANGIOGRAPHY;  Surgeon: Tonny Bollman, MD;  Location: [facility] INVASIVE CV LAB;  Service: Cardiovascular;  Laterality: N/A;   PARTIAL COLECTOMY  2005   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   RADIOACTIVE SEED IMPLANT N/A 11/24/2012   Procedure: RADIOACTIVE SEED IMPLANT;  Surgeon: Valetta Fuller, MD;  Location: [facility];  Service: Urology;  Laterality: N/A;   71seeds implanted  no seeds found in bladder   TEE WITHOUT CARDIOVERSION N/A 07/05/2023   Procedure: TRANSESOPHAGEAL ECHOCARDIOGRAM;  Surgeon: [name], MD;  Location: [facility] OR;  Service: Open Heart Surgery;  Laterality: N/A;   THROAT SURGERY  1940's   as child (age 69)growth that created fissure/corrective surgery   TONSILLECTOMY     age 14   XI ROBOTIC ASSISTED INGUINAL HERNIA REPAIR WITH MESH Right  05/08/2016   Procedure: XI ROBOTIC ASSISTED REPAIR OF RECURRENT RIGHT INGUINAL HERNIA;  Surgeon: Luretha Murphy, MD;  Location: [facility] ORS;  Service: General;  Laterality: Right;  With MESH   HPI:  Bruce Nunez is an 82 y.o. 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 Neurology was consulted 11/13 due to increased confusion.      MRI brain was ordered that shows punctate foci of acute infarction in the left temporal occipital  junction and right parietal subcortical white matter, an acute cortical infarct along the right superior frontal gyrus and additional foci of DWI hyperintensity along the left pre and  postcentral gyri, likely reflecting subacute infarcts.   Assessment / Plan / Recommendation Clinical Impression  Cognitive-linguistic evaluation complete. Confusion improving based on interaction during swallow evaluation previous date. 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 Patient will benefit from continued f/u for cognitive-linguistic treatment with focus on cognitive function although can also address compensatory strategies for existing dysarthria.    SLP Assessment  SLP Recommendation/Assessment: Patient needs continued Speech Lanaguage Pathology Services SLP Visit Diagnosis: Frontal lobe and executive function deficit Frontal lobe and executive function deficit following: Cerebral infarction    Recommendations for follow up therapy are one component of a multi-disciplinary discharge planning process, led by the attending physician.  Recommendations may be updated based on patient status, additional functional  criteria and insurance authorization.    Follow Up Recommendations  Skilled nursing-short term rehab (<3 hours/day)    Assistance Recommended at Discharge  Frequent or constant Supervision/Assistance     Frequency and Duration min 2x/week  2 weeks      SLP Evaluation Cognition  Overall Cognitive Status: Impaired/Different from baseline Arousal/Alertness: Awake/alert Orientation Level: Oriented to person;Oriented to place;Oriented to time;Oriented to situation Attention: Sustained Sustained Attention: Impaired Sustained Attention Impairment: Functional complex;Verbal complex Memory: Impaired Awareness: Appears intact Problem Solving: Appears intact Executive Function: Reasoning Reasoning: Impaired Reasoning Impairment: Functional complex (high level) Safety/Judgment: Appears intact       Comprehension  Auditory Comprehension Overall Auditory Comprehension: Appears within functional limits for tasks assessed Visual Recognition/Discrimination Discrimination: Within Function Limits Reading Comprehension Reading Status: Not tested    Expression Expression Primary Mode of Expression: Verbal Verbal Expression Overall Verbal Expression: Appears within functional limits for tasks assessed   Oral / Motor  Oral Motor/Sensory Function Overall Oral Motor/Sensory Function: Within functional limits Motor Speech  Overall Motor Speech: Impaired at baseline Respiration: Within functional limits Phonation: Normal Resonance: Within functional limits Articulation: Impaired Level of Impairment: Conversation Intelligibility: Intelligible Motor Planning: Witnin functional limits Interfering Components:  (xerostomia) Effective Techniques: Slow rate;Over-articulate           Alaija Ruble MA, CCC-SLP  Nichele Slawson Meryl 07/12/2023, 11:13 AM

## 2023-07-12 NOTE — Progress Notes (Addendum)
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 Patient knows he is confused and feels like he has been moved every day. Pt had diarrhea yesterday.  Objective: Vital signs in last 24 hours: Temp:  [97.4 F (36.3 C)-98.7 F (37.1 C)] 98.7 F (37.1 C) (11/15 0319) Pulse Rate:  [89-96] 96 (11/14 1932) Cardiac Rhythm: Heart block (11/14 1922) Resp:  [13-21] 21 (11/15 0626) BP: (118-187)/(60-86) 118/60 (11/15 0429) SpO2:  [96 %-100 %] 96 % (11/14 1607) Weight:  [68.9 kg] 68.9 kg (11/15 0626)  Hemodynamic parameters for last 24 hours:    Intake/Output from previous day: 11/14 0701 - 11/15 0700 In: -  Out: 750 [Urine:750] Intake/Output this shift: No intake/output data recorded.  General appearance: alert, cooperative, drowsy, and no distress Neurologic: dysarthria, no acute weakness, no acute abnormalities Heart: irregularly irregular rhythm Lungs: clear to auscultation bilaterally Abdomen: soft, non-tender; bowel sounds normal; no masses,  no organomegaly Extremities: extremities normal, atraumatic, no cyanosis or edema Wound: Clean and dry without signs of infection  Lab Results: Recent Labs    07/10/23 0354  WBC 6.5  HGB 9.7*  HCT 29.8*  PLT 143*   BMET:  Recent Labs    07/10/23 0354 07/11/23 0437  NA 135 133*  K 3.6 3.8  CL 102 102  CO2 25 23  GLUCOSE 114* 116*  BUN 23 18  CREATININE 1.21 1.28*  CALCIUM 8.5* 8.4*    PT/INR: No results for input(s): "LABPROT", "INR" in the last 72 hours. 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 Patient continues with intermittent periods of confusion but knows he is confused. On Seroquel 25mg  at night for sundowning. No sitter this AM. Holding narcotics, on scheduled Tylenol. Brain MRI showed acute left temporal/occipital, right parietal and right frontal infarcts, MRA was unremarkable. Neurology following. On Plavix and ASA. SLP evaluation without acute abnormality, will continue diet.    CV: On Lopressor 100mg  BID. SBP 118-175. Atrial fibrillation with controlled ventricular rate this AM, HR 80s. He is allergic to contrast so will not start Amiodarone. Will start diltiazem and transition Plavix to Eliquis for stroke prevention as discussed with Dr. Leafy Ro. On hydralazine PRN for HTN.   Pulm: Saturating well on RA. Continue nebs, Mucinex PRN, and flutter valve. Encourage IS and ambulation. Not using IS and flutter valve much due to confusion.    GI: Not eating much, denies nausea and vomiting. On ensure shakes but still not eating or drinking much. Drank 2 ensure shakes yesterday which is improved from prior. Discussed the importance of nutrition. SLP consult without abnormality, regular heart  healthy diet continued. Diarrhea yesterday, will d/c stool softeners.    Endo: A1C 6.1, no home meds. Likely prediabetic, will need close outpatient follow up. CBGs controlled, SSI and CBGs discontinued. Hypothyroid, on Levothyroxine    Renal: Cr 1.28 yesterday. +5lbs, on Lasix and potassium  as recommended by Dr. Leafy Ro. Will check BMET tomorrow AM. On home Flomax.   DVT Prophylaxis: Lovenox    Deconditioning: PT/OT recommending SNF     Dispo: Working on SNF placement, patient needs to be without a Comptroller for 48 hours before transferring to SNF. Hopefully can d/c to SNF early next week.   LOS: 7 days    Jenny Reichmann, PA-C 07/12/2023

## 2023-07-13 LAB — BASIC METABOLIC PANEL
Anion gap: 14 (ref 5–15)
BUN: 21 mg/dL (ref 8–23)
CO2: 19 mmol/L — ABNORMAL LOW (ref 22–32)
Calcium: 8.6 mg/dL — ABNORMAL LOW (ref 8.9–10.3)
Chloride: 102 mmol/L (ref 98–111)
Creatinine, Ser: 1.19 mg/dL (ref 0.61–1.24)
GFR, Estimated: >60 mL/min (ref >60)
Glucose, Bld: 94 mg/dL (ref 70–99)
Potassium: 3.6 mmol/L (ref 3.5–5.1)
Sodium: 135 mmol/L (ref 135–145)

## 2023-07-13 NOTE — Plan of Care (Signed)
  Problem: Education: Goal: Knowledge of General Education information will improve Description: Including pain rating scale, medication(s)/side effects and non-pharmacologic comfort measures Outcome: Progressing   Problem: Health Behavior/Discharge Planning: Goal: Ability to manage health-related needs will improve Outcome: Progressing   Problem: Clinical Measurements: Goal: Will remain free from infection Outcome: Progressing Goal: Respiratory complications will improve Outcome: Progressing   Problem: Activity: Goal: Risk for activity intolerance will decrease Outcome: Progressing   Problem: Nutrition: Goal: Adequate nutrition will be maintained Outcome: Progressing   Problem: Elimination: Goal: Will not experience complications related to bowel motility Outcome: Progressing Goal: Will not experience complications related to urinary retention Outcome: Progressing   Problem: Pain Management: Goal: General experience of comfort will improve Outcome: Progressing   Problem: Safety: Goal: Ability to remain free from injury will improve Outcome: Progressing   Problem: Skin Integrity: Goal: Risk for impaired skin integrity will decrease Outcome: Progressing

## 2023-07-13 NOTE — Progress Notes (Signed)
Mobility Specialist Progress Note:   07/13/23 1000  Mobility  Activity Ambulated with assistance in room;Transferred from bed to chair  Level of Assistance Minimal assist, patient does 75% or more  Assistive Device Front wheel walker  Distance Ambulated (ft) 15 ft  Activity Response Tolerated well  Mobility Referral Yes  $Mobility charge 1 Mobility  Mobility Specialist Start Time (ACUTE ONLY) 1000  Mobility Specialist Stop Time (ACUTE ONLY) 1020  Mobility Specialist Time Calculation (min) (ACUTE ONLY) 20 min   Pt agreeable to mobility session. Session limited by urine incontinence. Required heavy minA to stand from EOB while maintaining sternal precautions. Pt opted to ambulate around bed to chair to sit up for breakfast. Will return later today to ambulate if schedule allows. Alarm on.  Addison Lank Mobility Specialist Please contact via SecureChat or  Rehab office at [phone]

## 2023-07-13 NOTE — Progress Notes (Addendum)
      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  He does know he has at [facility] this morning.  Objective: Vital signs in last 24 hours: Temp:  [97.6 F (36.4 C)-98.7 F (37.1 C)] 98.4 F (36.9 C) (11/16 0441) Pulse Rate:  [60-98] 79 (11/16 0441) Cardiac Rhythm: Heart block (11/15 1907) Resp:  [16-24] 16 (11/16 0445) BP: (115-152)/(56-91) 120/91 (11/16 0441) SpO2:  [97 %-100 %] 98 % (11/16 0750) Weight:  [69.9 kg] 69.9 kg (11/16 0445)  General appearance: alert, cooperative, and no distress Heart: regular rate and rhythm Lungs: clear to auscultation bilaterally Abdomen: soft, non-tender; bowel sounds normal; no masses,  no organomegaly Extremities: edema trace Wound: clean and dry  Lab Results: No results for input(s): "WBC", "HGB", "HCT", "PLT" in the last 72 hours. BMET:  Recent Labs    07/11/23 0437 07/13/23 0326  NA 133* 135  K 3.8 3.6  CL 102 102  CO2 23 19*  GLUCOSE 116* 94  BUN 18 21  CREATININE 1.28* 1.19  CALCIUM 8.4* 8.6*    PT/INR: No results for input(s): "LABPROT", "INR" in the last 72 hours. 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 Fib yesterday with controlled rate,  he is in NSR this morning- continue Lopressor, Cardizem.. BP is also much improved.. his plavix has been stopped and he has been transitioned to Eliquis for stroke prevention Pulm- off oxygen, no acute issues, continue IS.Marland Kitchen  Renal- Creatinine improving down to 1.19, I suspect weight is not accurate as states he gained 4 lbs yesterday, he has minimal to no edema on exam.. will continue Lasix, potassium for now Neuro- delirium, previous stroke with baseline cognitive deficits.. continue to reorient as needed, but overall improving Deconditioning- PT/OT recs SNF.Marland Kitchen has bed offer at [facility] [place]    LOS: 8 days    Lowella Dandy, PA-C 07/13/2023   Chart reviewed, patient examined, agree with above. Sinus rhythm this am on Lopressor and Cardizem. Planning SNF early next week.

## 2023-07-14 NOTE — Progress Notes (Addendum)
      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  He states he did not rest well.  He is also discouraged about not being able to ambulate very far as he got very short of breath.  He is more oriented today.  Objective: Vital signs in last 24 hours: Temp:  [97.7 F (36.5 C)-98.2 F (36.8 C)] 98.1 F (36.7 C) (11/17 0315) Pulse Rate:  [44-90] 44 (11/17 0732) Cardiac Rhythm: Atrial fibrillation (11/17 0550) Resp:  [16-22] 22 (11/17 0732) BP: (109-142)/(51-81) 122/51 (11/17 0315) SpO2:  [94 %-100 %] 94 % (11/17 0732) Weight:  [66.2 kg] 66.2 kg (11/17 0315)  Intake/Output from previous day: 11/16 0701 - 11/17 0700 In: 480 [P.O.:480] Out: 800 [Urine:800]  General appearance: alert, cooperative, and no distress Heart: regular rate and rhythm Lungs: clear to auscultation bilaterally Abdomen: soft, non-tender; bowel sounds normal; no masses,  no organomegaly Extremities: edema none present Wound: clean and dry  Lab Results: No results for input(s): "WBC", "HGB", "HCT", "PLT" in the last 72 hours. BMET:  Recent Labs    07/13/23 0326  NA 135  K 3.6  CL 102  CO2 19*  GLUCOSE 94  BUN 21  CREATININE 1.19  CALCIUM 8.6*    PT/INR: No results for input(s): "LABPROT", "INR" in the last 72 hours. ABG    Component Value Date/Time   PHART 7.363 07/05/2023 2208   HCO3 21.2 07/05/2023 2208   TCO2 22 07/05/2023 2208   ACIDBASEDEF 4.0 (H) 07/05/2023 2208   O2SAT 90 07/05/2023 2208   CBG (last 3)  Recent Labs    07/11/23 1606 07/11/23 2120 07/12/23 0619  GLUCAP 142* 112* 98    Assessment/Plan: S/P Procedure(s) (LRB): AORTIC VALVE REPLACEMENT WITH INSPIRIS AORTIC  VALVE (N/A) CORONARY ARTERY BYPASS GRAFTING, USING LEFT INTERNAL MAMMARY ARTERY (N/A) TRANSESOPHAGEAL ECHOCARDIOGRAM (N/A)  CV- PAF, maintaining NSR with PVCs- continue Lopressor at 100 mg BID, Cardizem, Eliquis Pulm- no acute issues, not on oxygen continue IS Renal- weight is at baseline, no edema on exam continue Lasix, potassium for now Neuro- delirium improved, he has some baseline cognitive deficits with previous stroke.. monitor closely Deconditioning- severe, PT recs SNF  Dispo- patient stable, ready for d/c to SNF in AM if bed is available   LOS: 9 days    Lowella Dandy, PA-C 07/14/2023   Chart reviewed, patient examined, agree with above. Rhythm looks sinus with bigeminy on Lopressor and Cardizem. Planning SNF tomorrow if no changes.

## 2023-07-14 NOTE — Plan of Care (Signed)
POC is progressing.

## 2023-07-14 NOTE — Plan of Care (Signed)
  Problem: Education: Goal: Knowledge of General Education information will improve Description: Including pain rating scale, medication(s)/side effects and non-pharmacologic comfort measures Outcome: Progressing   Problem: Health Behavior/Discharge Planning: Goal: Ability to manage health-related needs will improve Outcome: Progressing   Problem: Clinical Measurements: Goal: Will remain free from infection Outcome: Progressing Goal: Respiratory complications will improve Outcome: Progressing Goal: Cardiovascular complication will be avoided Outcome: Progressing   Problem: Activity: Goal: Risk for activity intolerance will decrease Outcome: Progressing   Problem: Nutrition: Goal: Adequate nutrition will be maintained Outcome: Progressing   Problem: Elimination: Goal: Will not experience complications related to urinary retention Outcome: Progressing   Problem: Pain Management: Goal: General experience of comfort will improve Outcome: Progressing   Problem: Safety: Goal: Ability to remain free from injury will improve Outcome: Progressing   Problem: Skin Integrity: Goal: Risk for impaired skin integrity will decrease Outcome: Progressing   Problem: Education: Goal: Knowledge of disease or condition will improve Outcome: Progressing

## 2023-07-15 DIAGNOSIS — I442 Atrioventricular block, complete: Secondary | ICD-10-CM

## 2023-07-15 MED ORDER — METOPROLOL TARTRATE 100 MG PO TABS: 100.0000 mg | ORAL_TABLET | Freq: Two times a day (BID) | ORAL | Status: DC

## 2023-07-15 MED ORDER — ENSURE ENLIVE PO LIQD: 237.0000 mL | Freq: Three times a day (TID) | ORAL | Status: DC

## 2023-07-15 MED ORDER — APIXABAN 5 MG PO TABS: 5.0000 mg | ORAL_TABLET | Freq: Two times a day (BID) | ORAL | Status: DC

## 2023-07-15 MED ORDER — DILTIAZEM HCL ER COATED BEADS 120 MG PO CP24: 120.0000 mg | ORAL_CAPSULE | Freq: Every day | ORAL | Status: DC

## 2023-07-15 MED ORDER — METHOCARBAMOL 500 MG PO TABS
500.0000 mg | ORAL_TABLET | Freq: Four times a day (QID) | ORAL | Status: DC | PRN
  Administered 2023-07-15 – 2023-07-19 (×3): 500 mg via ORAL
  Filled 2023-07-15 (×3): qty 1

## 2023-07-15 MED ORDER — POTASSIUM CHLORIDE CRYS ER 20 MEQ PO TBCR: 20.0000 meq | EXTENDED_RELEASE_TABLET | Freq: Every day | ORAL | Status: DC

## 2023-07-15 MED ORDER — FUROSEMIDE 40 MG PO TABS: 40.0000 mg | ORAL_TABLET | Freq: Every day | ORAL | Status: DC

## 2023-07-15 MED ORDER — ASPIRIN 81 MG PO CHEW: 81.0000 mg | CHEWABLE_TABLET | Freq: Every day | ORAL | Status: DC

## 2023-07-15 NOTE — TOC Progression Note (Signed)
Transition of Care [facility] - Progression Note    Patient Details  Name: Bruce Nunez MRN: [id number] Date of Birth: Jan 13, 1941  Transition of Care [facility]) CM/SW Contact  Eduard Roux, [place] Phone Number: 07/15/2023, 3:34 PM  Clinical Narrative:     Informed [facility] patient will NOT d/c today/ PTAR cancelled.   TOC will continue to follow and assist with discharge planning.  Antony Blackbird, MSW, LCSW Clinical Social Worker    Expected Discharge Plan: Home w Home Health Services Barriers to Discharge: Barriers Resolved  Expected Discharge Plan and Services   Discharge Planning Services: CM Consult Post Acute Care Choice: IP Rehab Living arrangements for the past 2 months: Single Family Home Expected Discharge Date: 07/15/23                                     Social Determinants of Health (SDOH) Interventions SDOH Screenings   Food Insecurity: No Food Insecurity (07/08/2023)  Housing: Low Risk  (07/08/2023)  Transportation Needs: No Transportation Needs (07/08/2023)  Utilities: Not At Risk (07/08/2023)  Tobacco Use: Low Risk  (07/05/2023)    Readmission Risk Interventions     No data to display

## 2023-07-15 NOTE — Progress Notes (Signed)
Mobility Specialist Progress Note:   07/15/23 1211  Mobility  Activity Transferred from chair to bed  Level of Assistance Contact guard assist, steadying assist  Assistive Device Front wheel walker  Distance Ambulated (ft) 3 ft  RUE Weight Bearing NWB  LUE Weight Bearing NWB  Activity Response Tolerated well  Mobility Referral Yes  $Mobility charge 1 Mobility  Mobility Specialist Start Time (ACUTE ONLY) 1200  Mobility Specialist Stop Time (ACUTE ONLY) 1212  Mobility Specialist Time Calculation (min) (ACUTE ONLY) 12 min   Pt received in chair, requesting to transfer back to bed d/t lower back pain. CG to stand from chair. Pt reaching out for support after power up d/t fear of falling back upon standing. Pt returned to bed with NT present in room.  [name]  Mobility Specialist Please contact via [employer] office at [phone]

## 2023-07-15 NOTE — TOC Progression Note (Signed)
Transition of Care [facility]) - Progression Note    Patient Details  Name: JASAAN THIGPEN MRN: [id number] Date of Birth: 02-Apr-1941  Transition of Care [facility]) CM/SW Contact  Eduard Roux, [place] Phone Number: 07/15/2023, 10:19 AM  Clinical Narrative:     CSW spoke with patient's spouse- informed received insurance auth for SNF- anticipate d/c today.  TOC will continue to follow and assist with discharge planning.   Antony Blackbird, MSW, LCSW Clinical Social Worker    Expected Discharge Plan: Home w Home Health Services Barriers to Discharge: Continued Medical Work up  Expected Discharge Plan and Services   Discharge Planning Services: CM Consult Post Acute Care Choice: IP Rehab Living arrangements for the past 2 months: Single Family Home                                       Social Determinants of Health (SDOH) Interventions SDOH Screenings   Food Insecurity: No Food Insecurity (07/08/2023)  Housing: Low Risk  (07/08/2023)  Transportation Needs: No Transportation Needs (07/08/2023)  Utilities: Not At Risk (07/08/2023)  Tobacco Use: Low Risk  (07/05/2023)    Readmission Risk Interventions     No data to display

## 2023-07-15 NOTE — Progress Notes (Addendum)
      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 Offered that he is very pleased with the care he has been receiving.  No new concerns.  Objective: Vital signs in last 24 hours: Temp:  [97.4 F (36.3 C)-98 F (36.7 C)] 97.5 F (36.4 C) (11/18 0441) Pulse Rate:  [44-91] 81 (11/18 0441) Cardiac Rhythm: Heart block;Bundle branch block (11/17 1940) Resp:  [17-22] 20 (11/18 0441) BP: (103-139)/(44-92) 139/59 (11/18 0441) SpO2:  [94 %-98 %] 96 % (11/18 0008) Weight:  [68.6 kg] 68.6 kg (11/18 0245)   Intake/Output from previous day: 11/17 0701 - 11/18 0700 In: -  Out: 1100 [Urine:1100] Intake/Output this shift: No intake/output data recorded.  General appearance: alert, cooperative, and no distress Neurologic: intact Heart: irregular rhythm, appears to be in SR with long first degree AV block and bigeminy.  Lungs: breath sounds full and clear, adequate O2 sats on RA.  Abdomen: soft, flat, no tenderness Extremities: well perfused, no peripheral edema Wound: the sternotomy incision is well approximated and dry.   Lab Results: No results for input(s): "WBC", "HGB", "HCT", "PLT" in the last 72 hours. BMET:  Recent Labs    07/13/23 0326  NA 135  K 3.6  CL 102  CO2 19*  GLUCOSE 94  BUN 21  CREATININE 1.19  CALCIUM 8.6*    PT/INR: No results for input(s): "LABPROT", "INR" in the last 72 hours. ABG    Component Value Date/Time   PHART 7.363 07/05/2023 2208   HCO3 21.2 07/05/2023 2208   TCO2 22 07/05/2023 2208   ACIDBASEDEF 4.0 (H) 07/05/2023 2208   O2SAT 90 07/05/2023 2208   CBG (last 3)  No results for input(s): "GLUCAP" in the last 72  hours.  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 Stable BP.  On ASA, statin, BB.  -Post-op atrial fibrillation-on metoprolol 100mg  BID and diltiazem CD 120mg  daily. He is also on Eluquis.  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 Will ask the EP team to review antiarrhythmic medication management.   -ENDO- Type 2 DM and h/o hypothyroidism.- On Amaryl prior to admission, A1C 6.1.  CBG's have been discontinued. Levothyroxine resumed.  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 PT,OT, SLP recommending additional therapy at SNF. Planning for transfer to [facility] in [place] when medically stable. Bed available today.    -History of prostate cancer- on Flowmax, voiding OK.   -GI- tolerating PO's.  Was having bowel movements prior to transfer to  4E.  -Disposition- Planning for transfer to SNF after seen and cleared by EP.     LOS: 10 days    Leary Roca, PA-C 07/15/2023

## 2023-07-15 NOTE — TOC Transition Note (Signed)
Transition of Care [facility]) - CM/SW Discharge Note   Patient Details  Name: Bruce Nunez MRN: [id number] Date of Birth: 1941-08-13  Transition of Care [facility]) CM/SW Contact:  Eduard Roux, LCSW Phone Number: 07/15/2023, 1:03 PM   Clinical Narrative:     Patient will Discharge to: [facility] [place] Discharge Date:07/15/2023 Family Notified: spouse Transport By: Sharin Mons  Per MD patient is ready for discharge. RN, patient, and facility notified of discharge. Discharge Summary sent to facility. RN given number for [phone]. Ambulance transport requested for patient.   Clinical Social Worker signing off.  Antony Blackbird, MSW, LCSW Clinical Social Worker     Final next level of care: Skilled Nursing Facility Barriers to Discharge: Barriers Resolved   Patient Goals and CMS Choice CMS Medicare.gov Compare Post Acute Care list provided to:: Patient Represenative (must comment) Choice offered to / list presented to : Spouse  Discharge Placement                Patient chooses bed at:  [facility]) Patient to be transferred to facility by: PTAR Name of family member notified: spouse Patient and family notified of of transfer: 07/15/23  Discharge Plan and Services Additional resources added to the After Visit Summary for     Discharge Planning Services: CM Consult Post Acute Care Choice: IP Rehab                               Social Determinants of Health (SDOH) Interventions SDOH Screenings   Food Insecurity: No Food Insecurity (07/08/2023)  Housing: Low Risk  (07/08/2023)  Transportation Needs: No Transportation Needs (07/08/2023)  Utilities: Not At Risk (07/08/2023)  Tobacco Use: Low Risk  (07/05/2023)     Readmission Risk Interventions     No data to display

## 2023-07-15 NOTE — Progress Notes (Signed)
Mobility Specialist Progress Note:   07/15/23 1042  Mobility  Activity Transferred from bed to chair;Ambulated with assistance in room  Level of Assistance Contact guard assist, steadying assist  Assistive Device Front wheel walker  Distance Ambulated (ft) 15 ft  RUE Weight Bearing NWB  LUE Weight Bearing NWB  Activity Response Tolerated well  Mobility Referral Yes  $Mobility charge 1 Mobility  Mobility Specialist Start Time (ACUTE ONLY) 1020  Mobility Specialist Stop Time (ACUTE ONLY) 1034  Mobility Specialist Time Calculation (min) (ACUTE ONLY) 14 min   Pre Mobility: 82 HR  Post Mobility: 92 HR  Pt received in bed, agreeable to transfer to chair. Pt able to stand within precautions with only CG assist for balance. Pt able to walk around and bed to chair w/o fault. Pt left in chair with call bell in reach and all needs met.   [name]  Mobility Specialist Please contact via [employer] office at [phone]

## 2023-07-15 NOTE — Plan of Care (Signed)
  Problem: Education: Goal: Knowledge of General Education information will improve Description: Including pain rating scale, medication(s)/side effects and non-pharmacologic comfort measures Outcome: Adequate for Discharge   Problem: Health Behavior/Discharge Planning: Goal: Ability to manage health-related needs will improve Outcome: Adequate for Discharge   Problem: Clinical Measurements: Goal: Ability to maintain clinical measurements within normal limits will improve Outcome: Adequate for Discharge   Problem: Clinical Measurements: Goal: Will remain free from infection Outcome: Adequate for Discharge   Problem: Clinical Measurements: Goal: Diagnostic test results will improve Outcome: Adequate for Discharge   Problem: Skin Integrity: Goal: Risk for impaired skin integrity will decrease Outcome: Adequate for Discharge

## 2023-07-15 NOTE — Plan of Care (Signed)
POC progressing.  

## 2023-07-15 NOTE — Consult Note (Signed)
Cardiology Consultation   Patient ID: [name] MRN: [id number]; DOB: 05-21-1941  Admit date: 07/05/2023 Date of Consult: 07/15/2023  PCP:  Gus Height, PA   [facility] Providers Cardiologist: Dr. Dulce Sellar   Patient Profile:   Bruce Nunez is a 82 y.o. male with a hx of HTN, HLD, CAD, VHD, stroke, who is being seen 07/15/2023 for the evaluation of AFib, AV block at the request of Dr. Leafy Ro.  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) and CABG  Is now s/p  CABG (LIMA > LAD) and AVR w/44mm Inspiris Pericardial valve  Post op course with some intermittent confusion -- MRI showed acute left temporal/occipital, right parietal and right frontal infarcts  Neurology s/o 07/11/23, rec DOAC as per cardiology team 07/12/23: developed PAFib Metoprolol and diltiazem - PVCs discussed  Today CTS Team asks for EP Team to see with PAFib, PVCs and 1st degree AVblock for recommendations for rhythm management  Eliquis started 11/15 > off plavix Diltiazem started 11/15 (120mg  daily) Metoprolol noted post op >> titrating upwards, current 100mg  BID  LABS today K+ 3.6 BUN/Creat 21/1/19  11/13 WBC 6.5 H/H 9.7/29 Plts 143  Wife is bedside, the patient reports that he is doing fair, has better days and worse days, though all in all, OK.  Reports he gets tired easy and a little disappointed in that No CP   Past Medical History:  Diagnosis Date   Anemia    recent iron supplement   Aortic regurgitation 03/12/2017   Aortic stenosis 04/29/2019   Asthma    childhood-   2013 bronchitis w/wheezing   Complication of anesthesia    " ether made me sick" - "woke up during colonoscopy"   Diabetes mellitus without complication (HCC)    Essential hypertension 03/12/2017   Frequency of urination    GERD (gastroesophageal reflux disease)    occasional   Heart murmur    Hemorrhoids    History of kidney stones    Hx of colon  cancer, stage I    Hx of nonmelanoma skin cancer    Hx of peptic ulcer    as teenager   Hx of transfusion    Hyperlipidemia    Hypertension    Inguinal hernia    PONV (postoperative nausea and vomiting)    only when had Ether as Anesthesia drug   Prostate cancer (HCC) 08/15/2012   also Hx colon cancer / Hx skin cancer   Right inguinal hernia 05/08/2016   S/P right inguinal hernia repair Sept 2015 05/17/2014   Recurrent right indirect  Hernia treated with robotic TAPP repair with enterolysis Sept 2017   Stroke [facility]) 03/28/2022    Past Surgical History:  Procedure Laterality Date   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   Procedure: AORTIC VALVE REPLACEMENT WITH INSPIRIS AORTIC VALVE;  Surgeon: [name], MD;  Location: [facility] OR;  Service: Open Heart Surgery;  Laterality: N/A;   cataract surgery  2009   CORONARY ARTERY BYPASS GRAFT N/A 07/05/2023   Procedure: CORONARY ARTERY BYPASS GRAFTING, USING LEFT INTERNAL MAMMARY ARTERY;  Surgeon: [name], MD;  Location: [facility] OR;  Service: Open Heart Surgery;  Laterality: N/A;   CYSTOSCOPY     INGUINAL HERNIA REPAIR Right 05/17/2014   Procedure: RIGHT INGUINAL HERNIA REPAIR ;  Surgeon: Wenda Low, MD;  Location: [facility] ORS;  Service: General;  Laterality: Right;  With MESH   KNEE SURGERY Right 2012  LEFT HEART CATH AND CORONARY ANGIOGRAPHY N/A 05/03/2023   Procedure: LEFT HEART CATH AND CORONARY ANGIOGRAPHY;  Surgeon: Tonny Bollman, MD;  Location: [facility] INVASIVE CV LAB;  Service: Cardiovascular;  Laterality: N/A;   PARTIAL COLECTOMY  2005   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   RADIOACTIVE SEED IMPLANT N/A 11/24/2012   Procedure: RADIOACTIVE SEED IMPLANT;  Surgeon: Valetta Fuller, MD;  Location: [facility];  Service: Urology;  Laterality: N/A;   71seeds implanted  no seeds found in bladder   TEE WITHOUT CARDIOVERSION N/A 07/05/2023   Procedure: TRANSESOPHAGEAL ECHOCARDIOGRAM;  Surgeon: [name], MD;  Location: [facility]  OR;  Service: Open Heart Surgery;  Laterality: N/A;   THROAT SURGERY  1940's   as child (age 40)growth that created fissure/corrective surgery   TONSILLECTOMY     age 46   XI ROBOTIC ASSISTED INGUINAL HERNIA REPAIR WITH MESH Right 05/08/2016   Procedure: XI ROBOTIC ASSISTED REPAIR OF RECURRENT RIGHT INGUINAL HERNIA;  Surgeon: Luretha Murphy, MD;  Location: [facility] ORS;  Service: General;  Laterality: Right;  With MESH       Inpatient Medications: Scheduled Meds:  acetaminophen  1,000 mg Oral Q6H   apixaban  5 mg Oral BID   arformoterol  15 mcg Nebulization BID   aspirin  81 mg Oral Daily   budesonide (PULMICORT) nebulizer solution  0.25 mg Nebulization BID   diltiazem  120 mg Oral Daily   feeding supplement  237 mL Oral TID WC   fesoterodine  4 mg Oral Daily   fluticasone  1 spray Each Nare Daily   furosemide  40 mg Oral Daily   levothyroxine  75 mcg Oral Q0600   metoprolol tartrate  100 mg Oral BID   pantoprazole  40 mg Oral Daily   potassium chloride  20 mEq Oral Daily   QUEtiapine  25 mg Oral QHS   rosuvastatin  20 mg Oral Daily   tamsulosin  0.4 mg Oral BID   Continuous Infusions:  PRN Meds: albuterol, dextrose, dicyclomine, docusate sodium, guaiFENesin, hydrALAZINE, methocarbamol, metoprolol tartrate, ondansetron (ZOFRAN) IV, mouth rinse, sodium chloride flush  Allergies:    Allergies  Allergen Reactions   Penicillins Anaphylaxis, Shortness Of Breath, Itching and Swelling    Throat swells up but no intubation per wife   Iodinated Contrast Media Hives and Rash   Aspirin Other (See Comments)    GI bleeding-avoids   Metformin And Related Diarrhea   Monodox [Doxycycline Hyclate] Nausea Only    "made me feel sick"   Sulfa Antibiotics Itching and Swelling   Tramadol Nausea And Vomiting    Social History:   Social History   Socioeconomic History   Marital status: Married    Spouse name: Not on file   Number of children: 2   Years of education: Not on file   Highest  education level: Not on file  Occupational History   Occupation: Retired    Comment: Designer, fashion/clothing  Tobacco Use   Smoking status: Never    Passive exposure: Never   Smokeless tobacco: Never  Vaping Use   Vaping status: Never Used  Substance and Sexual Activity   Alcohol use: No   Drug use: No   Sexual activity: Not on file  Other Topics Concern   Not on file  Social History Narrative   Not on file   Social Determinants of Health   Financial Resource Strain: Not on file  Food Insecurity: No Food Insecurity (07/08/2023)   Hunger  Vital Sign    Worried About Programme researcher, broadcasting/film/video in the Last Year: Never true    Ran Out of Food in the Last Year: Never true  Transportation Needs: No Transportation Needs (07/08/2023)   PRAPARE - Administrator, Civil Service (Medical): No    Lack of Transportation (Non-Medical): No  Physical Activity: Not on file  Stress: Not on file  Social Connections: Not on file  Intimate Partner Violence: Not At Risk (07/08/2023)   Humiliation, Afraid, Rape, and Kick questionnaire    Fear of Current or Ex-Partner: No    Emotionally Abused: No    Physically Abused: No    Sexually Abused: No    Family History:   Family History  Problem Relation Age of Onset   Heart attack Other    Brain cancer Other    Coronary artery disease Other    Prostate cancer Other      ROS:  Please see the history of present illness.  All other ROS reviewed and negative.     Physical Exam/Data:   Vitals:   07/15/23 0812 07/15/23 0830 07/15/23 0957 07/15/23 1210  BP:   (!) 142/48 99/70  Pulse:    (!) 55  Resp:    16  Temp:    (!) 97.5 F (36.4 C)  TempSrc:    Oral  SpO2: 100% 100%  99%  Weight:      Height:        Intake/Output Summary (Last 24 hours) at 07/15/2023 1242 Last data filed at 07/15/2023 0443 Gross per 24 hour  Intake --  Output 1100 ml  Net -1100 ml      07/15/2023    2:45 AM 07/14/2023    3:15 AM 07/13/2023    4:45 AM  Last 3  Weights  Weight (lbs) 151 lb 3.8 oz 145 lb 15.1 oz 154 lb 1.6 oz  Weight (kg) 68.6 kg 66.2 kg 69.9 kg     Body mass index is 23.69 kg/m.  General:  Well nourished, well developed, in no acute distress HEENT: normal Neck: no JVD Vascular: No carotid bruits; Distal pulses 2+ bilaterally Cardiac:  RRR; no murmurs, gallops or rubs Lungs:  CTA b/l, no wheezing, rhonchi or rales  Abd: soft, nontender Ext: no edema Musculoskeletal:  No deformities Skin: thoracotomy is CDI Neuro:   no focal abnormalities noted Psych:  Normal affect   EKG:  The EKG was personally reviewed and demonstrates:    SR 75bpm, 1st degree AVblock , RBBB  134ms(new), LAD (new) CHB 54bpm, RBBB escape  SR 98bpm, 1st degree AVblock , RBBB Underlying rhythm likely CHB, some conducted beats, RBBB   OLD/pre-op SR 70bpm, borderline 1st degree AVBlock , narrow QRS 66ms   Telemetry:  Telemetry was personally reviewed and demonstrates:    Looks like CHB, V rate 50's-60's, PVCs   Relevant CV Studies:  07/05/23: inter-op TEE POST-OP IMPRESSIONS  _ Left Ventricle: The left ventricle is unchanged from pre-bypass.  _ Right Ventricle: The right ventricle appears unchanged from pre-bypass.  _ Aorta: The aorta appears unchanged from pre-bypass.  _ Left Atrial Appendage: The left atrial appendage appears unchanged from  pre-bypass.  _ Aortic Valve: A bioprosthetic valve was placed, leaflets are freely  mobile  Size; 21mm. The gradient recorded across the prosthetic valve is within  the  expected range. No perivalvular leak noted. Mean PG  _ Mitral Valve: The mitral valve appears unchanged from pre-bypass.  _  Tricuspid Valve: The tricuspid valve appears unchanged from pre-bypass.  _ Pulmonic Valve: The pulmonic valve appears unchanged from pre-bypass.  _ Interatrial Septum: The interatrial septum appears unchanged from  pre-bypass.  _ Pericardium: The pericardium appears unchanged  from pre-bypass.  _ Comments: S/P AVR CABG. No new or worsening wall motion or valvular  abnormalities post procedure. 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 Seated appropriately without rock or leak. Gradients WNL.   PRE-OP FINDINGS   Left Ventricle: The left ventricle has normal systolic function, with an  ejection fraction of 60-65%. The cavity size was normal. Left ventricular  diastolic parameters are consistent with Grade I diastolic dysfunction  (impaired relaxation).    Right Ventricle: The right ventricle has normal systolic function. The  cavity was normal. There is no increase in right ventricular wall  thickness. Right ventricular systolic pressure is normal.   Left Atrium: Left atrial size was normal in size. No left atrial/left  atrial appendage thrombus was detected. Left atrial appendage velocity is  normal at greater than 40 cm/s.   Right Atrium: Right atrial size was normal in size.   Interatrial Septum: No atrial level shunt detected by color flow Doppler.  The interatrial septum appears to be lipomatous. There is no evidence of a  patent foramen ovale.   Pericardium: There is no evidence of pericardial effusion. There is no  pleural effusion.   Mitral Valve: The mitral valve is normal in structure. Mitral valve  regurgitation is trivial by color flow Doppler. There is no evidence of  mitral valve vegetation. There is No evidence of mitral stenosis.   Tricuspid Valve: The tricuspid valve was normal in structure. Tricuspid  valve regurgitation is trivial by color flow Doppler. No evidence of  tricuspid stenosis is present. There is no evidence of tricuspid valve  vegetation.   Aortic Valve: The aortic valve is tricuspid. Aortic valve regurgitation is  moderate to severe. The jet is eccentric anteriorly directed. There is  mild stenosis of the aortic valve, with a calculated valve area of 1.40  cm.  AI PHT , VC .7cm, diastolic  reversal descending thoracic aorta, EROA  PISA .32cm2.   Pulmonic Valve: The pulmonic valve was normal in structure, with normal.  No evidence of pumonic stenosis.  Pulmonic valve regurgitation is mild by color flow Doppler.    Aorta: The aortic root and ascending aorta are normal in size and  structure.   Pulmonary Artery: Theone Murdoch catheter present on the right. The pulmonary  artery is of normal size.   Shunts: There is no evidence of an atrial septal defect.    05/03/23: LHC  Prox RCA to Mid RCA lesion is 50% stenosed.   Mid LM to Dist LM lesion is 30% stenosed.   Prox Cx to Dist Cx lesion is 50% stenosed.   Prox LAD to Mid LAD lesion is 50% stenosed.   1st Diag lesion is 60% stenosed.   There is severe (4+) aortic regurgitation.   1.  Moderate multivessel coronary artery disease with mild distal left main plaquing of 30%, moderate diffuse LAD stenosis of 50%, mild to moderate mid circumflex stenosis of 40 to 50%, and moderate mid RCA stenosis of 50%. 2.  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  Severe right subclavian tortuosity makes cardiac catheterization very difficult from right radial access.  If patient requires future cardiac catheterization, avoid right radial access.   Recommendations: Heart team review,  cardiac CTA (TAVR protocol) to evaluate if there is enough aortic valve calcification to perform TAVR as aortic insufficiency appears to be the primary valve lesion.  The patient has a very wide pulse pressure and angiographically has severe AI.  Laboratory Data:  High Sensitivity Troponin:  No results for input(s): "TROPONINIHS" in the last 720 hours.   Chemistry Recent Labs  Lab 07/10/23 0354 07/11/23 0437 07/13/23 0326  NA 135 133* 135  K 3.6 3.8 3.6  CL 102 102 102  CO2 25 23 19*  GLUCOSE 114* 116* 94  BUN 23 18 21   CREATININE 1.21 1.28* 1.19  CALCIUM 8.5* 8.4* 8.6*  GFRNONAA 59* 56* >60  ANIONGAP 8 8 14     No  results for input(s): "PROT", "ALBUMIN", "AST", "ALT", "ALKPHOS", "BILITOT" in the last 168 hours. Lipids  Recent Labs  Lab 07/11/23 0437  CHOL 74  TRIG 149  HDL 11*  LDLCALC 33  CHOLHDL 6.7    Hematology Recent Labs  Lab 07/10/23 0354  WBC 6.5  RBC 3.52*  HGB 9.7*  HCT 29.8*  MCV 84.7  MCH 27.6  MCHC 32.6  RDW 14.1  PLT 143*   Thyroid No results for input(s): "TSH", "FREET4" in the last 168 hours.  BNPNo results for input(s): "BNP", "PROBNP" in the last 168 hours.  DDimer No results for input(s): "DDIMER" in the last 168 hours.   Radiology/Studies:  No results found.   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 Rates 50's-60's mostly, some faster  I think we will need to peel away at some his nodal blockade and monitor his conduction May need pacing  EP MD will see   Risk Assessment/Risk Scores:   For questions or updates, please contact [facility] Please consult [email or web address] for contact info under    Signed, Sheilah Pigeon, PA-C  07/15/2023 12:42 PM '

## 2023-07-15 NOTE — Progress Notes (Signed)
CARDIAC REHAB PHASE I   Plan for possible discharge to SNF today. Reviewed post op OHS education with wife. All questions and concerns addressed. Referral sent to [facility] for CRP2.   [id number] Woodroe Chen, RN BSN 07/15/2023 9:49 AM

## 2023-07-15 NOTE — Progress Notes (Signed)
Mobility Specialist Progress Note:   07/15/23 1639  Mobility  Activity Stood at bedside  Level of Assistance Contact guard assist, steadying assist  Assistive Device Front wheel walker  RUE Weight Bearing NWB  LUE Weight Bearing NWB  Activity Response Tolerated well  Mobility Referral Yes  $Mobility charge 1 Mobility  Mobility Specialist Start Time (ACUTE ONLY) 1520  Mobility Specialist Stop Time (ACUTE ONLY) 1530  Mobility Specialist Time Calculation (min) (ACUTE ONLY) 10 min   Pt received in bed, agreeable to mobility. Upon standing pt c/o feeling dizzy and requesting to sit back down. Rated dizziness 10/10. Pt returned to bed asymptomatic with call bell in reach and all needs met. RN notified.   [name]  Mobility Specialist Please contact via [employer] office at [phone]

## 2023-07-16 ENCOUNTER — Inpatient Hospital Stay (HOSPITAL_COMMUNITY): Payer: Medicare Other

## 2023-07-16 DIAGNOSIS — Z952 Presence of prosthetic heart valve: Secondary | ICD-10-CM

## 2023-07-16 DIAGNOSIS — I442 Atrioventricular block, complete: Secondary | ICD-10-CM

## 2023-07-16 HISTORY — DX: Atrioventricular block, complete: I44.2

## 2023-07-16 LAB — ECHOCARDIOGRAM LIMITED
AR max vel: 2.36 cm2
AV Area VTI: 2.26 cm2
AV Area mean vel: 2.3 cm2
AV Mean grad: 6 mm[Hg]
AV Peak grad: 11.3 mm[Hg]
Ao pk vel: 1.68 m/s
Height: 67 in
Weight: 2423.3 [oz_av]

## 2023-07-16 MED ORDER — SENNOSIDES-DOCUSATE SODIUM 8.6-50 MG PO TABS
1.0000 | ORAL_TABLET | Freq: Two times a day (BID) | ORAL | Status: DC | PRN
  Administered 2023-07-16: 1 via ORAL
  Filled 2023-07-16: qty 1

## 2023-07-16 MED ORDER — DOCUSATE SODIUM 100 MG PO CAPS
200.0000 mg | ORAL_CAPSULE | Freq: Every day | ORAL | Status: DC
  Administered 2023-07-16 – 2023-07-17 (×2): 200 mg via ORAL
  Filled 2023-07-16 (×2): qty 2

## 2023-07-16 NOTE — Progress Notes (Signed)
Very pleasant pt, assisted to commode and then chair. Chair alarm set, pt instructed not to move unless nurse is present. Pt understands.   Bruce Nunez 07/16/2023 11:07 AM  1020-1107

## 2023-07-16 NOTE — Progress Notes (Signed)
Speech Language Pathology Treatment: Cognitive-Linquistic  Patient Details Name: Bruce Nunez MRN: [id number] DOB: [birth year] Today's Date: 07/16/2023 Time: 4098-1191 SLP Time Calculation (min) (ACUTE ONLY): 18 min  Assessment / Plan / Recommendation Clinical Impression  Pt seen for cognitive intervention. Pt stated his cognition has been improving since admission. He was able to recall cardiac events of yesterday and MD making changes to his medications but did not mention possible need for pacemaker or upcoming test (ECHO) and stated he may be leaving tomorrow. OT in room just prior to SLP and he was able to accurately state instruction given by OT. He stated memory was not good last night or earlier this morning so he is having some fluctuations in working memory. He stated he has pen and paper scattered throughout his house to write information as needed.  Pt looked at a mock menu and answered questions including calculating sum of 3 items with 95% accuracy. His speech is intelligible with slight distortion and SLP reviewed dysarthria strategies. ST to continue.    HPI HPI: Bruce Nunez is an 82 y.o. 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 Neurology was consulted 11/13 due to increased confusion.      MRI brain was ordered that shows punctate foci of acute infarction in the left temporal occipital  junction and right parietal subcortical white matter, an acute cortical infarct along the right superior frontal gyrus and additional foci of DWI hyperintensity along the left pre and  postcentral gyri, likely reflecting subacute infarcts.      SLP Plan  Continue with current plan of care      Recommendations for follow up therapy are one component of a multi-disciplinary discharge planning process, led by  the attending physician.  Recommendations may be updated based on patient status, additional functional criteria and insurance authorization.    Recommendations                     Oral care BID   Intermittent Supervision/Assistance Frontal lobe and executive function deficit   Cerebral infarction Continue with current plan of care     Bruce Nunez  07/16/2023, 3:22 PM

## 2023-07-16 NOTE — Progress Notes (Signed)
Rounding Note    Patient Name: Bruce Nunez Date of Encounter: 07/16/2023  [facility] Cardiologist: Dr. Dulce Sellar  Subjective   Feels well, NAEO  Inpatient Medications    Scheduled Meds:  acetaminophen  1,000 mg Oral Q6H   apixaban  5 mg Oral BID   arformoterol  15 mcg Nebulization BID   aspirin  81 mg Oral Daily   budesonide (PULMICORT) nebulizer solution  0.25 mg Nebulization BID   docusate sodium  200 mg Oral Daily   feeding supplement  237 mL Oral TID WC   fesoterodine  4 mg Oral Daily   fluticasone  1 spray Each Nare Daily   furosemide  40 mg Oral Daily   levothyroxine  75 mcg Oral Q0600   pantoprazole  40 mg Oral Daily   potassium chloride  20 mEq Oral Daily   QUEtiapine  25 mg Oral QHS   rosuvastatin  20 mg Oral Daily   tamsulosin  0.4 mg Oral BID   Continuous Infusions:  PRN Meds: albuterol, dextrose, dicyclomine, guaiFENesin, hydrALAZINE, methocarbamol, metoprolol tartrate, ondansetron (ZOFRAN) IV, mouth rinse, sodium chloride flush   Vital Signs    Vitals:   07/15/23 2258 07/16/23 0408 07/16/23 0753 07/16/23 0803  BP: (!) 153/58 128/67 139/63   Pulse: 85 83 63 89  Resp:   (!) 22 16  Temp: 97.6 F (36.4 C) 98.2 F (36.8 C) 97.7 F (36.5 C)   TempSrc: Oral Oral Oral   SpO2: 95% 95% 97% 100%  Weight:  68.7 kg    Height:        Intake/Output Summary (Last 24 hours) at 07/16/2023 0810 Last data filed at 07/16/2023 0415 Gross per 24 hour  Intake 250 ml  Output 900 ml  Net -650 ml      07/16/2023    4:08 AM 07/15/2023    2:45 AM 07/14/2023    3:15 AM  Last 3 Weights  Weight (lbs) 151 lb 7.3 oz 151 lb 3.8 oz 145 lb 15.1 oz  Weight (kg) 68.7 kg 68.6 kg 66.2 kg      Telemetry    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   Neck:  No JVD Cardiac: RRR, no murmurs, rubs, or gallops.  Respiratory: CTA b/l. GI: Soft, nontender, non-distended  MS: No edema; No deformity. Neuro:  Nonfocal  Psych: Normal affect   Labs    High Sensitivity Troponin:  No results for input(s): "TROPONINIHS" in the last 720 hours.   Chemistry Recent Labs  Lab 07/10/23 0354 07/11/23 0437 07/13/23 0326  NA 135 133* 135  K 3.6 3.8 3.6  CL 102 102 102  CO2 25 23 19*  GLUCOSE 114* 116* 94  BUN 23 18 21   CREATININE 1.21 1.28* 1.19  CALCIUM 8.5* 8.4* 8.6*  GFRNONAA 59* 56* >60  ANIONGAP 8 8 14     Lipids  Recent Labs  Lab 07/11/23 0437  CHOL 74  TRIG 149  HDL 11*  LDLCALC 33  CHOLHDL 6.7    Hematology Recent Labs  Lab 07/10/23 0354  WBC 6.5  RBC 3.52*  HGB 9.7*  HCT 29.8*  MCV 84.7  MCH 27.6  MCHC 32.6  RDW 14.1  PLT 143*   Thyroid No results for input(s): "TSH", "FREET4"  in the last 168 hours.  BNPNo results for input(s): "BNP", "PROBNP" in the last 168 hours.  DDimer No results for input(s): "DDIMER" in the last 168 hours.   Radiology    No results found.  Cardiac Studies   07/05/23: inter-op TEE POST-OP IMPRESSIONS  _ Left Ventricle: The left ventricle is unchanged from pre-bypass.  _ Right Ventricle: The right ventricle appears unchanged from pre-bypass.  _ Aorta: The aorta appears unchanged from pre-bypass.  _ Left Atrial Appendage: The left atrial appendage appears unchanged from  pre-bypass.  _ Aortic Valve: A bioprosthetic valve was placed, leaflets are freely  mobile  Size; 21mm. The gradient recorded across the prosthetic valve is within  the  expected range. No perivalvular leak noted. Mean PG  _ Mitral Valve: The mitral valve appears unchanged from pre-bypass.  _ Tricuspid Valve: The tricuspid valve appears unchanged from pre-bypass.  _ Pulmonic Valve: The pulmonic valve appears unchanged from pre-bypass.  _ Interatrial Septum: The interatrial septum appears unchanged from  pre-bypass.   _ Pericardium: The pericardium appears unchanged from pre-bypass.  _ Comments: S/P AVR CABG. No new or worsening wall motion or valvular  abnormalities post procedure. 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 Seated appropriately without rock or leak. Gradients WNL.   PRE-OP FINDINGS   Left Ventricle: The left ventricle has normal systolic function, with an  ejection fraction of 60-65%. The cavity size was normal. Left ventricular  diastolic parameters are consistent with Grade I diastolic dysfunction  (impaired relaxation).    Right Ventricle: The right ventricle has normal systolic function. The  cavity was normal. There is no increase in right ventricular wall  thickness. Right ventricular systolic pressure is normal.   Left Atrium: Left atrial size was normal in size. No left atrial/left  atrial appendage thrombus was detected. Left atrial appendage velocity is  normal at greater than 40 cm/s.   Right Atrium: Right atrial size was normal in size.   Interatrial Septum: No atrial level shunt detected by color flow Doppler.  The interatrial septum appears to be lipomatous. There is no evidence of a  patent foramen ovale.   Pericardium: There is no evidence of pericardial effusion. There is no  pleural effusion.   Mitral Valve: The mitral valve is normal in structure. Mitral valve  regurgitation is trivial by color flow Doppler. There is no evidence of  mitral valve vegetation. There is No evidence of mitral stenosis.   Tricuspid Valve: The tricuspid valve was normal in structure. Tricuspid  valve regurgitation is trivial by color flow Doppler. No evidence of  tricuspid stenosis is present. There is no evidence of tricuspid valve  vegetation.   Aortic Valve: The aortic valve is tricuspid. Aortic valve regurgitation is  moderate to severe. The jet is eccentric anteriorly directed. There is  mild stenosis of the aortic valve, with a calculated valve area  of 1.40  cm.  AI PHT , VC .7cm, diastolic reversal descending thoracic aorta, EROA  PISA .32cm2.   Pulmonic Valve: The pulmonic valve was normal in structure, with normal.  No evidence of pumonic stenosis.  Pulmonic valve regurgitation is mild by color flow Doppler.    Aorta: The aortic root and ascending aorta are normal in size and  structure.   Pulmonary Artery: Theone Murdoch catheter present on the right. The pulmonary  artery is of normal size.   Shunts: There is no evidence of an atrial septal defect.  05/03/23: LHC  Prox RCA to Mid RCA lesion is 50% stenosed.   Mid LM to Dist LM lesion is 30% stenosed.   Prox Cx to Dist Cx lesion is 50% stenosed.   Prox LAD to Mid LAD lesion is 50% stenosed.   1st Diag lesion is 60% stenosed.   There is severe (4+) aortic regurgitation.   1.  Moderate multivessel coronary artery disease with mild distal left main plaquing of 30%, moderate diffuse LAD stenosis of 50%, mild to moderate mid circumflex stenosis of 40 to 50%, and moderate mid RCA stenosis of 50%. 2.  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  Severe right subclavian tortuosity makes cardiac catheterization very difficult from right radial access.  If patient requires future cardiac catheterization, avoid right radial access.   Recommendations: Heart team review, cardiac CTA (TAVR protocol) to evaluate if there is enough aortic valve calcification to perform TAVR as aortic insufficiency appears to be the primary valve lesion.  The patient has a very wide pulse pressure and angiographically has severe AI.  Patient Profile     82 y.o. 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  Metoprolol and diltiazem stopped yesterday, last dose 09:57 (06/27/13) Conduction is improved some Brief CHB episodes noted still, has lomg periods of 1:1 conduction with 1st degree AVblock, also long periods of frequent PVCs with marked 1st degree AVblock  on conducted beats (perhaps retrograde P waves (from PVCs)    Suspect he may need pacing though will allow washout of nodal blockers Continue to mobilize Will get limited echo to eval post-op LVEF with suspicion he will need pacing NPO after MN   For questions or updates, please contact [facility] Please consult [email or web address] for contact info under        Signed, Sheilah Pigeon, PA-C  07/16/2023, 8:10 AM

## 2023-07-16 NOTE — Progress Notes (Addendum)
      [address]       [place] [address]             [phone]      11 Days Post-Op Procedure(s) (LRB): AORTIC VALVE REPLACEMENT WITH INSPIRIS AORTIC VALVE (N/A) CORONARY ARTERY BYPASS GRAFTING, USING LEFT INTERNAL MAMMARY ARTERY (N/A) TRANSESOPHAGEAL ECHOCARDIOGRAM (N/A) Subjective: The patient feels like he had some slight wheezing this AM and is worried since he had bad pneumonia as a kid. He is alert and oriented x 3 this AM.   Objective: Vital signs in last 24 hours: Temp:  [97.5 F (36.4 C)-98.2 F (36.8 C)] 98.2 F (36.8 C) (11/19 0408) Pulse Rate:  [55-85] 83 (11/19 0408) Cardiac Rhythm: Atrial fibrillation (11/18 0745) Resp:  [16-20] 18 (11/18 1640) BP: (99-153)/(48-70) 128/67 (11/19 0408) SpO2:  [95 %-100 %] 95 % (11/19 0408) FiO2 (%):  [98 %] 98 % (11/18 1920) Weight:  [68.7 kg] 68.7 kg (11/19 0408)  Hemodynamic parameters for last 24 hours:    Intake/Output from previous day: 11/18 0701 - 11/19 0700 In: 250 [P.O.:250] Out: 900 [Urine:900] Intake/Output this shift: No intake/output data recorded.  General appearance: alert, cooperative, and no distress Neurologic: intact Heart: irregular rhythm this AM Lungs: clear to auscultation bilaterally Abdomen: soft, non-tender; bowel sounds normal; no masses,  no organomegaly Extremities: extremities normal, atraumatic, no cyanosis or edema Wound: Clean and dry without sign of infection  Lab Results: No results for input(s): "WBC", "HGB", "HCT", "PLT" in the last 72 hours. BMET: No results for input(s): "NA", "K", "CL", "CO2", "GLUCOSE", "BUN", "CREATININE", "CALCIUM" in the last 72 hours.  PT/INR: No results for input(s): "LABPROT", "INR" in the last 72 hours. ABG    Component Value Date/Time   PHART 7.363 07/05/2023 2208   HCO3 21.2 07/05/2023 2208   TCO2 22 07/05/2023 2208   ACIDBASEDEF 4.0 (H) 07/05/2023 2208   O2SAT 90 07/05/2023 2208   CBG (last 3)  No results for  input(s): "GLUCAP" in the last 72 hours.  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 Intermittent confusion at times. PT/OT/SLP, to be continued at [facility]. On ASA and Plavix transitioned to Eliquis due to postop afib.   CV: Postoperative atrial fibrillation with controlled rate started on metoprolol 100mg  BID and diltiazem 120mg  daily. Now with CHB HR 70s this AM, EP discontinued Metoprolol and diltiazem. EP following, may need pacemaker. SBP 128 this AM.   Pulm: Saturating well on RA. Pt felt wheezing this AM, no wheezing on exam. Continue nebs, mucinex PRN, flutter valve and IS. Continue ambulation.   GI: Patient has not had bowel movement since diarrhea 11/14 when stool softeners were discontinued. Will restart daily stool softeners.   Endo: DM on Glimepiride at home, will restart at discharge since pt had not had a great appetite. Hypothyroid, on levothyrozine.   Renal: Last creatinine 1.19. +5lbs. Continue Lasix and potassium. Continue Flomax  Deconditioning: Continue PT/OT, SNF at discharge  Dispo: Pt has a bed at SNF, may need a pacemaker. Will d/c to SNF once cleared by EP.     LOS: 11 days    Jenny Reichmann, PA-C 07/16/2023

## 2023-07-16 NOTE — Progress Notes (Signed)
Echocardiogram 2D Echocardiogram has been performed.  Warren Lacy Daley Mooradian RDCS 07/16/2023, 1:28 PM

## 2023-07-16 NOTE — Progress Notes (Signed)
Physical Therapy Treatment Patient Details Name: Bruce Nunez MRN: [id number] DOB: [birth year] Today's Date: 07/16/2023   History of Present Illness Pt is a 82yo male who was admitted for dramatic decline in function due to DOE and fatigue. 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 PMH/PSH: aortic stenosis, portstate ca, GERD, s/p R inguinal hernia repair 9/2-15, HTN, HLD, asthma    PT Comments  Pt with improved cognition however remains overall mildly confused vs hallucinations. Aware pt is being monitored for possible pacemaker placement. Pt's HR ranged from 96-102 bpm t/o PT session. Pt c/o SOB and becoming tired quickly. Pt pleasant, cooperative and gave great effort t/o session. Pt remains to have impaired balance, decreased activity tolerance, mild confusion, and is at increased falls risk. Pt to benefit from inpatient rehab program < 3 hrs/day to address above deficits. Acute PT to cont to follow.    If plan is discharge home, recommend the following: Supervision due to cognitive status;Help with stairs or ramp for entrance;Direct supervision/assist for medications management;Assist for transportation;A little help with walking and/or transfers   Can travel by private vehicle     No  Equipment Recommendations  Other (comment) (defer to post acute)    Recommendations for Other Services       Precautions / Restrictions Precautions Precautions: Fall;Sternal Precaution Booklet Issued: Yes (comment) Precaution Comments: Pt can remember no pushing/no pulling but does not follow precautions with functional mobility Restrictions Weight Bearing Restrictions: No RUE Weight Bearing: Non weight bearing LUE Weight Bearing: Non weight bearing Other Position/Activity Restrictions: sternal precautions     Mobility  Bed Mobility Overal bed mobility: Needs Assistance Bed Mobility: Supine to Sit     Supine to sit: Min assist     General bed  mobility comments: Mod cueing to prevent pulling on bed rail    Transfers Overall transfer level: Needs assistance Equipment used: Rolling walker (2 wheels) Transfers: Sit to/from Stand Sit to Stand: Min assist           General transfer comment: Min A from EOB for standing with heavy cueing for body mechanics and to prevent pt pulling up from RW or pushing up from the bed.    Ambulation/Gait Ambulation/Gait assistance: Min assist Gait Distance (Feet): 25 Feet Assistive device: Rolling walker (2 wheels) Gait Pattern/deviations: Step-through pattern, Decreased stride length, Shuffle Gait velocity: decreased Gait velocity interpretation: <1.31 ft/sec, indicative of household ambulator   General Gait Details: shuffling gait that pt has difficulty correcting. Pt requires frequent verbal cues to bring self closer to AD.   Stairs             Wheelchair Mobility     Tilt Bed    Modified Rankin (Stroke Patients Only)       Balance Overall balance assessment: Needs assistance Sitting-balance support: Feet supported, No upper extremity supported Sitting balance-Leahy Scale: Fair Sitting balance - Comments: Supervision for safety   Standing balance support: Bilateral upper extremity supported Standing balance-Leahy Scale: Poor Standing balance comment: intermittently reliant on external support to prevent falls.                            Cognition Arousal: Alert Behavior During Therapy: WFL for tasks assessed/performed, Impulsive Overall Cognitive Status: Impaired/Different from baseline Area of Impairment: Following commands, Safety/judgement, Awareness  Current Attention Level: Sustained, Focused Memory: Decreased recall of precautions, Decreased short-term memory Following Commands: Follows one step commands with increased time, Follows one step commands inconsistently Safety/Judgement: Decreased awareness of safety,  Decreased awareness of deficits Awareness: Emergent Problem Solving: Difficulty sequencing, Requires verbal cues, Requires tactile cues General Comments: pt remains confused however more aware of medical situation and plan than last week. He is aware they are seeing if he needs a pacemaker        Exercises General Exercises - Lower Extremity Long Arc Quad: AROM, Both, 10 reps, Seated Hip Flexion/Marching: AROM, Both, 10 reps, Seated Heel Raises: AROM, Both, 10 reps, Seated    General Comments General comments (skin integrity, edema, etc.): HR at 96bpm at rest and raised to 102 bpm during exercises and amb in room      Pertinent Vitals/Pain Pain Assessment Pain Assessment: No/denies pain    Home Living                          Prior Function            PT Goals (current goals can now be found in the care plan section) Acute Rehab PT Goals Patient Stated Goal: didn't state PT Goal Formulation: Patient unable to participate in goal setting Time For Goal Achievement: 07/23/23 Potential to Achieve Goals: Good Progress towards PT goals: Progressing toward goals    Frequency    Min 1X/week      PT Plan      Co-evaluation              AM-PAC PT "6 Clicks" Mobility   Outcome Measure  Help needed turning from your back to your side while in a flat bed without using bedrails?: A Little Help needed moving from lying on your back to sitting on the side of a flat bed without using bedrails?: A Little Help needed moving to and from a bed to a chair (including a wheelchair)?: A Little Help needed standing up from a chair using your arms (e.g., wheelchair or bedside chair)?: A Little Help needed to walk in hospital room?: A Little Help needed climbing 3-5 steps with a railing? : A Lot 6 Click Score: 17    End of Session Equipment Utilized During Treatment: Gait belt Activity Tolerance: Patient tolerated treatment well Patient left: in bed;with call  bell/phone within reach;with bed alarm set;with family/visitor present (sitting EOB to eat lunch, wife present and sitting next to patient) Nurse Communication: Mobility status PT Visit Diagnosis: Unsteadiness on feet (R26.81);Muscle weakness (generalized) (M62.81);Difficulty in walking, not elsewhere classified (R26.2)     Time: 1205-1223 PT Time Calculation (min) (ACUTE ONLY): 18 min  Charges:    $Gait Training: 8-22 mins PT General Charges $$ ACUTE PT VISIT: 1 Visit                     Lewis Shock, PT, DPT Acute Rehabilitation Services Secure chat preferred Office #: [phone]    Iona Hansen 07/16/2023, 2:23 PM

## 2023-07-16 NOTE — Progress Notes (Signed)
Occupational Therapy Treatment Patient Details Name: Bruce Nunez MRN: [id number] DOB: 11-24-1940 Today's Date: 07/16/2023   History of present illness Pt is a 82yo male who was admitted for dramatic decline in function due to DOE and fatigue. 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 PMH/PSH: aortic stenosis, portstate ca, GERD, s/p R inguinal hernia repair 9/2-15, HTN, HLD, asthma   OT comments  Focus session on continued education regarding ADL within sternal precautions and functional implication. Pt with much improved carryover today as compared to previous sessions, but still requiring significant cues for functional implications. Patient will benefit from continued inpatient follow up therapy, <3 hours/day       If plan is discharge home, recommend the following:  A little help with walking and/or transfers;A lot of help with bathing/dressing/bathroom;Assistance with cooking/housework;Direct supervision/assist for medications management;Direct supervision/assist for financial management;Assist for transportation;Supervision due to cognitive status   Equipment Recommendations  BSC/3in1;Other (comment) (rw)    Recommendations for Other Services      Precautions / Restrictions Precautions Precautions: Fall;Sternal Precaution Booklet Issued: Yes (comment) Precaution Comments: Pt can remember no pushing/no pulling but does not follow precautions with functional mobility or ADL without significant cues Restrictions Weight Bearing Restrictions: No RUE Weight Bearing: Non weight bearing LUE Weight Bearing: Non weight bearing Other Position/Activity Restrictions: sternal precautions       Mobility Bed Mobility Overal bed mobility: Needs Assistance Bed Mobility: Supine to Sit     Supine to sit: Min assist     General bed mobility comments: mod cues    Transfers Overall transfer level: Needs assistance Equipment used: Rolling  walker (2 wheels) Transfers: Sit to/from Stand Sit to Stand: Min assist           General transfer comment: Min A from EOB for standing with heavy cueing for body mechanics and to prevent pt pulling up from RW or pushing up from the bed.     Balance Overall balance assessment: Needs assistance Sitting-balance support: Feet supported, No upper extremity supported Sitting balance-Leahy Scale: Fair     Standing balance support: Bilateral upper extremity supported Standing balance-Leahy Scale: Poor Standing balance comment: intermittently reliant on external support to prevent falls.                           ADL either performed or assessed with clinical judgement   ADL Overall ADL's : Needs assistance/impaired                 Upper Body Dressing : Set up;Cueing for sequencing;Sitting Upper Body Dressing Details (indicate cue type and reason): simulated cues for technique to perform within precautions Lower Body Dressing: Set up;Sitting/lateral leans;Cueing for sequencing Lower Body Dressing Details (indicate cue type and reason): EOB donning socks Toilet Transfer: Minimal assistance;Stand-pivot;Rolling walker (2 wheels);BSC/3in1   Toileting- Clothing Manipulation and Hygiene: Contact guard assist;Sitting/lateral lean Toileting - Clothing Manipulation Details (indicate cue type and reason): within precautions     Functional mobility during ADLs: Minimal assistance;Rolling walker (2 wheels) (predominantly for guidance this session)      Extremity/Trunk Assessment              Vision       Perception     Praxis      Cognition Arousal: Alert Behavior During Therapy: WFL for tasks assessed/performed, Impulsive Overall Cognitive Status: Impaired/Different from baseline Area of Impairment: Following  commands, Safety/judgement, Awareness, Memory, Problem solving                   Current Attention Level: Sustained, Focused Memory: Decreased  recall of precautions, Decreased short-term memory Following Commands: Follows one step commands with increased time, Follows one step commands inconsistently Safety/Judgement: Decreased awareness of safety, Decreased awareness of deficits Awareness: Emergent Problem Solving: Difficulty sequencing, Requires verbal cues, Requires tactile cues General Comments: pt remains confused however more aware of medical situation and plan than last week. He is aware they are seeing if he needs a pacemaker. More aware of precautions and able to be redirected to follow precautions. Was able to recall how to don shirt and perform pericare within precautions by end of session on command without additional cueing        Exercises      Shoulder Instructions       General Comments HR at 96bpm at rest and raised to 102 bpm during exercises and amb in room    Pertinent Vitals/ Pain       Pain Assessment Pain Assessment: No/denies pain  Home Living                                          Prior Functioning/Environment              Frequency  Min 1X/week        Progress Toward Goals  OT Goals(current goals can now be found in the care plan section)  Progress towards OT goals: Progressing toward goals  Acute Rehab OT Goals Patient Stated Goal: get better OT Goal Formulation: With patient/family Time For Goal Achievement: 07/23/23 Potential to Achieve Goals: Good ADL Goals Pt Will Perform Upper Body Dressing: with supervision;sitting Pt Will Perform Lower Body Dressing: with contact guard assist;sit to/from stand Pt Will Transfer to Toilet: with supervision;ambulating Additional ADL Goal #1: Pt to verbalize sternal precautions with no more than min verbal cues  Plan      Co-evaluation                 AM-PAC OT "6 Clicks" Daily Activity     Outcome Measure   Help from another person eating meals?: A Little Help from another person taking care of personal  grooming?: A Little Help from another person toileting, which includes using toliet, bedpan, or urinal?: A Lot Help from another person bathing (including washing, rinsing, drying)?: A Lot Help from another person to put on and taking off regular upper body clothing?: A Little Help from another person to put on and taking off regular lower body clothing?: A Lot 6 Click Score: 15    End of Session Equipment Utilized During Treatment: Gait belt;Rolling walker (2 wheels)  OT Visit Diagnosis: Unsteadiness on feet (R26.81);Other abnormalities of gait and mobility (R26.89);Other symptoms and signs involving cognitive function   Activity Tolerance Patient tolerated treatment well   Patient Left in bed;with call bell/phone within reach (EOB with SLP)   Nurse Communication Mobility status        Time: 9629-5284 OT Time Calculation (min): 21 min  Charges: OT General Charges $OT Visit: 1 Visit OT Treatments $Self Care/Home Management : 8-22 mins  Bruce Nunez, OTR/L [facility] Acute Rehabilitation Office: [phone]   Bruce Nunez 07/16/2023, 5:41 PM

## 2023-07-17 ENCOUNTER — Ambulatory Visit: Payer: Medicare Other

## 2023-07-17 ENCOUNTER — Other Ambulatory Visit: Payer: Self-pay

## 2023-07-17 ENCOUNTER — Inpatient Hospital Stay (HOSPITAL_COMMUNITY)
Admission: RE | Disposition: A | Discharge status: Skilled Nursing Facility | Payer: Self-pay | Source: Home / Self Care | Attending: Thoracic Surgery (Cardiothoracic Vascular Surgery)

## 2023-07-17 DIAGNOSIS — I441 Atrioventricular block, second degree: Secondary | ICD-10-CM | POA: Diagnosis not present

## 2023-07-17 HISTORY — PX: PACEMAKER IMPLANT: EP1218

## 2023-07-17 LAB — SURGICAL PCR SCREEN
MRSA, PCR: NEGATIVE
Staphylococcus aureus: NEGATIVE

## 2023-07-17 LAB — BASIC METABOLIC PANEL
Anion gap: 8 (ref 5–15)
BUN: 10 mg/dL (ref 8–23)
CO2: 26 mmol/L (ref 22–32)
Calcium: 8.6 mg/dL — ABNORMAL LOW (ref 8.9–10.3)
Chloride: 104 mmol/L (ref 98–111)
Creatinine, Ser: 0.92 mg/dL (ref 0.61–1.24)
GFR, Estimated: >60 mL/min (ref >60)
Glucose, Bld: 120 mg/dL — ABNORMAL HIGH (ref 70–99)
Potassium: 3.5 mmol/L (ref 3.5–5.1)
Sodium: 138 mmol/L (ref 135–145)

## 2023-07-17 SURGERY — PACEMAKER IMPLANT

## 2023-07-17 MED ORDER — SODIUM CHLORIDE 0.9 % IV SOLN
80.0000 mg | INTRAVENOUS | Status: AC
  Filled 2023-07-17: qty 2

## 2023-07-17 MED ORDER — LIDOCAINE HCL (PF) 1 % IJ SOLN
INTRAMUSCULAR | Status: AC
Start: 2023-07-17 — End: ?
  Filled 2023-07-17: qty 60

## 2023-07-17 MED ORDER — VANCOMYCIN HCL IN DEXTROSE 1-5 GM/200ML-% IV SOLN
1000.0000 mg | Freq: Two times a day (BID) | INTRAVENOUS | Status: AC
  Administered 2023-07-18: 1000 mg via INTRAVENOUS
  Filled 2023-07-17: qty 200

## 2023-07-17 MED ORDER — CHLORHEXIDINE GLUCONATE 4 % EX SOLN: 60.0000 mL | Freq: Once | CUTANEOUS | Status: DC

## 2023-07-17 MED ORDER — HEPARIN (PORCINE) IN NACL 1000-0.9 UT/500ML-% IV SOLN
INTRAVENOUS | Status: DC | PRN
  Administered 2023-07-17: 500 mL

## 2023-07-17 MED ORDER — CHLORHEXIDINE GLUCONATE 4 % EX SOLN
60.0000 mL | Freq: Once | CUTANEOUS | Status: AC
  Administered 2023-07-17: 4 via TOPICAL
  Filled 2023-07-17: qty 15

## 2023-07-17 MED ORDER — VANCOMYCIN HCL IN DEXTROSE 1-5 GM/200ML-% IV SOLN: 1000.0000 mg | INTRAVENOUS | Status: AC

## 2023-07-17 MED ORDER — MIDAZOLAM HCL 2 MG/2ML IJ SOLN
INTRAMUSCULAR | Status: AC
  Filled 2023-07-17: qty 2

## 2023-07-17 MED ORDER — SODIUM CHLORIDE 0.9% FLUSH
3.0000 mL | Freq: Two times a day (BID) | INTRAVENOUS | Status: DC
Start: 2023-07-17 — End: 2023-07-20
  Administered 2023-07-17 – 2023-07-20 (×2): 3 mL via INTRAVENOUS

## 2023-07-17 MED ORDER — MIDAZOLAM HCL 5 MG/5ML IJ SOLN
INTRAMUSCULAR | Status: DC | PRN
  Administered 2023-07-17 (×2): 1 mg via INTRAVENOUS

## 2023-07-17 MED ORDER — LIDOCAINE HCL (PF) 1 % IJ SOLN
INTRAMUSCULAR | Status: DC | PRN
  Administered 2023-07-17: 55 mL

## 2023-07-17 MED ORDER — FENTANYL CITRATE (PF) 100 MCG/2ML IJ SOLN
INTRAMUSCULAR | Status: DC | PRN
  Administered 2023-07-17 (×2): 25 ug via INTRAVENOUS

## 2023-07-17 MED ORDER — SODIUM CHLORIDE 0.9% FLUSH
3.0000 mL | Freq: Two times a day (BID) | INTRAVENOUS | Status: DC
  Administered 2023-07-17 – 2023-07-20 (×6): 3 mL via INTRAVENOUS

## 2023-07-17 MED ORDER — ACETAMINOPHEN 325 MG PO TABS
325.0000 mg | ORAL_TABLET | ORAL | Status: DC | PRN
  Administered 2023-07-19: 650 mg via ORAL
  Filled 2023-07-17: qty 2

## 2023-07-17 MED ORDER — SODIUM CHLORIDE 0.9 % IV SOLN
INTRAVENOUS | Status: AC
  Administered 2023-07-17: 80 mg
  Filled 2023-07-17: qty 2

## 2023-07-17 MED ORDER — POTASSIUM CHLORIDE CRYS ER 20 MEQ PO TBCR
40.0000 meq | EXTENDED_RELEASE_TABLET | Freq: Once | ORAL | Status: AC
  Administered 2023-07-17: 40 meq via ORAL
  Filled 2023-07-17: qty 2

## 2023-07-17 MED ORDER — FENTANYL CITRATE (PF) 100 MCG/2ML IJ SOLN
INTRAMUSCULAR | Status: AC
  Filled 2023-07-17: qty 2

## 2023-07-17 MED ORDER — POTASSIUM CHLORIDE CRYS ER 20 MEQ PO TBCR: 40.0000 meq | EXTENDED_RELEASE_TABLET | Freq: Once | ORAL | Status: DC

## 2023-07-17 MED ORDER — VANCOMYCIN HCL IN DEXTROSE 1-5 GM/200ML-% IV SOLN
INTRAVENOUS | Status: AC
  Administered 2023-07-17: 1000 mg via INTRAVENOUS
  Filled 2023-07-17: qty 200

## 2023-07-17 MED FILL — Mannitol IV Soln 20%: INTRAVENOUS | Qty: 500 | Status: AC

## 2023-07-17 MED FILL — Sodium Bicarbonate IV Soln 8.4%: INTRAVENOUS | Qty: 50 | Status: AC

## 2023-07-17 MED FILL — Electrolyte-R (PH 7.4) Solution: INTRAVENOUS | Qty: 4000 | Status: AC

## 2023-07-17 MED FILL — Albumin, Human Inj 5%: INTRAVENOUS | Qty: 250 | Status: AC

## 2023-07-17 MED FILL — Heparin Sodium (Porcine) Inj 1000 Unit/ML: INTRAMUSCULAR | Qty: 20 | Status: AC

## 2023-07-17 MED FILL — Calcium Chloride Inj 10%: INTRAVENOUS | Qty: 10 | Status: AC

## 2023-07-17 MED FILL — Sodium Chloride IV Soln 0.9%: INTRAVENOUS | Qty: 3000 | Status: AC

## 2023-07-17 SURGICAL SUPPLY — 14 items
CABLE SURGICAL S-101-97-12 (CABLE) ×1 IMPLANT
CATH CPS LOCATOR 3D MED (CATHETERS) IMPLANT
HELIX LOCKING TOOL (MISCELLANEOUS) ×1
LEAD ULTIPACE 52 LPA1231/52 (Lead) IMPLANT
LEAD ULTIPACE 65 LPA1231/65 (Lead) IMPLANT
LOCATOR PLUS 1281 (MISCELLANEOUS) IMPLANT
PACEMAKER ASSURITY DR-RF (Pacemaker) IMPLANT
PAD DEFIB RADIO PHYSIO CONN (PAD) ×1 IMPLANT
SHEATH 7FR PRELUDE SNAP 13 (SHEATH) IMPLANT
SHEATH 9FR PRELUDE SNAP 13 (SHEATH) IMPLANT
SLITTER AGILIS HISPRO (INSTRUMENTS) IMPLANT
TOOL HELIX LOCKING (MISCELLANEOUS) IMPLANT
TRAY PACEMAKER INSERTION (PACKS) ×1 IMPLANT
WIRE HI TORQ VERSACORE-J 145CM (WIRE) IMPLANT

## 2023-07-17 NOTE — Progress Notes (Signed)
Mobility Specialist Progress Note:   07/17/23 1244  Mobility  Activity Ambulated with assistance in room  Level of Assistance Minimal assist, patient does 75% or more  Assistive Device Front wheel walker  Distance Ambulated (ft) 30 ft  RUE Weight Bearing NWB  LUE Weight Bearing NWB  Activity Response Tolerated well  Mobility Referral Yes  $Mobility charge 1 Mobility  Mobility Specialist Start Time (ACUTE ONLY) 1145  Mobility Specialist Stop Time (ACUTE ONLY) 1213  Mobility Specialist Time Calculation (min) (ACUTE ONLY) 28 min   During Mobility: 116 HR  Post Mobility: 102 HR   Pt received in bed, requesting assistance to BR. Pt showing improvements in gait stability not requiring any verbal cues with RW. No BM present. Pt declining to sit in chair d/t discomfort. Pt returned to bed with call bell in reach and all needs met. Bed alarm on.  [name]  Mobility Specialist Please contact via [employer] Rehab office at [phone]

## 2023-07-17 NOTE — Plan of Care (Signed)
  Problem: Education: Goal: Knowledge of disease or condition will improve Outcome: Progressing Goal: Knowledge of the prescribed therapeutic regimen will improve Outcome: Progressing   Problem: Activity: Goal: Risk for activity intolerance will decrease Outcome: Progressing

## 2023-07-17 NOTE — Progress Notes (Signed)
Pt refused AM aspirin. Pt states he is allergic. Allergy noted with comment of "GI bleeding - avoids". Per [facility] pt has been receiving med. Aloha Gell, PA notified. No new orders at this time.

## 2023-07-17 NOTE — Interval H&P Note (Signed)
History and Physical Interval Note:  07/17/2023 10:20 AM  Bruce Nunez  has presented today for surgery, with the diagnosis of heart block.  The various methods of treatment have been discussed with the patient and family. After consideration of risks, benefits and other options for treatment, the patient has consented to  Procedure(s): PACEMAKER IMPLANT (N/A) as a surgical intervention.  The patient's history has been reviewed, patient examined, no change in status, stable for surgery.  I have reviewed the patient's chart and labs.  Questions were answered to the patient's satisfaction.     Talor Cheema [employer]

## 2023-07-17 NOTE — Progress Notes (Addendum)
Pt returned to room 4E02 from cath lab. Spouse, Armando Reichert, updated and to bedside.   Upon pt return to 4E02, pt with incision to left chest with non-adherent pad and transparent dressing. Scant sanguinous drainage noted and marked.   Received report from [place], Charity fundraiser - reports dual chamber pacemaker settings are DDD 60-120.

## 2023-07-17 NOTE — Progress Notes (Addendum)
Rounding Note    Patient Name: Bruce Nunez Date of Encounter: 07/17/2023  [facility] Cardiologist: Dr. Dulce Sellar  Subjective   Feels well, NAEO, hoping to get out of the hospital soon  Inpatient Medications    Scheduled Meds:  acetaminophen  1,000 mg Oral Q6H   arformoterol  15 mcg Nebulization BID   aspirin  81 mg Oral Daily   budesonide (PULMICORT) nebulizer solution  0.25 mg Nebulization BID   docusate sodium  200 mg Oral Daily   feeding supplement  237 mL Oral TID WC   fesoterodine  4 mg Oral Daily   fluticasone  1 spray Each Nare Daily   levothyroxine  75 mcg Oral Q0600   pantoprazole  40 mg Oral Daily   potassium chloride  20 mEq Oral Daily   potassium chloride  40 mEq Oral Once   QUEtiapine  25 mg Oral QHS   rosuvastatin  20 mg Oral Daily   tamsulosin  0.4 mg Oral BID   Continuous Infusions:  PRN Meds: albuterol, dextrose, dicyclomine, guaiFENesin, hydrALAZINE, methocarbamol, metoprolol tartrate, ondansetron (ZOFRAN) IV, mouth rinse, senna-docusate, sodium chloride flush   Vital Signs    Vitals:   07/17/23 0336 07/17/23 0342 07/17/23 0739 07/17/23 0740  BP:  (!) 125/59    Pulse:   (!) 104   Resp:  18 16   Temp:  97.9 F (36.6 C)    TempSrc:  Oral    SpO2:   98% 98%  Weight: 65.5 kg     Height:        Intake/Output Summary (Last 24 hours) at 07/17/2023 0746 Last data filed at 07/16/2023 1631 Gross per 24 hour  Intake 480 ml  Output 600 ml  Net -120 ml      07/17/2023    3:36 AM 07/16/2023    4:08 AM 07/15/2023    2:45 AM  Last 3 Weights  Weight (lbs) 144 lb 6.4 oz 151 lb 7.3 oz 151 lb 3.8 oz  Weight (kg) 65.499 kg 68.7 kg 68.6 kg      Telemetry    SR, conducting 1:1 with 1st dgree AVblock, PVCs intermittently are frequent with couplets as some NSVTs 4beats longest - Personally Reviewed  ECG    No new EKGs - Personally Reviewed  Physical Exam   Exam this morning is unchanged GEN: No acute distress.   Neck: No  JVD Cardiac: RRR, no murmurs, rubs, or gallops.  Respiratory: CTA b/l. GI: Soft, nontender, non-distended  MS: No edema; No deformity. Neuro:  Nonfocal  Psych: Normal affect   Labs    High Sensitivity Troponin:  No results for input(s): "TROPONINIHS" in the last 720 hours.   Chemistry Recent Labs  Lab 07/11/23 0437 07/13/23 0326 07/17/23 0433  NA 133* 135 138  K 3.8 3.6 3.5  CL 102 102 104  CO2 23 19* 26  GLUCOSE 116* 94 120*  BUN 18 21 10   CREATININE 1.28* 1.19 0.92  CALCIUM 8.4* 8.6* 8.6*  GFRNONAA 56* >60 >60  ANIONGAP 8 14 8     Lipids  Recent Labs  Lab 07/11/23 0437  CHOL 74  TRIG 149  HDL 11*  LDLCALC 33  CHOLHDL 6.7    Hematology No results for input(s): "WBC", "RBC", "HGB", "HCT", "MCV", "MCH", "MCHC", "RDW", "PLT" in the last 168 hours.  Thyroid No results for input(s): "TSH", "FREET4" in the last 168 hours.  BNPNo results for input(s): "BNP", "PROBNP" in the last 168 hours.  DDimer  No results for input(s): "DDIMER" in the last 168 hours.   Radiology      Cardiac Studies   07/16/23: TTE (limited)  1. Left ventricular ejection fraction, by estimation, is 60 to 65%. The  left ventricle has normal function. The left ventricle has no regional  wall motion abnormalities. Left ventricular diastolic function could not  be evaluated.   2. Right ventricular systolic function is normal. The right ventricular  size is normal. Tricuspid regurgitation signal is inadequate for assessing  PA pressure.   3. The mitral valve is normal in structure. No evidence of mitral valve  regurgitation. No evidence of mitral stenosis.   4. The aortic valve has been repaired/replaced. Aortic valve  regurgitation is not visualized. No aortic stenosis is present. There is a  21 mm Inspiris Resilia bioprosthetic valve present in the aortic position.  Aortic valve area, by VTI measures 2.26  cm. Aortic valve mean gradient measures 6.0 mmHg. Aortic valve Vmax  measures 1.68  m/s.   5. The inferior vena cava is normal in size with greater than 50%  respiratory variability, suggesting right atrial pressure of 3 mmHg.   6. Trivial pericardial effusion is present. The pericardial effusion is  surrounding the apex.   07/05/23: inter-op TEE POST-OP IMPRESSIONS  _ Left Ventricle: The left ventricle is unchanged from pre-bypass.  _ Right Ventricle: The right ventricle appears unchanged from pre-bypass.  _ Aorta: The aorta appears unchanged from pre-bypass.  _ Left Atrial Appendage: The left atrial appendage appears unchanged from  pre-bypass.  _ Aortic Valve: A bioprosthetic valve was placed, leaflets are freely  mobile  Size; 21mm. The gradient recorded across the prosthetic valve is within  the  expected range. No perivalvular leak noted. Mean PG  _ Mitral Valve: The mitral valve appears unchanged from pre-bypass.  _ Tricuspid Valve: The tricuspid valve appears unchanged from pre-bypass.  _ Pulmonic Valve: The pulmonic valve appears unchanged from pre-bypass.  _ Interatrial Septum: The interatrial septum appears unchanged from  pre-bypass.  _ Pericardium: The pericardium appears unchanged from pre-bypass.  _ Comments: S/P AVR CABG. No new or worsening wall motion or valvular  abnormalities post procedure. 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 Seated appropriately without rock or leak. Gradients WNL.   PRE-OP FINDINGS   Left Ventricle: The left ventricle has normal systolic function, with an  ejection fraction of 60-65%. The cavity size was normal. Left ventricular  diastolic parameters are consistent with Grade I diastolic dysfunction  (impaired relaxation).    Right Ventricle: The right ventricle has normal systolic function. The  cavity was normal. There is no increase in right ventricular wall  thickness. Right ventricular systolic pressure is normal.   Left Atrium: Left atrial size was normal in size. No left atrial/left   atrial appendage thrombus was detected. Left atrial appendage velocity is  normal at greater than 40 cm/s.   Right Atrium: Right atrial size was normal in size.   Interatrial Septum: No atrial level shunt detected by color flow Doppler.  The interatrial septum appears to be lipomatous. There is no evidence of a  patent foramen ovale.   Pericardium: There is no evidence of pericardial effusion. There is no  pleural effusion.   Mitral Valve: The mitral valve is normal in structure. Mitral valve  regurgitation is trivial by color flow Doppler. There is no evidence of  mitral valve vegetation. There is No evidence of mitral stenosis.  Tricuspid Valve: The tricuspid valve was normal in structure. Tricuspid  valve regurgitation is trivial by color flow Doppler. No evidence of  tricuspid stenosis is present. There is no evidence of tricuspid valve  vegetation.   Aortic Valve: The aortic valve is tricuspid. Aortic valve regurgitation is  moderate to severe. The jet is eccentric anteriorly directed. There is  mild stenosis of the aortic valve, with a calculated valve area of 1.40  cm.  AI PHT , VC .7cm, diastolic reversal descending thoracic aorta, EROA  PISA .32cm2.   Pulmonic Valve: The pulmonic valve was normal in structure, with normal.  No evidence of pumonic stenosis.  Pulmonic valve regurgitation is mild by color flow Doppler.    Aorta: The aortic root and ascending aorta are normal in size and  structure.   Pulmonary Artery: Theone Murdoch catheter present on the right. The pulmonary  artery is of normal size.   Shunts: There is no evidence of an atrial septal defect.      05/03/23: LHC  Prox RCA to Mid RCA lesion is 50% stenosed.   Mid LM to Dist LM lesion is 30% stenosed.   Prox Cx to Dist Cx lesion is 50% stenosed.   Prox LAD to Mid LAD lesion is 50% stenosed.   1st Diag lesion is 60% stenosed.   There is severe (4+) aortic regurgitation.   1.  Moderate  multivessel coronary artery disease with mild distal left main plaquing of 30%, moderate diffuse LAD stenosis of 50%, mild to moderate mid circumflex stenosis of 40 to 50%, and moderate mid RCA stenosis of 50%. 2.  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  Severe right subclavian tortuosity makes cardiac catheterization very difficult from right radial access.  If patient requires future cardiac catheterization, avoid right radial access.   Recommendations: Heart team review, cardiac CTA (TAVR protocol) to evaluate if there is enough aortic valve calcification to perform TAVR as aortic insufficiency appears to be the primary valve lesion.  The patient has a very wide pulse pressure and angiographically has severe AI.  Patient Profile     82 y.o. 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  48hours off nodal blocking agents with notable improvement in his conduction, though PVC burden has risen with some NSVTs as well. Very likely to see AFib for him again down the road as well  Recommend PPM implant. Dr. Jimmey Ralph has seen and discussed with the patient rational for PPM, the procedure, potential risks/benefit and he is agreeable  Post pacing would resume high dose BB to his BP tolerance (toprol 50mg  BID if able) Resume Eliquis post pacing, pending pocket stability CTS team updated   For questions or updates, please contact [facility] Please consult [email or web address] for contact info under        Signed, Sheilah Pigeon, PA-C  07/17/2023, 7:46 AM    I have seen and examined this patient with Francis Dowse.  Agree with above, note added to reflect my findings.  Patient  feeling well today without major complaint.  GEN: Well nourished, well developed, in no acute distress  HEENT: normal  Neck: no JVD, carotid bruits, or masses Cardiac: RRR; no murmurs,  rubs, or gallops,no edema  Respiratory:  clear to auscultation bilaterally, normal work of breathing GI: soft, nontender, nondistended, + BS MS: no deformity or atrophy  Skin: warm and dry Neuro:  Strength and sensation are intact Psych: euthymic mood, full affect   1.  Advanced conduction system disease: Patient is post AVR CABG.  Has a new right bundle branch block and variable AV nodal conduction.  He also has atrial fibrillation PVCs and Sione Baumgarten need rate controlling medications.  Due to that, he would benefit from pacemaker implant.  Risk and benefits have been discussed.  He understands the risks and is agreed to the procedure.  Jetta Lout has presented today for surgery, with the diagnosis of second degree Av block.  The various methods of treatment have been discussed with the patient and family. After consideration of risks, benefits and other options for treatment, the patient has consented to  Procedure(s): Pacemaker implant as a surgical intervention .  Risks include but not limited to bleeding, infection, pneumothorax, perforation, tamponade, vascular damage, renal failure, MI, stroke, death, and lead dislodgement . The patient's history has been reviewed, patient examined, no change in status, stable for surgery.  I have reviewed the patient's chart and labs.  Questions were answered to the patient's satisfaction.    Marquisha Nikolov Elberta Fortis, MD 07/17/2023 10:17 AM

## 2023-07-17 NOTE — H&P (View-Only) (Signed)
Rounding Note    Patient Name: Bruce Nunez Date of Encounter: 07/17/2023  [facility] Cardiologist: Dr. Dulce Sellar  Subjective   Feels well, NAEO, hoping to get out of the hospital soon  Inpatient Medications    Scheduled Meds:  acetaminophen  1,000 mg Oral Q6H   arformoterol  15 mcg Nebulization BID   aspirin  81 mg Oral Daily   budesonide (PULMICORT) nebulizer solution  0.25 mg Nebulization BID   docusate sodium  200 mg Oral Daily   feeding supplement  237 mL Oral TID WC   fesoterodine  4 mg Oral Daily   fluticasone  1 spray Each Nare Daily   levothyroxine  75 mcg Oral Q0600   pantoprazole  40 mg Oral Daily   potassium chloride  20 mEq Oral Daily   potassium chloride  40 mEq Oral Once   QUEtiapine  25 mg Oral QHS   rosuvastatin  20 mg Oral Daily   tamsulosin  0.4 mg Oral BID   Continuous Infusions:  PRN Meds: albuterol, dextrose, dicyclomine, guaiFENesin, hydrALAZINE, methocarbamol, metoprolol tartrate, ondansetron (ZOFRAN) IV, mouth rinse, senna-docusate, sodium chloride flush   Vital Signs    Vitals:   07/17/23 0336 07/17/23 0342 07/17/23 0739 07/17/23 0740  BP:  (!) 125/59    Pulse:   (!) 104   Resp:  18 16   Temp:  97.9 F (36.6 C)    TempSrc:  Oral    SpO2:   98% 98%  Weight: 65.5 kg     Height:        Intake/Output Summary (Last 24 hours) at 07/17/2023 0746 Last data filed at 07/16/2023 1631 Gross per 24 hour  Intake 480 ml  Output 600 ml  Net -120 ml      07/17/2023    3:36 AM 07/16/2023    4:08 AM 07/15/2023    2:45 AM  Last 3 Weights  Weight (lbs) 144 lb 6.4 oz 151 lb 7.3 oz 151 lb 3.8 oz  Weight (kg) 65.499 kg 68.7 kg 68.6 kg      Telemetry    SR, conducting 1:1 with 1st dgree AVblock, PVCs intermittently are frequent with couplets as some NSVTs 4beats longest - Personally Reviewed  ECG    No new EKGs - Personally Reviewed  Physical Exam   Exam this morning is unchanged GEN: No acute distress.   Neck: No  JVD Cardiac: RRR, no murmurs, rubs, or gallops.  Respiratory: CTA b/l. GI: Soft, nontender, non-distended  MS: No edema; No deformity. Neuro:  Nonfocal  Psych: Normal affect   Labs    High Sensitivity Troponin:  No results for input(s): "TROPONINIHS" in the last 720 hours.   Chemistry Recent Labs  Lab 07/11/23 0437 07/13/23 0326 07/17/23 0433  NA 133* 135 138  K 3.8 3.6 3.5  CL 102 102 104  CO2 23 19* 26  GLUCOSE 116* 94 120*  BUN 18 21 10   CREATININE 1.28* 1.19 0.92  CALCIUM 8.4* 8.6* 8.6*  GFRNONAA 56* >60 >60  ANIONGAP 8 14 8     Lipids  Recent Labs  Lab 07/11/23 0437  CHOL 74  TRIG 149  HDL 11*  LDLCALC 33  CHOLHDL 6.7    Hematology No results for input(s): "WBC", "RBC", "HGB", "HCT", "MCV", "MCH", "MCHC", "RDW", "PLT" in the last 168 hours.  Thyroid No results for input(s): "TSH", "FREET4" in the last 168 hours.  BNPNo results for input(s): "BNP", "PROBNP" in the last 168 hours.  DDimer  No results for input(s): "DDIMER" in the last 168 hours.   Radiology      Cardiac Studies   07/16/23: TTE (limited)  1. Left ventricular ejection fraction, by estimation, is 60 to 65%. The  left ventricle has normal function. The left ventricle has no regional  wall motion abnormalities. Left ventricular diastolic function could not  be evaluated.   2. Right ventricular systolic function is normal. The right ventricular  size is normal. Tricuspid regurgitation signal is inadequate for assessing  PA pressure.   3. The mitral valve is normal in structure. No evidence of mitral valve  regurgitation. No evidence of mitral stenosis.   4. The aortic valve has been repaired/replaced. Aortic valve  regurgitation is not visualized. No aortic stenosis is present. There is a  21 mm Inspiris Resilia bioprosthetic valve present in the aortic position.  Aortic valve area, by VTI measures 2.26  cm. Aortic valve mean gradient measures 6.0 mmHg. Aortic valve Vmax  measures 1.68  m/s.   5. The inferior vena cava is normal in size with greater than 50%  respiratory variability, suggesting right atrial pressure of 3 mmHg.   6. Trivial pericardial effusion is present. The pericardial effusion is  surrounding the apex.   07/05/23: inter-op TEE POST-OP IMPRESSIONS  _ Left Ventricle: The left ventricle is unchanged from pre-bypass.  _ Right Ventricle: The right ventricle appears unchanged from pre-bypass.  _ Aorta: The aorta appears unchanged from pre-bypass.  _ Left Atrial Appendage: The left atrial appendage appears unchanged from  pre-bypass.  _ Aortic Valve: A bioprosthetic valve was placed, leaflets are freely  mobile  Size; 21mm. The gradient recorded across the prosthetic valve is within  the  expected range. No perivalvular leak noted. Mean PG  _ Mitral Valve: The mitral valve appears unchanged from pre-bypass.  _ Tricuspid Valve: The tricuspid valve appears unchanged from pre-bypass.  _ Pulmonic Valve: The pulmonic valve appears unchanged from pre-bypass.  _ Interatrial Septum: The interatrial septum appears unchanged from  pre-bypass.  _ Pericardium: The pericardium appears unchanged from pre-bypass.  _ Comments: S/P AVR CABG. No new or worsening wall motion or valvular  abnormalities post procedure. 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 Seated appropriately without rock or leak. Gradients WNL.   PRE-OP FINDINGS   Left Ventricle: The left ventricle has normal systolic function, with an  ejection fraction of 60-65%. The cavity size was normal. Left ventricular  diastolic parameters are consistent with Grade I diastolic dysfunction  (impaired relaxation).    Right Ventricle: The right ventricle has normal systolic function. The  cavity was normal. There is no increase in right ventricular wall  thickness. Right ventricular systolic pressure is normal.   Left Atrium: Left atrial size was normal in size. No left atrial/left   atrial appendage thrombus was detected. Left atrial appendage velocity is  normal at greater than 40 cm/s.   Right Atrium: Right atrial size was normal in size.   Interatrial Septum: No atrial level shunt detected by color flow Doppler.  The interatrial septum appears to be lipomatous. There is no evidence of a  patent foramen ovale.   Pericardium: There is no evidence of pericardial effusion. There is no  pleural effusion.   Mitral Valve: The mitral valve is normal in structure. Mitral valve  regurgitation is trivial by color flow Doppler. There is no evidence of  mitral valve vegetation. There is No evidence of mitral stenosis.  Tricuspid Valve: The tricuspid valve was normal in structure. Tricuspid  valve regurgitation is trivial by color flow Doppler. No evidence of  tricuspid stenosis is present. There is no evidence of tricuspid valve  vegetation.   Aortic Valve: The aortic valve is tricuspid. Aortic valve regurgitation is  moderate to severe. The jet is eccentric anteriorly directed. There is  mild stenosis of the aortic valve, with a calculated valve area of 1.40  cm.  AI PHT , VC .7cm, diastolic reversal descending thoracic aorta, EROA  PISA .32cm2.   Pulmonic Valve: The pulmonic valve was normal in structure, with normal.  No evidence of pumonic stenosis.  Pulmonic valve regurgitation is mild by color flow Doppler.    Aorta: The aortic root and ascending aorta are normal in size and  structure.   Pulmonary Artery: Theone Murdoch catheter present on the right. The pulmonary  artery is of normal size.   Shunts: There is no evidence of an atrial septal defect.      05/03/23: LHC  Prox RCA to Mid RCA lesion is 50% stenosed.   Mid LM to Dist LM lesion is 30% stenosed.   Prox Cx to Dist Cx lesion is 50% stenosed.   Prox LAD to Mid LAD lesion is 50% stenosed.   1st Diag lesion is 60% stenosed.   There is severe (4+) aortic regurgitation.   1.  Moderate  multivessel coronary artery disease with mild distal left main plaquing of 30%, moderate diffuse LAD stenosis of 50%, mild to moderate mid circumflex stenosis of 40 to 50%, and moderate mid RCA stenosis of 50%. 2.  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  Severe right subclavian tortuosity makes cardiac catheterization very difficult from right radial access.  If patient requires future cardiac catheterization, avoid right radial access.   Recommendations: Heart team review, cardiac CTA (TAVR protocol) to evaluate if there is enough aortic valve calcification to perform TAVR as aortic insufficiency appears to be the primary valve lesion.  The patient has a very wide pulse pressure and angiographically has severe AI.  Patient Profile     82 y.o. 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  48hours off nodal blocking agents with notable improvement in his conduction, though PVC burden has risen with some NSVTs as well. Very likely to see AFib for him again down the road as well  Recommend PPM implant. Dr. Jimmey Ralph has seen and discussed with the patient rational for PPM, the procedure, potential risks/benefit and he is agreeable  Post pacing would resume high dose BB to his BP tolerance (toprol 50mg  BID if able) Resume Eliquis post pacing, pending pocket stability CTS team updated   For questions or updates, please contact [facility] Please consult [email or web address] for contact info under        Signed, Sheilah Pigeon, PA-C  07/17/2023, 7:46 AM    I have seen and examined this patient with Francis Dowse.  Agree with above, note added to reflect my findings.  Patient  feeling well today without major complaint.  GEN: Well nourished, well developed, in no acute distress  HEENT: normal  Neck: no JVD, carotid bruits, or masses Cardiac: RRR; no murmurs,  rubs, or gallops,no edema  Respiratory:  clear to auscultation bilaterally, normal work of breathing GI: soft, nontender, nondistended, + BS MS: no deformity or atrophy  Skin: warm and dry Neuro:  Strength and sensation are intact Psych: euthymic mood, full affect   1.  Advanced conduction system disease: Patient is post AVR CABG.  Has a new right bundle branch block and variable AV nodal conduction.  He also has atrial fibrillation PVCs and Sione Baumgarten need rate controlling medications.  Due to that, he would benefit from pacemaker implant.  Risk and benefits have been discussed.  He understands the risks and is agreed to the procedure.  Jetta Lout has presented today for surgery, with the diagnosis of second degree Av block.  The various methods of treatment have been discussed with the patient and family. After consideration of risks, benefits and other options for treatment, the patient has consented to  Procedure(s): Pacemaker implant as a surgical intervention .  Risks include but not limited to bleeding, infection, pneumothorax, perforation, tamponade, vascular damage, renal failure, MI, stroke, death, and lead dislodgement . The patient's history has been reviewed, patient examined, no change in status, stable for surgery.  I have reviewed the patient's chart and labs.  Questions were answered to the patient's satisfaction.    Marquisha Nikolov Elberta Fortis, MD 07/17/2023 10:17 AM

## 2023-07-17 NOTE — Progress Notes (Addendum)
[address]       [place] [id number]             [phone]      12 Days Post-Op Procedure(s) (LRB): AORTIC VALVE REPLACEMENT WITH INSPIRIS AORTIC VALVE (N/A) CORONARY ARTERY BYPASS GRAFTING, USING LEFT INTERNAL MAMMARY ARTERY (N/A) TRANSESOPHAGEAL ECHOCARDIOGRAM (N/A) Subjective: The patient is alert and oriented x 3 this AM. States he feels the best he has in a week.   Objective: Vital signs in last 24 hours: Temp:  [97.4 F (36.3 C)-98.2 F (36.8 C)] 97.9 F (36.6 C) (11/20 0342) Pulse Rate:  [63-100] 100 (11/19 1923) Cardiac Rhythm: Normal sinus rhythm;Bundle branch block;Heart block (11/19 1901) Resp:  [16-22] 18 (11/20 0342) BP: (125-139)/(59-97) 125/59 (11/20 0342) SpO2:  [95 %-100 %] 98 % (11/19 1923) Weight:  [65.5 kg] 65.5 kg (11/20 0336)  Hemodynamic parameters for last 24 hours:    Intake/Output from previous day: 11/19 0701 - 11/20 0700 In: 480 [P.O.:480] Out: 600 [Urine:600] Intake/Output this shift: No intake/output data recorded.  General appearance: alert, cooperative, and no distress Neurologic: overall intact, no acute abnormalities, confusion at times  but alert and oriented x 3 this AM Heart: regular rate and rhythm-sinus tachycardia with PVCs, no murmur Lungs: diminished bibasilar breath sounds this AM Abdomen: soft, non-tender; bowel sounds normal; no masses,  no organomegaly Extremities: extremities normal, atraumatic, no cyanosis or edema Wound: Clean and dry without sign of infection  Lab Results: No results for input(s): "WBC", "HGB", "HCT", "PLT" in the last 72 hours. BMET:  Recent Labs    07/17/23 0433  NA 138  K 3.5  CL 104  CO2 26  GLUCOSE 120*  BUN 10  CREATININE 0.92  CALCIUM 8.6*    PT/INR: No results for input(s): "LABPROT", "INR" in the last 72 hours. ABG    Component Value Date/Time   PHART 7.363 07/05/2023 2208   HCO3 21.2 07/05/2023 2208   TCO2 22 07/05/2023 2208   ACIDBASEDEF 4.0  (H) 07/05/2023 2208   O2SAT 90 07/05/2023 2208   CBG (last 3)  No results for input(s): "GLUCAP" in the last 72 hours.  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 Intermittent confusion at times. PT/OT/SLP, to be [facility] at [facility]. On ASA and and Eliquis due to postop afib.   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 EP discontinued Metoprolol and diltiazem for washout. EP following, to get a pacemaker today. 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 NSR with PVCs, HR 90-103 this AM, SBP 125 this AM. Eliquis being held last night and this AM per EP. Echo yesterday with LVEF 60-65%, aortic valve working appropriately.    Pulm: Saturating well on RA. Continue nebs, mucinex PRN, flutter valve and IS. Continue ambulation.    GI: Patient has not had bowel movement since diarrhea 11/14 when stool softeners were discontinued. Restarted daily stool softeners. NPO for possible pacemaker today if needed.    Endo: DM on Glimepiride at home, will restart at discharge since pt had not had a great appetite. Hypothyroid, on levothyroxine.    Renal: Cr 0.92. Below preop weight, will d/c Lasix. K 3.5, increase supplementation. Continue Flomax   Deconditioning: Continue PT/OT/SLP, SNF at discharge   Dispo:  Plan for PPM per EP today. Hopefully can d/c to SNF tomorrow if cleared by EP.   LOS: 12 days    Jenny Reichmann, PA-C 07/17/2023

## 2023-07-17 NOTE — TOC Progression Note (Signed)
Transition of Care [facility]) - Progression Note    Patient Details  Name: Bruce Nunez MRN: [id number] Date of Birth: Dec 30, 1940  [facility]) CM/SW Contact  Eduard Roux, [place] Phone Number: 07/17/2023, 9:45 AM  Clinical Narrative:     CSW informed patient scheduled for pacemaker today at 5pm. Updated [facility], patient not stable for discharge. Patient will need re-authorization for SNF once medially stable.   TOC continues to follow and assist with discharge planning.  Antony Blackbird, MSW, LCSW Clinical Social Worker    Expected Discharge Plan: Home w Home Health Services Barriers to Discharge: Barriers Resolved  Expected Discharge Plan and Services   Discharge Planning Services: CM Consult Post Acute Care Choice: IP Rehab Living arrangements for the past 2 months: Single Family Home Expected Discharge Date: 07/15/23                                     Social Determinants of Health (SDOH) Interventions SDOH Screenings   Food Insecurity: No Food Insecurity (07/08/2023)  Housing: Low Risk  (07/08/2023)  Transportation Needs: No Transportation Needs (07/08/2023)  Utilities: Not At Risk (07/08/2023)  Tobacco Use: Low Risk  (07/05/2023)    Readmission Risk Interventions     No data to display

## 2023-07-18 ENCOUNTER — Encounter (HOSPITAL_COMMUNITY): Payer: Self-pay | Admitting: Cardiology

## 2023-07-18 ENCOUNTER — Inpatient Hospital Stay (HOSPITAL_COMMUNITY): Payer: Medicare Other

## 2023-07-18 DIAGNOSIS — I441 Atrioventricular block, second degree: Secondary | ICD-10-CM | POA: Diagnosis not present

## 2023-07-18 MED ORDER — SENNOSIDES-DOCUSATE SODIUM 8.6-50 MG PO TABS
1.0000 | ORAL_TABLET | Freq: Every day | ORAL | Status: DC
  Administered 2023-07-18 – 2023-07-20 (×3): 1 via ORAL
  Filled 2023-07-18 (×3): qty 1

## 2023-07-18 MED ORDER — METOPROLOL SUCCINATE ER 100 MG PO TB24
100.0000 mg | ORAL_TABLET | Freq: Every day | ORAL | Status: DC
  Administered 2023-07-18 – 2023-07-20 (×3): 100 mg via ORAL
  Filled 2023-07-18 (×3): qty 1

## 2023-07-18 MED ORDER — LACTULOSE 10 GM/15ML PO SOLN: 20.0000 g | Freq: Two times a day (BID) | ORAL | Status: DC | PRN

## 2023-07-18 MED ORDER — APIXABAN 5 MG PO TABS: 5.0000 mg | ORAL_TABLET | Freq: Two times a day (BID) | ORAL | Status: DC

## 2023-07-18 MED ORDER — DOCUSATE SODIUM 100 MG PO CAPS
200.0000 mg | ORAL_CAPSULE | Freq: Every day | ORAL | Status: DC
  Administered 2023-07-18 – 2023-07-19 (×2): 200 mg via ORAL
  Filled 2023-07-18 (×2): qty 2

## 2023-07-18 MED FILL — Midazolam HCl Inj 2 MG/2ML (Base Equivalent): INTRAMUSCULAR | Qty: 2 | Status: AC

## 2023-07-18 NOTE — Progress Notes (Signed)
CARDIAC REHAB PHASE I   Pt resting in bed, having some incisional pain. Has just received pain medicine. Pt would like to walk little later today. Pt reports feeling much better physically after PPM placement, even with soreness. Reports improved appetite. [facility] education was completed 11-15. Provided brief review with pt and wife. All questions and concerns addressed. Awaiting discharge to SNF. Will continue to follow.   [id number] Woodroe Chen, RN BSN 07/18/2023 11:14 AM

## 2023-07-18 NOTE — TOC Progression Note (Signed)
Transition of Care [facility]) - Progression Note    Patient Details  Name: Bruce Nunez MRN: [id number] Date of Birth: Oct 02, 1940  Transition of Care [facility]) CM/SW Contact  Eduard Roux, [place] Phone Number: 07/18/2023, 11:58 AM  Clinical Narrative:     Patient will need re-authorization for SNF- will request authorization once patient has been seen by PT.  Antony Blackbird, MSW, LCSW Clinical Social Worker    Expected Discharge Plan: Home w Home Health Services Barriers to Discharge: Barriers Resolved  Expected Discharge Plan and Services   Discharge Planning Services: CM Consult Post Acute Care Choice: IP Rehab Living arrangements for the past 2 months: Single Family Home Expected Discharge Date: 07/15/23                                     Social Determinants of Health (SDOH) Interventions SDOH Screenings   Food Insecurity: No Food Insecurity (07/08/2023)  Housing: Low Risk  (07/08/2023)  Transportation Needs: No Transportation Needs (07/08/2023)  Utilities: Not At Risk (07/08/2023)  Tobacco Use: Low Risk  (07/05/2023)    Readmission Risk Interventions     No data to display

## 2023-07-18 NOTE — Progress Notes (Addendum)
Mobility Specialist Progress Note:   07/18/23 1612  Mobility  Activity Ambulated with assistance in room  Level of Assistance Contact guard assist, steadying assist  Assistive Device Front wheel walker  Distance Ambulated (ft) 90 ft  RUE Weight Bearing NWB  LUE Weight Bearing NWB  Activity Response Tolerated well  Mobility Referral Yes  $Mobility charge 1 Mobility  Mobility Specialist Start Time (ACUTE ONLY) 1548  Mobility Specialist Stop Time (ACUTE ONLY) 1610  Mobility Specialist Time Calculation (min) (ACUTE ONLY) 22 min   Pre Mobility: 94 HR  During Mobility: 107 HR  Post Mobility: 102 HR  Pt received in bed, agreeable to mobility. CG to stand. SB during mobility. Pt only needing one verbal cue to stay within RW. Pt displayed improvement in stability during ambulation with RW. C/o slight lightheadedness, otherwise asx throughout. Pt requesting to brush teeth during session. No LOB present while standing or assistance needed. Pt left in chair with call bell in reach and all needs met. RN notified.  [name]  Mobility Specialist Please contact via [employer] office at [phone]

## 2023-07-18 NOTE — Progress Notes (Addendum)
Rounding Note    Patient Name: Bruce Nunez Date of Encounter: 07/18/2023  [facility] Cardiologist: Dr. Dulce Sellar  Subjective   Tired this morning, didn't sleep well, pacer site is achy, denies CP, SOB  Inpatient Medications    Scheduled Meds:  acetaminophen  1,000 mg Oral Q6H   apixaban  5 mg Oral BID   arformoterol  15 mcg Nebulization BID   aspirin  81 mg Oral Daily   budesonide (PULMICORT) nebulizer solution  0.25 mg Nebulization BID   docusate sodium  200 mg Oral QHS   feeding supplement  237 mL Oral TID WC   fesoterodine  4 mg Oral Daily   fluticasone  1 spray Each Nare Daily   levothyroxine  75 mcg Oral Q0600   metoprolol succinate  100 mg Oral Daily   pantoprazole  40 mg Oral Daily   potassium chloride  20 mEq Oral Daily   QUEtiapine  25 mg Oral QHS   rosuvastatin  20 mg Oral Daily   senna-docusate  1 tablet Oral Daily   sodium chloride flush  3 mL Intravenous Q12H   sodium chloride flush  3 mL Intravenous Q12H   tamsulosin  0.4 mg Oral BID   Continuous Infusions:  PRN Meds: acetaminophen, albuterol, dextrose, dicyclomine, guaiFENesin, hydrALAZINE, lactulose, methocarbamol, metoprolol tartrate, ondansetron (ZOFRAN) IV, mouth rinse, sodium chloride flush   Vital Signs    Vitals:   07/18/23 0006 07/18/23 0345 07/18/23 0403 07/18/23 0753  BP: 135/70 133/75  (!) 152/74  Pulse: (!) 107 (!) 105  (!) 111  Resp: 19 (!) 23  20  Temp: 98 F (36.7 C) 97.9 F (36.6 C)  97.9 F (36.6 C)  TempSrc: Oral Oral  Oral  SpO2: 97% 94%  99%  Weight:   65.1 kg   Height:        Intake/Output Summary (Last 24 hours) at 07/18/2023 0920 Last data filed at 07/18/2023 0513 Gross per 24 hour  Intake 680 ml  Output 600 ml  Net 80 ml      07/18/2023    4:03 AM 07/17/2023    3:36 AM 07/16/2023    4:08 AM  Last 3 Weights  Weight (lbs) 143 lb 8.3 oz 144 lb 6.4 oz 151 lb 7.3 oz  Weight (kg) 65.1 kg 65.499 kg 68.7 kg      Telemetry    SR/V pacing-  Personally Reviewed  ECG    ST 110bpm, V paced (QRS 96ms) - Personally Reviewed  Physical Exam   GEN: No acute distress.   Neck: No JVD Cardiac: RRR, no murmurs, rubs, or gallops.  Respiratory: CTA b/l. GI: Soft, nontender, non-distended  MS: No edema; No deformity. Neuro:  Nonfocal  Psych: Normal affect   PPM site is stable, no bleeding, hematoma, ecchymosis  Labs    High Sensitivity Troponin:  No results for input(s): "TROPONINIHS" in the last 720 hours.   Chemistry Recent Labs  Lab 07/13/23 0326 07/17/23 0433  NA 135 138  K 3.6 3.5  CL 102 104  CO2 19* 26  GLUCOSE 94 120*  BUN 21 10  CREATININE 1.19 0.92  CALCIUM 8.6* 8.6*  GFRNONAA >60 >60  ANIONGAP 14 8    Lipids  No results for input(s): "CHOL", "TRIG", "HDL", "LABVLDL", "LDLCALC", "CHOLHDL" in the last 168 hours.   Hematology No results for input(s): "WBC", "RBC", "HGB", "HCT", "MCV", "MCH", "MCHC", "RDW", "PLT" in the last 168 hours.  Thyroid No results for input(s): "TSH", "FREET4"  in the last 168 hours.  BNPNo results for input(s): "BNP", "PROBNP" in the last 168 hours.  DDimer No results for input(s): "DDIMER" in the last 168 hours.   Radiology      Cardiac Studies   07/16/23: TTE (limited)  1. Left ventricular ejection fraction, by estimation, is 60 to 65%. The  left ventricle has normal function. The left ventricle has no regional  wall motion abnormalities. Left ventricular diastolic function could not  be evaluated.   2. Right ventricular systolic function is normal. The right ventricular  size is normal. Tricuspid regurgitation signal is inadequate for assessing  PA pressure.   3. The mitral valve is normal in structure. No evidence of mitral valve  regurgitation. No evidence of mitral stenosis.   4. The aortic valve has been repaired/replaced. Aortic valve  regurgitation is not visualized. No aortic stenosis is present. There is a  21 mm Inspiris Resilia bioprosthetic valve present in  the aortic position.  Aortic valve area, by VTI measures 2.26  cm. Aortic valve mean gradient measures 6.0 mmHg. Aortic valve Vmax  measures 1.68 m/s.   5. The inferior vena cava is normal in size with greater than 50%  respiratory variability, suggesting right atrial pressure of 3 mmHg.   6. Trivial pericardial effusion is present. The pericardial effusion is  surrounding the apex.   07/05/23: inter-op TEE POST-OP IMPRESSIONS  _ Left Ventricle: The left ventricle is unchanged from pre-bypass.  _ Right Ventricle: The right ventricle appears unchanged from pre-bypass.  _ Aorta: The aorta appears unchanged from pre-bypass.  _ Left Atrial Appendage: The left atrial appendage appears unchanged from  pre-bypass.  _ Aortic Valve: A bioprosthetic valve was placed, leaflets are freely  mobile  Size; 21mm. The gradient recorded across the prosthetic valve is within  the  expected range. No perivalvular leak noted. Mean PG  _ Mitral Valve: The mitral valve appears unchanged from pre-bypass.  _ Tricuspid Valve: The tricuspid valve appears unchanged from pre-bypass.  _ Pulmonic Valve: The pulmonic valve appears unchanged from pre-bypass.  _ Interatrial Septum: The interatrial septum appears unchanged from  pre-bypass.  _ Pericardium: The pericardium appears unchanged from pre-bypass.  _ Comments: S/P AVR CABG. No new or worsening wall motion or valvular  abnormalities post procedure. 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 Seated appropriately without rock or leak. Gradients WNL.   PRE-OP FINDINGS   Left Ventricle: The left ventricle has normal systolic function, with an  ejection fraction of 60-65%. The cavity size was normal. Left ventricular  diastolic parameters are consistent with Grade I diastolic dysfunction  (impaired relaxation).    Right Ventricle: The right ventricle has normal systolic function. The  cavity was normal. There is no increase in right  ventricular wall  thickness. Right ventricular systolic pressure is normal.   Left Atrium: Left atrial size was normal in size. No left atrial/left  atrial appendage thrombus was detected. Left atrial appendage velocity is  normal at greater than 40 cm/s.   Right Atrium: Right atrial size was normal in size.   Interatrial Septum: No atrial level shunt detected by color flow Doppler.  The interatrial septum appears to be lipomatous. There is no evidence of a  patent foramen ovale.   Pericardium: There is no evidence of pericardial effusion. There is no  pleural effusion.   Mitral Valve: The mitral valve is normal in structure. Mitral valve  regurgitation is trivial by color flow  Doppler. There is no evidence of  mitral valve vegetation. There is No evidence of mitral stenosis.   Tricuspid Valve: The tricuspid valve was normal in structure. Tricuspid  valve regurgitation is trivial by color flow Doppler. No evidence of  tricuspid stenosis is present. There is no evidence of tricuspid valve  vegetation.   Aortic Valve: The aortic valve is tricuspid. Aortic valve regurgitation is  moderate to severe. The jet is eccentric anteriorly directed. There is  mild stenosis of the aortic valve, with a calculated valve area of 1.40  cm.  AI PHT , VC .7cm, diastolic reversal descending thoracic aorta, EROA  PISA .32cm2.   Pulmonic Valve: The pulmonic valve was normal in structure, with normal.  No evidence of pumonic stenosis.  Pulmonic valve regurgitation is mild by color flow Doppler.    Aorta: The aortic root and ascending aorta are normal in size and  structure.   Pulmonary Artery: Theone Murdoch catheter present on the right. The pulmonary  artery is of normal size.   Shunts: There is no evidence of an atrial septal defect.      05/03/23: LHC  Prox RCA to Mid RCA lesion is 50% stenosed.   Mid LM to Dist LM lesion is 30% stenosed.   Prox Cx to Dist Cx lesion is 50% stenosed.    Prox LAD to Mid LAD lesion is 50% stenosed.   1st Diag lesion is 60% stenosed.   There is severe (4+) aortic regurgitation.   1.  Moderate multivessel coronary artery disease with mild distal left main plaquing of 30%, moderate diffuse LAD stenosis of 50%, mild to moderate mid circumflex stenosis of 40 to 50%, and moderate mid RCA stenosis of 50%. 2.  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  Severe right subclavian tortuosity makes cardiac catheterization very difficult from right radial access.  If patient requires future cardiac catheterization, avoid right radial access.   Recommendations: Heart team review, cardiac CTA (TAVR protocol) to evaluate if there is enough aortic valve calcification to perform TAVR as aortic insufficiency appears to be the primary valve lesion.  The patient has a very wide pulse pressure and angiographically has severe AI.  Patient Profile     82 y.o. 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  48hours off nodal blocking agents with notable improvement in his conduction, though PVC burden has risen with some NSVTs as well. Very likely to see AFib for him again down the road as well   Now s/p PPM implant 07/17/23 Device check this morning with stable measurements Site is stable, no bleeding or ecchymosis CXR done, with stable lead position no PTX Wound care and arm restrictions were reviewed with the patient and placed in his AVS EP follow up is in place  OK to resume Eliquis on 07/21/23 Start toprol 100mg  daily  Dr. Elberta Fortis has seen the patient OK to discharge from EP perspective when ready medically otherwise    For questions or updates,  please contact [facility] Please consult [email or web address] for contact info under        Signed, Sheilah Pigeon, PA-C  07/18/2023, 9:20 AM    I have seen and examined this  patient with Francis Dowse.  Agree with above, note added to reflect my findings.  On exam, RRR, no murmurs, lungs clear. 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  Device functioning appropriately.  Sensing, threshold, impedance within normal limits as expected post implant.  Chest x-ray and interrogation without issue.  OK for discharge today with follow-up in device clinic.  Donis Kotowski M. Tifanie Gardiner MD 07/18/2023 1:49 PM

## 2023-07-18 NOTE — TOC Progression Note (Signed)
Transition of Care [facility]) - Progression Note    Patient Details  Name: BRYTEN SHURTS MRN: [id number] Date of Birth: 08-14-1941  Transition of Care [facility]) CM/SW Contact  Eduard Roux, [place] Phone Number: 07/18/2023, 3:51 PM  Clinical Narrative:     Insurance auth started by SNF- waiting on determination   Antony Blackbird, MSW, LCSW Clinical Social Worker    Expected Discharge Plan: Home w Home Health Services Barriers to Discharge: Barriers Resolved  Expected Discharge Plan and Services   Discharge Planning Services: CM Consult Post Acute Care Choice: IP Rehab Living arrangements for the past 2 months: Single Family Home Expected Discharge Date: 07/15/23                                     Social Determinants of Health (SDOH) Interventions SDOH Screenings   Food Insecurity: No Food Insecurity (07/08/2023)  Housing: Low Risk  (07/08/2023)  Transportation Needs: No Transportation Needs (07/08/2023)  Utilities: Not At Risk (07/08/2023)  Tobacco Use: Low Risk  (07/05/2023)    Readmission Risk Interventions     No data to display

## 2023-07-18 NOTE — Progress Notes (Signed)
Physical Therapy Treatment Patient Details Name: Bruce Nunez MRN: [id number] DOB: 02/26/1941 Today's Date: 07/18/2023   History of Present Illness Pt is a 82yo male who was admitted for dramatic decline in function due to DOE and fatigue. 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 PMH/PSH: aortic stenosis, portstate ca, GERD, s/p R inguinal hernia repair 9/2-15, HTN, HLD, asthma    PT Comments  Pt is progressing towards goals. Pt continues to require Min A for bed mobility, Mod A for sit to stand and Min A for short distance gait with RW. Pt continues to require multi modal cues to prevent pushing, pulling to protect integrity of surgical site. Due to pt current functional status, home set up and available assistance at home recommending skilled physical therapy services < 3 hours/day on discharge from acute care hospital setting in order to address strength, balance, safety and activity tolerance to decrease risk for falls, injury, immobility and re-hospitalization. Pt tolerated treatment session well.     If plan is discharge home, recommend the following: Supervision due to cognitive status;Help with stairs or ramp for entrance;Direct supervision/assist for medications management;Assist for transportation;A little help with walking and/or transfers   Can travel by private vehicle     No  Equipment Recommendations  Other (comment) (defer to post acute)       Precautions / Restrictions Precautions Precautions: Fall;Sternal Precaution Comments: Pt can remember no pushing/no pulling but does not follow precautions with functional mobility or ADL without significant cues Restrictions Weight Bearing Restrictions: Yes RUE Weight Bearing: Non weight bearing LUE Weight Bearing: Non weight bearing Other Position/Activity Restrictions: sternal precautions     Mobility  Bed Mobility Overal bed mobility: Needs Assistance Bed Mobility: Sidelying to  Sit, Sit to Sidelying, Rolling Rolling: Min assist Sidelying to sit: Min assist     Sit to sidelying: Min assist General bed mobility comments: cues for sequencing and Min physical assist for trunk wit supine to sitting and LE for sitting to supine    Transfers Overall transfer level: Needs assistance Equipment used: Rolling walker (2 wheels) Transfers: Sit to/from Stand Sit to Stand: Mod assist           General transfer comment: Mod A for physical assist for momentum to get to standing from sitting EOB.    Ambulation/Gait Ambulation/Gait assistance: Min assist Gait Distance (Feet): 80 Feet Assistive device: Rolling walker (2 wheels) Gait Pattern/deviations: Step-through pattern, Decreased stride length, Shuffle Gait velocity: decreased Gait velocity interpretation: <1.8 ft/sec, indicate of risk for recurrent falls   General Gait Details: Intermittent shuffling this session though improved from last session. Pt requires occasional verbal cues for sequencing with AD. HR no greater than 107 bpm      Balance Overall balance assessment: Needs assistance Sitting-balance support: Feet supported, No upper extremity supported Sitting balance-Leahy Scale: Fair Sitting balance - Comments: Supervision for safety   Standing balance support: Bilateral upper extremity supported Standing balance-Leahy Scale: Fair Standing balance comment: intermittently reliant on external support to prevent falls.            Cognition Arousal: Alert Behavior During Therapy: WFL for tasks assessed/performed, Impulsive Overall Cognitive Status: Impaired/Different from baseline Area of Impairment: Following commands, Safety/judgement, Awareness, Memory, Problem solving     Orientation Level: Disoriented to, Situation, Time   Memory: Decreased recall of precautions, Decreased short-term memory Following Commands: Follows one step commands with increased time Safety/Judgement: Decreased  awareness of safety, Decreased awareness of deficits   Problem Solving: Difficulty sequencing, Requires verbal cues, Requires tactile cues General Comments: Pt continues to have alot of difficulty following precautions and when asked about precautions can name them by stating hug a pillow when I stand and when I roll then does not do this when trying to move requiring heavy cueing.           General Comments General comments (skin integrity, edema, etc.): HR no greater than 107 bpm during functional mobility. Pt was less fatigued after gait. Pt states that he is sore all over from his surgery yesterday; pt was oriented to situation and time line.      Pertinent Vitals/Pain Pain Assessment Pain Assessment: No/denies pain     PT Goals (current goals can now be found in the care plan section) Acute Rehab PT Goals Patient Stated Goal: didn't state PT Goal Formulation: Patient unable to participate in goal setting Time For Goal Achievement: 07/23/23 Potential to Achieve Goals: Good Progress towards PT goals: Progressing toward goals    Frequency    Min 1X/week      PT Plan  Continue with current POC       AM-PAC PT "6 Clicks" Mobility   Outcome Measure  Help needed turning from your back to your side while in a flat bed without using bedrails?: A Little Help needed moving from lying on your back to sitting on the side of a flat bed without using bedrails?: A Little Help needed moving to and from a bed to a chair (including a wheelchair)?: A Little Help needed standing up from a chair using your arms (e.g., wheelchair or bedside chair)?: A Little Help needed to walk in hospital room?: A Little Help needed climbing 3-5 steps with a railing? : A Lot 6 Click Score: 17    End of Session Equipment Utilized During Treatment: Gait belt Activity Tolerance: Patient tolerated treatment well Patient left: in bed;with call bell/phone within reach;with bed alarm set;with  family/visitor present Nurse Communication: Mobility status PT Visit Diagnosis: Unsteadiness on feet (R26.81);Muscle weakness (generalized) (M62.81);Difficulty in walking, not elsewhere classified (R26.2)     Time: 9604-5409 PT Time Calculation (min) (ACUTE ONLY): 14 min  Charges:    $Therapeutic Activity: 8-22 mins PT General Charges $$ ACUTE PT VISIT: 1 Visit                    Harrel Carina, DPT, CLT  Acute Rehabilitation Services Office: [phone] (Secure chat preferred)    Claudia Desanctis 07/18/2023, 1:40 PM

## 2023-07-18 NOTE — Progress Notes (Addendum)
      [address]       [place] [address]             [phone]      1 Day Post-Op Procedure(s) (LRB): PACEMAKER IMPLANT (N/A) Subjective: The patient states he was feeling much better prior to the pacemaker but now he is very sore. Overall still feels okay though.  Objective: Vital signs in last 24 hours: Temp:  [97.6 F (36.4 C)-98 F (36.7 C)] 97.9 F (36.6 C) (11/21 0345) Pulse Rate:  [99-109] 105 (11/21 0345) Cardiac Rhythm: Sinus tachycardia (11/20 1903) Resp:  [16-26] 23 (11/21 0345) BP: (113-167)/(57-102) 133/75 (11/21 0345) SpO2:  [93 %-100 %] 94 % (11/21 0345) Weight:  [65.1 kg] 65.1 kg (11/21 0403)  Hemodynamic parameters for last 24 hours:    Intake/Output from previous day: 11/20 0701 - 11/21 0700 In: 683 [P.O.:480; I.V.:3; IV Piggyback:200] Out: 600 [Urine:600] Intake/Output this shift: No intake/output data recorded.  General appearance: alert, cooperative, and no distress Neurologic: Alert and oriented, grossly intact this AM, confusion improving, no acute abnormalities Heart: sinus tachycardia, no murmur Lungs: clear to auscultation bilaterally Abdomen: soft, non-tender; bowel sounds normal; no masses,  no organomegaly Extremities: extremities normal, atraumatic, no cyanosis or edema Wound: Clean and dry without sign of infection  Lab Results: No results for input(s): "WBC", "HGB", "HCT", "PLT" in the last 72 hours. BMET:  Recent Labs    07/17/23 0433  NA 138  K 3.5  CL 104  CO2 26  GLUCOSE 120*  BUN 10  CREATININE 0.92  CALCIUM 8.6*    PT/INR: No results for input(s): "LABPROT", "INR" in the last 72 hours. ABG    Component Value Date/Time   PHART 7.363 07/05/2023 2208   HCO3 21.2 07/05/2023 2208   TCO2 22 07/05/2023 2208   ACIDBASEDEF 4.0 (H) 07/05/2023 2208   O2SAT 90 07/05/2023 2208   CBG (last 3)  No results for input(s): "GLUCAP" in the last 72 hours.  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 Intermittent confusion at times. PT/OT/SLP, to be [facility]. On ASA and and Eliquis due to postop afib.   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 Echo with LVEF 60-65%, aortic valve working appropriately. Pacemaker placed per EP yesterday. Sinus tachycardia this AM, HR 105. SBP 133. Restarted on Toprol XL 100mg  per EP. Will restart Eliquis.   Pulm: Saturating well on RA. Continue nebs, mucinex PRN, flutter valve and IS. Continue ambulation.    GI: Patient has not had bowel movement since diarrhea 11/14 when stool softeners were discontinued. On daily stool softeners, will add lactulose PRN today. Pt has not been eating much, does admit to eating some soup and grilled cheese, states he does not like the ensure shakes. Encouraged increasing diet as tolerated.    Endo: DM on Glimepiride at home, will restart at discharge since pt had not had a great appetite. Hypothyroid, on levothyroxine.    Renal: Cr 0.92. Below preop weight, Lasix discontinued. Continue Flomax   Deconditioning: Continue PT/OT/SLP, SNF at discharge   Dispo: Needs SNF reauthorization, hopefully can d/c to SNF today or tomorrow once a bed is available.     LOS: 13 days    Jenny Reichmann, PA-C 07/18/2023

## 2023-07-19 MED ORDER — APIXABAN 5 MG PO TABS: 5.0000 mg | ORAL_TABLET | Freq: Two times a day (BID) | ORAL | Status: DC

## 2023-07-19 MED ORDER — ACETAMINOPHEN 325 MG PO TABS: 325.0000 mg | ORAL_TABLET | Freq: Four times a day (QID) | ORAL | Status: AC | PRN

## 2023-07-19 MED ORDER — LACTULOSE 10 GM/15ML PO SOLN
20.0000 g | Freq: Once | ORAL | Status: DC
  Filled 2023-07-19: qty 30

## 2023-07-19 MED ORDER — METOPROLOL SUCCINATE ER 100 MG PO TB24: 100.0000 mg | ORAL_TABLET | Freq: Every day | ORAL | Status: DC

## 2023-07-19 NOTE — Progress Notes (Signed)
OT Cancellation Note  Patient Details Name: LOWELL ORDOYNE MRN: [id number] DOB: [birth year]   Cancelled Treatment:    Reason Eval/Treat Not Completed: Other (comment) Eating breakfast but agreeable for therapy follow up  Lorre Munroe 07/19/2023, 10:26 AM

## 2023-07-19 NOTE — Progress Notes (Signed)
Speech Language Pathology Treatment: Cognitive-Linquistic  Patient Details Name: Bruce Nunez MRN: [id number] DOB: 1941-07-19 Today's Date: 07/19/2023 Time: 7253-6644 SLP Time Calculation (min) (ACUTE ONLY): 14 min  Assessment / Plan / Recommendation Clinical Impression  Patient seen for f/u cognitive-linguistic treatment. Remains mildly dysarthric (which spouse had reported was baseline) but is now 100% intelligible, speaking with purpose and overarticulating when necessary independently. He was able to verbalize events of the past week, particularly pacemaker placement and plans for SNF after d/c however short term memory for specifics of timeline of events remains impaired. Basic problem solving intact. At this time, with fluctuations in cognitive abilities but generally intact abilities to complete basic tasks and with intact safety awareness, will d/c from acute care SLP services but recommend f/u at SNF. Possible plans to d/c today per MD notes.    HPI HPI: Bruce Nunez is an 82 y.o. 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 Neurology was consulted 11/13 due to increased confusion.      MRI brain was ordered that shows punctate foci of acute infarction in the left temporal occipital  junction and right parietal subcortical white matter, an acute cortical infarct along the right superior frontal gyrus and additional foci of DWI hyperintensity along the left pre and  postcentral gyri, likely reflecting subacute infarcts.      SLP Plan  All goals met      Recommendations for follow up therapy are one component of a multi-disciplinary discharge planning process, led by the attending physician.  Recommendations may be updated based on patient status, additional functional criteria and insurance  authorization.    Recommendations                                 All goals met    Ferdinand Lango MA, CCC-SLP  Timea Breed Meryl  07/19/2023, 9:50 AM

## 2023-07-19 NOTE — Discharge Summary (Addendum)
[address]       [place] [address]             [phone]    Physician Discharge Summary  Patient ID: HALLET OBENOUR MRN: [id number] DOB/AGE: 83-09-1940 83 y.o.  Admit date: 07/05/2023 Discharge date: 07/20/2023  Admission Diagnoses:  Patient Active Problem List   Diagnosis Date Noted   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   Endotracheally intubated 07/05/2023   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   Stroke (HCC) 03/28/2022   PONV (postoperative nausea and vomiting)    Inguinal hernia    Hypertension    Hyperlipidemia    Hx of transfusion    Hx of peptic ulcer    Hx of nonmelanoma skin cancer    Hx of colon cancer, stage I    History of kidney stones    Hemorrhoids    Heart murmur    GERD (gastroesophageal reflux disease)    Frequency of urination    Diabetes mellitus without complication (HCC)    Complication of anesthesia    Asthma    Anemia    Aortic stenosis 04/29/2019   Aortic regurgitation 03/12/2017   Essential hypertension 03/12/2017   Right inguinal hernia 05/08/2016   S/P right inguinal hernia repair Sept 2015 05/17/2014   Prostate cancer (HCC) 08/15/2012     Discharge Diagnoses:  Patient Active Problem List   Diagnosis Date Noted   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   Endotracheally intubated 07/05/2023   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   Stroke (HCC) 03/28/2022   PONV (postoperative nausea and vomiting)    Inguinal hernia    Hypertension    Hyperlipidemia    Hx of transfusion    Hx of peptic ulcer    Hx of nonmelanoma skin cancer    Hx of colon cancer, stage I    History of kidney stones    Hemorrhoids    Heart murmur    GERD (gastroesophageal reflux disease)    Frequency of urination    Diabetes mellitus without complication (HCC)    Complication of  anesthesia    Asthma    Anemia    Aortic stenosis 04/29/2019   Aortic regurgitation 03/12/2017   Essential hypertension 03/12/2017   Right inguinal hernia 05/08/2016   S/P right inguinal hernia repair Sept 2015 05/17/2014   Prostate cancer (HCC) 08/15/2012     Discharged Condition: Stable  History of Present Illness:     Pt is a very pleasant 82 yo male who was told had a leaky aortic valve over 5 years ago and has been followed by Dr Dulce Sellar. Pt has experienced a dramatic decline in functional ability the past 6 months. He was doing very active chores around large home acreage but has declined significantly to where he can't do very much at all. He becomes winded and also describes some lower chest discomfort. All stop when he rests. Pt underwent recent ehco with normal LV function but with moderate to severe AI and mild AS. Pt underwent cath with moderate CAD and LV gram with severe AR. Pt also reports having seen neurology due to concerns of strokes with an inability to control his lower right leg and was negative on CT scans but with evidence of small CVA on MRI. Pt was  started on plavix. Pt is very anxious to proceed with AVR and on consideration for TAVR was felt not a candidate due to the lack of calcium to hold valve.  Dr. Leafy Ro reviewed the patient's diagnostic studies and determined he would benefit from surgical intervention. He reviewed the treatment options as well as the risks and benefits of surgery. Mr. Killey was agreeable to proceed with surgery.  Hospital Course: Mr. Filyaw presented to [facility] and was brought to the operating room on 07/05/23. He underwent CABG x 1 utilizing LIMA to LAD and AVR utilizing a 21mm Inspiris Aortic Valve. He tolerated the procedure well and was transferred to the SICU in stable condition.  Vital signs and hemodynamics remained stable.  He was weaned from the mechanical ventilator and extubated by 9 PM on the day of surgery.   By the  following morning, he had developed significant agitation and delirium.  He was treated with both Precedex and haloperidol.  Symptoms improved so that he was more cooperative by midmorning on postop day 2. Vital signs and hemodynamics are stable.  The epicardial pacing wires were removed on postop day 2.  Diuresis was begun with IV Lasix. Chest tubes were removed without complication. He had expected postoperative thrombocytopenia that improved with time. He was felt stable for transfer to the progressive unit. He was restarted on Plavix for his recent stroke. The patient continued to have intermittent confusion requiring seroquel and Haldol PRN as well as a sitter, narcotics were held. He was hypertensive and tachycardic, Lopressor was titrated to 50mg  TID as discussed with Dr. Leafy Ro. PT/OT evaluations recommended SNF at discharge. He remained confused, as discussed with neurology MRI brain was ordered and showed punctate foci of acute infarction in the left temporal occipital junction and right parietal subcortical white matter as well as an acute cortical infarct along the right superior frontal gyrus and additional foci of DWI hypersensitivity along the left pre and postcentral gyri likely reflecting subacute infarcts. No acute hemorrhage was noted. Neurology was consulted and recommended MRA which was unremarkable. Neurology continued to follow closely. SLP was consulted for evaluation and treatment. Lopressor was titrated for hypertension. He developed postoperative rate controlled atrial fibrillation. He is allergic to iodine contrast dye so he was started on Diltiazem instead of Amiodarone. Plavix was transitioned to Eliquis for further stroke prevention.  The patient's delirium improved.  He remained in NSR.  He continued working with PT.  His surgical incisions are free from evidence of infection. 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 They recommended discontinuing all nodal agents including Metoprolol and Diltiazem, his conduction improved but EP recommended pacemaker placement which was done on 11/20 without complication. Per EP recommendations he was restarted on Toprol XL 100mg  daily and Eliquis on 11/24. His ambulation was progressing and his incisions were healing well without sign of infection. He was felt stable for discharge to SNF on 11/21 but was unable to get SNF authorization and a bed until 11/23.   Consults:  EP  Significant Diagnostic Studies: ECHOCARDIOGRAM REPORT       Patient Name:   BHARATH PEEK Date of Exam: 07/11/2022  Medical Rec #:  [id number]     Height:       67.0 in  Accession #:    [id number]    Weight:       151.2 lb  Date of Birth:  Mar 30, 1941      BSA:          1.796 m  Patient Age:    81 years      BP:           146/54 mmHg  Patient Gender: M             HR:           63 bpm.  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 Risk Factors:Hypertension and  Dyslipidemia.    Sonographer:   Louie Boston RDCS  Referring Phys: [id number] BRIAN J MUNLEY   IMPRESSIONS     1. GLS -12.9. Left ventricular ejection fraction, by estimation, is 60 to  65%. The left ventricle has normal function. The left ventricle has no  regional wall motion abnormalities. Left ventricular diastolic parameters  are consistent with Grade I  diastolic dysfunction (impaired relaxation).   2. Right ventricular systolic function is normal. The right ventricular  size is normal. There is normal pulmonary artery systolic pressure.   3. The mitral valve is normal in structure. No  evidence of mitral valve  regurgitation. No evidence of mitral stenosis.   4. EF still 60-65%, normal LV size. The aortic valve is calcified. There  is moderate calcification of the aortic valve. There is mild thickening of  the aortic valve. Aortic valve regurgitation is moderate to severe. Mild  to moderate aortic valve  stenosis. Aortic valve mean gradient measures 14.0 mmHg.   5. The inferior vena cava is normal in size with greater than 50%  respiratory variability, suggesting right atrial pressure of 3 mmHg.   FINDINGS   Left Ventricle: GLS -12.9. Left ventricular ejection fraction, by  estimation, is 60 to 65%. The left ventricle has normal function. The left  ventricle has no regional wall motion abnormalities. The left ventricular  internal cavity size was normal in  size. There is no left ventricular hypertrophy. Left ventricular diastolic  parameters are consistent with Grade I diastolic dysfunction (impaired  relaxation).   Right Ventricle: The right ventricular size is normal. No increase in  right ventricular wall thickness. Right ventricular systolic function is  normal. There is normal pulmonary artery systolic pressure. The tricuspid  regurgitant velocity is 2.55 m/s, and   with an assumed right atrial pressure of 3 mmHg, the estimated right  ventricular systolic pressure is 29.0 mmHg.   Left Atrium: Left atrial size was normal in size.   Right Atrium: Right atrial size was normal in size.   Pericardium: There is no evidence of pericardial effusion.   Mitral Valve: The mitral valve is normal in structure. No evidence of  mitral valve regurgitation. No evidence of mitral valve stenosis.   Tricuspid Valve: The tricuspid valve is normal in structure. Tricuspid  valve regurgitation is mild . No evidence of tricuspid stenosis.   Aortic Valve: EF still 60-65%, normal LV size. The aortic valve is  calcified. There is moderate calcification of the aortic valve. There  is  mild  thickening of the aortic valve. Aortic valve regurgitation is  moderate to severe. Aortic regurgitation PHT  measures 314 msec. Mild to moderate aortic stenosis is present. Aortic  valve mean gradient measures 14.0 mmHg. Aortic valve peak gradient  measures 26.1 mmHg. Aortic valve area, by VTI measures 1.11 cm.   Pulmonic Valve: The pulmonic valve was normal in structure. Pulmonic valve  regurgitation is not visualized. No evidence of pulmonic stenosis.   Aorta: The aortic root is normal in size and structure.   Venous: The inferior vena cava is normal in size with greater than 50%  respiratory variability, suggesting right atrial pressure of 3 mmHg.   IAS/Shunts: No atrial level shunt detected by color flow Doppler.     LEFT VENTRICLE  PLAX 2D  LVIDd:         4.30 cm   Diastology  LVIDs:         2.50 cm   LV e' medial:    4.03 cm/s  LV PW:         1.00 cm   LV E/e' medial:  19.0  LV IVS:        1.40 cm   LV e' lateral:   7.29 cm/s  LVOT diam:     1.80 cm   LV E/e' lateral: 10.5  LV SV:         58  LV SV Index:   32  LVOT Area:     2.54 cm     RIGHT VENTRICLE             IVC  RV S prime:     10.30 cm/s  IVC diam: 1.20 cm  TAPSE (M-mode): 2.2 cm   LEFT ATRIUM             Index        RIGHT ATRIUM          Index  LA diam:        3.50 cm 1.95 cm/m   RA Area:     9.45 cm  LA Vol (A2C):   48.2 ml 26.84 ml/m  RA Volume:   17.10 ml 9.52 ml/m  LA Vol (A4C):   54.0 ml 30.07 ml/m  LA Biplane Vol: 51.6 ml 28.74 ml/m   AORTIC VALVE  AV Area (Vmax):    0.99 cm  AV Area (Vmean):   1.01 cm  AV Area (VTI):     1.11 cm  AV Vmax:           255.50 cm/s  AV Vmean:          168.500 cm/s  AV VTI:            0.525 m  AV Peak Grad:      26.1 mmHg  AV Mean Grad:      14.0 mmHg  LVOT Vmax:         99.80 cm/s  LVOT Vmean:        66.800 cm/s  LVOT VTI:          0.228 m  LVOT/AV VTI ratio: 0.43  AI PHT:            314 msec    AORTA  Ao Root diam: 2.30 cm  Ao Asc diam:   3.00 cm   MITRAL VALVE               TRICUSPID VALVE  MV Area (PHT): 3.74 cm    TR Peak grad:   26.0  mmHg  MV Decel Time: 203 msec    TR Vmax:        255.00 cm/s  MV E velocity: 76.70 cm/s  MV A velocity: 83.60 cm/s  SHUNTS  MV E/A ratio:  0.92        Systemic VTI:  0.23 m                             Systemic Diam: 1.80 cm   Gypsy Balsam MD  Electronically signed by Gypsy Balsam MD  Signature Date/Time: 07/11/2022/12:25:32 PM        Final     ECHOCARDIOGRAM LIMITED REPORT       Patient Name:   DEAKYN STOLZENBURG Date of Exam: 07/16/2023  Medical Rec #:  [id number]     Height:       67.0 in  Accession #:    [id number]    Weight:       151.5 lb  Date of Birth:  August 22, 1941      BSA:          1.797 m  Patient Age:    82 years      BP:           125/67 mmHg  Patient Gender: M             HR:           93 bpm.  Exam Location:  Inpatient   Procedure: Limited Echo, Color Doppler and Cardiac Doppler   Indications:     s/p AVR    History:         Patient has prior history of Echocardiogram examinations,  most                  recent 07/06/2023. Risk Factors:Hypertension, Diabetes and                   Dyslipidemia. 07/06/23 CABG and AVR with 21mm Inspiris  Resilia                  Bioprosthetic.                   Aortic Valve: 21 mm Inspiris Resilia bioprosthetic valve  is                  present in the aortic position.    Sonographer:     Irving Burton Senior RDCS  Referring Phys:  [id number] Sheilah Pigeon  Diagnosing Phys: Armanda Magic MD   IMPRESSIONS     1. Left ventricular ejection fraction, by estimation, is 60 to 65%. The  left ventricle has normal function. The left ventricle has no regional  wall motion abnormalities. Left ventricular diastolic function could not  be evaluated.   2. Right ventricular systolic function is normal. The right ventricular  size is normal. Tricuspid regurgitation signal is inadequate for assessing  PA pressure.   3. The mitral valve is  normal in structure. No evidence of mitral valve  regurgitation. No evidence of mitral stenosis.   4. The aortic valve has been repaired/replaced. Aortic valve  regurgitation is not visualized. No aortic stenosis is present. There is a  21 mm Inspiris Resilia bioprosthetic valve present in the aortic position.  Aortic valve area, by VTI measures 2.26  cm. Aortic valve mean gradient measures 6.0 mmHg. Aortic valve Vmax  measures 1.68 m/s.   5. The inferior vena cava is  normal in size with greater than 50%  respiratory variability, suggesting right atrial pressure of 3 mmHg.   6. Trivial pericardial effusion is present. The pericardial effusion is  surrounding the apex.   FINDINGS   Left Ventricle: Left ventricular ejection fraction, by estimation, is 60  to 65%. The left ventricle has normal function. The left ventricle has no  regional wall motion abnormalities. The left ventricular internal cavity  size was normal in size. There is   no left ventricular hypertrophy. Left ventricular diastolic function  could not be evaluated.   Right Ventricle: The right ventricular size is normal. No increase in  right ventricular wall thickness. Right ventricular systolic function is  normal. Tricuspid regurgitation signal is inadequate for assessing PA  pressure.   Left Atrium: Left atrial size was normal in size.   Right Atrium: Right atrial size was normal in size. Prominent Chiari  network.   Pericardium: Trivial pericardial effusion is present. The pericardial  effusion is surrounding the apex.   Mitral Valve: The mitral valve is normal in structure. Mild to moderate  mitral annular calcification. No evidence of mitral valve stenosis.   Tricuspid Valve: The tricuspid valve is normal in structure. Tricuspid  valve regurgitation is not demonstrated. No evidence of tricuspid  stenosis.   Aortic Valve: The aortic valve has been repaired/replaced. Aortic valve  regurgitation is not  visualized. No aortic stenosis is present. Aortic  valve mean gradient measures 6.0 mmHg. Aortic valve peak gradient measures  11.3 mmHg. Aortic valve area, by VTI   measures 2.26 cm. There is a 21 mm Inspiris Resilia bioprosthetic valve  present in the aortic position.   Pulmonic Valve: The pulmonic valve was normal in structure. Pulmonic valve  regurgitation is mild. No evidence of pulmonic stenosis.   Aorta: The aortic root is normal in size and structure.   Venous: The inferior vena cava is normal in size with greater than 50%  respiratory variability, suggesting right atrial pressure of 3 mmHg.   IAS/Shunts: No atrial level shunt detected by color flow Doppler.   Additional Comments: Spectral Doppler performed. Color Doppler performed.    LEFT VENTRICLE  PLAX 2D  LVOT diam:     2.00 cm  LV SV:         56  LV SV Index:   31  LVOT Area:     3.14 cm     RIGHT VENTRICLE  RV S prime:     8.81 cm/s  TAPSE (M-mode): 1.4 cm   AORTIC VALVE  AV Area (Vmax):    2.36 cm  AV Area (Vmean):   2.30 cm  AV Area (VTI):     2.26 cm  AV Vmax:           168.00 cm/s  AV Vmean:          111.000 cm/s  AV VTI:            0.249 m  AV Peak Grad:      11.3 mmHg  AV Mean Grad:      6.0 mmHg  LVOT Vmax:         126.00 cm/s  LVOT Vmean:        81.200 cm/s  LVOT VTI:          0.179 m  LVOT/AV VTI ratio: 0.72     SHUNTS  Systemic VTI:  0.18 m  Systemic Diam: 2.00 cm   Armanda Magic MD  Electronically signed by Armanda Magic  MD  Signature Date/Time: 07/16/2023/1:37:35 PM        Final (Updated)     LEFT HEART CATH AND CORONARY ANGIOGRAPHY     Prox RCA to Mid RCA lesion is 50% stenosed.   Mid LM to Dist LM lesion is 30% stenosed.   Prox Cx to Dist Cx lesion is 50% stenosed.   Prox LAD to Mid LAD lesion is 50% stenosed.   1st Diag lesion is 60% stenosed.   There is severe (4+) aortic regurgitation.  Treatments: surgery: 07/05/2023 WHALEN OVERHOLT [id number]   Surgeon:  Ashley Akin, MD   First Assistant: Aloha [facility]                               An experienced assistant was required given the complexity of this surgery and the standard of surgical care. The assistant was needed for exposure, dissection, suctioning, retraction of delicate tissues and sutures, instrument exchange and for overall help during this procedure.       Preoperative Diagnosis:  Severe Aortic Regurgitation                                               Coronary artery disease   Postoperative Diagnosis:  Same     Procedure: Aortic valve replacement with a 21mm Inspiris Pericardial valve Coronary artery bypass x 1 with the LIMA to the LAD   Discharge Exam: Blood pressure 110/84, pulse 96, temperature 97.6 F (36.4 C), temperature source Oral, resp. rate 18, height 5\' 7"  (1.702 m), weight 63.5 kg, SpO2 100%. Cardiovascular: RRR Pulmonary: Clear to auscultation bilaterally Abdomen: Soft, non tender, bowel sounds present. Extremities: No lower extremity edema. Wounds: Clean and dry.  No erythema or signs of infection.     Discharge Medications:  The patient has been discharged on:   1.Beta Blocker:  Yes [ X  ]                              No   [   ]                              If No, reason:  2.Ace Inhibitor/ARB: Yes [   ]                                     No  [ X   ]                                     If No, reason: Labile BP, avoid low BP per neurology recommendation, AKI  3.Statin:   Yes [ X  ]                  No  [   ]                  If No, reason:  4.Ecasa:  Yes  [  X ]  No   [   ]                  If No, reason:  Patient had ACS upon admission: No  Plavix/P2Y12 inhibitor: Yes [   ]                                      No  [ X  ]     Discharge Instructions     Amb Referral to Cardiac Rehabilitation   Complete by: As directed    Diagnosis:  CABG Valve Replacement     Valve: Aortic   CABG X ___: 1   After initial  evaluation and assessments completed: Virtual Based Care may be provided alone or in conjunction with Phase 2 Cardiac Rehab based on patient barriers.: Yes   Intensive Cardiac Rehabilitation (ICR) [facility] location only OR Traditional Cardiac Rehabilitation (TCR) *If criteria for ICR are not met will enroll in TCR [facility] only): Yes      Allergies as of 07/20/2023       Reactions   Penicillins Anaphylaxis, Shortness Of Breath, Itching, Swelling   Throat swells up but no intubation per wife   Iodinated Contrast Media Hives, Rash   Aspirin Other (See Comments)   GI bleeding-avoids   Metformin And Related Diarrhea   Monodox [doxycycline Hyclate] Nausea Only   "made me feel sick"   Sulfa Antibiotics Itching, Swelling   Tramadol Nausea And Vomiting        Medication List     STOP taking these medications    bisoprolol-hydrochlorothiazide 10-6.25 MG tablet Commonly known as: ZIAC   clopidogrel 75 MG tablet Commonly known as: Plavix   furosemide 20 MG tablet Commonly known as: LASIX   isosorbide mononitrate 30 MG 24 hr tablet Commonly known as: IMDUR   nitroGLYCERIN 0.4 MG SL tablet Commonly known as: NITROSTAT       TAKE these medications    acetaminophen 325 MG tablet Commonly known as: TYLENOL Take 1-2 tablets (325-650 mg total) by mouth every 6 (six) hours as needed for mild pain (pain score 1-3).   albuterol 108 (90 Base) MCG/ACT inhaler Commonly known as: VENTOLIN HFA Inhale 2 puffs into the lungs every 6 (six) hours as needed for shortness of breath.   apixaban 5 MG Tabs tablet Commonly known as: ELIQUIS Take 1 tablet (5 mg total) by mouth 2 (two) times daily. Start taking on: July 21, 2023   aspirin 81 MG chewable tablet Chew 1 tablet (81 mg total) by mouth daily.   budesonide-formoterol 160-4.5 MCG/ACT inhaler Commonly known as: SYMBICORT Inhale 2 puffs into the lungs 2 (two) times daily.   cyanocobalamin 1000 MCG/ML injection Commonly known as:  VITAMIN B12 Inject 1 mL into the skin every 30 (thirty) days.   dicyclomine 10 MG capsule Commonly known as: BENTYL Take 10 mg by mouth 2 (two) times daily as needed for cramping (abdominal pain/diarrhea.).   feeding supplement Liqd Take 237 mLs by mouth 3 (three) times daily with meals.   fexofenadine 180 MG tablet Commonly known as: ALLEGRA Take 180 mg by mouth daily.   fluticasone 50 MCG/ACT nasal spray Commonly known as: FLONASE Place 1 spray into both nostrils daily.   glimepiride 2 MG tablet Commonly known as: AMARYL Take 2 mg by mouth in the morning.   levothyroxine 75 MCG tablet Commonly known as: SYNTHROID Take 75 mcg  by mouth daily before breakfast.   metoprolol succinate 100 MG 24 hr tablet Commonly known as: TOPROL-XL Take 1 tablet (100 mg total) by mouth daily. Take with or immediately following a meal.   multivitamin with minerals Tabs tablet Take 1 tablet by mouth daily.   omeprazole 20 MG capsule Commonly known as: PRILOSEC Take 20 mg by mouth in the morning.   rosuvastatin 20 MG tablet Commonly known as: CRESTOR Take 1 tablet (20 mg total) by mouth daily.   solifenacin 10 MG tablet Commonly known as: VESICARE Take 10 mg by mouth in the morning.   tamsulosin 0.4 MG Caps capsule Commonly known as: FLOMAX Take 0.4 mg by mouth 2 (two) times daily.        Contact information for follow-up providers     [facility] [place] Follow up on 07/31/2023.   Specialty: Cardiothoracic Surgery Why: Follow up appointment is at 3:00PM Contact information: [address] [place], [address] [place] [address] [phone]        [facility] Follow up on 07/31/2023.   Why: To get chest xray at 2:00PM, prior to your appointment Contact information: [address] [place] [address]        Flossie Dibble, NP Follow up on 07/29/2023.   Specialty: Cardiology Why: Cardiology  appointment is at 2:45PM Contact information: [address] [place] [address] [phone]         Micki Riley, MD. Go on 10/16/2023.   Specialties: Neurology, Radiology Why: stroke clinic, as scheduled at 1:00PM Contact information: [address] [place] [address] [phone]         [facility] Follow up on 08/16/2023.   Specialty: Cardiology Why: Echocardiogram is at 7:30AM Contact information: [address] [place] [address] [phone]        [facility] Go on 08/16/2023.   Why: Appointment time is at 7:30 am Contact information: [facility] [address] [place], [place] [address]             Contact information for after-discharge care     Destination     [facility] Preferred SNF .   Service: Skilled Nursing Contact information: [address] [place] [address] [phone]                     Signed:  Elenore Rota  07/20/2023, 12:06 PM

## 2023-07-19 NOTE — Progress Notes (Signed)
Occupational Therapy Treatment Patient Details Name: Bruce Nunez MRN: [id number] DOB: Mar 17, 1941 Today's Date: 07/19/2023   History of present illness Pt is a 82yo male who was admitted for dramatic decline in function due to DOE and fatigue. 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 PMH/PSH: aortic stenosis, portstate ca, GERD, s/p R inguinal hernia repair 9/2-15, HTN, HLD, asthma   OT comments  Pt making steady progress towards OT goals. Pt able to recall precautions fairly well though still has difficulty implementing during ADLs/functional tasks. Pt requires Min-Mod A to stand but once up, able to mobilize with CGA using RW. Pt requires Min-Mod A for LB ADLs d/t current deficits including dressing and toileting mgmt during session today. Pt eager to improve with continued rehab at [facility].      If plan is discharge home, recommend the following:  A little help with walking and/or transfers;A lot of help with bathing/dressing/bathroom;Assistance with cooking/housework;Direct supervision/assist for medications management;Direct supervision/assist for financial management;Assist for transportation;Supervision due to cognitive status   Equipment Recommendations  BSC/3in1;Other (comment) (RW)    Recommendations for Other Services      Precautions / Restrictions Precautions Precautions: Fall;Sternal;ICD/Pacemaker Precaution Booklet Issued: Yes (comment) Precaution Comments: Pt can remember no pushing/no pulling but does not follow precautions with functional mobility or ADL without significant cues Restrictions Weight Bearing Restrictions: Yes RUE Weight Bearing: Non weight bearing LUE Weight Bearing: Non weight bearing Other Position/Activity Restrictions: sternal precautions       Mobility Bed Mobility Overal bed mobility: Needs Assistance Bed Mobility: Supine to Sit     Supine to sit: Supervision, HOB elevated           Transfers Overall transfer level: Needs assistance Equipment used: Rolling walker (2 wheels) Transfers: Sit to/from Stand Sit to Stand: Min assist, Mod assist           General transfer comment: Min A from bedside, Mod A from reg toilet height. Able to cross arms and rock but when on "3", pt quickly reaches to pull on RW and difficult to redirect from this     Balance Overall balance assessment: Needs assistance Sitting-balance support: Feet supported, No upper extremity supported Sitting balance-Leahy Scale: Fair     Standing balance support: Bilateral upper extremity supported Standing balance-Leahy Scale: Fair                             ADL either performed or assessed with clinical judgement   ADL Overall ADL's : Needs assistance/impaired     Grooming: Contact guard assist;Standing;Wash/dry hands           Upper Body Dressing : Set up;Supervision/safety;Sitting   Lower Body Dressing: Minimal assistance;Sit to/from stand;Sitting/lateral leans Lower Body Dressing Details (indicate cue type and reason): assist for wrap around brief though pt attempting to assist as well Toilet Transfer: Contact guard assist;Ambulation;Moderate assistance;Rolling walker (2 wheels) Toilet Transfer Details (indicate cue type and reason): Mod A to stand from low toilet but able to mobilize to/from bathroom with CGA using RW Toileting- Clothing Manipulation and Hygiene: Contact guard assist;Sitting/lateral lean;Sit to/from stand Toileting - Clothing Manipulation Details (indicate cue type and reason): assist for clothing mgmt but able to stand and manage peri care without LOB     Functional mobility during ADLs: Contact guard assist;Rolling walker (2 wheels)      Extremity/Trunk Assessment Upper Extremity Assessment Upper  Extremity Assessment: Overall WFL for tasks assessed;Right hand dominant   Lower Extremity Assessment Lower Extremity Assessment: Defer to PT  evaluation        Vision   Vision Assessment?: No apparent visual deficits   Perception     Praxis      Cognition Arousal: Alert Behavior During Therapy: WFL for tasks assessed/performed, Impulsive Overall Cognitive Status: Impaired/Different from baseline Area of Impairment: Following commands, Safety/judgement, Awareness, Memory, Problem solving                   Current Attention Level: Selective Memory: Decreased recall of precautions, Decreased short-term memory Following Commands: Follows one step commands consistently Safety/Judgement: Decreased awareness of safety, Decreased awareness of deficits Awareness: Emergent Problem Solving: Difficulty sequencing, Requires verbal cues, Requires tactile cues General Comments: able to recall sternal precautions but difficulty implementing during tasks. significant improvements in awareness of needs, humorous throughout        Exercises      Shoulder Instructions       General Comments Wife entering during session    Pertinent Vitals/ Pain       Pain Assessment Pain Assessment: No/denies pain  Home Living                                          Prior Functioning/Environment              Frequency  Min 1X/week        Progress Toward Goals  OT Goals(current goals can now be found in the care plan section)  Progress towards OT goals: Progressing toward goals  Acute Rehab OT Goals Patient Stated Goal: go to rehab OT Goal Formulation: With patient/family Time For Goal Achievement: 07/23/23 Potential to Achieve Goals: Good ADL Goals Pt Will Perform Upper Body Dressing: with supervision;sitting Pt Will Perform Lower Body Dressing: with contact guard assist;sit to/from stand Pt Will Transfer to Toilet: with supervision;ambulating Additional ADL Goal #1: Pt to verbalize sternal precautions with no more than min verbal cues  Plan      Co-evaluation                 AM-PAC  OT "6 Clicks" Daily Activity     Outcome Measure   Help from another person eating meals?: A Little Help from another person taking care of personal grooming?: A Little Help from another person toileting, which includes using toliet, bedpan, or urinal?: A Little Help from another person bathing (including washing, rinsing, drying)?: A Lot Help from another person to put on and taking off regular upper body clothing?: A Little Help from another person to put on and taking off regular lower body clothing?: A Lot 6 Click Score: 16    End of Session Equipment Utilized During Treatment: Gait belt;Rolling walker (2 wheels)  OT Visit Diagnosis: Unsteadiness on feet (R26.81);Other abnormalities of gait and mobility (R26.89);Other symptoms and signs involving cognitive function   Activity Tolerance Patient tolerated treatment well   Patient Left Other (comment) (with PT)   Nurse Communication Mobility status        Time: 9629-5284 OT Time Calculation (min): 31 min  Charges: OT General Charges $OT Visit: 1 Visit OT Treatments $Self Care/Home Management : 23-37 mins  Bradd Canary, OTR/L Acute Rehab Services Office: [phone]   Lorre Munroe 07/19/2023, 11:34 AM

## 2023-07-19 NOTE — Progress Notes (Signed)
Physical Therapy Treatment Patient Details Name: Bruce Nunez MRN: [id number] DOB: 1940/09/21 Today's Date: 07/19/2023   History of Present Illness Pt is a 82yo male who was admitted for dramatic decline in function due to DOE and fatigue. 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 PMH/PSH: aortic stenosis, portstate ca, GERD, s/p R inguinal hernia repair 9/2-15, HTN, HLD, asthma    PT Comments  Pt continues to progress towards goals. Frequent reminders for sternal precautions with functional mobility. Pt was able to perform increased distance with gait this session w/o reports of fatigue. Pt Min A to CGA for sit to stand and gait with AD. Poor safety awareness throughout session with frequent reminders to decrease risk for falls.  Due to pt current functional status, home set up and available assistance at home recommending skilled physical therapy services < 3 hours/day on discharge from acute care hospital setting in order to address strength, balance, safety and activity tolerance to decrease risk for falls, injury, immobility and re-hospitalization. Pt tolerated treatment session well.     If plan is discharge home, recommend the following: Supervision due to cognitive status;Help with stairs or ramp for entrance;Direct supervision/assist for medications management;Assist for transportation;A little help with walking and/or transfers   Can travel by private vehicle     No  Equipment Recommendations  Other (comment) (defer to post acute)       Precautions / Restrictions Precautions Precautions: Fall;Sternal;ICD/Pacemaker Precaution Booklet Issued: Yes (comment) Precaution Comments: Pt can remember no pushing/no pulling but does not follow precautions with functional mobility or ADL without significant cues Restrictions Weight Bearing Restrictions: Yes RUE Weight Bearing: Non weight bearing LUE Weight Bearing: Non weight bearing Other  Position/Activity Restrictions: sternal precautions     Mobility  Bed Mobility     General bed mobility comments: Pt received in room standing at toilet. Pt left in recliner at end of session.    Transfers Overall transfer level: Needs assistance Equipment used: Rolling walker (2 wheels) Transfers: Sit to/from Stand Sit to Stand: Min assist, Contact guard assist           General transfer comment: Pt fluctuated berween CGA and MIn A this session for sit to stand with frequent reminders with multi modal cueing for maintaining sternal precautions.    Ambulation/Gait Ambulation/Gait assistance: Min assist, Contact guard assist Gait Distance (Feet): 200 Feet Assistive device: Rolling walker (2 wheels) Gait Pattern/deviations: Step-through pattern, Decreased stride length Gait velocity: decreased Gait velocity interpretation: <1.8 ft/sec, indicate of risk for recurrent falls   General Gait Details: Improved shuffling this session. Continues to require intermittent verbal and tactile cues for navigating RW and to decrease wgt on UE. HR remained steady around 100 bpm throughout session. Uneven step length bil without a LE preference.    Balance Overall balance assessment: Needs assistance Sitting-balance support: Feet supported, No upper extremity supported Sitting balance-Leahy Scale: Fair Sitting balance - Comments: Supervision for safety   Standing balance support: Bilateral upper extremity supported Standing balance-Leahy Scale: Fair Standing balance comment: intermittently reliant on external support to prevent falls.        Cognition Arousal: Alert Behavior During Therapy: WFL for tasks assessed/performed, Impulsive Overall Cognitive Status: Impaired/Different from baseline Area of Impairment: Safety/judgement, Memory, Awareness, Problem solving       Memory: Decreased recall of precautions, Decreased short-term memory Following Commands: Follows one step commands  consistently Safety/Judgement: Decreased awareness of safety, Decreased  awareness of deficits   Problem Solving: Difficulty sequencing, Requires verbal cues, Requires tactile cues General Comments: able to recall sternal precautions but difficulty implementing during tasks. significant improvements in awareness of needs           General Comments General comments (skin integrity, edema, etc.): Spouse present throughout session.      Pertinent Vitals/Pain Pain Assessment Pain Assessment: No/denies pain Faces Pain Scale: No hurt Facial Expression: Relaxed, neutral Body Movements: Absence of movements Muscle Tension: Relaxed Compliance with ventilator (intubated pts.): N/A Vocalization (extubated pts.): Talking in normal tone or no sound CPOT Total: 0     PT Goals (current goals can now be found in the care plan section) Acute Rehab PT Goals Patient Stated Goal: didn't state PT Goal Formulation: Patient unable to participate in goal setting Time For Goal Achievement: 07/23/23 Potential to Achieve Goals: Good Progress towards PT goals: Progressing toward goals    Frequency    Min 1X/week      PT Plan  Continue with current POC       AM-PAC PT "6 Clicks" Mobility   Outcome Measure  Help needed turning from your back to your side while in a flat bed without using bedrails?: A Little Help needed moving from lying on your back to sitting on the side of a flat bed without using bedrails?: A Little Help needed moving to and from a bed to a chair (including a wheelchair)?: A Little Help needed standing up from a chair using your arms (e.g., wheelchair or bedside chair)?: A Little Help needed to walk in hospital room?: A Little Help needed climbing 3-5 steps with a railing? : A Lot 6 Click Score: 17    End of Session Equipment Utilized During Treatment: Gait belt Activity Tolerance: Patient tolerated treatment well Patient left: in chair;with chair alarm set;with call  bell/phone within reach;with family/visitor present Nurse Communication: Mobility status PT Visit Diagnosis: Unsteadiness on feet (R26.81);Muscle weakness (generalized) (M62.81);Difficulty in walking, not elsewhere classified (R26.2)     Time: 7253-6644 PT Time Calculation (min) (ACUTE ONLY): 23 min  Charges:    $Gait Training: 8-22 mins $Therapeutic Activity: 8-22 mins PT General Charges $$ ACUTE PT VISIT: 1 Visit                    Harrel Carina, DPT, CLT  Acute Rehabilitation Services Office: [phone] (Secure chat preferred)    Bruce Nunez 07/19/2023, 11:37 AM

## 2023-07-19 NOTE — TOC Progression Note (Signed)
Transition of Care [facility]) - Progression Note    Patient Details  Name: ZERRICK URWIN MRN: [id number] Date of Birth: 11-09-1940  [facility]) CM/SW Contact  Eduard Roux, [place] Phone Number: 07/19/2023, 2:19 PM  Clinical Narrative:     Patient insurance auth pending-   TOC will continue to follow  Antony Blackbird, MSW, LCSW Clinical Social Worker    Expected Discharge Plan: Home w Home Health Services Barriers to Discharge: Barriers Resolved  Expected Discharge Plan and Services   Discharge Planning Services: CM Consult Post Acute Care Choice: IP Rehab Living arrangements for the past 2 months: Single Family Home Expected Discharge Date: 07/15/23                                     Social Determinants of Health (SDOH) Interventions SDOH Screenings   Food Insecurity: No Food Insecurity (07/08/2023)  Housing: Low Risk  (07/08/2023)  Transportation Needs: No Transportation Needs (07/08/2023)  Utilities: Not At Risk (07/08/2023)  Tobacco Use: Low Risk  (07/05/2023)    Readmission Risk Interventions     No data to display

## 2023-07-19 NOTE — Progress Notes (Signed)
CARDIAC REHAB PHASE I    Pt sitting up in bed, feeling well today. Pt reports active morning. Pt oob in chair,ambulated in room and assisted with bath. Pt pain is much improved today. Assisted with setup for breakfast. Encouraged IS and continued mobility. Pending discharge to SNF. Will continue to follow.   [id number]  Woodroe Chen, RN BSN 07/19/2023 10:36 AM

## 2023-07-19 NOTE — Progress Notes (Addendum)
[address]       [place] [address]             [phone]      2 Days Post-Op Procedure(s) (LRB): PACEMAKER IMPLANT (N/A) Subjective: The patient states he had a rough day yesterday but is feeling much better this AM since he was able to give himself a sponge bath and stand independently.   Objective: Vital signs in last 24 hours: Temp:  [97.6 F (36.4 C)-98.2 F (36.8 C)] 98.2 F (36.8 C) (11/22 0406) Pulse Rate:  [71-111] 99 (11/22 0406) Cardiac Rhythm: Normal sinus rhythm (11/21 1920) Resp:  [17-21] 20 (11/22 0630) BP: (90-164)/(65-82) 136/80 (11/22 0406) SpO2:  [94 %-99 %] 94 % (11/22 0406) Weight:  [64.6 kg] 64.6 kg (11/22 0630)  Hemodynamic parameters for last 24 hours:    Intake/Output from previous day: 11/21 0701 - 11/22 0700 In: 120 [P.O.:120] Out: 1000 [Urine:1000] Intake/Output this shift: No intake/output data recorded.  General appearance: alert, cooperative, and no distress Neurologic: Alert and oriented x3 this AM, no confusion this AM, no acute abnormality Heart: regular rate and rhythm-sinus tachycardia, some PVCs, no murmur Lungs: clear to auscultation bilaterally Abdomen: soft, non-tender; bowel sounds normal; no masses,  no organomegaly Extremities: extremities normal, atraumatic, no cyanosis or edema Wound: Clean and dry sternal incision without sign of infection, pacemaker site with clean dressing in place  Lab Results: No results for input(s): "WBC", "HGB", "HCT", "PLT" in the last 72 hours. BMET:  Recent Labs    07/17/23 0433  NA 138  K 3.5  CL 104  CO2 26  GLUCOSE 120*  BUN 10  CREATININE 0.92  CALCIUM 8.6*    PT/INR: No results for input(s): "LABPROT", "INR" in the last 72 hours. ABG    Component Value Date/Time   PHART 7.363 07/05/2023 2208   HCO3 21.2 07/05/2023 2208   TCO2 22 07/05/2023 2208   ACIDBASEDEF 4.0 (H) 07/05/2023 2208   O2SAT 90 07/05/2023 2208   CBG (last 3)  No results for  input(s): "GLUCAP" in the last 72 hours.  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 Intermittent confusion at times. PT/OT/SLP, to be continued at [facility]. On ASA and and Eliquis will be restarted on 11/24 due to postop afib.   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 Echo with LVEF 60-65%, aortic valve working appropriately. Pacemaker placed per EP 11/20. NSR-Sinus tachycardia with PVCs, HR 99. Pacemaker working appropriately per EP. SBP 90-164, mostly in the 130s. On Toprol XL 100mg , will restart Eliquis 11/24 per EP recommendation.   Pulm: Saturating well on RA. Continue nebs, mucinex PRN, flutter valve and IS. Continue ambulation.    GI: Patient has not had bowel movement since diarrhea 11/14 when stool softeners were discontinued. On daily stool softeners, will give lactulose. Pt has not been eating much, does admit to eating some soup and grilled cheese, states he does not like the ensure shakes. Encouraged increasing diet as tolerated.    Endo: DM on Glimepiride at home, will restart at discharge since pt had not had a great appetite. Hypothyroid, on levothyroxine.    Renal: Cr 0.92. Below preop weight, Lasix discontinued. Continue Flomax   Deconditioning: Continue PT/OT/SLP, SNF at discharge   Dispo: Pt is stable for discharge, pending SNF authorization. Hopefully can d/c to SNF today.    LOS: 14 days  Jenny Reichmann, PA-C 07/19/2023

## 2023-07-20 DIAGNOSIS — M6281 Muscle weakness (generalized): Secondary | ICD-10-CM | POA: Insufficient documentation

## 2023-07-20 DIAGNOSIS — Z951 Presence of aortocoronary bypass graft: Secondary | ICD-10-CM | POA: Diagnosis not present

## 2023-07-20 DIAGNOSIS — Z9889 Other specified postprocedural states: Secondary | ICD-10-CM | POA: Diagnosis not present

## 2023-07-20 DIAGNOSIS — I2510 Atherosclerotic heart disease of native coronary artery without angina pectoris: Secondary | ICD-10-CM | POA: Diagnosis not present

## 2023-07-20 DIAGNOSIS — Z741 Need for assistance with personal care: Secondary | ICD-10-CM | POA: Diagnosis not present

## 2023-07-20 DIAGNOSIS — Z7401 Bed confinement status: Secondary | ICD-10-CM | POA: Diagnosis not present

## 2023-07-20 DIAGNOSIS — M6250 Muscle wasting and atrophy, not elsewhere classified, unspecified site: Secondary | ICD-10-CM | POA: Insufficient documentation

## 2023-07-20 DIAGNOSIS — I442 Atrioventricular block, complete: Secondary | ICD-10-CM | POA: Diagnosis not present

## 2023-07-20 DIAGNOSIS — I480 Paroxysmal atrial fibrillation: Secondary | ICD-10-CM | POA: Diagnosis not present

## 2023-07-20 DIAGNOSIS — M6289 Other specified disorders of muscle: Secondary | ICD-10-CM | POA: Diagnosis not present

## 2023-07-20 DIAGNOSIS — I10 Essential (primary) hypertension: Secondary | ICD-10-CM | POA: Diagnosis not present

## 2023-07-20 DIAGNOSIS — M6259 Muscle wasting and atrophy, not elsewhere classified, multiple sites: Secondary | ICD-10-CM | POA: Diagnosis not present

## 2023-07-20 DIAGNOSIS — R2689 Other abnormalities of gait and mobility: Secondary | ICD-10-CM | POA: Diagnosis not present

## 2023-07-20 DIAGNOSIS — R0602 Shortness of breath: Secondary | ICD-10-CM | POA: Diagnosis not present

## 2023-07-20 DIAGNOSIS — R2681 Unsteadiness on feet: Secondary | ICD-10-CM | POA: Diagnosis not present

## 2023-07-20 DIAGNOSIS — Z952 Presence of prosthetic heart valve: Secondary | ICD-10-CM | POA: Diagnosis not present

## 2023-07-20 DIAGNOSIS — I350 Nonrheumatic aortic (valve) stenosis: Secondary | ICD-10-CM | POA: Diagnosis not present

## 2023-07-20 DIAGNOSIS — R41841 Cognitive communication deficit: Secondary | ICD-10-CM | POA: Diagnosis not present

## 2023-07-20 DIAGNOSIS — R531 Weakness: Secondary | ICD-10-CM | POA: Diagnosis not present

## 2023-07-20 DIAGNOSIS — R5381 Other malaise: Secondary | ICD-10-CM | POA: Diagnosis not present

## 2023-07-20 LAB — GLUCOSE, CAPILLARY: Glucose-Capillary: 156 mg/dL — ABNORMAL HIGH (ref 70–99)

## 2023-07-20 MED ORDER — INSULIN ASPART 100 UNIT/ML IJ SOLN
0.0000 [IU] | Freq: Three times a day (TID) | INTRAMUSCULAR | Status: DC
  Administered 2023-07-20: 2 [IU] via SUBCUTANEOUS

## 2023-07-20 NOTE — Progress Notes (Signed)
Patient discharged to skilled nursing facility. All IV's have been removed and discharge instructions were provided per MD.

## 2023-07-20 NOTE — TOC Progression Note (Signed)
Transition of Care [facility]) - Progression Note    Patient Details  Name: Bruce Nunez MRN: [id number] Date of Birth: 08/21/1941  [facility]) CM/SW Contact  Patrice Paradise, LCSW Phone Number: 07/20/2023, 9:11 AM  Clinical Narrative:     CSW checked pt's auth and it has been approved. Reference# [id number] auth dates 11/22-11/26. CSW reached out to [facility] and they stated that patient could accepted patient today. CSW alerted MD.  [facility] team will continue to assist with discharge planning needs.   Expected Discharge Plan: Home w Home Health Services Barriers to Discharge: Barriers Resolved  Expected Discharge Plan and Services   Discharge Planning Services: CM Consult Post Acute Care Choice: IP Rehab Living arrangements for the past 2 months: Single Family Home Expected Discharge Date: 07/15/23                                     Social Determinants of Health (SDOH) Interventions SDOH Screenings   Food Insecurity: No Food Insecurity (07/08/2023)  Housing: Low Risk  (07/08/2023)  Transportation Needs: No Transportation Needs (07/08/2023)  Utilities: Not At Risk (07/08/2023)  Tobacco Use: Low Risk  (07/05/2023)    Readmission Risk Interventions     No data to display

## 2023-07-20 NOTE — TOC Transition Note (Addendum)
Transition of Care [facility]) - CM/SW Discharge Note   Patient Details  Name: Bruce Nunez MRN: [id number] Date of Birth: Dec 22, 1940  [facility]) CM/SW Contact:  Patrice Paradise, LCSW Phone Number: 07/20/2023, 11:03 AM   Clinical Narrative:     Patient will Discharge to: [facility] Discharge Date:07/20/2023 Family Notified: spouse Transport By: Sharin Mons   Per MD patient is ready for discharge. RN, patient, and facility notified of discharge. Discharge Summary sent to facility. RN given number for [phone] room 212A. Ambulance transport requested for patient.    Clinical Social Worker signing off.    Final next level of care: Skilled Nursing Facility Barriers to Discharge: Barriers Resolved   Patient Goals and CMS Choice CMS Medicare.gov Compare Post Acute Care list provided to:: Patient Represenative (must comment) Choice offered to / list presented to : Spouse  Discharge Placement                Patient chooses bed at:  [facility]) Patient to be transferred to facility by: [facility] Name of family member notified: spouse Patient and family notified of of transfer: 07/20/23  Discharge Plan and Services Additional resources added to the After Visit Summary for     Discharge Planning Services: CM Consult Post Acute Care Choice: IP Rehab                               Social Determinants of Health (SDOH) Interventions SDOH Screenings   Food Insecurity: No Food Insecurity (07/08/2023)  Housing: Low Risk  (07/08/2023)  Transportation Needs: No Transportation Needs (07/08/2023)  Utilities: Not At Risk (07/08/2023)  Tobacco Use: Low Risk  (07/05/2023)     Readmission Risk Interventions     No data to display

## 2023-07-20 NOTE — Progress Notes (Signed)
      [address]       [place]une[place] [address]             [phone]        3 Days Post-Op Procedure(s) (LRB): PACEMAKER IMPLANT (N/A)  Subjective: Patient states he typically is cold but  "hot flash" last night. No fever. He did have a bowel movement yesterday.  Objective: Vital signs in last 24 hours: Temp:  [97.7 F (36.5 C)-98.9 F (37.2 C)] 97.7 F (36.5 C) (11/23 0320) Pulse Rate:  [97-101] 97 (11/23 0320) Cardiac Rhythm: Normal sinus rhythm;Bundle branch block;Heart block (11/22 1900) Resp:  [18-22] 18 (11/23 0320) BP: (115-153)/(55-80) 121/55 (11/23 0320) SpO2:  [92 %-99 %] 92 % (11/23 0320) Weight:  [63.5 kg] 63.5 kg (11/23 0320)  Pre op weight 66.4 kg Current Weight  07/20/23 63.5 kg      Intake/Output from previous day: 11/22 0701 - 11/23 0700 In: 600 [P.O.:600] Out: 800 [Urine:800]   Physical Exam:  Cardiovascular: RRR Pulmonary: Clear to auscultation bilaterally Abdomen: Soft, non tender, bowel sounds present. Extremities: No lower extremity edema. Wounds: Clean and dry.  No erythema or signs of infection.  Lab Results: CBC:No results for input(s): "WBC", "HGB", "HCT", "PLT" in the last 72 hours. BMET: No results for input(s): "NA", "K", "CL", "CO2", "GLUCOSE", "BUN", "CREATININE", "CALCIUM" in the last 72 hours.  PT/INR:  Lab Results  Component Value Date   INR 1.8 (H) 07/05/2023   INR 1.0 07/03/2023   INR 0.90 11/17/2012   ABG:  INR: Will add last result for INR, ABG once components are confirmed Will add last 4 CBG results once components are confirmed  Assessment/Plan:  1. 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 Previous a fib. SR, first degree heart block. On Toprol XL 100 mg daily. Apixaban to be started in am (per EP). 2.  Pulmonary - On room air. Encourage incentive spirometer and flutter valve. 3. History of CVA- has some residual frontal lobe residual deficit. 4.  Expected post op acute blood loss anemia - Last H and H  stable at 9.7 and 29.8 5. History of hypothyroidism-continue Levothyroxine 6. History of DM. No routine CBGs so will order.  Pre op HGA1C 6.1.Will likely restart Glimepiride at discharge as appetite has improved. 7.  Deconditioning-continue PT/OT. Will need SNF;awaiting insurance authorization  Bruce Nunez [facility] 7:31 AM

## 2023-07-20 NOTE — Progress Notes (Signed)
Mobility Specialist Progress Note:   07/20/23 1122  Therapy Vitals  Temp 97.6 F (36.4 C)  Temp Source Oral  Pulse Rate 96  Resp 18  BP 110/84  Mobility  Activity Ambulated with assistance in hallway  Level of Assistance Contact guard assist, steadying assist  Assistive Device Front wheel walker  Distance Ambulated (ft) 240 ft  RUE Weight Bearing NWB  LUE Weight Bearing NWB  Activity Response Tolerated well  Mobility Referral Yes  $Mobility charge 1 Mobility  Mobility Specialist Start Time (ACUTE ONLY) 1052  Mobility Specialist Stop Time (ACUTE ONLY) 1111  Mobility Specialist Time Calculation (min) (ACUTE ONLY) 19 min    Pt received in bed, agreeable to mobility. Asymptomatic throughout w/ no complaints. Returned to room w/o fault. Pt left in bed with call bell and all needs met. Bed alarm on.   [facility]ante Earlene Plater Mobility Specialist Please contact via Special educational needs teacher or Rehab office at [phone]

## 2023-07-23 DIAGNOSIS — I2510 Atherosclerotic heart disease of native coronary artery without angina pectoris: Secondary | ICD-10-CM | POA: Diagnosis not present

## 2023-07-23 DIAGNOSIS — Z951 Presence of aortocoronary bypass graft: Secondary | ICD-10-CM | POA: Diagnosis not present

## 2023-07-23 DIAGNOSIS — R5381 Other malaise: Secondary | ICD-10-CM | POA: Diagnosis not present

## 2023-07-23 NOTE — Patient Instructions (Addendum)
Discharge Instructions:  Patient may shower Medication changes: Please ensure patient is receiving all medications on provided medicine list from todays visit.. RX given for new medications Continue sternal precautions. No lifting, pushing, pulling with arms over 8-10 lbs Contact our office at [phone] if questions/concerns arise

## 2023-07-23 NOTE — Progress Notes (Signed)
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 The patient's early postoperative recovery while in the hospital was notable for increased agitation and delirium.  He developed post operative Atrial Fibrillation and was treated with cardizem due to dye allergy.  He converted but after several days of use, he developed complete heart block.  This ultimately did not resolve and he required placement of a PPM.  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  He was deconditioned and he was ultimately discharged to SNF for rehabilitation.  Since hospital discharge the patient reports overall he is doing well.  He is making good progress at rehab.  The food isn't very good and he has lost weight since admission.  He is very anxious to get home from SNF and is hoping this will happen next Friday.  He is ambulating without difficulty.  Surgical incisions are healing without evidence of infection.  He states that he has not been receiving all medications from the SNF facility.  Current Outpatient Medications  Medication Sig Dispense Refill   acetaminophen (TYLENOL) 325 MG tablet Take 1-2 tablets (325-650 mg total) by mouth every 6 (six) hours as needed for mild pain (pain score 1-3).     albuterol (PROVENTIL HFA;VENTOLIN HFA) 108 (90 BASE) MCG/ACT inhaler Inhale 2 puffs into the lungs every 6 (six) hours as needed for shortness of breath.     apixaban (ELIQUIS) 5 MG TABS tablet Take 1 tablet (5 mg total) by mouth 2 (two) times daily.     aspirin 81 MG chewable tablet Chew 1 tablet (81 mg total) by mouth daily.     budesonide-formoterol (SYMBICORT) 160-4.5 MCG/ACT inhaler Inhale 2 puffs into the lungs 2 (two) times daily.     cyanocobalamin (,VITAMIN B-12,) 1000 MCG/ML injection Inject 1 mL into the skin every 30 (thirty) days.      dicyclomine (BENTYL) 10 MG capsule Take 10 mg by mouth 2 (two) times daily as needed for cramping (abdominal pain/diarrhea.).     feeding supplement (ENSURE ENLIVE / ENSURE PLUS) LIQD Take 237 mLs by mouth 3 (three) times daily with meals.     fexofenadine (ALLEGRA) 180 MG tablet Take 180 mg by mouth daily.     fluticasone (FLONASE) 50 MCG/ACT nasal spray Place 1 spray into both nostrils daily.      glimepiride (AMARYL) 2 MG tablet Take 2 mg by mouth in the morning.     levothyroxine (SYNTHROID) 75 MCG tablet Take 75 mcg by mouth daily before breakfast.     metoprolol succinate (TOPROL-XL) 100 MG 24 hr tablet Take 1 tablet (100 mg total) by mouth daily. Take with or immediately following a meal.     Multiple Vitamin (MULTIVITAMIN WITH MINERALS) TABS tablet Take 1 tablet by mouth daily.     omeprazole (PRILOSEC) 20 MG capsule Take 20 mg by mouth in the morning.     rosuvastatin (CRESTOR) 20 MG tablet Take 1 tablet (20 mg total) by mouth daily. 90 tablet 1   solifenacin (VESICARE) 10 MG tablet Take 10 mg by mouth in the morning.     Tamsulosin HCl (FLOMAX) 0.4 MG CAPS Take 0.4 mg by mouth 2 (two) times daily.      No  current facility-administered medications for this visit.    Physical Exam:  BP (!) 162/89 (BP Location: Right Arm, Patient Position: Sitting)   Pulse 88   Resp 20   Ht 5\' 7"  (1.702 m)   Wt 139 lb (63 kg)   SpO2 95% Comment: RA  BMI 21.77 kg/m   Gen: NAD Heart: RRR Lungs: CTA bilaterally Ext: + edema Incisions: well healed sternotomy and PPM site  Diagnostic Tests:  CXR: looks great, PPM in place, trace left pleural effusion  A/P:  S/P CABG x 1 and AVR- doing very well, making good progress at SNF, hoping to be discharged home soon CHB/Atrial Fibrillation- PPM in place, per EP.Marland Kitchen on Eliquis for stroke prophylaxis HTN- Norvasc has been resumed at Cardiology, remains elevated today.. but SNF isn't administering medications Sternal precautions as discussed.. no  driving until he is cleared at follow up visit with Dr. Leafy Ro, will hold off on cardiac rehab for now can enroll once discharged from SNF  Lowella Dandy, PA-C [facility] [phone]

## 2023-07-24 ENCOUNTER — Telehealth (HOSPITAL_COMMUNITY): Payer: Self-pay

## 2023-07-24 NOTE — Telephone Encounter (Signed)
Per phase I cardiac rehab, fax referral to [facility].

## 2023-07-28 NOTE — Progress Notes (Signed)
 Cardiology Office Note:  .   Date:  07/29/2023  ID:  Bruce Nunez, DOB 1940/12/27, MRN [id number] PCP: Gus Height, PA  [facility] Providers Cardiologist:  None    History of Present Illness: .   Bruce Nunez is a 82 y.o. 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  07/17/2023 PPM implantation 07/16/2023 echo  EF 60 to 65%, AV replacement in the aortic position without regurgitation, trivial pericardial effusion 07/05/2023 CABG x 1 LIMA > LAD 07/05/2023 AVR 05/03/2023 LHC multivessel CAD, severe aortic regurgitation  He was admitted on 07/05/2023 to 07/20/2023, underwent CABG x 1 LIMA to LAD and AVR utilizing a 21 mm Inspiris aortic valve.  He suffered with some delirium and agitation postoperatively, initially treated successfully with medications however he continued to wax and wane, neurology was consulted, MRI of the brain revealed punctate foci revealing subacute infarcts.  He developed rate controlled postoperative atrial fibrillation, he was started on diltiazem secondary to iodine contrast allergy, Plavix was transition to Eliquis for further stroke prevention.  He subsequently developed first-degree AV block, was later found to be in complete heart block, nodal blocking agents were stopped however the ultimate recommendation was to proceed with a PPM which was completed on 07/17/2023.  He was ultimately able to discharge to SNF on 11/23.  He presents today accompanied by his wife for follow-up after his recent hospitalization as outlined above.  He is currently at a SNF, appear to be doing remarkably well. He has some pedal edema--lasix was stopped at hospital discharge. He denies chest pain, palpitations, dyspnea, pnd, orthopnea, n, v, dizziness, syncope, edema, weight gain, or early satiety.   ROS: Review of Systems  Respiratory:  Positive for shortness of  breath.   Cardiovascular:  Positive for leg swelling.  All other systems reviewed and are negative.    Studies Reviewed: .        Cardiac Studies & Procedures   CARDIAC CATHETERIZATION  CARDIAC CATHETERIZATION 05/03/2023  Narrative   Prox RCA to Mid RCA lesion is 50% stenosed.   Mid LM to Dist LM lesion is 30% stenosed.   Prox Cx to Dist Cx lesion is 50% stenosed.   Prox LAD to Mid LAD lesion is 50% stenosed.   1st Diag lesion is 60% stenosed.   There is severe (4+) aortic regurgitation.  1.  Moderate multivessel coronary artery disease with mild distal left main plaquing of 30%, moderate diffuse LAD stenosis of 50%, mild to moderate mid circumflex stenosis of 40 to 50%, and moderate mid RCA stenosis of 50%. 2.  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  Severe right subclavian tortuosity makes cardiac catheterization very difficult from right radial access.  If patient requires future cardiac catheterization, avoid right radial access.  Recommendations: Heart team review, cardiac CTA (TAVR protocol) to evaluate if there is enough aortic valve calcification to perform TAVR as aortic insufficiency appears to be the primary valve lesion.  The patient has a very wide pulse pressure and angiographically has severe AI.  Findings Coronary Findings Diagnostic  Dominance: Right  Left Main Mid LM to Dist LM lesion is 30% stenosed.  Left Anterior Descending The LAD has moderate diffuse 50% stenosis throughout the proximal and mid vessel.  The first diagonal has a 50 to 60% ostial stenosis. Prox LAD to Mid LAD lesion is 50%  stenosed.  First Diagonal Branch 1st Diag lesion is 60% stenosed.  Left Circumflex There is mild diffuse disease throughout the vessel. Prox Cx to Dist Cx lesion is 50% stenosed. The lesion is moderately calcified.  Right Coronary Artery There is mild diffuse disease throughout the vessel. Dominant vessel with no high-grade CAD.   There is moderate diffuse calcification present.  The mid RCA has a 50% stenosis.  The PDA and PLA branches are patent with no significant stenoses.  The distal vessel has mild diffuse plaquing with no stenosis. Prox RCA to Mid RCA lesion is 50% stenosed. The lesion is moderately calcified.  Intervention  No interventions have been documented.     ECHOCARDIOGRAM  ECHOCARDIOGRAM LIMITED 07/16/2023  Narrative ECHOCARDIOGRAM LIMITED REPORT    Patient Name:   Bruce Nunez Date of Exam: 07/16/2023 Medical Rec #:  [id number]     Height:       67.0 in Accession #:    [id number]    Weight:       151.5 lb Date of Birth:  [birth year]      BSA:          1.797 m Patient Age:    82 years      BP:           125/67 mmHg Patient Gender: M             HR:           93 bpm. Exam Location:  Inpatient  Procedure: Limited Echo, Color Doppler and Cardiac Doppler  Indications:     s/p AVR  History:         Patient has prior history of Echocardiogram examinations, most recent 07/06/2023. Risk Factors:Hypertension, Diabetes and Dyslipidemia. 07/06/23 CABG and AVR with 21mm Inspiris Resilia Bioprosthetic. Aortic Valve: 21 mm Inspiris Resilia bioprosthetic valve is present in the aortic position.  Sonographer:     Irving Burton Senior RDCS Referring Phys:  [id number] Sheilah Pigeon Diagnosing Phys: Armanda Magic MD  IMPRESSIONS   1. Left ventricular ejection fraction, by estimation, is 60 to 65%. The left ventricle has normal function. The left ventricle has no regional wall motion abnormalities. Left ventricular diastolic function could not be evaluated. 2. Right ventricular systolic function is normal. The right ventricular size is normal. Tricuspid regurgitation signal is inadequate for assessing PA pressure. 3. The mitral valve is normal in structure. No evidence of mitral valve regurgitation. No evidence of mitral stenosis. 4. The aortic valve has been repaired/replaced. Aortic valve regurgitation is not  visualized. No aortic stenosis is present. There is a 21 mm Inspiris Resilia bioprosthetic valve present in the aortic position. Aortic valve area, by VTI measures 2.26 cm. Aortic valve mean gradient measures 6.0 mmHg. Aortic valve Vmax measures 1.68 m/s. 5. The inferior vena cava is normal in size with greater than 50% respiratory variability, suggesting right atrial pressure of 3 mmHg. 6. Trivial pericardial effusion is present. The pericardial effusion is surrounding the apex.  FINDINGS Left Ventricle: Left ventricular ejection fraction, by estimation, is 60 to 65%. The left ventricle has normal function. The left ventricle has no regional wall motion abnormalities. The left ventricular internal cavity size was normal in size. There is no left ventricular hypertrophy. Left ventricular diastolic function could not be evaluated.  Right Ventricle: The right ventricular size is normal. No increase in right ventricular wall thickness. Right ventricular systolic function is normal. Tricuspid regurgitation signal is inadequate for assessing PA pressure.  Left Atrium: Left  atrial size was normal in size.  Right Atrium: Right atrial size was normal in size. Prominent Chiari network.  Pericardium: Trivial pericardial effusion is present. The pericardial effusion is surrounding the apex.  Mitral Valve: The mitral valve is normal in structure. Mild to moderate mitral annular calcification. No evidence of mitral valve stenosis.  Tricuspid Valve: The tricuspid valve is normal in structure. Tricuspid valve regurgitation is not demonstrated. No evidence of tricuspid stenosis.  Aortic Valve: The aortic valve has been repaired/replaced. Aortic valve regurgitation is not visualized. No aortic stenosis is present. Aortic valve mean gradient measures 6.0 mmHg. Aortic valve peak gradient measures 11.3 mmHg. Aortic valve area, by VTI measures 2.26 cm. There is a 21 mm Inspiris Resilia bioprosthetic valve present  in the aortic position.  Pulmonic Valve: The pulmonic valve was normal in structure. Pulmonic valve regurgitation is mild. No evidence of pulmonic stenosis.  Aorta: The aortic root is normal in size and structure.  Venous: The inferior vena cava is normal in size with greater than 50% respiratory variability, suggesting right atrial pressure of 3 mmHg.  IAS/Shunts: No atrial level shunt detected by color flow Doppler.  Additional Comments: Spectral Doppler performed. Color Doppler performed.  LEFT VENTRICLE PLAX 2D LVOT diam:     2.00 cm LV SV:         56 LV SV Index:   31 LVOT Area:     3.14 cm   RIGHT VENTRICLE RV S prime:     8.81 cm/s TAPSE (M-mode): 1.4 cm  AORTIC VALVE AV Area (Vmax):    2.36 cm AV Area (Vmean):   2.30 cm AV Area (VTI):     2.26 cm AV Vmax:           168.00 cm/s AV Vmean:          111.000 cm/s AV VTI:            0.249 m AV Peak Grad:      11.3 mmHg AV Mean Grad:      6.0 mmHg LVOT Vmax:         126.00 cm/s LVOT Vmean:        81.200 cm/s LVOT VTI:          0.179 m LVOT/AV VTI ratio: 0.72   SHUNTS Systemic VTI:  0.18 m Systemic Diam: 2.00 cm  Armanda Magic MD Electronically signed by Armanda Magic MD Signature Date/Time: 07/16/2023/1:37:35 PM    Final (Updated)   TEE  ECHO INTRAOPERATIVE TEE 07/05/2023  Narrative *INTRAOPERATIVE TRANSESOPHAGEAL REPORT *    Patient Name:   Bruce Nunez  Date of Exam: 07/05/2023 Medical Rec #:  [id number]      Height:       67.0 in Accession #:    [id number]     Weight:       146.3 lb Date of Birth:  June 24, 1941       BSA:          1.77 m Patient Age:    82 years       BP:           161/54 mmHg Patient Gender: M              HR:           81 bpm. Exam Location:  Anesthesiology  Transesophogeal exam was perform intraoperatively during surgical procedure. Patient was closely monitored under general anesthesia during the entirety of examination.  Indications:     I35.1 Nonrheumatic  aortic (valve)  insufficiency Performing Phys: [id number] [name] Diagnosing Phys: Earl Lites Stoltzfus  Complications: No known complications during this procedure. POST-OP IMPRESSIONS _ Left Ventricle: The left ventricle is unchanged from pre-bypass. _ Right Ventricle: The right ventricle appears unchanged from pre-bypass. _ Aorta: The aorta appears unchanged from pre-bypass. _ Left Atrial Appendage: The left atrial appendage appears unchanged from pre-bypass. _ Aortic Valve: A bioprosthetic valve was placed, leaflets are freely mobile Size; 21mm. The gradient recorded across the prosthetic valve is within the expected range. No perivalvular leak noted. Mean PG _ Mitral Valve: The mitral valve appears unchanged from pre-bypass. _ Tricuspid Valve: The tricuspid valve appears unchanged from pre-bypass. _ Pulmonic Valve: The pulmonic valve appears unchanged from pre-bypass. _ Interatrial Septum: The interatrial septum appears unchanged from pre-bypass. _ Pericardium: The pericardium appears unchanged from pre-bypass. _ Comments: S/P AVR CABG. No new or worsening wall motion or valvular abnormalities post procedure. 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 Seated appropriately without rock or leak. Gradients WNL.  PRE-OP FINDINGS Left Ventricle: The left ventricle has normal systolic function, with an ejection fraction of 60-65%. The cavity size was normal. Left ventricular diastolic parameters are consistent with Grade I diastolic dysfunction (impaired relaxation).   Right Ventricle: The right ventricle has normal systolic function. The cavity was normal. There is no increase in right ventricular wall thickness. Right ventricular systolic pressure is normal.  Left Atrium: Left atrial size was normal in size. No left atrial/left atrial appendage thrombus was detected. Left atrial appendage velocity is normal at greater than 40 cm/s.  Right Atrium: Right atrial size was normal in  size.  Interatrial Septum: No atrial level shunt detected by color flow Doppler. The interatrial septum appears to be lipomatous. There is no evidence of a patent foramen ovale.  Pericardium: There is no evidence of pericardial effusion. There is no pleural effusion.  Mitral Valve: The mitral valve is normal in structure. Mitral valve regurgitation is trivial by color flow Doppler. There is no evidence of mitral valve vegetation. There is No evidence of mitral stenosis.  Tricuspid Valve: The tricuspid valve was normal in structure. Tricuspid valve regurgitation is trivial by color flow Doppler. No evidence of tricuspid stenosis is present. There is no evidence of tricuspid valve vegetation.  Aortic Valve: The aortic valve is tricuspid. Aortic valve regurgitation is moderate to severe. The jet is eccentric anteriorly directed. There is mild stenosis of the aortic valve, with a calculated valve area of 1.40 cm. AI PHT , VC .7cm, diastolic reversal descending thoracic aorta, EROA PISA .32cm2.  Pulmonic Valve: The pulmonic valve was normal in structure, with normal. No evidence of pumonic stenosis. Pulmonic valve regurgitation is mild by color flow Doppler.   Aorta: The aortic root and ascending aorta are normal in size and structure.  Pulmonary Artery: Theone Murdoch catheter present on the right. The pulmonary artery is of normal size.  Shunts: There is no evidence of an atrial septal defect.  +--------------+--------++ LEFT VENTRICLE         +--------------+--------++ PLAX 2D                +--------------+--------++ LVIDd:        4.80 cm  +--------------+--------++ LVIDs:        3.00 cm  +--------------+--------++ LVOT diam:    2.00 cm  +--------------+--------++ LV SV:        73 ml    +--------------+--------++ LV SV Index:  40.86    +--------------+--------++  LVOT Area:    3.14 cm +--------------+--------++                         +--------------+--------++  +------------------+------------++ AORTIC VALVE                   +------------------+------------++ AV Area (Vmax):   1.34 cm     +------------------+------------++ AV Area (Vmean):  1.36 cm     +------------------+------------++ AV Area (VTI):    1.40 cm     +------------------+------------++ AV Vmax:          279.00 cm/s  +------------------+------------++ AV Vmean:         193.000 cm/s +------------------+------------++ AV VTI:           0.601 m      +------------------+------------++ AV Peak Grad:     31.1 mmHg    +------------------+------------++ AV Mean Grad:     17.0 mmHg    +------------------+------------++ LVOT Vmax:        119.00 cm/s  +------------------+------------++ LVOT Vmean:       83.800 cm/s  +------------------+------------++ LVOT VTI:         0.267 m      +------------------+------------++ LVOT/AV VTI ratio:0.44         +------------------+------------++  +-------------+-------++ AORTA                +-------------+-------++ Ao Root diam:3.10 cm +-------------+-------++ Ao STJ diam: 2.4 cm  +-------------+-------++ Ao Asc diam: 3.20 cm +-------------+-------++  +--------------+----------++ MITRAL VALVE              +--------------+-------+ +--------------+----------++  SHUNTS                MV Area (PHT):3.63 cm    +--------------+-------+ +--------------+----------++  Systemic VTI: 0.27 m  MV Peak grad: 3.0 mmHg    +--------------+-------+ +--------------+----------++  Systemic Diam:2.00 cm MV Mean grad: 1.0 mmHg    +--------------+-------+ +--------------+----------++ MV Vmax:      0.86 m/s   +--------------+----------++ MV Vmean:     53.1 cm/s  +--------------+----------++ MV PHT:       60.61 msec +--------------+----------++ MV Decel Time:209 msec    +--------------+----------++ +--------------+----------++ MV E velocity:63.50 cm/s +--------------+----------++ MV A velocity:80.40 cm/s +--------------+----------++ MV E/A ratio: 0.79       +--------------+----------++   Hester Mates Electronically signed by Hester Mates Signature Date/Time: 07/06/2023/3:06:06 PM    Final   MONITORS  LONG TERM MONITOR (3-14 DAYS) 04/23/2022  Narrative Patch Wear Time:  13 days and 22 hours (2023-08-02T11:41:54-0400 to 2023-08-16T10:11:23-0400)  Patient had a min HR of 52 bpm, max HR of 122 bpm, and avg HR of 68 bpm. Predominant underlying rhythm was Sinus Rhythm.  There were no sinus pauses of 3 seconds or greater and no episodes of second or third-degree AV nodal block.  There were no triggered or diary events.  4 Supraventricular Tachycardia runs occurred, the run with the fastest interval lasting 4 beats with a max rate of 122 bpm, the longest lasting 6 beats with an avg rate of 94 bpm. Isolated SVEs were rare (<1.0%), SVE Couplets were rare (<1.0%), and SVE Triplets were rare (<1.0%).  Isolated VEs were rare (<1.0%), and no VE Couplets or VE Triplets were present. Ventricular Trigeminy was present.   CT SCANS  CT CORONARY MORPH W/CTA COR W/SCORE 04/23/2023  Addendum 05/09/2023 10:39 AM ADDENDUM REPORT: 05/09/2023 10:37  EXAM: OVER-READ INTERPRETATION  CT CHEST  The following report is an over-read performed by radiologist Dr.  [facility], [name] on 05/09/2023. This over-read does not include interpretation of cardiac or coronary anatomy or pathology. The coronary CTA interpretation by the cardiologist is attached.  COMPARISON:  None.  FINDINGS: Cardiovascular:  See findings discussed in the body of the report.  Mediastinum/Nodes: No suspicious adenopathy identified. Imaged mediastinal structures are unremarkable.  Lungs/Pleura: Imaged lungs are clear. No pleural effusion  or pneumothorax.  Upper Abdomen: No acute abnormality.  Musculoskeletal: No chest wall abnormality. There are thoracic degenerative changes.  IMPRESSION: No significant extracardiac incidental findings identified.   Electronically Signed By: Layla Maw M.D. On: 05/09/2023 10:37  Narrative CLINICAL DATA:  Chest pain  EXAM: Cardiac/Coronary CTA  TECHNIQUE: A non-contrast, gated CT scan was obtained with axial slices of 3 mm through the heart for calcium scoring. Calcium scoring was performed using the Agatston method. A 120 kV prospective, gated, contrast cardiac scan was obtained. Gantry rotation speed was 250 msecs and collimation was 0.6 mm. Two sublingual nitroglycerin tablets (0.8 mg) were given. The 3D data set was reconstructed in 5% intervals of the 35-75% of the R-R cycle. Diastolic phases were analyzed on a dedicated workstation using MPR, MIP, and VRT modes. The patient received 95 cc of contrast.  FINDINGS: Image quality: Fair. Significant blooming artifact, respiratory motion.  Noise artifact is: Mild  Coronary Arteries:  Normal coronary origin.  Right dominance.  Left main: The left main is a large caliber vessel with a normal take off from the left coronary cusp that bifurcates to form a left anterior descending artery and a left circumflex artery. There is mild calcified plaque in the distal LM with associated stenosis of 25-49%.  Left anterior descending artery: The LAD gives off 2 patent diagonal branches. There is diffuse mild to moderate calcified plaque in the proximal and mid LAD with associated stenosis of 25-49% but at some places could be > 50%. This may be overestimated in the setting of blooming artifact.  Left circumflex artery: The LCX is non-dominant and gives off 1 patent obtuse marginal branch. There is diffuse calcified plaque in the ostial, proximal and mid LCx with associated stenosis of 25-49% with a possible moderate to  severe focal stenosis of at least 50-69% and possibly >70% in the mid LCx.  Right coronary artery: The RCA is dominant with normal take off from the right coronary cusp. The RCA terminates as a PDA and right posterolateral branch without evidence of plaque or stenosis. There is mild scattered calcified plaque in the throughout RCA with associated stenosis of 25-49%. There is moderate calcified plaque in the mid RCA with associated stenosis of 50-69%.  Right Atrium: Right atrial size is within normal limits.  Right Ventricle: The right ventricular cavity is within normal limits.  Left Atrium: Left atrial size is normal in size with no left atrial appendage filling defect.  Left Ventricle: The ventricular cavity size is within normal limits.  Pulmonary arteries: Normal in size.  Pulmonary veins: Normal pulmonary venous drainage.  Pericardium: Normal thickness without significant effusion or calcium present.  Cardiac valves: The aortic valve is trileaflet without significant calcification. The mitral valve is normal without significant calcification.  Aorta: Normal caliber with scattered calcifications.  Extra-cardiac findings: See attached radiology report for non-cardiac structures.  IMPRESSION: 1. Coronary calcium score of 812. This was 60th percentile for age-, sex, and race-matched controls.  2. Total plaque volume 663 mm3 which is 40th percentile for age- and sex-matched controls (calcified plaque 88 mm3; non-calcified plaque 575  mm3). TPV is severe.  3. Normal coronary origin with right dominance.  4. Mild to moderate atherosclerosis. 25-49% distal LM/proximal to mid LAD. 25-49% ostial/proximal/mid LCx and diffusely throughout the RCA. Possible calcified plaque >50% in the mid RCA/LCx and LAD.  5. Recommend preventive therapy and risk factor modification.  6. This study has been submitted for FFR analysis.  RECOMMENDATIONS: 1. CAD-RADS 0: No evidence of CAD  (0%). Consider non-atherosclerotic causes of chest pain.  2. CAD-RADS 1: Minimal non-obstructive CAD (0-24%). Consider non-atherosclerotic causes of chest pain. Consider preventive therapy and risk factor modification.  3. CAD-RADS 2: Mild non-obstructive CAD (25-49%). Consider non-atherosclerotic causes of chest pain. Consider preventive therapy and risk factor modification.  4. CAD-RADS 3: Moderate stenosis. Consider symptom-guided anti-ischemic pharmacotherapy as well as risk factor modification per guideline directed care. Additional analysis with CT FFR will be submitted.  5. CAD-RADS 4: Severe stenosis. (70-99% or > 50% left main). Cardiac catheterization or CT FFR is recommended. Consider symptom-guided anti-ischemic pharmacotherapy as well as risk factor modification per guideline directed care. Invasive coronary angiography recommended with revascularization per published guideline statements.  6. CAD-RADS 5: Total coronary occlusion (100%). Consider cardiac catheterization or viability assessment. Consider symptom-guided anti-ischemic pharmacotherapy as well as risk factor modification per guideline directed care.  7. CAD-RADS N: Non-diagnostic study. Obstructive CAD can't be excluded. Alternative evaluation is recommended.  Armanda Magic, MD  Electronically Signed: By: Armanda Magic M.D. On: 04/30/2023 19:47   CARDIAC MRI  MR CARDIAC MORPHOLOGY W WO CONTRAST 03/21/2022  Narrative CLINICAL DATA:  Evaluate aortic regurgitation  EXAM: CARDIAC MRI  TECHNIQUE: The patient was scanned on a 1.5 Tesla Siemens magnet. A dedicated cardiac coil was used. Functional imaging was done using Fiesta sequences. 2,3, and 4 chamber views were done to assess for RWMA's. Modified Simpson's rule using a short axis stack was used to calculate an ejection fraction on a dedicated work [employer]. The patient received 10 cc of Gadavist. After 10 minutes  inversion recovery sequences were used to assess for infiltration and scar tissue. Phase contrast velocity mapping performed.  CONTRAST:  10 cc  of Gadavist  FINDINGS: 1. Normal left ventricular size, thickness and systolic function (LVEF = 54%). There are no regional wall motion abnormalities.  There is no late gadolinium enhancement in the left ventricular myocardium.  LVEDV: 123 ml  LVESV: 57 ml  SV: 66 ml  Myocardial mass: 86 g  2. Normal right ventricular size, thickness and systolic function (RVEF = 63%). There are no regional wall motion abnormalities.  3.  Normal left and right atrial size.  4. Normal size of the aortic root, ascending aorta and pulmonary artery.  5. Moderate aortic regurgitation. Aortic valve measurements: regurgitant fraction = 37%, regurgitant volume = 24 ml  Mild tricuspid regurgitation.  6.  Normal pericardium.  No pericardial effusion.  IMPRESSION: 1.  Normal Biventricular size and function.  LVEF 54%, RVEF 63%.  2.  Moderate aortic valve regurgitation.  Regurgitant fraction 37%.  3.  No evidence of late gadolinium enhancement or infiltration.  4.  Mild tricuspid regurgitation.  Debbe Odea   Electronically Signed By: Debbe Odea M.D. On: 03/26/2022 17:40         Risk Assessment/Calculations:    CHA2DS2-VASc Score = 7   This indicates a 11.2% annual risk of stroke. The patient's score is based upon: CHF History: 0 HTN History: 1 Diabetes History: 1 Stroke History: 2 Vascular Disease History: 1 Age Score: 2 Gender  Score: 0    HYPERTENSION CONTROL Vitals:   07/29/23 1445 07/29/23 1702  BP: (!) 182/93 (!) 182/93    The patient's blood pressure is elevated above target today.  In order to address the patient's elevated BP: A new medication was prescribed today.          Physical Exam:   VS:  BP (!) 182/93   Pulse 81   Ht 5\' 7"  (1.702 m)   Wt 143 lb (64.9 kg)   SpO2 99%   BMI 22.40 kg/m    Wt  Readings from Last 3 Encounters:  07/29/23 143 lb (64.9 kg)  07/20/23 139 lb 15.9 oz (63.5 kg)  07/03/23 146 lb 4.8 oz (66.4 kg)    GEN: Well nourished, well developed in no acute distress NECK: No JVD; No carotid bruits CARDIAC: RRR, no murmurs, rubs, gallops RESPIRATORY:  diminished but clear ABDOMEN: Soft, non-tender, non-distended EXTREMITIES:  No edema; No deformity  SKIN: Sternotomy site healing well, no residual sutures.  PPM implantation site with intact bandage, skin surrounding without erythema or tenderness.  ASSESSMENT AND PLAN: .   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 Stable with no anginal symptoms. No indication for ischemic evaluation.  Continue aspirin 81 mg daily, continue metoprolol 100 mg daily.  AVR-  was to undergo TAVR, however underwent AVR with 21 mm Inspiris aortic valve, repeat echo arrange for 08/16/2023.   PAF/hypercoagulable state -maintaining sinus rhythm, CHA2DS2-VASc score of 7, continue Eliquis 5 mg twice daily--no indication for dose reduction--denies hematuria, hemoptysis, hematochezia, continue metoprolol 100 mg daily.  CHB s/p PPM - dressing intact, will see EP on 08/01/2023  CVA - neurologically intact, will see Dr. Pearlean Brownie in February  Hypertension-blood pressure elevated 182/93, his antihypertensive agents were stopped in the hospital in the setting of hypotension, we will restart his Norvasc 5 mg daily.  Pedal edema - will restart his lasix 20 mg daily x 3 days, then PRN for wt gain of 2 lbs/1 day or pedal edema, start potassium 20 mEq to take on the days he takes lasix. Repeat CMET, proBNP today.     Dispo: Start amlodipine 5 mg daily.  Weigh daily at facility. Start lasix 20 mg daily x 3 days, then PRN for wt gain of 2 lbs/day or worsening pedal edema, start potassium 20 mEq to be taken with lasix. CMET, CBC, ProBNP today. Follow up with general cardiology in 6-8 weeks.   Signed, Flossie Dibble, NP

## 2023-07-29 ENCOUNTER — Ambulatory Visit: Payer: Medicare Other | Attending: Cardiology | Admitting: Cardiology

## 2023-07-29 ENCOUNTER — Encounter: Payer: Self-pay | Admitting: Cardiology

## 2023-07-29 VITALS — BP 182/93 | HR 81 | Ht 67.0 in | Wt 143.0 lb

## 2023-07-29 DIAGNOSIS — I480 Paroxysmal atrial fibrillation: Secondary | ICD-10-CM

## 2023-07-29 DIAGNOSIS — I2510 Atherosclerotic heart disease of native coronary artery without angina pectoris: Secondary | ICD-10-CM

## 2023-07-29 DIAGNOSIS — I10 Essential (primary) hypertension: Secondary | ICD-10-CM | POA: Diagnosis not present

## 2023-07-29 DIAGNOSIS — Z952 Presence of prosthetic heart valve: Secondary | ICD-10-CM | POA: Diagnosis not present

## 2023-07-29 DIAGNOSIS — D6859 Other primary thrombophilia: Secondary | ICD-10-CM

## 2023-07-29 DIAGNOSIS — Z951 Presence of aortocoronary bypass graft: Secondary | ICD-10-CM | POA: Diagnosis not present

## 2023-07-29 DIAGNOSIS — R0602 Shortness of breath: Secondary | ICD-10-CM | POA: Diagnosis not present

## 2023-07-29 MED ORDER — POTASSIUM CHLORIDE CRYS ER 20 MEQ PO TBCR: 20.0000 meq | EXTENDED_RELEASE_TABLET | ORAL | 3 refills | Status: DC | PRN

## 2023-07-29 MED ORDER — AMLODIPINE BESYLATE 5 MG PO TABS: 5.0000 mg | ORAL_TABLET | Freq: Every day | ORAL | 3 refills | Status: DC

## 2023-07-29 MED ORDER — FUROSEMIDE 20 MG PO TABS: 20.0000 mg | ORAL_TABLET | ORAL | 3 refills | Status: DC | PRN

## 2023-07-29 NOTE — Patient Instructions (Signed)
Medication Instructions:   Your physician has recommended you make the following change in your medication:   Start taking Amlodipine 5 mg every day.  For the next three days, take Lasix 20 mg once a day. After the third day, take Lasix 20 mg as needed for weight gain of 2 pounds or more in a day OR pedal edema.   For the next three days, take Potassium 20 meq once a day. After the third day, take Potassium 20 meq when you take a dose of Lasix 20 mg.   Please ensure you are weighing daily.   *If you need a refill on your cardiac medications before your next appointment, please call your pharmacy*   Lab Work:   Today you will have a ProBnp, CBC and CMP done.   If you have labs (blood work) drawn today and your tests are completely normal, you will receive your results only by: MyChart Message (if you have MyChart) OR A paper copy in the mail If you have any lab test that is abnormal or we need to change your treatment, we will call you to review the results.   Follow-Up: At [facility], you and your health needs are our priority.  As part of our continuing mission to provide you with exceptional heart care, we have created designated Provider Care Teams.  These Care Teams include your primary Cardiologist (physician) and Advanced Practice Providers (APPs -  Physician Assistants and Nurse Practitioners) who all work together to provide you with the care you need, when you need it.  We recommend signing up for the patient portal called "MyChart".  Sign up information is provided on this After Visit Summary.  MyChart is used to connect with patients for Virtual Visits (Telemedicine).  Patients are able to view lab/test results, encounter notes, upcoming appointments, etc.  Non-urgent messages can be sent to your provider as well.   To learn more about what you can do with MyChart, go to [email or web address].    Your next appointment:   6-8 week(s)  Provider:   Norman Herrlich, MD     Please ensure you are weighing daily.

## 2023-07-30 ENCOUNTER — Other Ambulatory Visit: Payer: Self-pay

## 2023-07-30 ENCOUNTER — Other Ambulatory Visit: Payer: Self-pay | Admitting: Thoracic Surgery (Cardiothoracic Vascular Surgery)

## 2023-07-30 ENCOUNTER — Telehealth: Payer: Self-pay | Admitting: Cardiology

## 2023-07-30 DIAGNOSIS — R0602 Shortness of breath: Secondary | ICD-10-CM

## 2023-07-30 DIAGNOSIS — I351 Nonrheumatic aortic (valve) insufficiency: Secondary | ICD-10-CM

## 2023-07-30 LAB — COMPREHENSIVE METABOLIC PANEL WITH GFR
ALT: 15 IU/L (ref 0–44)
AST: 27 IU/L (ref 0–40)
Albumin: 3.9 g/dL (ref 3.7–4.7)
Alkaline Phosphatase: 104 IU/L (ref 44–121)
BUN/Creatinine Ratio: 17 (ref 10–24)
BUN: 13 mg/dL (ref 8–27)
Bilirubin Total: 0.8 mg/dL (ref 0.0–1.2)
CO2: 23 mmol/L (ref 20–29)
Calcium: 9.5 mg/dL (ref 8.6–10.2)
Chloride: 104 mmol/L (ref 96–106)
Creatinine, Ser: 0.78 mg/dL (ref 0.76–1.27)
Globulin, Total: 2.3 g/dL (ref 1.5–4.5)
Glucose: 79 mg/dL (ref 70–99)
Potassium: 4.3 mmol/L (ref 3.5–5.2)
Sodium: 142 mmol/L (ref 134–144)
Total Protein: 6.2 g/dL (ref 6.0–8.5)
eGFR: 88 mL/min/1.73

## 2023-07-30 LAB — CBC
Hematocrit: 33.6 % — ABNORMAL LOW (ref 37.5–51.0)
Hemoglobin: 10.7 g/dL — ABNORMAL LOW (ref 13.0–17.7)
MCH: 26.9 pg (ref 26.6–33.0)
MCHC: 31.8 g/dL (ref 31.5–35.7)
MCV: 84 fL (ref 79–97)
Platelets: 286 x10E3/uL (ref 150–450)
RBC: 3.98 x10E6/uL — ABNORMAL LOW (ref 4.14–5.80)
RDW: 13.2 % (ref 11.6–15.4)
WBC: 8.1 x10E3/uL (ref 3.4–10.8)

## 2023-07-30 LAB — PRO B NATRIURETIC PEPTIDE: NT-Pro BNP: 917 pg/mL — ABNORMAL HIGH (ref 0–486)

## 2023-07-30 NOTE — Telephone Encounter (Signed)
Patient's wife informed of results. 

## 2023-07-30 NOTE — Telephone Encounter (Signed)
Patient's wife is returning call to discuss lab results. 

## 2023-07-31 ENCOUNTER — Ambulatory Visit
Admission: RE | Admit: 2023-07-31 | Discharge: 2023-07-31 | Disposition: A | Discharge status: Home / Self Care | Payer: Medicare Other | Source: Ambulatory Visit | Attending: Thoracic Surgery (Cardiothoracic Vascular Surgery) | Admitting: Thoracic Surgery (Cardiothoracic Vascular Surgery)

## 2023-07-31 ENCOUNTER — Ambulatory Visit (INDEPENDENT_AMBULATORY_CARE_PROVIDER_SITE_OTHER): Payer: Self-pay | Admitting: Physician Assistant

## 2023-07-31 VITALS — BP 162/89 | HR 88 | Resp 20 | Ht 67.0 in | Wt 139.0 lb

## 2023-07-31 DIAGNOSIS — Z951 Presence of aortocoronary bypass graft: Secondary | ICD-10-CM

## 2023-07-31 DIAGNOSIS — I351 Nonrheumatic aortic (valve) insufficiency: Secondary | ICD-10-CM

## 2023-07-31 DIAGNOSIS — Z9889 Other specified postprocedural states: Secondary | ICD-10-CM | POA: Diagnosis not present

## 2023-07-31 DIAGNOSIS — Z952 Presence of prosthetic heart valve: Secondary | ICD-10-CM

## 2023-07-31 MED ORDER — NYSTATIN 100000 UNIT/ML MT SUSP: 5.0000 mL | Freq: Four times a day (QID) | OROMUCOSAL | Status: DC

## 2023-08-01 ENCOUNTER — Ambulatory Visit: Payer: Medicare Other | Attending: Cardiovascular Disease

## 2023-08-01 DIAGNOSIS — Z951 Presence of aortocoronary bypass graft: Secondary | ICD-10-CM | POA: Diagnosis not present

## 2023-08-01 DIAGNOSIS — I442 Atrioventricular block, complete: Secondary | ICD-10-CM

## 2023-08-01 DIAGNOSIS — I2510 Atherosclerotic heart disease of native coronary artery without angina pectoris: Secondary | ICD-10-CM | POA: Diagnosis not present

## 2023-08-01 DIAGNOSIS — R5381 Other malaise: Secondary | ICD-10-CM | POA: Diagnosis not present

## 2023-08-01 NOTE — Progress Notes (Signed)
Wound check appointment. Steri-strips removed. Wound without redness or edema. Incision edges approximated, wound well healed. Normal device function. Thresholds, sensing, and impedances consistent with implant measurements. Device programmed at 3.5V/auto capture programmed on for extra safety margin until 3 month visit. Histogram distribution appropriate for patient and level of activity. No mode switches or high ventricular rates noted. Patient educated about wound care, arm mobility, lifting restrictions. ROV in 3 months with implanting physician.  Patient and his spouse were educated on how to send a manual transmission when they returned home today, because they had not sent the initial one after device placement since [employer] does not automatically begin sending them.

## 2023-08-01 NOTE — Patient Instructions (Signed)

## 2023-08-05 DIAGNOSIS — J45909 Unspecified asthma, uncomplicated: Secondary | ICD-10-CM | POA: Diagnosis not present

## 2023-08-05 DIAGNOSIS — F329 Major depressive disorder, single episode, unspecified: Secondary | ICD-10-CM | POA: Diagnosis not present

## 2023-08-05 DIAGNOSIS — Z8546 Personal history of malignant neoplasm of prostate: Secondary | ICD-10-CM | POA: Diagnosis not present

## 2023-08-05 DIAGNOSIS — I2510 Atherosclerotic heart disease of native coronary artery without angina pectoris: Secondary | ICD-10-CM | POA: Diagnosis not present

## 2023-08-05 DIAGNOSIS — Z7902 Long term (current) use of antithrombotics/antiplatelets: Secondary | ICD-10-CM | POA: Diagnosis not present

## 2023-08-05 DIAGNOSIS — Z7951 Long term (current) use of inhaled steroids: Secondary | ICD-10-CM | POA: Diagnosis not present

## 2023-08-05 DIAGNOSIS — Z85038 Personal history of other malignant neoplasm of large intestine: Secondary | ICD-10-CM | POA: Diagnosis not present

## 2023-08-05 DIAGNOSIS — Z7984 Long term (current) use of oral hypoglycemic drugs: Secondary | ICD-10-CM | POA: Diagnosis not present

## 2023-08-05 DIAGNOSIS — Z48812 Encounter for surgical aftercare following surgery on the circulatory system: Secondary | ICD-10-CM | POA: Diagnosis not present

## 2023-08-05 DIAGNOSIS — Z7952 Long term (current) use of systemic steroids: Secondary | ICD-10-CM | POA: Diagnosis not present

## 2023-08-05 DIAGNOSIS — N200 Calculus of kidney: Secondary | ICD-10-CM | POA: Diagnosis not present

## 2023-08-05 DIAGNOSIS — Z951 Presence of aortocoronary bypass graft: Secondary | ICD-10-CM | POA: Diagnosis not present

## 2023-08-05 DIAGNOSIS — Z85828 Personal history of other malignant neoplasm of skin: Secondary | ICD-10-CM | POA: Diagnosis not present

## 2023-08-05 DIAGNOSIS — Z950 Presence of cardiac pacemaker: Secondary | ICD-10-CM | POA: Diagnosis not present

## 2023-08-05 DIAGNOSIS — D649 Anemia, unspecified: Secondary | ICD-10-CM | POA: Diagnosis not present

## 2023-08-05 DIAGNOSIS — I10 Essential (primary) hypertension: Secondary | ICD-10-CM | POA: Diagnosis not present

## 2023-08-05 DIAGNOSIS — Z8673 Personal history of transient ischemic attack (TIA), and cerebral infarction without residual deficits: Secondary | ICD-10-CM | POA: Diagnosis not present

## 2023-08-05 DIAGNOSIS — I351 Nonrheumatic aortic (valve) insufficiency: Secondary | ICD-10-CM | POA: Diagnosis not present

## 2023-08-06 DIAGNOSIS — I10 Essential (primary) hypertension: Secondary | ICD-10-CM | POA: Diagnosis not present

## 2023-08-06 DIAGNOSIS — N200 Calculus of kidney: Secondary | ICD-10-CM | POA: Diagnosis not present

## 2023-08-06 DIAGNOSIS — Z7984 Long term (current) use of oral hypoglycemic drugs: Secondary | ICD-10-CM | POA: Diagnosis not present

## 2023-08-06 DIAGNOSIS — Z951 Presence of aortocoronary bypass graft: Secondary | ICD-10-CM | POA: Diagnosis not present

## 2023-08-06 DIAGNOSIS — I351 Nonrheumatic aortic (valve) insufficiency: Secondary | ICD-10-CM | POA: Diagnosis not present

## 2023-08-06 DIAGNOSIS — Z8673 Personal history of transient ischemic attack (TIA), and cerebral infarction without residual deficits: Secondary | ICD-10-CM | POA: Diagnosis not present

## 2023-08-06 DIAGNOSIS — I2510 Atherosclerotic heart disease of native coronary artery without angina pectoris: Secondary | ICD-10-CM | POA: Diagnosis not present

## 2023-08-06 DIAGNOSIS — Z950 Presence of cardiac pacemaker: Secondary | ICD-10-CM | POA: Diagnosis not present

## 2023-08-06 DIAGNOSIS — D649 Anemia, unspecified: Secondary | ICD-10-CM | POA: Diagnosis not present

## 2023-08-06 DIAGNOSIS — F329 Major depressive disorder, single episode, unspecified: Secondary | ICD-10-CM | POA: Diagnosis not present

## 2023-08-06 DIAGNOSIS — Z7952 Long term (current) use of systemic steroids: Secondary | ICD-10-CM | POA: Diagnosis not present

## 2023-08-06 DIAGNOSIS — Z7902 Long term (current) use of antithrombotics/antiplatelets: Secondary | ICD-10-CM | POA: Diagnosis not present

## 2023-08-06 DIAGNOSIS — Z7951 Long term (current) use of inhaled steroids: Secondary | ICD-10-CM | POA: Diagnosis not present

## 2023-08-06 DIAGNOSIS — Z48812 Encounter for surgical aftercare following surgery on the circulatory system: Secondary | ICD-10-CM | POA: Diagnosis not present

## 2023-08-06 DIAGNOSIS — J45909 Unspecified asthma, uncomplicated: Secondary | ICD-10-CM | POA: Diagnosis not present

## 2023-08-06 DIAGNOSIS — Z85828 Personal history of other malignant neoplasm of skin: Secondary | ICD-10-CM | POA: Diagnosis not present

## 2023-08-07 DIAGNOSIS — J45909 Unspecified asthma, uncomplicated: Secondary | ICD-10-CM | POA: Diagnosis not present

## 2023-08-07 DIAGNOSIS — N200 Calculus of kidney: Secondary | ICD-10-CM | POA: Diagnosis not present

## 2023-08-07 DIAGNOSIS — Z7952 Long term (current) use of systemic steroids: Secondary | ICD-10-CM | POA: Diagnosis not present

## 2023-08-07 DIAGNOSIS — Z8673 Personal history of transient ischemic attack (TIA), and cerebral infarction without residual deficits: Secondary | ICD-10-CM | POA: Diagnosis not present

## 2023-08-07 DIAGNOSIS — Z950 Presence of cardiac pacemaker: Secondary | ICD-10-CM | POA: Diagnosis not present

## 2023-08-07 DIAGNOSIS — I2510 Atherosclerotic heart disease of native coronary artery without angina pectoris: Secondary | ICD-10-CM | POA: Diagnosis not present

## 2023-08-07 DIAGNOSIS — D649 Anemia, unspecified: Secondary | ICD-10-CM | POA: Diagnosis not present

## 2023-08-07 DIAGNOSIS — Z7902 Long term (current) use of antithrombotics/antiplatelets: Secondary | ICD-10-CM | POA: Diagnosis not present

## 2023-08-07 DIAGNOSIS — Z48812 Encounter for surgical aftercare following surgery on the circulatory system: Secondary | ICD-10-CM | POA: Diagnosis not present

## 2023-08-07 DIAGNOSIS — Z7951 Long term (current) use of inhaled steroids: Secondary | ICD-10-CM | POA: Diagnosis not present

## 2023-08-07 DIAGNOSIS — F329 Major depressive disorder, single episode, unspecified: Secondary | ICD-10-CM | POA: Diagnosis not present

## 2023-08-07 DIAGNOSIS — I351 Nonrheumatic aortic (valve) insufficiency: Secondary | ICD-10-CM | POA: Diagnosis not present

## 2023-08-07 DIAGNOSIS — Z7984 Long term (current) use of oral hypoglycemic drugs: Secondary | ICD-10-CM | POA: Diagnosis not present

## 2023-08-07 DIAGNOSIS — Z85828 Personal history of other malignant neoplasm of skin: Secondary | ICD-10-CM | POA: Diagnosis not present

## 2023-08-07 DIAGNOSIS — I10 Essential (primary) hypertension: Secondary | ICD-10-CM | POA: Diagnosis not present

## 2023-08-07 DIAGNOSIS — Z951 Presence of aortocoronary bypass graft: Secondary | ICD-10-CM | POA: Diagnosis not present

## 2023-08-09 DIAGNOSIS — I4510 Unspecified right bundle-branch block: Secondary | ICD-10-CM | POA: Diagnosis not present

## 2023-08-09 DIAGNOSIS — I4891 Unspecified atrial fibrillation: Secondary | ICD-10-CM | POA: Diagnosis not present

## 2023-08-09 DIAGNOSIS — I352 Nonrheumatic aortic (valve) stenosis with insufficiency: Secondary | ICD-10-CM | POA: Diagnosis not present

## 2023-08-09 DIAGNOSIS — I951 Orthostatic hypotension: Secondary | ICD-10-CM | POA: Diagnosis not present

## 2023-08-09 DIAGNOSIS — E785 Hyperlipidemia, unspecified: Secondary | ICD-10-CM | POA: Diagnosis not present

## 2023-08-09 DIAGNOSIS — E119 Type 2 diabetes mellitus without complications: Secondary | ICD-10-CM | POA: Diagnosis not present

## 2023-08-09 DIAGNOSIS — W19XXXA Unspecified fall, initial encounter: Secondary | ICD-10-CM | POA: Diagnosis not present

## 2023-08-09 DIAGNOSIS — K219 Gastro-esophageal reflux disease without esophagitis: Secondary | ICD-10-CM | POA: Diagnosis not present

## 2023-08-09 DIAGNOSIS — R531 Weakness: Secondary | ICD-10-CM | POA: Diagnosis not present

## 2023-08-09 DIAGNOSIS — I10 Essential (primary) hypertension: Secondary | ICD-10-CM | POA: Diagnosis not present

## 2023-08-09 DIAGNOSIS — N400 Enlarged prostate without lower urinary tract symptoms: Secondary | ICD-10-CM | POA: Diagnosis not present

## 2023-08-09 DIAGNOSIS — R55 Syncope and collapse: Secondary | ICD-10-CM | POA: Diagnosis not present

## 2023-08-09 DIAGNOSIS — Z7984 Long term (current) use of oral hypoglycemic drugs: Secondary | ICD-10-CM | POA: Diagnosis not present

## 2023-08-09 DIAGNOSIS — Z8673 Personal history of transient ischemic attack (TIA), and cerebral infarction without residual deficits: Secondary | ICD-10-CM | POA: Diagnosis not present

## 2023-08-09 DIAGNOSIS — E039 Hypothyroidism, unspecified: Secondary | ICD-10-CM | POA: Diagnosis not present

## 2023-08-09 DIAGNOSIS — Z951 Presence of aortocoronary bypass graft: Secondary | ICD-10-CM | POA: Diagnosis not present

## 2023-08-09 DIAGNOSIS — I2510 Atherosclerotic heart disease of native coronary artery without angina pectoris: Secondary | ICD-10-CM | POA: Diagnosis not present

## 2023-08-09 DIAGNOSIS — I342 Nonrheumatic mitral (valve) stenosis: Secondary | ICD-10-CM | POA: Diagnosis not present

## 2023-08-09 DIAGNOSIS — Z7901 Long term (current) use of anticoagulants: Secondary | ICD-10-CM | POA: Diagnosis not present

## 2023-08-09 DIAGNOSIS — E871 Hypo-osmolality and hyponatremia: Secondary | ICD-10-CM | POA: Diagnosis not present

## 2023-08-09 DIAGNOSIS — Z79899 Other long term (current) drug therapy: Secondary | ICD-10-CM | POA: Diagnosis not present

## 2023-08-10 DIAGNOSIS — I10 Essential (primary) hypertension: Secondary | ICD-10-CM | POA: Diagnosis not present

## 2023-08-10 DIAGNOSIS — I4891 Unspecified atrial fibrillation: Secondary | ICD-10-CM | POA: Diagnosis not present

## 2023-08-10 DIAGNOSIS — I342 Nonrheumatic mitral (valve) stenosis: Secondary | ICD-10-CM | POA: Diagnosis not present

## 2023-08-10 DIAGNOSIS — R55 Syncope and collapse: Secondary | ICD-10-CM | POA: Diagnosis not present

## 2023-08-10 DIAGNOSIS — I951 Orthostatic hypotension: Secondary | ICD-10-CM | POA: Diagnosis not present

## 2023-08-10 DIAGNOSIS — E871 Hypo-osmolality and hyponatremia: Secondary | ICD-10-CM | POA: Diagnosis not present

## 2023-08-13 ENCOUNTER — Telehealth: Payer: Self-pay | Admitting: Cardiology

## 2023-08-13 NOTE — Telephone Encounter (Signed)
Pt spouse just called to inform Dr. Dulce Sellar that pt was in [facility] this past weekend and had repeat lab work and echo so they cancelled the echo that was scheduled at the office 12/20.

## 2023-08-14 DIAGNOSIS — Z951 Presence of aortocoronary bypass graft: Secondary | ICD-10-CM | POA: Diagnosis not present

## 2023-08-14 DIAGNOSIS — Z950 Presence of cardiac pacemaker: Secondary | ICD-10-CM | POA: Diagnosis not present

## 2023-08-14 DIAGNOSIS — I10 Essential (primary) hypertension: Secondary | ICD-10-CM | POA: Diagnosis not present

## 2023-08-14 DIAGNOSIS — J45909 Unspecified asthma, uncomplicated: Secondary | ICD-10-CM | POA: Diagnosis not present

## 2023-08-14 DIAGNOSIS — I951 Orthostatic hypotension: Secondary | ICD-10-CM | POA: Diagnosis not present

## 2023-08-14 DIAGNOSIS — I2510 Atherosclerotic heart disease of native coronary artery without angina pectoris: Secondary | ICD-10-CM | POA: Diagnosis not present

## 2023-08-14 DIAGNOSIS — Z48812 Encounter for surgical aftercare following surgery on the circulatory system: Secondary | ICD-10-CM | POA: Diagnosis not present

## 2023-08-14 DIAGNOSIS — Z7952 Long term (current) use of systemic steroids: Secondary | ICD-10-CM | POA: Diagnosis not present

## 2023-08-14 DIAGNOSIS — R55 Syncope and collapse: Secondary | ICD-10-CM | POA: Diagnosis not present

## 2023-08-14 DIAGNOSIS — Z7984 Long term (current) use of oral hypoglycemic drugs: Secondary | ICD-10-CM | POA: Diagnosis not present

## 2023-08-14 DIAGNOSIS — Z8673 Personal history of transient ischemic attack (TIA), and cerebral infarction without residual deficits: Secondary | ICD-10-CM | POA: Diagnosis not present

## 2023-08-14 DIAGNOSIS — D649 Anemia, unspecified: Secondary | ICD-10-CM | POA: Diagnosis not present

## 2023-08-14 DIAGNOSIS — Z6823 Body mass index (BMI) 23.0-23.9, adult: Secondary | ICD-10-CM | POA: Diagnosis not present

## 2023-08-14 DIAGNOSIS — I351 Nonrheumatic aortic (valve) insufficiency: Secondary | ICD-10-CM | POA: Diagnosis not present

## 2023-08-14 DIAGNOSIS — Z79899 Other long term (current) drug therapy: Secondary | ICD-10-CM | POA: Diagnosis not present

## 2023-08-14 DIAGNOSIS — Z7951 Long term (current) use of inhaled steroids: Secondary | ICD-10-CM | POA: Diagnosis not present

## 2023-08-14 DIAGNOSIS — Z7902 Long term (current) use of antithrombotics/antiplatelets: Secondary | ICD-10-CM | POA: Diagnosis not present

## 2023-08-14 DIAGNOSIS — Z85828 Personal history of other malignant neoplasm of skin: Secondary | ICD-10-CM | POA: Diagnosis not present

## 2023-08-14 DIAGNOSIS — F329 Major depressive disorder, single episode, unspecified: Secondary | ICD-10-CM | POA: Diagnosis not present

## 2023-08-14 DIAGNOSIS — N200 Calculus of kidney: Secondary | ICD-10-CM | POA: Diagnosis not present

## 2023-08-16 ENCOUNTER — Encounter (HOSPITAL_COMMUNITY): Payer: Self-pay

## 2023-08-16 ENCOUNTER — Ambulatory Visit: Payer: Medicare Other

## 2023-08-16 DIAGNOSIS — F329 Major depressive disorder, single episode, unspecified: Secondary | ICD-10-CM | POA: Diagnosis not present

## 2023-08-16 DIAGNOSIS — Z85828 Personal history of other malignant neoplasm of skin: Secondary | ICD-10-CM | POA: Diagnosis not present

## 2023-08-16 DIAGNOSIS — N200 Calculus of kidney: Secondary | ICD-10-CM | POA: Diagnosis not present

## 2023-08-16 DIAGNOSIS — R079 Chest pain, unspecified: Secondary | ICD-10-CM | POA: Diagnosis not present

## 2023-08-16 DIAGNOSIS — Z7951 Long term (current) use of inhaled steroids: Secondary | ICD-10-CM | POA: Diagnosis not present

## 2023-08-16 DIAGNOSIS — Z48812 Encounter for surgical aftercare following surgery on the circulatory system: Secondary | ICD-10-CM | POA: Diagnosis not present

## 2023-08-16 DIAGNOSIS — Z7902 Long term (current) use of antithrombotics/antiplatelets: Secondary | ICD-10-CM | POA: Diagnosis not present

## 2023-08-16 DIAGNOSIS — R0600 Dyspnea, unspecified: Secondary | ICD-10-CM | POA: Diagnosis not present

## 2023-08-16 DIAGNOSIS — R9431 Abnormal electrocardiogram [ECG] [EKG]: Secondary | ICD-10-CM | POA: Diagnosis not present

## 2023-08-16 DIAGNOSIS — Z7952 Long term (current) use of systemic steroids: Secondary | ICD-10-CM | POA: Diagnosis not present

## 2023-08-16 DIAGNOSIS — R0602 Shortness of breath: Secondary | ICD-10-CM | POA: Diagnosis not present

## 2023-08-16 DIAGNOSIS — Z7984 Long term (current) use of oral hypoglycemic drugs: Secondary | ICD-10-CM | POA: Diagnosis not present

## 2023-08-16 DIAGNOSIS — Z951 Presence of aortocoronary bypass graft: Secondary | ICD-10-CM | POA: Diagnosis not present

## 2023-08-16 DIAGNOSIS — D649 Anemia, unspecified: Secondary | ICD-10-CM | POA: Diagnosis not present

## 2023-08-16 DIAGNOSIS — I2510 Atherosclerotic heart disease of native coronary artery without angina pectoris: Secondary | ICD-10-CM | POA: Diagnosis not present

## 2023-08-16 DIAGNOSIS — Z950 Presence of cardiac pacemaker: Secondary | ICD-10-CM | POA: Diagnosis not present

## 2023-08-16 DIAGNOSIS — I10 Essential (primary) hypertension: Secondary | ICD-10-CM | POA: Diagnosis not present

## 2023-08-16 DIAGNOSIS — Z8673 Personal history of transient ischemic attack (TIA), and cerebral infarction without residual deficits: Secondary | ICD-10-CM | POA: Diagnosis not present

## 2023-08-16 DIAGNOSIS — Z953 Presence of xenogenic heart valve: Secondary | ICD-10-CM | POA: Diagnosis not present

## 2023-08-16 DIAGNOSIS — I351 Nonrheumatic aortic (valve) insufficiency: Secondary | ICD-10-CM | POA: Diagnosis not present

## 2023-08-16 DIAGNOSIS — R000 Tachycardia, unspecified: Secondary | ICD-10-CM | POA: Diagnosis not present

## 2023-08-16 DIAGNOSIS — I4510 Unspecified right bundle-branch block: Secondary | ICD-10-CM | POA: Diagnosis not present

## 2023-08-16 DIAGNOSIS — Z20822 Contact with and (suspected) exposure to covid-19: Secondary | ICD-10-CM | POA: Diagnosis not present

## 2023-08-16 DIAGNOSIS — J45909 Unspecified asthma, uncomplicated: Secondary | ICD-10-CM | POA: Diagnosis not present

## 2023-08-17 ENCOUNTER — Observation Stay (HOSPITAL_COMMUNITY)
Admission: EM | Admit: 2023-08-17 | Discharge: 2023-08-20 | Disposition: A | Discharge status: Home / Self Care | Payer: Medicare Other | Source: Other Acute Inpatient Hospital | Attending: Student in an Organized Health Care Education/Training Program | Admitting: Student in an Organized Health Care Education/Training Program

## 2023-08-17 ENCOUNTER — Encounter (HOSPITAL_COMMUNITY): Payer: Self-pay | Admitting: Internal Medicine

## 2023-08-17 DIAGNOSIS — I2510 Atherosclerotic heart disease of native coronary artery without angina pectoris: Secondary | ICD-10-CM | POA: Diagnosis not present

## 2023-08-17 DIAGNOSIS — R55 Syncope and collapse: Secondary | ICD-10-CM | POA: Diagnosis not present

## 2023-08-17 DIAGNOSIS — E119 Type 2 diabetes mellitus without complications: Secondary | ICD-10-CM | POA: Insufficient documentation

## 2023-08-17 DIAGNOSIS — Z8673 Personal history of transient ischemic attack (TIA), and cerebral infarction without residual deficits: Secondary | ICD-10-CM | POA: Diagnosis not present

## 2023-08-17 DIAGNOSIS — R000 Tachycardia, unspecified: Secondary | ICD-10-CM | POA: Diagnosis not present

## 2023-08-17 DIAGNOSIS — N400 Enlarged prostate without lower urinary tract symptoms: Secondary | ICD-10-CM | POA: Diagnosis not present

## 2023-08-17 DIAGNOSIS — Z7901 Long term (current) use of anticoagulants: Secondary | ICD-10-CM | POA: Diagnosis not present

## 2023-08-17 DIAGNOSIS — Z951 Presence of aortocoronary bypass graft: Secondary | ICD-10-CM

## 2023-08-17 DIAGNOSIS — D500 Iron deficiency anemia secondary to blood loss (chronic): Secondary | ICD-10-CM | POA: Diagnosis not present

## 2023-08-17 DIAGNOSIS — Z79899 Other long term (current) drug therapy: Secondary | ICD-10-CM | POA: Insufficient documentation

## 2023-08-17 DIAGNOSIS — I442 Atrioventricular block, complete: Secondary | ICD-10-CM | POA: Diagnosis present

## 2023-08-17 DIAGNOSIS — I480 Paroxysmal atrial fibrillation: Secondary | ICD-10-CM

## 2023-08-17 DIAGNOSIS — R296 Repeated falls: Secondary | ICD-10-CM | POA: Insufficient documentation

## 2023-08-17 DIAGNOSIS — Z85038 Personal history of other malignant neoplasm of large intestine: Secondary | ICD-10-CM | POA: Diagnosis not present

## 2023-08-17 DIAGNOSIS — J45909 Unspecified asthma, uncomplicated: Secondary | ICD-10-CM | POA: Diagnosis not present

## 2023-08-17 DIAGNOSIS — Z952 Presence of prosthetic heart valve: Secondary | ICD-10-CM

## 2023-08-17 DIAGNOSIS — I10 Essential (primary) hypertension: Secondary | ICD-10-CM | POA: Diagnosis not present

## 2023-08-17 HISTORY — DX: Syncope and collapse: R55

## 2023-08-17 MED ORDER — MOMETASONE FURO-FORMOTEROL FUM 200-5 MCG/ACT IN AERO
2.0000 | INHALATION_SPRAY | Freq: Two times a day (BID) | RESPIRATORY_TRACT | Status: DC
  Administered 2023-08-18 – 2023-08-20 (×4): 2 via RESPIRATORY_TRACT
  Filled 2023-08-17: qty 8.8

## 2023-08-17 MED ORDER — APIXABAN 5 MG PO TABS
5.0000 mg | ORAL_TABLET | Freq: Two times a day (BID) | ORAL | Status: DC
  Administered 2023-08-17 – 2023-08-20 (×6): 5 mg via ORAL
  Filled 2023-08-17 (×6): qty 1

## 2023-08-17 MED ORDER — ROSUVASTATIN CALCIUM 20 MG PO TABS
20.0000 mg | ORAL_TABLET | Freq: Every day | ORAL | Status: DC
Start: 2023-08-18 — End: 2023-08-20
  Administered 2023-08-18 – 2023-08-20 (×3): 20 mg via ORAL
  Filled 2023-08-17 (×3): qty 1

## 2023-08-17 MED ORDER — ONDANSETRON HCL 4 MG/2ML IJ SOLN
4.0000 mg | Freq: Four times a day (QID) | INTRAMUSCULAR | Status: DC | PRN
  Filled 2023-08-17: qty 2

## 2023-08-17 MED ORDER — LEVOTHYROXINE SODIUM 75 MCG PO TABS
75.0000 ug | ORAL_TABLET | Freq: Every day | ORAL | Status: DC
  Administered 2023-08-18 – 2023-08-20 (×3): 75 ug via ORAL
  Filled 2023-08-17 (×3): qty 1

## 2023-08-17 MED ORDER — PANTOPRAZOLE SODIUM 40 MG PO TBEC
40.0000 mg | DELAYED_RELEASE_TABLET | Freq: Every day | ORAL | Status: DC
Start: 2023-08-18 — End: 2023-08-20
  Administered 2023-08-18 – 2023-08-20 (×3): 40 mg via ORAL
  Filled 2023-08-17 (×3): qty 1

## 2023-08-17 MED ORDER — ACETAMINOPHEN 325 MG PO TABS
650.0000 mg | ORAL_TABLET | ORAL | Status: DC | PRN
  Administered 2023-08-19: 650 mg via ORAL
  Filled 2023-08-17: qty 2

## 2023-08-17 MED ORDER — EMPAGLIFLOZIN 10 MG PO TABS
10.0000 mg | ORAL_TABLET | Freq: Every day | ORAL | Status: DC
  Administered 2023-08-18 – 2023-08-20 (×3): 10 mg via ORAL
  Filled 2023-08-17 (×3): qty 1

## 2023-08-17 NOTE — H&P (Incomplete)
Cardiology Admission History and Physical:   Patient ID: Bruce Nunez MRN: [id number]; DOB: [birth year]   Admission date: 08/17/2023  Primary Care Provider: Gus Height, PA [facility] Cardiologist: Norman Herrlich, MD  [facility] Electrophysiologist: Loman Brooklyn, MD   Chief Complaint: recurrent falls   Patient Profile:  82M with CAD and severe AR s/p 1v CABG (LIMA-LAD)+21 mm Inspiris AVR (07/05/23, Welder) c/b post op paroxysmal AF and subsequent post op symptomatic CHB requiring LEFT Abbott DC PPM implant (07/17/23, Camnitz) who presented to [facility] for recurrent falls was transferred to [facility] for ongoing evaluation.  History of Present Illness:   Bruce Nunez presented to [facility] ED on Friday afternoon after experiencing recurrent falls at home.  On Wednesday morning he was shaving and was standing with his walker when he started experiencing lightheadedness and fell backwards hitting his right shoulder without head trauma.  No complete loss of consciousness.  He felt that his strength just gave out but he had not just recently stood up and had been standing for a while shaving.  Second episode was on Wednesday morning when he was walking on the hall and fell.  He again had no loss consciousness and was not using his walker at that time.  He fell and hit his left shoulder without head trauma.  Third fall was Friday morning when he just woken up from breakfast and was walking across the kitchen to the bathroom.  He made it to the restroom and fell on his back and did have some contact to the base of his head.  He says he can "feel it coming on" but cannot respond quick enough to prevent it from happening. He has a "head buzzing" feeling but denies tunnel vision or complete loss of consciousness during any of his episodes.  He has not had similar episodes until this week.  He denies any recent change in p.o. intake and none of the episodes were right when he stood up.  He did have a  brief episode of chest discomfort which was 5/10 severity on Friday evening when he was in bed at [facility] which she describes as central radiating shoulder pain that lasted 30 to 40 minutes was alleviated with 1 dose of morphine.  He had negative chest pain leading up to his CABG/SAVR other blockages were detected on preoperative coronary evaluation.    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 Non-smoker and no etoh intake.   During my evaluation he was resting comfortably in bed and conversant.  He had no acute complaints.  Vital signs negative for orthostatic hypotension.  VS: P 104, BP 94/53 (60) supine, O2 100% on RA Repeat VS sitting upright: P 117, BP 127/77 (92) Repeat VS standing: P 115, BP 125/66 (84)   Review of workup at [facility]:  ECG: ST 113, QRS 126, QT/Qtc 376/515, RBBB CTA (08/16/23): no embolism, dissection, or PNA, small effusions and adjacent atelectasis  CXR (08/16/23): heart size WNLs, s/p AVR and CABG, unremarkable L sided device, well inflated lungs, no consolidation, PNA or pleural effusion, no acute process.  Labs with Na 134, K 4.2, WBC 9.1, Hb 11.3, plt 205, hsT <0.01. NT pro BNP 306. Flu A/B, covid and RSV pending.   Past Medical History:  Diagnosis Date   Anemia    recent iron supplement   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   Aortic regurgitation 03/12/2017   Aortic  stenosis 04/29/2019   Asthma    childhood-   2013 bronchitis w/wheezing   Complication of anesthesia    " ether made me sick" - "woke up during colonoscopy"   Diabetes mellitus without complication (HCC)    Endotracheally intubated 07/05/2023   Essential hypertension 03/12/2017   Frequency of urination    GERD (gastroesophageal reflux disease)    occasional   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   Heart murmur    Hemorrhoids    History of kidney stones    Hx of colon cancer, stage I    Hx of nonmelanoma skin  cancer    Hx of peptic ulcer    as teenager   Hx of transfusion    Hyperlipidemia    Hypertension    Inguinal hernia    PONV (postoperative nausea and vomiting)    only when had Ether as Anesthesia drug   Prostate cancer (HCC) 08/15/2012   also Hx colon cancer / Hx skin cancer   Right inguinal hernia 05/08/2016   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   S/P right inguinal hernia repair Sept 2015 05/17/2014   Recurrent right indirect  Hernia treated with robotic TAPP repair with enterolysis Sept 2017   Stroke [facility]) 03/28/2022   Past Surgical History:  Procedure Laterality Date   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   Procedure: AORTIC VALVE REPLACEMENT WITH INSPIRIS AORTIC VALVE;  Surgeon: [name], MD;  Location: [facility] OR;  Service: Open Heart Surgery;  Laterality: N/A;   cataract surgery  2009   CORONARY ARTERY BYPASS GRAFT N/A 07/05/2023   Procedure: CORONARY ARTERY BYPASS GRAFTING, USING LEFT INTERNAL MAMMARY ARTERY;  Surgeon: [name], MD;  Location: [facility] OR;  Service: Open Heart Surgery;  Laterality: N/A;   CYSTOSCOPY     INGUINAL HERNIA REPAIR Right 05/17/2014   Procedure: RIGHT INGUINAL HERNIA REPAIR ;  Surgeon: Wenda Low, MD;  Location: [facility] ORS;  Service: General;  Laterality: Right;  With MESH   KNEE SURGERY Right 2012   LEFT HEART CATH AND CORONARY ANGIOGRAPHY N/A 05/03/2023   Procedure: LEFT HEART CATH AND CORONARY ANGIOGRAPHY;  Surgeon: Tonny Bollman, MD;  Location: [facility] INVASIVE CV LAB;  Service: Cardiovascular;  Laterality: N/A;   PACEMAKER IMPLANT N/A 07/17/2023   Procedure: PACEMAKER IMPLANT;  Surgeon: Regan Lemming, MD;  Location: [facility] INVASIVE CV LAB;  Service: Cardiovascular;  Laterality: N/A;   PARTIAL COLECTOMY  2005   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;   Service: Urology;  Laterality: N/A;   71seeds implanted  no seeds found in bladder   TEE WITHOUT CARDIOVERSION N/A 07/05/2023   Procedure: TRANSESOPHAGEAL ECHOCARDIOGRAM;  Surgeon: [name], MD;  Location[facility] OR;  Service: Open Heart Surgery;  Laterality: N/A;   THROAT SURGERY  1940's   as child (age 52)growth that created fissure/corrective surgery   TONSILLECTOMY     age 66   XI ROBOTIC ASSISTED INGUINAL HERNIA REPAIR WITH MESH Right 05/08/2016   Procedure: XI ROBOTIC ASSISTED REPAIR OF RECURRENT RIGHT INGUINAL HERNIA;  Surgeon: Luretha Murphy, MD;  Location: [facility] ORS;  Service: General;  Laterality: Right;  With MESH    Medications Prior to Admission: Prior to Admission medications   Medication Sig Start Date End Date Taking? Authorizing Provider  acetaminophen (TYLENOL) 325 MG tablet  Take 1-2 tablets (325-650 mg total) by mouth every 6 (six) hours as needed for mild pain (pain score 1-3). 07/19/23   Stehler, Oren Bracket, PA-C  albuterol (PROVENTIL HFA;VENTOLIN HFA) 108 (90 BASE) MCG/ACT inhaler Inhale 2 puffs into the lungs every 6 (six) hours as needed for shortness of breath.    [provider]  amLODipine (NORVASC) 5 MG tablet Take 1 tablet (5 mg total) by mouth daily. 07/29/23 10/27/23  Flossie Dibble, NP  apixaban (ELIQUIS) 5 MG TABS tablet Take 1 tablet (5 mg total) by mouth 2 (two) times daily. 07/21/23   Stehler, Oren Bracket, PA-C  aspirin 81 MG chewable tablet Chew 1 tablet (81 mg total) by mouth daily. Patient not taking: Reported on 07/31/2023 07/15/23   Barrett, Rae Roam, PA-C  budesonide-formoterol (SYMBICORT) 160-4.5 MCG/ACT inhaler Inhale 2 puffs into the lungs 2 (two) times daily. 03/08/23   [provider]  cyanocobalamin (,VITAMIN B-12,) 1000 MCG/ML injection Inject 1 mL into the skin every 30 (thirty) days. 02/04/19   [provider]  dicyclomine (BENTYL) 10 MG capsule Take 10 mg by mouth 2 (two) times daily as needed for cramping (abdominal  pain/diarrhea.). 02/13/22   [provider]  feeding supplement (ENSURE ENLIVE / ENSURE PLUS) LIQD Take 237 mLs by mouth 3 (three) times daily with meals. 07/15/23   Barrett, Erin R, PA-C  fexofenadine (ALLEGRA) 180 MG tablet Take 180 mg by mouth daily.    [provider]  fluticasone (FLONASE) 50 MCG/ACT nasal spray Place 1 spray into both nostrils daily.     [provider]  furosemide (LASIX) 20 MG tablet Take 1 tablet (20 mg total) by mouth as needed. Take one tablet once a day for three days, then take one tablet daily as needed, for weight gain of 2 lbs in a day or more OR pedal edema 07/29/23 10/27/23  Flossie Dibble, NP  glimepiride (AMARYL) 2 MG tablet Take 2 mg by mouth in the morning. 01/11/23   [provider]  levothyroxine (SYNTHROID) 75 MCG tablet Take 75 mcg by mouth daily before breakfast.    [provider]  metoprolol succinate (TOPROL-XL) 100 MG 24 hr tablet Take 1 tablet (100 mg total) by mouth daily. Take with or immediately following a meal. 07/19/23   Stehler, Oren Bracket, PA-C  Multiple Vitamin (MULTIVITAMIN WITH MINERALS) TABS tablet Take 1 tablet by mouth daily.    [provider]  nystatin (MYCOSTATIN) 100000 UNIT/ML suspension Take 5 mLs (500,000 Units total) by mouth 4 (four) times daily. X 5 days 07/31/23   Barrett, Rae Roam, PA-C  omeprazole (PRILOSEC) 20 MG capsule Take 20 mg by mouth in the morning.    [provider]  potassium chloride SA (KLOR-CON M) 20 MEQ tablet Take 1 tablet (20 mEq total) by mouth as needed. Take one tablet once a day for three days, then take one tablet daily as needed when you take your Lasix. 07/29/23   Flossie Dibble, NP  rosuvastatin (CRESTOR) 20 MG tablet Take 1 tablet (20 mg total) by mouth daily. 01/22/23   Baldo Daub, MD  solifenacin (VESICARE) 10 MG tablet Take 10 mg by mouth in the morning. 01/17/21   [provider]  Tamsulosin HCl (FLOMAX) 0.4 MG CAPS Take 0.4 mg  by mouth 2 (two) times daily.     [provider]   Allergies:    Allergies  Allergen Reactions   Penicillins Anaphylaxis, Shortness Of Breath, Itching and Swelling  Throat swells up but no intubation per wife   Iodinated Contrast Media Hives and Rash   Aspirin Other (See Comments)    GI bleeding-avoids   Metformin And Related Diarrhea   Monodox [Doxycycline Hyclate] Nausea Only    "made me feel sick"   Sulfa Antibiotics Itching and Swelling   Tramadol Nausea And Vomiting   Social History:   Social History   Socioeconomic History   Marital status: Married    Spouse name: Not on file   Number of children: 2   Years of education: Not on file   Highest education level: Not on file  Occupational History   Occupation: Retired    Comment: Designer, fashion/clothing  Tobacco Use   Smoking status: Never    Passive exposure: Never   Smokeless tobacco: Never  Vaping Use   Vaping status: Never Used  Substance and Sexual Activity   Alcohol use: No   Drug use: No   Sexual activity: Not on file  Other Topics Concern   Not on file  Social History Narrative   Not on file   Social Drivers of Health   Financial Resource Strain: Not on file  Food Insecurity: No Food Insecurity (08/17/2023)   Hunger Vital Sign    Worried About Running Out of Food in the Last Year: Never true    Ran Out of Food in the Last Year: Never true  Transportation Needs: No Transportation Needs (08/17/2023)   PRAPARE - Administrator, Civil Service (Medical): No    Lack of Transportation (Non-Medical): No  Physical Activity: Not on file  Stress: Not on file  Social Connections: Not on file  Intimate Partner Violence: Not At Risk (08/17/2023)   Humiliation, Afraid, Rape, and Kick questionnaire    Fear of Current or Ex-Partner: No    Emotionally Abused: No    Physically Abused: No    Sexually Abused: No    Family History:   The patient's family history includes Brain cancer in an other family  member; Coronary artery disease in an other family member; Heart attack in an other family member; Prostate cancer in an other family member.    ROS:   Review of Systems: [y] = yes, [ ]  = no      General: Weight gain [ ] ; Weight loss [ ] ; Anorexia [ ] ; Fatigue [ ] ; Fever [ ] ; Chills [ ] ; Weakness [ ]    Cardiac: Chest pain/pressure [x] ; Resting SOB [ ] ; Exertional SOB [ ] ; Orthopnea [ ] ; Pedal Edema [ ] ; Palpitations [ ] ; Syncope [ ] ; Presyncope [x] ; Paroxysmal nocturnal dyspnea [ ]    Pulmonary: Cough [ ] ; Wheezing [ ] ; Hemoptysis [ ] ; Sputum [ ] ; Snoring [ ]    GI: Vomiting [ ] ; Dysphagia [ ] ; Melena [ ] ; Hematochezia [ ] ; Heartburn [ ] ; Abdominal pain [ ] ; Constipation [ ] ; Diarrhea [ ] ; BRBPR [ ]    GU: Hematuria [ ] ; Dysuria [ ] ; Nocturia [ ]  Vascular: Pain in legs with walking [ ] ; Pain in feet with lying flat [ ] ; Non-healing sores [ ] ; Stroke [ ] ; TIA [ ] ; Slurred speech [ ] ;   Neuro: Headaches [ ] ; Vertigo [ ] ; Seizures [ ] ; Paresthesias [ ] ;Blurred vision [ ] ; Diplopia [ ] ; Vision changes [ ]    Ortho/Skin: Arthritis [ ] ; Joint pain [ ] ; Muscle pain [ ] ; Joint swelling [ ] ; Back Pain [ ] ; Rash [ ]    Psych: Depression [ ] ; Anxiety [ ]   Heme: Bleeding problems [ ] ; Clotting disorders [ ] ; Anemia [ ]    Endocrine: Diabetes [ ] ; Thyroid dysfunction [ ]    Physical Exam/Data:   Vitals:   08/17/23 1724 08/17/23 1739  BP:  119/68  Pulse:  (!) 109  Resp:  18  Temp: 99.1 F (37.3 C) 98.4 F (36.9 C)  TempSrc:  Oral  SpO2:  96%   No intake or output data in the 24 hours ending 08/17/23 1922    07/31/2023    2:33 PM 07/29/2023    2:45 PM 07/20/2023    3:20 AM  Last 3 Weights  Weight (lbs) 139 lb 143 lb 139 lb 15.9 oz  Weight (kg) 63.05 kg 64.864 kg 63.5 kg     There is no height or weight on file to calculate BMI.   General:  Well nourished, well developed, in no acute distress  HEENT: normal Lymph: no adenopathy Neck: no JVD Endocrine:  No thryomegaly Vascular: No carotid  bruits; FA pulses 2+ bilaterally without bruits  Cardiac:  normal S1, S2; RRR; no murmur  Lungs:  clear to auscultation bilaterally, no wheezing, rhonchi or rales  Abd: soft, nontender, no hepatomegaly  Ext: no edema Musculoskeletal:  No deformities, BUE and BLE strength normal and equal Skin: warm and dry, midline sternotomy incision well healed, LEFT infraclavicular PPM incision well healed  Psych:  Normal affect   EKG:  The ECG that was done (08/17/23) was personally reviewed and demonstrates ST 104, PR 170, QRS 112, QT/Qtc 376/494  Relevant CV Studies: TTE [facility]) Result date: 08/10/23 LVEF 70-75%, mild LAE, mild MS, BP AV well seated   Coronary angiography  Result date: 05/03/23 pRCA-mRCA 50%, mLM-dLM 30%, pLCx to dLCx 50%, pLAD-mLAD 50%, D1 60%, severe 4+ AR  Laboratory Data:  High Sensitivity Troponin:  No results for input(s): "TROPONINIHS" in the last 720 hours.    ChemistryNo results for input(s): "NA", "K", "CL", "CO2", "GLUCOSE", "BUN", "CREATININE", "CALCIUM", "GFRNONAA", "GFRAA", "ANIONGAP" in the last 168 hours.  No results for input(s): "PROT", "ALBUMIN", "AST", "ALT", "ALKPHOS", "BILITOT" in the last 168 hours. HematologyNo results for input(s): "WBC", "RBC", "HGB", "HCT", "MCV", "MCH", "MCHC", "RDW", "PLT" in the last 168 hours. BNPNo results for input(s): "BNP", "PROBNP" in the last 168 hours.  DDimer No results for input(s): "DDIMER" in the last 168 hours.  Radiology/Studies:  No results found. { Assessment and Plan:  76M with CAD and severe AR s/p 1v CABG (LIMA-LAD)+21 mm Inspiris AVR (07/05/23, Welder) c/b post op paroxysmal AF and subsequent post op symptomatic CHB requiring LEFT Abbott DC PPM implant (07/17/23, Camnitz) who presented to [facility] for recurrent falls was transferred to [facility] for ongoing evaluation.  Recurrent falls  Presyncope Sinus tach vs AT He has had 3 falls in the past week that do not seem related to acute changes in  position. Normotensive to hypotensive here. He had several medications changed in the post op period. Labs all unremarkable and TTE with well seated valve and normal LVEF. Possible polypharmacy contributing to falls. Toprol XL 100 mg daily was started post op in the setting of post op AF. He previously was tx with a combination of ARB/CCB and thiazide diuretic. He then was gradually taken off this combination and was only on amlodipine prior to his recent hospitalization. I had planned to reduce his toprol but his rate has been fixed at 100 since admission and gradually increased to 110-120 this morning. Review of ECG at [facility] with ST at 113.  Device interrogation with bell curve histogram so I am assuming this is either an AT or sinus tach. Attempted PES through device but seemed to respond like sinus tach. I don't have a reason why he would be in sinus tach overnight while asleep with all workup unremarkable currently. Device interrogation with stable impedance and thresholds, AP <1% and VP <1% (DDD 60/120). Will resume toprol this morning and see what rates do throughout the day.   Post op CHB (resolved)  Post op AF Prior subacute CVA - continue apixaban 5 mg bid (36M, wt >60 kg, sCr 0.9)   DM2 A1c (6.1) on glimepiride, previously 6.6. I would preference d/c glimepiride given his age. He would probably have more benefit and less potential risk from starting SGLT2i with his age, CAD and possible HFpEF.  - start jardiance 10 mg daily - d/c glimepiride   BPH Continue OP flomax. He has been on this chronically, may be exacerbating current sx but will make changes above first.   HTN - Currently normotensive and holding amlodipine with recurrent falls as above   Severity of Illness: The appropriate patient status for this patient is OBSERVATION. Observation status is judged to be reasonable and necessary in order to provide the required intensity of service to ensure the patient's safety. The  patient's presenting symptoms, physical exam findings, and initial radiographic and laboratory data in the context of their medical condition is felt to place them at decreased risk for further clinical deterioration. Furthermore, it is anticipated that the patient will be medically stable for discharge from the hospital within 2 midnights of admission.    For questions or updates, please contact [facility] Please consult [email or web address] for contact info under   Signed, Linton Rump, MD  08/17/2023 7:22 PM

## 2023-08-17 NOTE — H&P (Incomplete)
Cardiology Admission History and Physical   Patient ID: Bruce Nunez MRN: [id number]; DOB: Apr 07, 1941   Admission date: (Not on file)  PCP:  Gus Height, PA   [facility] Providers Cardiologist:  None   { Click here to update MD or APP on Care Team, Refresh:1}     Chief Complaint:    Patient Profile:   Bruce Nunez is a 82 y.o. 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  History of Present Illness:   [name] endorsed progressive fatigue and chest pain for 6 months. 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 He previously underwent AVR evaluation in 04/2023 and was found to have 50% stenosis of LAD was found to have angiographic evidence of severe AI. He underwent single vessel CABG on 11/8+ AVR with 21mm inspiris. His course was c/b AV block that required PPM placement. He was hospitalized until 07/20/2023.    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   Heart murmur    Hemorrhoids    History of kidney stones    Hx of colon cancer, stage I    Hx of nonmelanoma skin cancer    Hx of peptic ulcer    as teenager   Hx of transfusion    Hyperlipidemia    Hypertension    Inguinal hernia    PONV (postoperative nausea and vomiting)    only when had Ether as Anesthesia drug   Prostate cancer (HCC)  08/15/2012   also Hx colon cancer / Hx skin cancer   Right inguinal hernia 05/08/2016   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   S/P right inguinal hernia repair Sept 2015 05/17/2014   Recurrent right indirect  Hernia treated with robotic TAPP repair with enterolysis Sept 2017   Stroke [facility]) 03/28/2022    Past Surgical History:  Procedure Laterality Date   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   Procedure: AORTIC VALVE REPLACEMENT WITH INSPIRIS AORTIC VALVE;  Surgeon: [name], MD;  Location: [facility] OR;  Service: Open Heart Surgery;  Laterality: N/A;   cataract surgery  2009   CORONARY ARTERY BYPASS GRAFT N/A 07/05/2023   Procedure: CORONARY ARTERY BYPASS GRAFTING, USING LEFT INTERNAL MAMMARY ARTERY;  Surgeon: [name], MD;  Location: [facility] OR;  Service: Open Heart Surgery;  Laterality: N/A;   CYSTOSCOPY     INGUINAL HERNIA REPAIR Right 05/17/2014   Procedure: RIGHT INGUINAL HERNIA REPAIR ;  Surgeon: Wenda Low, MD;  Location: [facility] ORS;  Service: General;  Laterality: Right;  With MESH   KNEE SURGERY Right 2012   LEFT HEART CATH AND CORONARY ANGIOGRAPHY N/A 05/03/2023   Procedure: LEFT HEART CATH AND CORONARY ANGIOGRAPHY;  Surgeon:  Tonny Bollman, MD;  Location: [facility] INVASIVE CV LAB;  Service: Cardiovascular;  Laterality: N/A;   PACEMAKER IMPLANT N/A 07/17/2023   Procedure: PACEMAKER IMPLANT;  Surgeon: Regan Lemming, MD;  Location: [facility] INVASIVE CV LAB;  Service: Cardiovascular;  Laterality: N/A;   PARTIAL COLECTOMY  2005   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   RADIOACTIVE SEED IMPLANT N/A 11/24/2012   Procedure: RADIOACTIVE SEED IMPLANT;  Surgeon: Valetta Fuller, MD;  Location[facility];  Service: Urology;  Laterality: N/A;   71seeds implanted  no seeds found in bladder   TEE WITHOUT CARDIOVERSION N/A 07/05/2023   Procedure: TRANSESOPHAGEAL ECHOCARDIOGRAM;  Surgeon: [name], MD;  Location: [facility] OR;  Service: Open  Heart Surgery;  Laterality: N/A;   THROAT SURGERY  1940's   as child (age 76)growth that created fissure/corrective surgery   TONSILLECTOMY     age 75   XI ROBOTIC ASSISTED INGUINAL HERNIA REPAIR WITH MESH Right 05/08/2016   Procedure: XI ROBOTIC ASSISTED REPAIR OF RECURRENT RIGHT INGUINAL HERNIA;  Surgeon: Luretha Murphy, MD;  Location: [facility] ORS;  Service: General;  Laterality: Right;  With MESH     Medications Prior to Admission: Prior to Admission medications   Medication Sig Start Date End Date Taking? Authorizing Provider  acetaminophen (TYLENOL) 325 MG tablet Take 1-2 tablets (325-650 mg total) by mouth every 6 (six) hours as needed for mild pain (pain score 1-3). 07/19/23   Stehler, Oren Bracket, PA-C  albuterol (PROVENTIL HFA;VENTOLIN HFA) 108 (90 BASE) MCG/ACT inhaler Inhale 2 puffs into the lungs every 6 (six) hours as needed for shortness of breath.    [provider]  amLODipine (NORVASC) 5 MG tablet Take 1 tablet (5 mg total) by mouth daily. 07/29/23 10/27/23  Flossie Dibble, NP  apixaban (ELIQUIS) 5 MG TABS tablet Take 1 tablet (5 mg total) by mouth 2 (two) times daily. 07/21/23   Stehler, Oren Bracket, PA-C  aspirin 81 MG chewable tablet Chew 1 tablet (81 mg total) by mouth daily. Patient not taking: Reported on 07/31/2023 07/15/23   Barrett, Rae Roam, PA-C  budesonide-formoterol (SYMBICORT) 160-4.5 MCG/ACT inhaler Inhale 2 puffs into the lungs 2 (two) times daily. 03/08/23   [provider]  cyanocobalamin (,VITAMIN B-12,) 1000 MCG/ML injection Inject 1 mL into the skin every 30 (thirty) days. 02/04/19   [provider]  dicyclomine (BENTYL) 10 MG capsule Take 10 mg by mouth 2 (two) times daily as needed for cramping (abdominal pain/diarrhea.). 02/13/22   [provider]  feeding supplement (ENSURE ENLIVE / ENSURE PLUS) LIQD Take 237 mLs by mouth 3 (three) times daily with meals. 07/15/23   Barrett, Erin R, PA-C  fexofenadine (ALLEGRA) 180 MG tablet Take 180 mg  by mouth daily.    [provider]  fluticasone (FLONASE) 50 MCG/ACT nasal spray Place 1 spray into both nostrils daily.     [provider]  furosemide (LASIX) 20 MG tablet Take 1 tablet (20 mg total) by mouth as needed. Take one tablet once a day for three days, then take one tablet daily as needed, for weight gain of 2 lbs in a day or more OR pedal edema 07/29/23 10/27/23  Flossie Dibble, NP  glimepiride (AMARYL) 2 MG tablet Take 2 mg by mouth in the morning. 01/11/23   [provider]  levothyroxine (SYNTHROID) 75 MCG tablet Take 75 mcg by mouth daily before breakfast.    [provider]  metoprolol succinate (TOPROL-XL) 100 MG 24  hr tablet Take 1 tablet (100 mg total) by mouth daily. Take with or immediately following a meal. 07/19/23   Stehler, Oren Bracket, PA-C  Multiple Vitamin (MULTIVITAMIN WITH MINERALS) TABS tablet Take 1 tablet by mouth daily.    [provider]  nystatin (MYCOSTATIN) 100000 UNIT/ML suspension Take 5 mLs (500,000 Units total) by mouth 4 (four) times daily. X 5 days 07/31/23   Barrett, Rae Roam, PA-C  omeprazole (PRILOSEC) 20 MG capsule Take 20 mg by mouth in the morning.    [provider]  potassium chloride SA (KLOR-CON M) 20 MEQ tablet Take 1 tablet (20 mEq total) by mouth as needed. Take one tablet once a day for three days, then take one tablet daily as needed when you take your Lasix. 07/29/23   Flossie Dibble, NP  rosuvastatin (CRESTOR) 20 MG tablet Take 1 tablet (20 mg total) by mouth daily. 01/22/23   Baldo Daub, MD  solifenacin (VESICARE) 10 MG tablet Take 10 mg by mouth in the morning. 01/17/21   [provider]  Tamsulosin HCl (FLOMAX) 0.4 MG CAPS Take 0.4 mg by mouth 2 (two) times daily.     [provider]     Allergies:    Allergies  Allergen Reactions   Penicillins Anaphylaxis, Shortness Of Breath, Itching and Swelling    Throat swells up but no intubation per wife   Iodinated  Contrast Media Hives and Rash   Aspirin Other (See Comments)    GI bleeding-avoids   Metformin And Related Diarrhea   Monodox [Doxycycline Hyclate] Nausea Only    "made me feel sick"   Sulfa Antibiotics Itching and Swelling   Tramadol Nausea And Vomiting    Social History:   Social History   Socioeconomic History   Marital status: Married    Spouse name: Not on file   Number of children: 2   Years of education: Not on file   Highest education level: Not on file  Occupational History   Occupation: Retired    Comment: Designer, fashion/clothing  Tobacco Use   Smoking status: Never    Passive exposure: Never   Smokeless tobacco: Never  Vaping Use   Vaping status: Never Used  Substance and Sexual Activity   Alcohol use: No   Drug use: No   Sexual activity: Not on file  Other Topics Concern   Not on file  Social History Narrative   Not on file   Social Drivers of Health   Financial Resource Strain: Not on file  Food Insecurity: No Food Insecurity (07/08/2023)   Hunger Vital Sign    Worried About Running Out of Food in the Last Year: Never true    Ran Out of Food in the Last Year: Never true  Transportation Needs: No Transportation Needs (07/08/2023)   PRAPARE - Administrator, Civil Service (Medical): No    Lack of Transportation (Non-Medical): No  Physical Activity: Not on file  Stress: Not on file  Social Connections: Not on file  Intimate Partner Violence: Not At Risk (07/08/2023)   Humiliation, Afraid, Rape, and Kick questionnaire    Fear of Current or Ex-Partner: No    Emotionally Abused: No    Physically Abused: No    Sexually Abused: No    Family History:  *** The patient's family history includes Brain cancer in an other family member; Coronary artery disease in an other family member; Heart attack in an other family member; Prostate cancer in an other  family member.    ROS:  Please see the history of present illness.  ***All other ROS reviewed and negative.      Physical Exam/Data:  There were no vitals filed for this visit. No intake or output data in the 24 hours ending 08/17/23 0000    07/31/2023    2:33 PM 07/29/2023    2:45 PM 07/20/2023    3:20 AM  Last 3 Weights  Weight (lbs) 139 lb 143 lb 139 lb 15.9 oz  Weight (kg) 63.05 kg 64.864 kg 63.5 kg     There is no height or weight on file to calculate BMI.  General:  Well nourished, well developed, in no acute distress*** HEENT: normal Neck: no*** JVD Vascular: No carotid bruits; Distal pulses 2+ bilaterally   Cardiac:  normal S1, S2; RRR; no murmur *** Lungs:  clear to auscultation bilaterally, no wheezing, rhonchi or rales  Abd: soft, nontender, no hepatomegaly  Ext: no*** edema Musculoskeletal:  No deformities, BUE and BLE strength normal and equal Skin: warm and dry  Neuro:  CNs 2-12 intact, no focal abnormalities noted Psych:  Normal affect    EKG:  The ECG that was done *** was personally reviewed and demonstrates ***  Relevant CV Studies: ***  Laboratory Data:  High Sensitivity Troponin:  No results for input(s): "TROPONINIHS" in the last 720 hours.    ChemistryNo results for input(s): "NA", "K", "CL", "CO2", "GLUCOSE", "BUN", "CREATININE", "CALCIUM", "MG", "GFRNONAA", "GFRAA", "ANIONGAP" in the last 168 hours.  No results for input(s): "PROT", "ALBUMIN", "AST", "ALT", "ALKPHOS", "BILITOT" in the last 168 hours. Lipids No results for input(s): "CHOL", "TRIG", "HDL", "LABVLDL", "LDLCALC", "CHOLHDL" in the last 168 hours. HematologyNo results for input(s): "WBC", "RBC", "HGB", "HCT", "MCV", "MCH", "MCHC", "RDW", "PLT" in the last 168 hours. Thyroid No results for input(s): "TSH", "FREET4" in the last 168 hours. BNPNo results for input(s): "BNP", "PROBNP" in the last 168 hours.  DDimer No results for input(s): "DDIMER" in the last 168 hours.   Radiology/Studies:  No results found.   Assessment and Plan:   ***   Risk Assessment/Risk Scores:  {Complete the  following score calculators/questions to meet required metrics.  Press F2:1}  {Is the patient being seen for unstable angina, ACS, NSTEMI or STEMI?:[id number]} {Does this patient have CHF or CHF symptoms?      :[id number]} {Does this patient have ATRIAL FIBRILLATION?:[id number]}  Code Status: {Please document patient code status       :29822}  Severity of Illness: {Observation/Inpatient:21159}   For questions or updates, please contact [facility] Please consult [email or web address] for contact info under     Signed, Donavan Burnet, MD  08/17/2023 12:00 AM

## 2023-08-17 NOTE — Progress Notes (Incomplete)
At [facility] Friday afternoon /Friday night  Nursing home for 5 days Then having some falls  Wednesday and Thursday had falls Shaving with walker in front of him, fell backwards and fell with walker  No head trauma, hit back of right shoulder Felt that he lost all of his strength  Last time he was shaving and had been stanidng there for awhiule when it happened No trip or mechnical issues No LOC  First episode was Wednesday AM  Thursday AM similar episode  Walking down hall and fell  Had not just stood up  Wasn't using walker that time Second episode hit left shoulder, no head trauima   Third fall Friday am  Had just woken up and had breakfast  Walked across floor and went down  Had just stook up from table that episode  Got to restroom and did have some head contact and right of back   He can "feel it coming on" but can't respond to it quick enough  Feeling coming on is weakness and "head buzzing" no tunnel vision, blackout episodes  Just presyncopal   No longer on aspirin   Eating and drinking normallyh No change in diet  VS  P `104, RR 21, O2 100% BP 94/53 (60) supine Repeat 121/68 (63) lying supine  Feels fine sitting upright No posiitoinal changes HR 117  BP 126/77 (92)   Feels fine standing up Stable on feet HR 115 BP 125/66 (84)   Friday PM around dinner 5/10 central radiating bilateral shoulders but not to arms  Lasted 30-40 minutes  This was at [place]  Had some shoulder pain the day prior  Had no CP prior to surgery  Knew he had leaking valve but no anginal equiv  He and his wife at home   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

## 2023-08-18 ENCOUNTER — Other Ambulatory Visit: Payer: Self-pay

## 2023-08-18 ENCOUNTER — Observation Stay (HOSPITAL_COMMUNITY): Payer: Medicare Other

## 2023-08-18 DIAGNOSIS — R5381 Other malaise: Secondary | ICD-10-CM

## 2023-08-18 DIAGNOSIS — I10 Essential (primary) hypertension: Secondary | ICD-10-CM | POA: Diagnosis not present

## 2023-08-18 DIAGNOSIS — I442 Atrioventricular block, complete: Secondary | ICD-10-CM | POA: Diagnosis not present

## 2023-08-18 DIAGNOSIS — N400 Enlarged prostate without lower urinary tract symptoms: Secondary | ICD-10-CM | POA: Diagnosis not present

## 2023-08-18 DIAGNOSIS — E119 Type 2 diabetes mellitus without complications: Secondary | ICD-10-CM | POA: Diagnosis not present

## 2023-08-18 DIAGNOSIS — R55 Syncope and collapse: Secondary | ICD-10-CM

## 2023-08-18 DIAGNOSIS — D500 Iron deficiency anemia secondary to blood loss (chronic): Secondary | ICD-10-CM | POA: Diagnosis not present

## 2023-08-18 DIAGNOSIS — Z7901 Long term (current) use of anticoagulants: Secondary | ICD-10-CM | POA: Diagnosis not present

## 2023-08-18 DIAGNOSIS — I480 Paroxysmal atrial fibrillation: Secondary | ICD-10-CM | POA: Diagnosis not present

## 2023-08-18 DIAGNOSIS — Z951 Presence of aortocoronary bypass graft: Secondary | ICD-10-CM

## 2023-08-18 DIAGNOSIS — Z8673 Personal history of transient ischemic attack (TIA), and cerebral infarction without residual deficits: Secondary | ICD-10-CM | POA: Diagnosis not present

## 2023-08-18 DIAGNOSIS — I2510 Atherosclerotic heart disease of native coronary artery without angina pectoris: Secondary | ICD-10-CM | POA: Diagnosis not present

## 2023-08-18 DIAGNOSIS — I351 Nonrheumatic aortic (valve) insufficiency: Secondary | ICD-10-CM | POA: Diagnosis not present

## 2023-08-18 DIAGNOSIS — J45909 Unspecified asthma, uncomplicated: Secondary | ICD-10-CM | POA: Diagnosis not present

## 2023-08-18 DIAGNOSIS — R296 Repeated falls: Secondary | ICD-10-CM | POA: Diagnosis not present

## 2023-08-18 DIAGNOSIS — Z952 Presence of prosthetic heart valve: Secondary | ICD-10-CM | POA: Diagnosis not present

## 2023-08-18 DIAGNOSIS — Z79899 Other long term (current) drug therapy: Secondary | ICD-10-CM | POA: Diagnosis not present

## 2023-08-18 DIAGNOSIS — Z85038 Personal history of other malignant neoplasm of large intestine: Secondary | ICD-10-CM | POA: Diagnosis not present

## 2023-08-18 LAB — ECHOCARDIOGRAM LIMITED
AR max vel: 0.94 cm2
AV Area VTI: 1.12 cm2
AV Area mean vel: 0.96 cm2
AV Mean grad: 13.3 mm[Hg]
AV Peak grad: 25.7 mm[Hg]
Ao pk vel: 2.53 m/s
Height: 67 in
Weight: 2224 [oz_av]

## 2023-08-18 LAB — BASIC METABOLIC PANEL
Anion gap: 8 (ref 5–15)
BUN: 15 mg/dL (ref 8–23)
CO2: 22 mmol/L (ref 22–32)
Calcium: 9 mg/dL (ref 8.9–10.3)
Chloride: 104 mmol/L (ref 98–111)
Creatinine, Ser: 0.89 mg/dL (ref 0.61–1.24)
GFR, Estimated: >60 mL/min (ref >60)
Glucose, Bld: 103 mg/dL — ABNORMAL HIGH (ref 70–99)
Potassium: 4.2 mmol/L (ref 3.5–5.1)
Sodium: 134 mmol/L — ABNORMAL LOW (ref 135–145)

## 2023-08-18 LAB — GLUCOSE, CAPILLARY
Glucose-Capillary: 101 mg/dL — ABNORMAL HIGH (ref 70–99)
Glucose-Capillary: 96 mg/dL (ref 70–99)
Glucose-Capillary: 115 mg/dL — ABNORMAL HIGH (ref 70–99)

## 2023-08-18 LAB — CBC
HCT: 33.9 % — ABNORMAL LOW (ref 39.0–52.0)
Hemoglobin: 10.6 g/dL — ABNORMAL LOW (ref 13.0–17.0)
MCH: 25.1 pg — ABNORMAL LOW (ref 26.0–34.0)
MCHC: 31.3 g/dL (ref 30.0–36.0)
MCV: 80.3 fL (ref 80.0–100.0)
Platelets: 195 10*3/uL (ref 150–400)
RBC: 4.22 MIL/uL (ref 4.22–5.81)
RDW: 13.8 % (ref 11.5–15.5)
WBC: 9 10*3/uL (ref 4.0–10.5)
nRBC: 0 % (ref 0.0–0.2)

## 2023-08-18 LAB — TSH: TSH: 0.453 u[IU]/mL (ref 0.350–4.500)

## 2023-08-18 LAB — MAGNESIUM: Magnesium: 1.9 mg/dL (ref 1.7–2.4)

## 2023-08-18 MED ORDER — METOPROLOL SUCCINATE ER 100 MG PO TB24
100.0000 mg | ORAL_TABLET | Freq: Every day | ORAL | Status: DC
  Administered 2023-08-18 – 2023-08-20 (×3): 100 mg via ORAL
  Filled 2023-08-18 (×3): qty 1

## 2023-08-18 NOTE — Evaluation (Signed)
Occupational Therapy Evaluation Patient Details Name: Bruce Nunez MRN: [id number] DOB: 1940/10/19 Today's Date: 08/18/2023   History of Present Illness Pt is an 82 y.o. male who presented to [facility] on 12/21 with episode of chest discomfort and three falls in home in the past week without loss of consciousness and without injury leading to concern regarding orthostatic hypotension. Pt also noted to have sinus tachycardia. 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 Just prior to this admission, pt had recently completed [facility] OT and was continuing to receive [facility] PT following AVR with CABG and pacemaker placement in 06/2023. Pt now presents at or within 95% of baseline with pt presenting with mildly decreased balance, activity tolerance, and decreased cognition suspected due to medication. Pt currently completing ADLs Independent to Contact guard assist for safety and functional mobility with RW with Contact guard assist for safety. Pt and family report pt has not been drinking much water over the past few weeks. OT educated pt and family in importance of staying hydrated with pt and family verbalizing understanding. Pt BP this session were 128/100 in laying, in sitting 112/55, in standing 119/92, in standing at 1 minute 119/72, and in standing at 3 minutes 107/85. Pt's HR in the upper 90s to mid-120s and O2 sat >/97% on RA throughout session. Pt participated well in session and has a very supportive family. Pt will benefit from acute skilled OT services for education related to fall prevention, recognizing and managing possible signs of orthostatic  hypotension, and increased safety and independence with functional tasks. No post-acute OT follow up is indicated at this time. OT recommends pt complete in-progress course of HH PT once he returns home.       If plan is discharge home, recommend the following: A little help with walking and/or transfers;A little help with bathing/dressing/bathroom;Help with stairs or ramp for entrance;Direct supervision/assist for medications management;Assist for transportation;Direct supervision/assist for financial management    Functional Status Assessment  Patient has had a recent decline in their functional status and demonstrates the ability to make significant improvements in function in a reasonable and predictable amount of time.  Equipment Recommendations  None recommended by OT (Pt already has needed equipment)    Recommendations for Other Services       Precautions / Restrictions Precautions Precautions: Fall Restrictions Weight Bearing Restrictions Per Provider Order: No Other Position/Activity Restrictions: sternal precautions from AVR with CABG and pacemaker placement in 06/2023      Mobility Bed Mobility Overal bed mobility: Modified Independent             General bed mobility comments: mildly increased time    Transfers Overall transfer level: Needs assistance Equipment used: Rolling walker (2 wheels) Transfers: Sit to/from Stand, Bed to chair/wheelchair/BSC Sit to Stand: Contact guard assist (occasional cues for hand placement)     Step pivot transfers: Contact guard assist (with RW)     General transfer comment: CGA for safety with no physical assist needed      Balance Overall balance assessment: Mild deficits observed, not formally tested  ADL either performed or assessed with clinical judgement   ADL Overall ADL's : Modified independent;Needs assistance/impaired Eating/Feeding:  Independent;Sitting   Grooming: Contact guard assist;Standing   Upper Body Bathing: Supervision/ safety;Sitting   Lower Body Bathing: Supervison/ safety;Contact guard assist;Sit to/from stand;Sitting/lateral leans;Cueing for safety   Upper Body Dressing : Modified independent;Sitting   Lower Body Dressing: Supervision/safety;Contact guard assist;Sitting/lateral leans;Sit to/from stand   Toilet Transfer: Contact guard assist;Ambulation;Rolling walker (2 wheels);Regular Toilet;Cueing for safety   Toileting- Clothing Manipulation and Hygiene: Contact guard assist;Sitting/lateral lean;Sit to/from stand       Functional mobility during ADLs: Contact guard assist;Rolling walker (2 wheels);Cueing for safety General ADL Comments: Pt at or within 95% of baseline with Supervision to CGA recommended for safty but no physical assist needed during ADLs.     Vision Baseline Vision/History: 1 Wears glasses Ability to See in Adequate Light: 0 Adequate (with glasses) Patient Visual Report: No change from baseline       Perception         Praxis         Pertinent Vitals/Pain Pain Assessment Pain Assessment: No/denies pain     Extremity/Trunk Assessment Upper Extremity Assessment Upper Extremity Assessment: Right hand dominant;Overall [facility] for tasks assessed   Lower Extremity Assessment Lower Extremity Assessment: Defer to PT evaluation       Communication Communication Communication: No apparent difficulties;Hearing impairment   Cognition Arousal: Alert Behavior During Therapy: WFL for tasks assessed/performed Overall Cognitive Status: Impaired/Different from baseline (Mild confusion suspect due to medication per RN and family as pt was given morphine overnight) Area of Impairment: Orientation, Attention, Memory, Safety/judgement, Awareness, Problem solving                 Orientation Level: Disoriented to, Place (Pt knew he was in the hospital, but did not know he was in  [place] or at [facility].) Current Attention Level: Selective Memory: Decreased short-term memory   Safety/Judgement: Decreased awareness of safety, Decreased awareness of deficits Awareness: Emergent Problem Solving: Slow processing General Comments: Pt alert and oriented to self, family, time, date, and situation. Pt pleasant throughout session and able to follow 1 and 2-step instructions consitently. Pt with noted mild cognitive decline this day with RN and family reporting confusion started with administration of morphine overnight and suspected confusion is due to medication.     General Comments  Pt reports intermittent dizziness, brain fog, tightness accross shoulders, and bladder/bowel urgency upon standing, but reports none of these on this day. Orthos taken with BP in laying 128/100, in sitting 112/55, in standing 119/92, in standing at 1 minute 119/72, and in standing at 3 minutes 107/85. Pt's HR in the upper 90s to mid-120s and O2 sat >/97% on RA throughout session. Multiple family members, including pt's wife, present and supportive throughout session. RN present during a portion of session.    Exercises     Shoulder Instructions      Home Living Family/patient expects to be discharged to:: Private residence Living Arrangements: Spouse/significant other Available Help at Discharge: Family;Available 24 hours/day Type of Home: House Home Access: Stairs to enter;Other (comment) Entrance Stairs-Number of Steps: 2-3 through garage; has stair lift   Home Layout: One level     Bathroom Shower/Tub: Producer, television/film/video: Standard     Home Equipment: Shower seat;Other (comment);Cane - single point;Rolling Walker (2 wheels) (stair lift)          Prior Functioning/Environment Prior Level of Function : Independent/Modified Independent;Needs assist;History of  Falls (last six months)             Mobility Comments: Prior to cardiac surgeries in 06/2023, pt was  Independent to Mod I  for funcitonal mobility with intermittent use of SPC. Since discharging home in 06/2023, pt has been receiving HH PT and ambulating with a RW in the home with Mod I. Pt had three falls in the past week. Per MD note, falls happened on different days and various situations. However, per pt and family reports, all falls haapened on the same day a week ago Friday and all happen while pt was standing at the commode with pt now sitting for urination since that time. ADLs Comments: Prior to cardiac surgeries in 06/2023, pt was Independent to Mod I with ADLs and IADLs. Pt received HH OT follow acute discharge in 06/2023 and has now completed [facility] OT. Just prior to this admission, pt completing ADLs with Independent to Supervision for safety.        OT Problem List: Impaired balance (sitting and/or standing);Decreased cognition;Decreased safety awareness      OT Treatment/Interventions: Patient/family education;Self-care/ADL training    OT Goals(Current goals can be found in the care plan section) Acute Rehab OT Goals Patient Stated Goal: to return home and contiue with [facility] PT OT Goal Formulation: With patient/family Time For Goal Achievement: 09/01/23 Potential to Achieve Goals: Good ADL Goals Pt Will Perform Grooming: with modified independence;standing Pt Will Transfer to Toilet: with modified independence;ambulating;regular height toilet (with least restrictive AD) Pt Will Perform Toileting - Clothing Manipulation and hygiene: with modified independence;sit to/from stand;sitting/lateral leans Additional ADL Goal #1: Patient will demonstrate understanding of education related to recognizing and responding to possible signs of orthostatic hypotension through teach back with handout provided. Additional ADL Goal #2: Patient will demonstrate ability to Independently state 3 fall prevention strategies to increase safety and independence with functional tasks and decrease fall risk in  the home.  OT Frequency: Min 1X/week    Co-evaluation              AM-PAC OT "6 Clicks" Daily Activity     Outcome Measure Help from another person eating meals?: None Help from another person taking care of personal grooming?: A Little Help from another person toileting, which includes using toliet, bedpan, or urinal?: A Little Help from another person bathing (including washing, rinsing, drying)?: A Little Help from another person to put on and taking off regular upper body clothing?: None Help from another person to put on and taking off regular lower body clothing?: A Little 6 Click Score: 20   End of Session Equipment Utilized During Treatment: Gait belt;Rolling walker (2 wheels) Nurse Communication: Mobility status;Other (comment) (vital signs; pt and family report pt has not been drinking much water over the past few weeks)  Activity Tolerance: Patient tolerated treatment well Patient left: in bed;with call bell/phone within reach;with family/visitor present  OT Visit Diagnosis: Repeated falls (R29.6);Unsteadiness on feet (R26.81);Other symptoms and signs involving cognitive function                Time: 1610-9604 OT Time Calculation (min): 46 min Charges:  OT General Charges $OT Visit: 1 Visit OT Evaluation $OT Eval Low Complexity: 1 Low OT Treatments $Self Care/Home Management : 8-22 mins $Therapeutic Activity: 8-22 mins  Turner Baillie "Orson Eva., OTR/L, MA Acute Rehab [phone]   Lendon Colonel 08/18/2023, 12:50 PM

## 2023-08-18 NOTE — Care Management Obs Status (Signed)
MEDICARE OBSERVATION STATUS NOTIFICATION   Patient Details  Name: Bruce Nunez MRN: [id number] Date of Birth: [birth year]   Medicare Observation Status Notification Given:  Yes    Ronny Bacon, RN 08/18/2023, 8:31 AM

## 2023-08-18 NOTE — Evaluation (Signed)
Physical Therapy Evaluation Patient Details Name: Bruce Nunez MRN: [id number] DOB: Jan 21, 1941 Today's Date: 08/18/2023  History of Present Illness  Pt is an 82 y.o. male who presented to [facility] on [facility]/21 with episode of chest discomfort and three falls in home in the past week without loss of consciousness and without injury leading to concern regarding orthostatic hypotension. Pt also noted to have sinus tachycardia. 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 PTA, pt and family reported to this PT that he was independent without AD, but reported during OT Eval earlier today that he was mod I utilizing a RW. Pt resides with his wife in a 1-level house with 2-3 STE. Currently, pt is demonstrating deficits in cognition, which the family believes is due to the morphine he received overnight. Educated pt and family on delirium precautions. Pt is also demonstrating deficits in balance, activity tolerance, and gross strength. He is not very compliant with his sternal precautions and needed repeated reminders to comply to them when performing functional mobility this date. The pt required minA for transfers and CGA-minA to ambulate around the room without UE support. He is at risk for falls. Recommending pt utilize his RW/rollator for all standing mobility at d/c. Pt was also limited this date by a drop in his BP, resulting in him getting tired easily and needing to sit after short gait bouts, see below. Notified RN and MD of pt's poor compliance with his sternal precautions and his HR and BP noted below. Recommending pt resume HHPT upon d/c. Will continue to  follow acutely.  **monitor alarmed "V Tach" at one point and there did seem to be a few second run of an arrhythmia there but pt did not seem to be symptomatic, notified RN and MD. MD assessed Tele and reported it looked okay  BP:  132/72 sitting EOB 125/97 (108) standing 105/64 (77) standing ~3 min (indicated he needed to sit but could not identify why until PT questioned him further, which he then admitted to feeling fatigued and mildly lightheaded) 122/85 after resting sitting EOB several minutes 113/73 sitting after ambulating around the room again (pt seemed more pale while ambulating)      If plan is discharge home, recommend the following: A little help with walking and/or transfers;A little help with bathing/dressing/bathroom;Assistance with cooking/housework;Direct supervision/assist for medications management;Direct supervision/assist for financial management;Assist for transportation;Help with stairs or ramp for entrance;Supervision due to cognitive status   Can travel by private vehicle        Equipment Recommendations None recommended by PT  Recommendations for Other Services       Functional Status Assessment Patient has had a recent decline in their functional status and demonstrates the ability to make significant improvements in function in a reasonable and predictable amount of time.     Precautions / Restrictions Precautions Precautions: Fall;Sternal Precaution Booklet Issued: Yes (comment) (during prior admission) Precaution Comments: watch HR and BP Restrictions Weight Bearing Restrictions Per Provider Order: Yes Other Position/Activity Restrictions: sternal precautions from AVR with CABG and pacemaker placement in 06/2023 - Dr. Izora Ribas verbally confirmed 12/22 that pt still needs to follow his sternal precautions  Mobility  Bed Mobility Overal bed mobility: Needs Assistance Bed Mobility: Supine to Sit, Sit to Supine     Supine to sit:  Supervision, HOB elevated Sit to supine: Supervision, HOB elevated   General bed mobility comments: Increased time and pt needing cues to maintain his sternal precautions when mobilizing, especially when scooting anteriorly on EOB as pt is not very good at being compliant with them    Transfers Overall transfer level: Needs assistance Equipment used: None Transfers: Sit to/from Stand Sit to Stand: Min assist           General transfer comment: Pt needs repeated cues to scoot to edge, widen his feet placement, place hands on his lap (he keeps trying to push up from the bed, which breaks his sternal precautions), and bring his "nose over toes" while rocking his trunk to gain momentum to stand. Pt often having LOB posteriorly during transfer, likely due to not bringing his "nose over toes" as cued. MinA for balance    Ambulation/Gait Ambulation/Gait assistance: Contact guard assist, Min assist Gait Distance (Feet): 50 Feet (x2 bouts of ~20 ft > ~50 ft) Assistive device: None Gait Pattern/deviations: Step-through pattern, Decreased step length - right, Decreased step length - left, Decreased stride length, Narrow base of support Gait velocity: reduced Gait velocity interpretation: <1.8 ft/sec, indicate of risk for recurrent falls   General Gait Details: Pt ambulated around the room without UE support. He displays a narrow BOS, resulting in him intermittently staggering and losing his balance, minA intermittently to recover. Cues provided to widen BOS. Pt indicating when he needed to sit but could not identify why until PT questioned him further, which he then admitted to feeling fatigued and mildly lightheaded. See BP measures in General Comments below.  Stairs            Wheelchair Mobility     Tilt Bed    Modified Rankin (Stroke Patients Only)       Balance Overall balance assessment: Mild deficits observed, not formally tested                                            Pertinent Vitals/Pain Pain Assessment Pain Assessment: Faces Faces Pain Scale: No hurt Pain Intervention(s): Monitored during session    Home Living Family/patient expects to be discharged to:: Private residence Living Arrangements: Spouse/significant other Available Help at Discharge: Family;Available 24 hours/day Type of Home: House Home Access: Stairs to enter;Other (comment)   Entrance Stairs-Number of Steps: 2-3 through garage; has stair lift   Home Layout: One level Home Equipment: Shower seat;Other (comment);Cane - single Librarian, academic (2 wheels);Rollator (4 wheels) (stair lift)      Prior Function Prior Level of Function : Independent/Modified Independent;Needs assist;History of Falls (last six months)             Mobility Comments: Pt and family reported to PT that the pt does not use an AD when ambulating. Per OT Eval -Prior to cardiac surgeries in 06/2023, pt was Independent to Mod I  for funcitonal mobility with intermittent use of SPC. Since discharging home in 06/2023, pt has been receiving HH PT and ambulating with a RW in the home with Mod I. Pt had three falls in the past week. Per MD note, falls happened on different days and various situations. However, per pt and family reports, all falls haapened on  the same day a week ago Friday and all happen while pt was standing at the commode with pt now sitting for urination since that time. ADLs Comments: Prior to cardiac surgeries in 06/2023, pt was Independent to Mod I with ADLs and IADLs. Pt received HH OT follow acute discharge in 06/2023 and has now completed [facility] OT. Just prior to this admission, pt completing ADLs with Independent to Supervision for safety.     Extremity/Trunk Assessment   Upper Extremity Assessment Upper Extremity Assessment: Defer to OT evaluation    Lower Extremity Assessment Lower Extremity Assessment: Generalized weakness    Cervical / Trunk Assessment Cervical  / Trunk Assessment: Kyphotic (mild)  Communication   Communication Communication: Hearing impairment  Cognition Arousal: Alert Behavior During Therapy: WFL for tasks assessed/performed Overall Cognitive Status: Impaired/Different from baseline (Mild confusion suspect due to medication per RN and family as pt was given morphine overnight) Area of Impairment: Attention, Memory, Safety/judgement, Awareness, Problem solving                   Current Attention Level: Selective Memory: Decreased short-term memory, Decreased recall of precautions   Safety/Judgement: Decreased awareness of safety, Decreased awareness of deficits Awareness: Emergent Problem Solving: Slow processing, Decreased initiation, Requires verbal cues General Comments: Pt pleasant throughout session and able to follow 1 and 2-step instructions consitently, intermittently needing cues repeated (HOH) and extra time to process cues. Pt with noted mild cognitive decline this day with RN and family reporting confusion started with administration of morphine overnight and suspected confusion is due to medication. Pt fidgeting with O2 sensor line throughout session        General Comments General comments (skin integrity, edema, etc.): monitor alarmed "V Tach" at one point and there did seem to be a few second run of an arrhythmia there but pt did not seem to be symptomatic, notified RN and MD. MD assessed Tele and reported it looked okay; BP: 132/72 sitting EOB, 125/97 (108) standing, 105/64 (77) standing ~3 min (indicated he needed to sit but could not identify why until PT questioned him further, which he then admitted to feeling fatigued and mildly lightheaded), 122/85 after resting sitting EOB several minutes, 113/73 sitting after ambulating around the room again (pt seemed more pale while ambulating); educated pt and family on changing positions slowly, sign/symptoms of orthostatic hypotension, returning to sit or supine as  needed if symptoms do not improve, AROM of extremities to try to improve his BP - they verbalized understanding    Exercises     Assessment/Plan    PT Assessment Patient needs continued PT services  PT Problem List Decreased strength;Decreased activity tolerance;Decreased balance;Decreased mobility;Decreased cognition;Decreased safety awareness;Decreased knowledge of precautions;Cardiopulmonary status limiting activity       PT Treatment Interventions DME instruction;Gait training;Stair training;Functional mobility training;Therapeutic exercise;Therapeutic activities;Balance training;Neuromuscular re-education;Cognitive remediation;Patient/family education    PT Goals (Current goals can be found in the Care Plan section)  Acute Rehab PT Goals Patient Stated Goal: to improve PT Goal Formulation: With patient/family Time For Goal Achievement: 09/01/23 Potential to Achieve Goals: Good    Frequency Min 1X/week     Co-evaluation               AM-PAC PT "6 Clicks" Mobility  Outcome Measure Help needed turning from your back to your side while in a flat bed without using bedrails?: A Little Help needed moving from lying on your back to sitting on the side of a flat bed without  using bedrails?: A Little Help needed moving to and from a bed to a chair (including a wheelchair)?: A Little Help needed standing up from a chair using your arms (e.g., wheelchair or bedside chair)?: A Little Help needed to walk in hospital room?: A Little Help needed climbing 3-5 steps with a railing? : A Little 6 Click Score: 18    End of Session Equipment Utilized During Treatment: Gait belt Activity Tolerance: Patient tolerated treatment well;Other (comment) (limited by BP) Patient left: in bed;with call bell/phone within reach;with bed alarm set;with family/visitor present Nurse Communication: Mobility status;Other (comment) (HR and BP and poor pt compliance to sternal precautions - notified MD  also) PT Visit Diagnosis: Unsteadiness on feet (R26.81);Other abnormalities of gait and mobility (R26.89);Muscle weakness (generalized) (M62.81);History of falling (Z91.81);Repeated falls (R29.6);Difficulty in walking, not elsewhere classified (R26.2);Dizziness and giddiness (R42)    Time: 1610-9604 PT Time Calculation (min) (ACUTE ONLY): 25 min   Charges:   PT Evaluation $PT Eval Moderate Complexity: 1 Mod PT Treatments $Therapeutic Activity: 8-22 mins PT General Charges $$ ACUTE PT VISIT: 1 Visit         Virgil Benedict, PT, DPT Acute Rehabilitation Services  Office: [phone]   Bettina Gavia 08/18/2023, 2:09 PM

## 2023-08-18 NOTE — Plan of Care (Signed)
   Problem: Education: Goal: Knowledge of General Education information will improve Description: Including pain rating scale, medication(s)/side effects and non-pharmacologic comfort measures Outcome: Progressing   Problem: Health Behavior/Discharge Planning: Goal: Ability to manage health-related needs will improve Outcome: Progressing   Problem: Clinical Measurements: Goal: Ability to maintain clinical measurements within normal limits will improve Outcome: Progressing Goal: Will remain free from infection Outcome: Progressing Goal: Diagnostic test results will improve Outcome: Progressing Goal: Respiratory complications will improve Outcome: Progressing Goal: Cardiovascular complication will be avoided Outcome: Progressing   Problem: Activity: Goal: Risk for activity intolerance will decrease Outcome: Progressing   Problem: Nutrition: Goal: Adequate nutrition will be maintained Outcome: Progressing   Problem: Coping: Goal: Level of anxiety will decrease Outcome: Progressing   Problem: Elimination: Goal: Will not experience complications related to bowel motility Outcome: Progressing Goal: Will not experience complications related to urinary retention Outcome: Progressing   Problem: Pain Management: Goal: General experience of comfort will improve Outcome: Progressing   Problem: Safety: Goal: Ability to remain free from injury will improve Outcome: Progressing   Problem: Skin Integrity: Goal: Risk for impaired skin integrity will decrease Outcome: Progressing   Problem: Education: Goal: Understanding of cardiac disease, CV risk reduction, and recovery process will improve Outcome: Progressing Goal: Individualized Educational Video(s) Outcome: Progressing   Problem: Activity: Goal: Ability to tolerate increased activity will improve Outcome: Progressing   Problem: Cardiac: Goal: Ability to achieve and maintain adequate cardiovascular perfusion will  improve Outcome: Progressing   Problem: Health Behavior/Discharge Planning: Goal: Ability to safely manage health-related needs after discharge will improve Outcome: Progressing

## 2023-08-18 NOTE — Plan of Care (Signed)
  Problem: Activity: Goal: Risk for activity intolerance will decrease Outcome: Progressing   

## 2023-08-18 NOTE — Progress Notes (Signed)
Note from Bettina Gavia, PT with Dr Izora Ribas also in conversation.   Hello, I am just passing along this pt's information in regards to his vitals during PT. The monitor alarmed "V Tach" at one point and there did seem to be a few second run of an arrhythmia there but pt did not seem to be symptomatic. His BP was stable for OT earlier but it was the following for me: 132/72 sitting EOB, 125/97 (108) standing, 105/64 (77) standing ~3 min (pt expressed need to sit but would not tell me why until I bothered him for a while and he admitted feeling tired and a little lightheaded), 122/85 after resting sitting EOB several minutes, 113/73 sitting after ambulating around the room again (pt seemed more pale while ambulating).

## 2023-08-18 NOTE — Progress Notes (Signed)
*  PRELIMINARY RESULTS* Echocardiogram Limited 2-D Echocardiogram  has been performed.  Bruce Nunez 08/18/2023, 4:11 PM

## 2023-08-18 NOTE — Progress Notes (Signed)
Progress Note  Patient Name: Bruce Nunez Date of Encounter: 08/18/2023 Primary Cardiologist: Norman Herrlich, MD   Subjective   Ms. 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 Despite these interventions, the patient has experienced three falls in the past week, raising concerns of orthostatic hypotension.  The patient was previously on high dose AV nodal agents for postoperative atrial fibrillation, but these were discontinued due to concerns of hypotension. A device interrogation in December 2024 showed no evidence of atrial fibrillation, complete heart block, or failure to capture.  The patient's heart function was preserved with normal biventricular function and no paravalvular leak as per an echocardiogram performed in November 2024. The prosthetic mean gradient was six with a normal acceleration time.  The patient reports three episodes of feeling like he was having a stroke, a sensation he is familiar with due to a history of stroke. These episodes occur while walking or performing activities such as going to the bathroom or shaving. Despite these episodes, the patient denies experiencing chest pain, breathing issues, or palpitations.  The patient's heart rate was noted to be 116 on exam, indicative of sinus tachycardia. The patient is unsure of his current medication regimen, and attempts to contact his spouse for clarification have been unsuccessful. The patient was recently started on Jardiance for diabetes and metoprolol was restarted. The patient is also on Eliquis for atrial fibrillation and Synthroid.  Vital Signs    Vitals:   08/18/23 0800 08/18/23 0812 08/18/23 0853 08/18/23 0938   BP:  (!) 142/86  (!) 159/64  Pulse:  (!) 118  (!) 116  Resp:  20    Temp:  99.5 F (37.5 C)    TempSrc:  Oral    SpO2: 92% 100% 100%    No intake or output data in the 24 hours ending 08/18/23 1025 There were no vitals filed for this visit.  Physical Exam   GEN: Elderly male Neck: No JVD Cardiac: Regular tachycardia, no rubs, or gallops. S3 noted, no aortic regurgitation Respiratory: Clear to auscultation bilaterally. GI: Soft, nontender, non-distended  MS: No edema  Labs   Telemetry: Sinus tachycardia   Chemistry Recent Labs  Lab 08/18/23 0356  NA 134*  K 4.2  CL 104  CO2 22  GLUCOSE 103*  BUN 15  CREATININE 0.89  CALCIUM 9.0  GFRNONAA >60  ANIONGAP 8     Hematology Recent Labs  Lab 08/18/23 0356  WBC 9.0  RBC 4.22  HGB 10.6*  HCT 33.9*  MCV 80.3  MCH 25.1*  MCHC 31.3  RDW 13.8  PLT 195    Cardiac EnzymesNo results for input(s): "TROPONINI" in the last 168 hours. No results for input(s): "TROPIPOC" in the last 168 hours.   BNPNo results for input(s): "BNP", "PROBNP" in the last 168 hours.   DDimer No results for input(s): "DDIMER" in the last 168 hours.   Cardiac Studies   Cardiac Studies & Procedures   CARDIAC CATHETERIZATION  CARDIAC CATHETERIZATION 05/03/2023  Narrative   Prox RCA to Mid RCA lesion is 50% stenosed.   Mid LM to Dist LM lesion is 30% stenosed.   Prox Cx to Dist Cx lesion is 50% stenosed.   Prox LAD to Mid LAD lesion is 50%  stenosed.   1st Diag lesion is 60% stenosed.   There is severe (4+) aortic regurgitation.  1.  Moderate multivessel coronary artery disease with mild distal left main plaquing of 30%, moderate diffuse LAD stenosis of 50%, mild to moderate mid circumflex stenosis of 40 to 50%, and moderate mid RCA stenosis of 50%. 2.  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  Severe right subclavian tortuosity makes cardiac catheterization very difficult from right radial access.  If  patient requires future cardiac catheterization, avoid right radial access.  Recommendations: Heart team review, cardiac CTA (TAVR protocol) to evaluate if there is enough aortic valve calcification to perform TAVR as aortic insufficiency appears to be the primary valve lesion.  The patient has a very wide pulse pressure and angiographically has severe AI.  Findings Coronary Findings Diagnostic  Dominance: Right  Left Main Mid LM to Dist LM lesion is 30% stenosed.  Left Anterior Descending The LAD has moderate diffuse 50% stenosis throughout the proximal and mid vessel.  The first diagonal has a 50 to 60% ostial stenosis. Prox LAD to Mid LAD lesion is 50% stenosed.  First Diagonal Branch 1st Diag lesion is 60% stenosed.  Left Circumflex There is mild diffuse disease throughout the vessel. Prox Cx to Dist Cx lesion is 50% stenosed. The lesion is moderately calcified.  Right Coronary Artery There is mild diffuse disease throughout the vessel. Dominant vessel with no high-grade CAD.  There is moderate diffuse calcification present.  The mid RCA has a 50% stenosis.  The PDA and PLA branches are patent with no significant stenoses.  The distal vessel has mild diffuse plaquing with no stenosis. Prox RCA to Mid RCA lesion is 50% stenosed. The lesion is moderately calcified.  Intervention  No interventions have been documented.    ECHOCARDIOGRAM  ECHOCARDIOGRAM LIMITED 07/16/2023  Narrative ECHOCARDIOGRAM LIMITED REPORT    Patient Name:   Bruce Nunez Date of Exam: 07/16/2023 Medical Rec #:  [id number]     Height:       67.0 in Accession #:    [id number]    Weight:       151.5 lb Date of Birth:  1941-06-25      BSA:          1.797 m Patient Age:    82 years      BP:           125/67 mmHg Patient Gender: M             HR:           93 bpm. Exam Location:  Inpatient  Procedure: Limited Echo, Color Doppler and Cardiac Doppler  Indications:     s/p AVR  History:         Patient  has prior history of Echocardiogram examinations, most recent 07/06/2023. Risk Factors:Hypertension, Diabetes and Dyslipidemia. 07/06/23 CABG and AVR with 21mm Inspiris Resilia Bioprosthetic. Aortic Valve: 21 mm Inspiris Resilia bioprosthetic valve is present in the aortic position.  Sonographer:     Irving Burton Senior RDCS Referring Phys:  [id number] Sheilah Pigeon Diagnosing Phys: Armanda Magic MD  IMPRESSIONS   1. Left ventricular ejection fraction, by estimation, is 60 to 65%. The left ventricle has normal function. The left ventricle has no regional wall motion abnormalities. Left ventricular diastolic function could not be evaluated. 2. Right ventricular systolic function is normal. The right ventricular size is normal. Tricuspid regurgitation signal is inadequate for assessing PA pressure. 3. The mitral  valve is normal in structure. No evidence of mitral valve regurgitation. No evidence of mitral stenosis. 4. The aortic valve has been repaired/replaced. Aortic valve regurgitation is not visualized. No aortic stenosis is present. There is a 21 mm Inspiris Resilia bioprosthetic valve present in the aortic position. Aortic valve area, by VTI measures 2.26 cm. Aortic valve mean gradient measures 6.0 mmHg. Aortic valve Vmax measures 1.68 m/s. 5. The inferior vena cava is normal in size with greater than 50% respiratory variability, suggesting right atrial pressure of 3 mmHg. 6. Trivial pericardial effusion is present. The pericardial effusion is surrounding the apex.  FINDINGS Left Ventricle: Left ventricular ejection fraction, by estimation, is 60 to 65%. The left ventricle has normal function. The left ventricle has no regional wall motion abnormalities. The left ventricular internal cavity size was normal in size. There is no left ventricular hypertrophy. Left ventricular diastolic function could not be evaluated.  Right Ventricle: The right ventricular size is normal. No increase in right  ventricular wall thickness. Right ventricular systolic function is normal. Tricuspid regurgitation signal is inadequate for assessing PA pressure.  Left Atrium: Left atrial size was normal in size.  Right Atrium: Right atrial size was normal in size. Prominent Chiari network.  Pericardium: Trivial pericardial effusion is present. The pericardial effusion is surrounding the apex.  Mitral Valve: The mitral valve is normal in structure. Mild to moderate mitral annular calcification. No evidence of mitral valve stenosis.  Tricuspid Valve: The tricuspid valve is normal in structure. Tricuspid valve regurgitation is not demonstrated. No evidence of tricuspid stenosis.  Aortic Valve: The aortic valve has been repaired/replaced. Aortic valve regurgitation is not visualized. No aortic stenosis is present. Aortic valve mean gradient measures 6.0 mmHg. Aortic valve peak gradient measures 11.3 mmHg. Aortic valve area, by VTI measures 2.26 cm. There is a 21 mm Inspiris Resilia bioprosthetic valve present in the aortic position.  Pulmonic Valve: The pulmonic valve was normal in structure. Pulmonic valve regurgitation is mild. No evidence of pulmonic stenosis.  Aorta: The aortic root is normal in size and structure.  Venous: The inferior vena cava is normal in size with greater than 50% respiratory variability, suggesting right atrial pressure of 3 mmHg.  IAS/Shunts: No atrial level shunt detected by color flow Doppler.  Additional Comments: Spectral Doppler performed. Color Doppler performed.  LEFT VENTRICLE PLAX 2D LVOT diam:     2.00 cm LV SV:         56 LV SV Index:   31 LVOT Area:     3.14 cm   RIGHT VENTRICLE RV S prime:     8.81 cm/s TAPSE (M-mode): 1.4 cm  AORTIC VALVE AV Area (Vmax):    2.36 cm AV Area (Vmean):   2.30 cm AV Area (VTI):     2.26 cm AV Vmax:           168.00 cm/s AV Vmean:          111.000 cm/s AV VTI:            0.249 m AV Peak Grad:      11.3 mmHg AV Mean  Grad:      6.0 mmHg LVOT Vmax:         126.00 cm/s LVOT Vmean:        81.200 cm/s LVOT VTI:          0.179 m LVOT/AV VTI ratio: 0.72   SHUNTS Systemic VTI:  0.18 m Systemic Diam: 2.00 cm  Armanda Magic MD  Electronically signed by Armanda Magic MD Signature Date/Time: 07/16/2023/1:37:35 PM    Final (Updated)  TEE  ECHO INTRAOPERATIVE TEE 07/05/2023  Narrative *INTRAOPERATIVE TRANSESOPHAGEAL REPORT *    Patient Name:   Bruce Nunez  Date of Exam: 07/05/2023 Medical Rec #:  [id number]      Height:       67.0 in Accession #:    [id number]     Weight:       146.3 lb Date of Birth:  Apr 19, 1941       BSA:          1.77 m Patient Age:    82 years       BP:           161/54 mmHg Patient Gender: M              HR:           81 bpm. Exam Location:  Anesthesiology  Transesophogeal exam was perform intraoperatively during surgical procedure. Patient was closely monitored under general anesthesia during the entirety of examination.  Indications:     I35.1 Nonrheumatic aortic (valve) insufficiency Performing Phys: [id number] [name] Diagnosing Phys: Earl Lites Stoltzfus  Complications: No known complications during this procedure. POST-OP IMPRESSIONS _ Left Ventricle: The left ventricle is unchanged from pre-bypass. _ Right Ventricle: The right ventricle appears unchanged from pre-bypass. _ Aorta: The aorta appears unchanged from pre-bypass. _ Left Atrial Appendage: The left atrial appendage appears unchanged from pre-bypass. _ Aortic Valve: A bioprosthetic valve was placed, leaflets are freely mobile Size; 21mm. The gradient recorded across the prosthetic valve is within the expected range. No perivalvular leak noted. Mean PG _ Mitral Valve: The mitral valve appears unchanged from pre-bypass. _ Tricuspid Valve: The tricuspid valve appears unchanged from pre-bypass. _ Pulmonic Valve: The pulmonic valve appears unchanged from pre-bypass. _ Interatrial Septum: The interatrial  septum appears unchanged from pre-bypass. _ Pericardium: The pericardium appears unchanged from pre-bypass. _ Comments: S/P AVR CABG. No new or worsening wall motion or valvular abnormalities post procedure. 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 Seated appropriately without rock or leak. Gradients WNL.  PRE-OP FINDINGS Left Ventricle: The left ventricle has normal systolic function, with an ejection fraction of 60-65%. The cavity size was normal. Left ventricular diastolic parameters are consistent with Grade I diastolic dysfunction (impaired relaxation).   Right Ventricle: The right ventricle has normal systolic function. The cavity was normal. There is no increase in right ventricular wall thickness. Right ventricular systolic pressure is normal.  Left Atrium: Left atrial size was normal in size. No left atrial/left atrial appendage thrombus was detected. Left atrial appendage velocity is normal at greater than 40 cm/s.  Right Atrium: Right atrial size was normal in size.  Interatrial Septum: No atrial level shunt detected by color flow Doppler. The interatrial septum appears to be lipomatous. There is no evidence of a patent foramen ovale.  Pericardium: There is no evidence of pericardial effusion. There is no pleural effusion.  Mitral Valve: The mitral valve is normal in structure. Mitral valve regurgitation is trivial by color flow Doppler. There is no evidence of mitral valve vegetation. There is No evidence of mitral stenosis.  Tricuspid Valve: The tricuspid valve was normal in structure. Tricuspid valve regurgitation is trivial by color flow Doppler. No evidence of tricuspid stenosis is present. There is no evidence of tricuspid valve vegetation.  Aortic Valve: The aortic valve is tricuspid. Aortic valve regurgitation is moderate to severe.  The jet is eccentric anteriorly directed. There is mild stenosis of the aortic valve, with a calculated valve area of 1.40  cm. AI PHT , VC .7cm, diastolic reversal descending thoracic aorta, EROA PISA .32cm2.  Pulmonic Valve: The pulmonic valve was normal in structure, with normal. No evidence of pumonic stenosis. Pulmonic valve regurgitation is mild by color flow Doppler.   Aorta: The aortic root and ascending aorta are normal in size and structure.  Pulmonary Artery: Theone Murdoch catheter present on the right. The pulmonary artery is of normal size.  Shunts: There is no evidence of an atrial septal defect.  +--------------+--------++ LEFT VENTRICLE         +--------------+--------++ PLAX 2D                +--------------+--------++ LVIDd:        4.80 cm  +--------------+--------++ LVIDs:        3.00 cm  +--------------+--------++ LVOT diam:    2.00 cm  +--------------+--------++ LV SV:        73 ml    +--------------+--------++ LV SV Index:  40.86    +--------------+--------++ LVOT Area:    3.14 cm +--------------+--------++                        +--------------+--------++  +------------------+------------++ AORTIC VALVE                   +------------------+------------++ AV Area (Vmax):   1.34 cm     +------------------+------------++ AV Area (Vmean):  1.36 cm     +------------------+------------++ AV Area (VTI):    1.40 cm     +------------------+------------++ AV Vmax:          279.00 cm/s  +------------------+------------++ AV Vmean:         193.000 cm/s +------------------+------------++ AV VTI:           0.601 m      +------------------+------------++ AV Peak Grad:     31.1 mmHg    +------------------+------------++ AV Mean Grad:     17.0 mmHg    +------------------+------------++ LVOT Vmax:        119.00 cm/s  +------------------+------------++ LVOT Vmean:       83.800 cm/s  +------------------+------------++ LVOT VTI:         0.267 m       +------------------+------------++ LVOT/AV VTI ratio:0.44         +------------------+------------++  +-------------+-------++ AORTA                +-------------+-------++ Ao Root diam:3.10 cm +-------------+-------++ Ao STJ diam: 2.4 cm  +-------------+-------++ Ao Asc diam: 3.20 cm +-------------+-------++  +--------------+----------++ MITRAL VALVE              +--------------+-------+ +--------------+----------++  SHUNTS                MV Area (PHT):3.63 cm    +--------------+-------+ +--------------+----------++  Systemic VTI: 0.27 m  MV Peak grad: 3.0 mmHg    +--------------+-------+ +--------------+----------++  Systemic Diam:2.00 cm MV Mean grad: 1.0 mmHg    +--------------+-------+ +--------------+----------++ MV Vmax:      0.86 m/s   +--------------+----------++ MV Vmean:     53.1 cm/s  +--------------+----------++ MV PHT:       60.61 msec +--------------+----------++ MV Decel Time:209 msec   +--------------+----------++ +--------------+----------++ MV E velocity:63.50 cm/s +--------------+----------++ MV A velocity:80.40 cm/s +--------------+----------++ MV E/A ratio: 0.79       +--------------+----------++   Hester Mates Electronically signed by Hester Mates Signature Date/Time: 07/06/2023/3:06:06  PM    Final  MONITORS  LONG TERM MONITOR (3-14 DAYS) 04/23/2022  Narrative Patch Wear Time:  13 days and 22 hours (2023-08-02T11:41:54-0400 to 2023-08-16T10:11:23-0400)  Patient had a min HR of 52 bpm, max HR of 122 bpm, and avg HR of 68 bpm. Predominant underlying rhythm was Sinus Rhythm.  There were no sinus pauses of 3 seconds or greater and no episodes of second or third-degree AV nodal block.  There were no triggered or diary events.  4 Supraventricular Tachycardia runs occurred, the run with the fastest interval lasting 4 beats with a max rate of 122 bpm, the  longest lasting 6 beats with an avg rate of 94 bpm. Isolated SVEs were rare (<1.0%), SVE Couplets were rare (<1.0%), and SVE Triplets were rare (<1.0%).  Isolated VEs were rare (<1.0%), and no VE Couplets or VE Triplets were present. Ventricular Trigeminy was present.  CT SCANS  CT CORONARY MORPH W/CTA COR W/SCORE 04/23/2023  Addendum 05/09/2023 10:39 AM ADDENDUM REPORT: 05/09/2023 10:37  EXAM: OVER-READ INTERPRETATION  CT CHEST  The following report is an over-read performed by radiologist Dr. Curly Shores [facility] on 05/09/2023. This over-read does not include interpretation of cardiac or coronary anatomy or pathology. The coronary CTA interpretation by the cardiologist is attached.  COMPARISON:  None.  FINDINGS: Cardiovascular:  See findings discussed in the body of the report.  Mediastinum/Nodes: No suspicious adenopathy identified. Imaged mediastinal structures are unremarkable.  Lungs/Pleura: Imaged lungs are clear. No pleural effusion or pneumothorax.  Upper Abdomen: No acute abnormality.  Musculoskeletal: No chest wall abnormality. There are thoracic degenerative changes.  IMPRESSION: No significant extracardiac incidental findings identified.   Electronically Signed By: Layla Maw M.D. On: 05/09/2023 10:37  Narrative CLINICAL DATA:  Chest pain  EXAM: Cardiac/Coronary CTA  TECHNIQUE: A non-contrast, gated CT scan was obtained with axial slices of 3 mm through the heart for calcium scoring. Calcium scoring was performed using the Agatston method. A 120 kV prospective, gated, contrast cardiac scan was obtained. Gantry rotation speed was 250 msecs and collimation was 0.6 mm. Two sublingual nitroglycerin tablets (0.8 mg) were given. The 3D data set was reconstructed in 5% intervals of the 35-75% of the R-R cycle. Diastolic phases were analyzed on a dedicated workstation using MPR, MIP, and VRT modes. The patient received 95 cc of  contrast.  FINDINGS: Image quality: Fair. Significant blooming artifact, respiratory motion.  Noise artifact is: Mild  Coronary Arteries:  Normal coronary origin.  Right dominance.  Left main: The left main is a large caliber vessel with a normal take off from the left coronary cusp that bifurcates to form a left anterior descending artery and a left circumflex artery. There is mild calcified plaque in the distal LM with associated stenosis of 25-49%.  Left anterior descending artery: The LAD gives off 2 patent diagonal branches. There is diffuse mild to moderate calcified plaque in the proximal and mid LAD with associated stenosis of 25-49% but at some places could be > 50%. This may be overestimated in the setting of blooming artifact.  Left circumflex artery: The LCX is non-dominant and gives off 1 patent obtuse marginal branch. There is diffuse calcified plaque in the ostial, proximal and mid LCx with associated stenosis of 25-49% with a possible moderate to severe focal stenosis of at least 50-69% and possibly >70% in the mid LCx.  Right coronary artery: The RCA is dominant with normal take off from the right coronary cusp. The RCA terminates as  a PDA and right posterolateral branch without evidence of plaque or stenosis. There is mild scattered calcified plaque in the throughout RCA with associated stenosis of 25-49%. There is moderate calcified plaque in the mid RCA with associated stenosis of 50-69%.  Right Atrium: Right atrial size is within normal limits.  Right Ventricle: The right ventricular cavity is within normal limits.  Left Atrium: Left atrial size is normal in size with no left atrial appendage filling defect.  Left Ventricle: The ventricular cavity size is within normal limits.  Pulmonary arteries: Normal in size.  Pulmonary veins: Normal pulmonary venous drainage.  Pericardium: Normal thickness without significant effusion or calcium  present.  Cardiac valves: The aortic valve is trileaflet without significant calcification. The mitral valve is normal without significant calcification.  Aorta: Normal caliber with scattered calcifications.  Extra-cardiac findings: See attached radiology report for non-cardiac structures.  IMPRESSION: 1. Coronary calcium score of 812. This was 60th percentile for age-, sex, and race-matched controls.  2. Total plaque volume 663 mm3 which is 40th percentile for age- and sex-matched controls (calcified plaque 88 mm3; non-calcified plaque 575 mm3). TPV is severe.  3. Normal coronary origin with right dominance.  4. Mild to moderate atherosclerosis. 25-49% distal LM/proximal to mid LAD. 25-49% ostial/proximal/mid LCx and diffusely throughout the RCA. Possible calcified plaque >50% in the mid RCA/LCx and LAD.  5. Recommend preventive therapy and risk factor modification.  6. This study has been submitted for FFR analysis.  RECOMMENDATIONS: 1. CAD-RADS 0: No evidence of CAD (0%). Consider non-atherosclerotic causes of chest pain.  2. CAD-RADS 1: Minimal non-obstructive CAD (0-24%). Consider non-atherosclerotic causes of chest pain. Consider preventive therapy and risk factor modification.  3. CAD-RADS 2: Mild non-obstructive CAD (25-49%). Consider non-atherosclerotic causes of chest pain. Consider preventive therapy and risk factor modification.  4. CAD-RADS 3: Moderate stenosis. Consider symptom-guided anti-ischemic pharmacotherapy as well as risk factor modification per guideline directed care. Additional analysis with CT FFR will be submitted.  5. CAD-RADS 4: Severe stenosis. (70-99% or > 50% left main). Cardiac catheterization or CT FFR is recommended. Consider symptom-guided anti-ischemic pharmacotherapy as well as risk factor modification per guideline directed care. Invasive coronary angiography recommended with revascularization per published  guideline statements.  6. CAD-RADS 5: Total coronary occlusion (100%). Consider cardiac catheterization or viability assessment. Consider symptom-guided anti-ischemic pharmacotherapy as well as risk factor modification per guideline directed care.  7. CAD-RADS N: Non-diagnostic study. Obstructive CAD can't be excluded. Alternative evaluation is recommended.  Armanda Magic, MD  Electronically Signed: By: Armanda Magic M.D. On: 04/30/2023 19:47  CARDIAC MRI  MR CARDIAC MORPHOLOGY W WO CONTRAST 03/21/2022  Narrative CLINICAL DATA:  Evaluate aortic regurgitation  EXAM: CARDIAC MRI  TECHNIQUE: The patient was scanned on a 1.5 Tesla Siemens magnet. A dedicated cardiac coil was used. Functional imaging was done using Fiesta sequences. 2,3, and 4 chamber views were done to assess for RWMA's. Modified Simpson's rule using a short axis stack was used to calculate an ejection fraction on a dedicated work [employer]. The patient received 10 cc of Gadavist. After 10 minutes inversion recovery sequences were used to assess for infiltration and scar tissue. Phase contrast velocity mapping performed.  CONTRAST:  10 cc  of Gadavist  FINDINGS: 1. Normal left ventricular size, thickness and systolic function (LVEF = 54%). There are no regional wall motion abnormalities.  There is no late gadolinium enhancement in the left ventricular myocardium.  LVEDV: 123 ml  LVESV: 57 ml  SV:  66 ml  Myocardial mass: 86 g  2. Normal right ventricular size, thickness and systolic function (RVEF = 63%). There are no regional wall motion abnormalities.  3.  Normal left and right atrial size.  4. Normal size of the aortic root, ascending aorta and pulmonary artery.  5. Moderate aortic regurgitation. Aortic valve measurements: regurgitant fraction = 37%, regurgitant volume = 24 ml  Mild tricuspid regurgitation.  6.  Normal pericardium.  No pericardial  effusion.  IMPRESSION: 1.  Normal Biventricular size and function.  LVEF 54%, RVEF 63%.  2.  Moderate aortic valve regurgitation.  Regurgitant fraction 37%.  3.  No evidence of late gadolinium enhancement or infiltration.  4.  Mild tricuspid regurgitation.  Debbe Odea   Electronically Signed By: Debbe Odea M.D. 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 No evidence of cardiac syncope or device malfunction. Suspected non-cardiac syncope, likely vasovagal. Differential includes orthostatic hypotension and inappropriate sinus tachycardia potentially due to deconditioning post-surgery. Given multiple falls, a limited echocardiogram will be repeated to evaluate valve function and aortic valve regurgitation. Orthostatic vital signs will be obtained to rule out orthostatic hypotension. If all tests are negative, discharge is planned for Monday to ensure safe discharge and contact with his spouse. - Order limited echocardiogram to evaluate valve function and aortic valve regurgitation - Obtain orthostatic vital signs - TSH and CBC WNL;  - Continue Eliquis for atrial fibrillation - Continue Jardiance for diabetes - Continue Synthroid - Monitor response to metoprolol (he has sinus tachycardia that may be due to deconditioning) - Plan for potential discharge on Monday if all tests are negative and no further syncope occurs - CHB- resolved with [employer] PPM interrogation   Atrial Fibrillation Postoperative atrial fibrillation managed with Eliquis. Heart rates currently elevated at 116 bpm but in sinus. Metoprolol restarted - Continue Eliquis - Monitor heart rate response to metoprolol   Diabetes Mellitus Diabetes management with recent initiation of Jardiance  and glimepiride. Benefits include improved glycemic control and potential cardiovascular benefits; risks include hypoglycemia and genitourinary infections. - Continue Jardiance - Continue glimepiride  Hypothyroidism - Continue Synthroid  Follow-up - Discharge on Monday if all tests are negative and no further syncope occurs - Contact spouse for medication clarification. - PT and OT eval - Full Code        For questions or updates, please contact [facility] Please consult [email or web address] for contact info under Cardiology/STEMI.      Riley Lam, MD FASE [facility] Cardiologist [facility]  [address] [place], [address] [place], [place] [address] [phone]  10:25 AM

## 2023-08-19 ENCOUNTER — Other Ambulatory Visit (HOSPITAL_COMMUNITY): Payer: Self-pay

## 2023-08-19 ENCOUNTER — Telehealth (HOSPITAL_COMMUNITY): Payer: Self-pay | Admitting: Pharmacy Technician

## 2023-08-19 DIAGNOSIS — R296 Repeated falls: Secondary | ICD-10-CM | POA: Diagnosis not present

## 2023-08-19 DIAGNOSIS — R55 Syncope and collapse: Secondary | ICD-10-CM | POA: Diagnosis not present

## 2023-08-19 DIAGNOSIS — E119 Type 2 diabetes mellitus without complications: Secondary | ICD-10-CM | POA: Diagnosis not present

## 2023-08-19 DIAGNOSIS — I480 Paroxysmal atrial fibrillation: Secondary | ICD-10-CM | POA: Diagnosis not present

## 2023-08-19 DIAGNOSIS — Z7901 Long term (current) use of anticoagulants: Secondary | ICD-10-CM | POA: Diagnosis not present

## 2023-08-19 DIAGNOSIS — I442 Atrioventricular block, complete: Secondary | ICD-10-CM | POA: Diagnosis not present

## 2023-08-19 DIAGNOSIS — N400 Enlarged prostate without lower urinary tract symptoms: Secondary | ICD-10-CM | POA: Diagnosis not present

## 2023-08-19 DIAGNOSIS — J45909 Unspecified asthma, uncomplicated: Secondary | ICD-10-CM | POA: Diagnosis not present

## 2023-08-19 DIAGNOSIS — Z79899 Other long term (current) drug therapy: Secondary | ICD-10-CM | POA: Diagnosis not present

## 2023-08-19 DIAGNOSIS — I2510 Atherosclerotic heart disease of native coronary artery without angina pectoris: Secondary | ICD-10-CM | POA: Diagnosis not present

## 2023-08-19 DIAGNOSIS — D500 Iron deficiency anemia secondary to blood loss (chronic): Secondary | ICD-10-CM | POA: Diagnosis not present

## 2023-08-19 DIAGNOSIS — R110 Nausea: Secondary | ICD-10-CM

## 2023-08-19 DIAGNOSIS — Z952 Presence of prosthetic heart valve: Secondary | ICD-10-CM | POA: Diagnosis not present

## 2023-08-19 DIAGNOSIS — Z85038 Personal history of other malignant neoplasm of large intestine: Secondary | ICD-10-CM | POA: Diagnosis not present

## 2023-08-19 DIAGNOSIS — I10 Essential (primary) hypertension: Secondary | ICD-10-CM

## 2023-08-19 DIAGNOSIS — Z951 Presence of aortocoronary bypass graft: Secondary | ICD-10-CM | POA: Diagnosis not present

## 2023-08-19 DIAGNOSIS — Z8673 Personal history of transient ischemic attack (TIA), and cerebral infarction without residual deficits: Secondary | ICD-10-CM | POA: Diagnosis not present

## 2023-08-19 LAB — GLUCOSE, CAPILLARY
Glucose-Capillary: 78 mg/dL (ref 70–99)
Glucose-Capillary: 89 mg/dL (ref 70–99)

## 2023-08-19 NOTE — Telephone Encounter (Signed)
Patient Product/process development scientist completed.    The patient is insured through [facility]. Patient has Medicare and is not eligible for a copay card, but may be able to apply for patient assistance, if available.    Ran test claim for Jardiance 10 mg and the current 30 day co-pay is $45.00.   This test claim was processed through [facility]- copay amounts may vary at other pharmacies due to pharmacy/plan contracts, or as the patient moves through the different stages of their insurance plan.     Roland Earl, CPHT Pharmacy Technician III Certified Patient Advocate [facility] Pharmacy Patient Advocate Team Direct Number: [phone]  Fax: [phone]

## 2023-08-19 NOTE — Progress Notes (Addendum)
Patient Name: Bruce Nunez Date of Encounter: 08/19/2023 [facility] Cardiologist: Norman Herrlich, MD    Interval Summary  .    Patient reports feeling nauseous this AM, and he has vomited a few times. Denies any dizziness, syncope, near syncope. Overall feels well aside from his nausea. Nausea started this AM.   Vital Signs .    Vitals:   08/18/23 2006 08/18/23 2326 08/19/23 0435 08/19/23 0736  BP:  128/68 137/77 118/70  Pulse:  (!) 104 (!) 112 (!) 109  Resp:  (!) 22 18 20   Temp:  98.6 F (37 C) 99 F (37.2 C) 99.7 F (37.6 C)  TempSrc:  Oral Oral Oral  SpO2: 100% 97% 99% 98%  Weight:      Height:        Intake/Output Summary (Last 24 hours) at 08/19/2023 0856 Last data filed at 08/19/2023 0851 Gross per 24 hour  Intake 596 ml  Output 1000 ml  Net -404 ml      08/18/2023   11:03 AM 07/31/2023    2:33 PM 07/29/2023    2:45 PM  Last 3 Weights  Weight (lbs) 139 lb 139 lb 143 lb  Weight (kg) 63.05 kg 63.05 kg 64.864 kg      Telemetry/ECG    Sinus tachycardia, HR around 110 BPM - Personally Reviewed  Physical Exam .   GEN: No acute distress.  Sitting comfortably in the bed   Neck: No JVD Cardiac:  RRR, no murmurs. Radial pulses 2+ bilaterally  Respiratory: Fine crackles in bilateral lung bases. Normal work of breathing on room air  GI: Soft, nontender, non-distended  MS: No edema in BLE  Assessment & Plan .     Pre-Syncope Orthostatic Hypotension  -Patient is an 82 year old male with a history of severe coronary artery disease s/p one-vessel CABG in 06/2023.  Postoperatively, patient developed atrial fibrillation and symptomatic heart block.  Had a permanent PPM implanted.  Now, patient presented complaining of 3 episodes of near syncope/falls in the past week. -Suspected noncardiac syncope, possibly vasovagal.  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 -Orthostatic vital signs this  admission positive-BP 128/100 when lying, down to 105/64 standing at 3 minutes.  Heart rate 110 lying, up to 132 standing at 3 minutes. - Amlodipine held- BP has improved. Patient denies recurrent falls, syncope, near syncope.  - TSH and CBC WNL  -Limited echo this admission showed EF 70-75%, no regional wall motion abnormalities, mild LVH, normal RV function, small pericardial effusion.  No aortic regurgitation  - Continue metoprolol succinate 100 mg daily for treatment of sinus tachycardia  - Encouraged PO hydration, use of thigh-high compression stockings   Nausea  - Patient reports that he woke up nauseous today and has vomited a few times  - No fever - Has PRN zofran ordered    Atrial Fibrillation  - Post op following CABG/AVR in 06/2023  - Per telemetry, he is maintaining sinus  - Continue metoprolol succinate 100 mg daily  - Continue eliquis 5 mg BID. This is the correct dose for him   Type 2 DM  - Continue jardiance   Hypothyroidism  - Continue synthroid   S/p CABG S/p AVR  - Patient underwent CABG x1 and AVR in 06/2023  - Denies chest pain - Echo this admission showed EF 70-75%, no regional wall motion abnormalities, normal RV function. Aortic valve replacement without regurgitation   S/p PPM  - Devce interrogation  with stable impedance and thresholds   For questions or updates, please contact [facility] Please consult [email or web address] for contact info under        Signed, Jonita Albee, PA-C    Patient seen and examined. Agree with assessment and plan.  According to wife and patient, he did not feel well this morning.  He was nauseated, and vomited on several occasions.  Presently, rhythm is sinus tachycardia 108 bpm.  No chest pain.  He is on metoprolol succinate 100 mg daily.  With increased heart rate, will add low-dose diltiazem 30 mg twice daily today to see if this improves his heart rate and blood pressure remains stable.  Continue Eliquis 5 mg  twice a day.  Renal function is normal with creatinine 0.89.  If nausea recurs, may need IV to get Zofran.  Will recheck ECG in AM.   Lennette Bihari, MD, [facility] 08/19/2023 12:29 PM

## 2023-08-19 NOTE — Discharge Summary (Incomplete)
Discharge Summary    Patient ID: Bruce Nunez MRN: [id number]; DOB: 08-31-1940  Admit date: 08/17/2023 Discharge date: 08/19/2023  PCP:  Gus Height, PA   [facility] Providers Cardiologist:  Norman Herrlich, MD     Discharge Diagnoses    Principal Problem:   Syncope Active Problems:   Essential hypertension   S/P AVR (aortic valve replacement)   S/P CABG x 1   Heart block AV complete [facility])    Diagnostic Studies/Procedures    Echocardiogram 08/18/23  1. Left ventricular ejection fraction, by estimation, is 70 to 75%. The  left ventricle has hyperdynamic function. The left ventricle has no  regional wall motion abnormalities. There is mild left ventricular  hypertrophy.   2. Right ventricular systolic function is normal. The right ventricular  size is normal. Tricuspid regurgitation signal is inadequate for assessing  PA pressure.   3. A small pericardial effusion is present.   4. The mitral valve is normal in structure. Trivial mitral valve  regurgitation.   5. The aortic valve has been repaired/replaced. Aortic valve  regurgitation is not visualized. There is a 21 mm Inspiris valve present  in the aortic position. Vmax 2.6 m/s, MG , EOA 1.1 cm^2, DI 0.5   6. The inferior vena cava is normal in size with greater than 50%  respiratory variability, suggesting right atrial pressure of 3 mmHg.   _____________   History of Present Illness     Bruce Nunez is a 82 y.o. male with ***  Hospital Course     Consultants: ***   ***      Did the patient have an acute coronary syndrome (MI, NSTEMI, STEMI, etc) this admission?:  {Press F2   :782956213}   {Are you ordering a CV Procedure (e.g. stress test, cath, DCCV, TEE, etc) to be done as an outpatient?   Press F2        :[id number]}   {Does this patient need a TOC appointment?:210360220} _____________  Discharge Vitals Blood pressure (!) 146/77, pulse 100, temperature 98.5 F (36.9 C),  temperature source Oral, resp. rate 19, height 5\' 7"  (1.702 m), weight 63 kg, SpO2 96%.  Filed Weights   08/18/23 1103  Weight: 63 kg    Labs & Radiologic Studies    CBC Recent Labs    08/18/23 0356  WBC 9.0  HGB 10.6*  HCT 33.9*  MCV 80.3  PLT 195   Basic Metabolic Panel Recent Labs    62/95/28 0356  NA 134*  K 4.2  CL 104  CO2 22  GLUCOSE 103*  BUN 15  CREATININE 0.89  CALCIUM 9.0  MG 1.9   Liver Function Tests No results for input(s): "AST", "ALT", "ALKPHOS", "BILITOT", "PROT", "ALBUMIN" in the last 72 hours. No results for input(s): "LIPASE", "AMYLASE" in the last 72 hours. High Sensitivity Troponin:   No results for input(s): "TROPONINIHS" in the last 720 hours.  BNP Invalid input(s): "POCBNP" D-Dimer No results for input(s): "DDIMER" in the last 72 hours. Hemoglobin A1C No results for input(s): "HGBA1C" in the last 72 hours. Fasting Lipid Panel No results for input(s): "CHOL", "HDL", "LDLCALC", "TRIG", "CHOLHDL", "LDLDIRECT" in the last 72 hours. Thyroid Function Tests Recent Labs    08/18/23 0356  TSH 0.453   _____________  ECHOCARDIOGRAM LIMITED Result Date: 08/18/2023    ECHOCARDIOGRAM LIMITED REPORT   Patient Name:   Bruce Nunez Date of Exam: 08/18/2023 Medical Rec #:  [id number]     Height:  67.0 in Accession #:    [id number]    Weight:       139.0 lb Date of Birth:  Nov 17, 1940      BSA:          1.733 m Patient Age:    82 years      BP:           138/62 mmHg Patient Gender: M             HR:           99 bpm. Exam Location:  Inpatient Procedure: Limited Echo, Cardiac Doppler and Limited Color Doppler Indications:     Aortic regurgitation I35.1  History:         Patient has prior history of Echocardiogram examinations, most                  recent 07/16/2023. Risk Factors:Hypertension, Diabetes and                  Dyslipidemia. 07/06/23 CABG and AVR with 21mm Inspiris Resilia                  Bioprosthetic. Aortic Valve: 21 mm Inspiris Resilia                   bioprosthetic valve is present in the aortic position.                   Aortic Valve: 21 mm Inspiris valve is present in the aortic                  position.  Sonographer:     Celesta Gentile RCS Referring Phys:  [id number] [facility] A Izora Ribas Diagnosing Phys: Epifanio Lesches MD IMPRESSIONS  1. Left ventricular ejection fraction, by estimation, is 70 to 75%. The left ventricle has hyperdynamic function. The left ventricle has no regional wall motion abnormalities. There is mild left ventricular hypertrophy.  2. Right ventricular systolic function is normal. The right ventricular size is normal. Tricuspid regurgitation signal is inadequate for assessing PA pressure.  3. A small pericardial effusion is present.  4. The mitral valve is normal in structure. Trivial mitral valve regurgitation.  5. The aortic valve has been repaired/replaced. Aortic valve regurgitation is not visualized. There is a 21 mm Inspiris valve present in the aortic position. Vmax 2.6 m/s, MG , EOA 1.1 cm^2, DI 0.5  6. The inferior vena cava is normal in size with greater than 50% respiratory variability, suggesting right atrial pressure of 3 mmHg. FINDINGS  Left Ventricle: Left ventricular ejection fraction, by estimation, is 70 to 75%. The left ventricle has hyperdynamic function. The left ventricle has no regional wall motion abnormalities. The left ventricular internal cavity size was small. There is mild left ventricular hypertrophy. Right Ventricle: The right ventricular size is normal. Right ventricular systolic function is normal. Tricuspid regurgitation signal is inadequate for assessing PA pressure. Pericardium: A small pericardial effusion is present. Mitral Valve: The mitral valve is normal in structure. Trivial mitral valve regurgitation. Aortic Valve: The aortic valve has been repaired/replaced. Aortic valve regurgitation is not visualized. Aortic valve mean gradient measures 13.3 mmHg. Aortic valve peak  gradient measures 25.7 mmHg. Aortic valve area, by VTI measures 1.12 cm. There is a  21 mm Inspiris valve present in the aortic position. Venous: The inferior vena cava is normal in size with greater than 50% respiratory variability, suggesting right atrial pressure of 3 mmHg. LEFT VENTRICLE PLAX 2D  LVOT diam:     1.60 cm LV SV:         43 LV SV Index:   25 LVOT Area:     2.01 cm  AORTIC VALVE AV Area (Vmax):    0.94 cm AV Area (Vmean):   0.96 cm AV Area (VTI):     1.12 cm AV Vmax:           253.33 cm/s AV Vmean:          167.333 cm/s AV VTI:            0.389 m AV Peak Grad:      25.7 mmHg AV Mean Grad:      13.3 mmHg LVOT Vmax:         118.00 cm/s LVOT Vmean:        80.200 cm/s LVOT VTI:          0.216 m LVOT/AV VTI ratio: 0.56  SHUNTS Systemic VTI:  0.22 m Systemic Diam: 1.60 cm Epifanio Lesches MD Electronically signed by Epifanio Lesches MD Signature Date/Time: 08/18/2023/4:47:05 PM    Final (Updated)    DG Chest 2 View Result Date: 07/31/2023 CLINICAL DATA:  Aortic valve repair. EXAM: CHEST - 2 VIEW COMPARISON:  Chest radiograph dated 07/18/2023. FINDINGS: No focal consolidation, pleural effusion, or pneumothorax. The cardiac silhouette is within normal limits. Median sternotomy wires and mechanical aortic valve. Left pectoral pacemaker device. No acute osseous pathology. IMPRESSION: No active cardiopulmonary disease. Electronically Signed   By: Elgie Collard M.D.   On: 07/31/2023 15:19   Disposition   Pt is being discharged home today in good condition.  Follow-up Plans & Appointments        Discharge Medications   Allergies as of 08/19/2023       Reactions   Penicillins Anaphylaxis, Shortness Of Breath, Itching, Swelling   Throat swells up but no intubation per wife   Iodinated Contrast Media Hives, Rash   Asa [aspirin] Other (See Comments)   Hx of GI bleeds   Metformin And Related Diarrhea   Monodox [doxycycline Hyclate] Nausea Only   Sulfa Antibiotics Itching,  Swelling   Ultram [tramadol] Nausea And Vomiting     Med Rec must be completed prior to using this SMARTLINK***          Outstanding Labs/Studies   ***  Duration of Discharge Encounter   Greater than 30 minutes including physician time.  Signed, Jonita Albee, PA-C 08/19/2023, 12:24 PM

## 2023-08-19 NOTE — TOC CM/SW Note (Signed)
Transition of Care [facility]) - Inpatient Brief Assessment   Patient Details  Name: Bruce Nunez MRN: [id number] Date of Birth: 12-Feb-1941  Transition of Care [facility]) CM/SW Contact:    Gala Lewandowsky, RN Phone Number: 08/19/2023, 4:28 PM   Clinical Narrative: Patient presented for syncope. PTA patient was from home with spouse. Patient is currently active with [facility] for PT. Case Manager will continue to follow for additional transition of care as the patient progresses.   Transition of Care Asessment: Insurance and Status: Insurance coverage has been reviewed Patient has primary care physician: Yes Home environment has been reviewed: reviewed Prior/Current Home Services: No current home services Social Drivers of Health Review: SDOH reviewed no interventions necessary Readmission risk has been reviewed: Yes Transition of care needs: no transition of care needs at this time

## 2023-08-19 NOTE — Plan of Care (Signed)
   Problem: Education: Goal: Knowledge of General Education information will improve Description: Including pain rating scale, medication(s)/side effects and non-pharmacologic comfort measures Outcome: Progressing   Problem: Health Behavior/Discharge Planning: Goal: Ability to manage health-related needs will improve Outcome: Progressing   Problem: Clinical Measurements: Goal: Ability to maintain clinical measurements within normal limits will improve Outcome: Progressing Goal: Will remain free from infection Outcome: Progressing Goal: Diagnostic test results will improve Outcome: Progressing Goal: Respiratory complications will improve Outcome: Progressing Goal: Cardiovascular complication will be avoided Outcome: Progressing   Problem: Activity: Goal: Risk for activity intolerance will decrease Outcome: Progressing   Problem: Nutrition: Goal: Adequate nutrition will be maintained Outcome: Progressing   Problem: Coping: Goal: Level of anxiety will decrease Outcome: Progressing   Problem: Elimination: Goal: Will not experience complications related to bowel motility Outcome: Progressing Goal: Will not experience complications related to urinary retention Outcome: Progressing   Problem: Pain Management: Goal: General experience of comfort will improve Outcome: Progressing   Problem: Safety: Goal: Ability to remain free from injury will improve Outcome: Progressing   Problem: Skin Integrity: Goal: Risk for impaired skin integrity will decrease Outcome: Progressing   Problem: Education: Goal: Understanding of cardiac disease, CV risk reduction, and recovery process will improve Outcome: Progressing Goal: Individualized Educational Video(s) Outcome: Progressing   Problem: Activity: Goal: Ability to tolerate increased activity will improve Outcome: Progressing   Problem: Cardiac: Goal: Ability to achieve and maintain adequate cardiovascular perfusion will  improve Outcome: Progressing   Problem: Health Behavior/Discharge Planning: Goal: Ability to safely manage health-related needs after discharge will improve Outcome: Progressing

## 2023-08-19 NOTE — Progress Notes (Signed)
Mobility Specialist Progress Note:   08/19/23 1500  Mobility  Activity Ambulated with assistance in hallway  Level of Assistance Minimal assist, patient does 75% or more  Assistive Device Four wheel walker  Distance Ambulated (ft) 175 ft  Activity Response Tolerated well  Mobility Referral Yes  Mobility visit 1 Mobility  Mobility Specialist Start Time (ACUTE ONLY) 1508  Mobility Specialist Stop Time (ACUTE ONLY) 1533  Mobility Specialist Time Calculation (min) (ACUTE ONLY) 25 min   Pt received in bed, agreeable to mobility. Pleasantly confused this date, thus required minA and cues for direction. Required reminders for sternal precautions. No complaints throughout. Pt left in bed with call bell and bed alarm on. RN aware.  D'Vante Earlene Plater Mobility Specialist Please contact via Special educational needs teacher or Rehab office at [phone]

## 2023-08-19 NOTE — Plan of Care (Signed)
?  Problem: Clinical Measurements: ?Goal: Will remain free from infection ?Outcome: Progressing ?  ?

## 2023-08-19 NOTE — Progress Notes (Signed)
Physical Therapy Treatment Patient Details Name: Bruce Nunez MRN: [id number] DOB: 06/13/1941 Today's Date: 08/19/2023   History of Present Illness Pt is an 82 y.o. male who presented to [facility] on 12/21 with episode of chest discomfort and three falls in home in the past week without loss of consciousness and without injury leading to concern regarding orthostatic hypotension. Pt also noted to have sinus tachycardia. PMH: AVR with CABG on 07/05/23 with postoperative  AFib and symptomatic heart block necessitating placement of a dual chamber pacemaker, aortic stenosis, CVA in 2023, hx of prostate cancer, R inguinal hernia repair in 2015, anemia, HTN, HLD, DM, hx of colon cancer, frequent urination, GERD, asthma    PT Comments  Pt making steady progress with mobility. No problems with lightheadness or dizziness during session. More stable when using rollator so he might need to use initially at DC. He already has a rollator at home. Continue to recommend HHPT resume at DC.     If plan is discharge home, recommend the following: A little help with walking and/or transfers;A little help with bathing/dressing/bathroom;Assistance with cooking/housework;Direct supervision/assist for medications management;Direct supervision/assist for financial management;Assist for transportation;Help with stairs or ramp for entrance;Supervision due to cognitive status   Can travel by private vehicle        Equipment Recommendations  None recommended by PT    Recommendations for Other Services       Precautions / Restrictions Precautions Precautions: Fall;Sternal Precaution Booklet Issued: Yes (comment) (during prior admission) Precaution Comments: watch HR and BP Restrictions Weight Bearing Restrictions Per Provider Order: Yes Other Position/Activity Restrictions: sternal precautions from AVR with CABG and pacemaker placement in 06/2023 - Dr. Izora Ribas verbally confirmed 12/22 that pt still needs to follow his  sternal precautions     Mobility  Bed Mobility Overal bed mobility: Modified Independent Bed Mobility: Sit to Supine       Sit to supine: HOB elevated, Modified independent (Device/Increase time)        Transfers Overall transfer level: Needs assistance Equipment used: None Transfers: Sit to/from Stand Sit to Stand: Min assist, Contact guard assist           General transfer comment: Initial min assist on first stand for balance. Verbal cues for hand placement. Subsequent stands with CGA and cues for hand placement    Ambulation/Gait Ambulation/Gait assistance: Contact guard assist, Min assist Gait Distance (Feet): 300 Feet Assistive device: None, Rollator (4 wheels) Gait Pattern/deviations: Step-through pattern, Decreased step length - right, Decreased step length - left, Decreased stride length, Narrow base of support Gait velocity: reduced Gait velocity interpretation: 1.31 - 2.62 ft/sec, indicative of limited community ambulator   General Gait Details: Pt primarily CGA except min assist to correct stagger when pt not using assistive device. No loss of balance when using rollator   Stairs             Wheelchair Mobility     Tilt Bed    Modified Rankin (Stroke Patients Only)       Balance Overall balance assessment: Needs assistance Sitting-balance support: No upper extremity supported, Feet supported Sitting balance-Leahy Scale: Good     Standing balance support: No upper extremity supported, During functional activity Standing balance-Leahy Scale: Fair               High level balance activites: Side stepping, Backward walking, Direction changes              Cognition Arousal: Alert Behavior During Therapy: [facility] for  tasks assessed/performed Overall Cognitive Status: Impaired/Different from baseline (wife feels likely due to medications) Area of Impairment: Memory, Safety/judgement, Awareness, Problem solving, Attention                    Current Attention Level: Selective Memory: Decreased short-term memory, Decreased recall of precautions   Safety/Judgement: Decreased awareness of safety, Decreased awareness of deficits Awareness: Emergent Problem Solving: Slow processing, Requires verbal cues          Exercises Other Exercises Other Exercises: repeated sit to stand x 5 Other Exercises: Forward/Backward stepping x 10 without UE support and CGA    General Comments General comments (skin integrity, edema, etc.): HR to 120's with activity      Pertinent Vitals/Pain Pain Assessment Pain Assessment: No/denies pain    Home Living                          Prior Function            PT Goals (current goals can now be found in the care plan section) Acute Rehab PT Goals Patient Stated Goal: to improve Progress towards PT goals: Progressing toward goals    Frequency    Min 1X/week      PT Plan      Co-evaluation              AM-PAC PT "6 Clicks" Mobility   Outcome Measure  Help needed turning from your back to your side while in a flat bed without using bedrails?: None Help needed moving from lying on your back to sitting on the side of a flat bed without using bedrails?: A Little Help needed moving to and from a bed to a chair (including a wheelchair)?: A Little Help needed standing up from a chair using your arms (e.g., wheelchair or bedside chair)?: A Little Help needed to walk in hospital room?: A Little Help needed climbing 3-5 steps with a railing? : A Little 6 Click Score: 19    End of Session Equipment Utilized During Treatment: Gait belt Activity Tolerance: Patient tolerated treatment well Patient left: in bed;with call bell/phone within reach;with bed alarm set;with family/visitor present Nurse Communication: Mobility status PT Visit Diagnosis: Unsteadiness on feet (R26.81);Other abnormalities of gait and mobility (R26.89);Muscle weakness (generalized)  (M62.81);History of falling (Z91.81);Repeated falls (R29.6)     Time: 1191-4782 PT Time Calculation (min) (ACUTE ONLY): 26 min  Charges:    $Gait Training: 23-37 mins PT General Charges $$ ACUTE PT VISIT: 1 Visit                     [facility] PT Acute Rehabilitation Services Office [phone]    Angelina Ok [facility] 08/19/2023, 1:35 PM

## 2023-08-20 ENCOUNTER — Other Ambulatory Visit (HOSPITAL_COMMUNITY): Payer: Self-pay

## 2023-08-20 DIAGNOSIS — J45909 Unspecified asthma, uncomplicated: Secondary | ICD-10-CM | POA: Diagnosis not present

## 2023-08-20 DIAGNOSIS — Z7901 Long term (current) use of anticoagulants: Secondary | ICD-10-CM | POA: Diagnosis not present

## 2023-08-20 DIAGNOSIS — I480 Paroxysmal atrial fibrillation: Secondary | ICD-10-CM

## 2023-08-20 DIAGNOSIS — I442 Atrioventricular block, complete: Secondary | ICD-10-CM | POA: Diagnosis not present

## 2023-08-20 DIAGNOSIS — Z85038 Personal history of other malignant neoplasm of large intestine: Secondary | ICD-10-CM | POA: Diagnosis not present

## 2023-08-20 DIAGNOSIS — R55 Syncope and collapse: Secondary | ICD-10-CM | POA: Diagnosis not present

## 2023-08-20 DIAGNOSIS — D500 Iron deficiency anemia secondary to blood loss (chronic): Secondary | ICD-10-CM | POA: Diagnosis not present

## 2023-08-20 DIAGNOSIS — Z8673 Personal history of transient ischemic attack (TIA), and cerebral infarction without residual deficits: Secondary | ICD-10-CM | POA: Diagnosis not present

## 2023-08-20 DIAGNOSIS — Z951 Presence of aortocoronary bypass graft: Secondary | ICD-10-CM | POA: Diagnosis not present

## 2023-08-20 DIAGNOSIS — R296 Repeated falls: Secondary | ICD-10-CM | POA: Diagnosis not present

## 2023-08-20 DIAGNOSIS — E119 Type 2 diabetes mellitus without complications: Secondary | ICD-10-CM | POA: Diagnosis not present

## 2023-08-20 DIAGNOSIS — N400 Enlarged prostate without lower urinary tract symptoms: Secondary | ICD-10-CM | POA: Diagnosis not present

## 2023-08-20 DIAGNOSIS — I10 Essential (primary) hypertension: Secondary | ICD-10-CM | POA: Diagnosis not present

## 2023-08-20 DIAGNOSIS — Z952 Presence of prosthetic heart valve: Secondary | ICD-10-CM | POA: Diagnosis not present

## 2023-08-20 DIAGNOSIS — Z79899 Other long term (current) drug therapy: Secondary | ICD-10-CM | POA: Diagnosis not present

## 2023-08-20 DIAGNOSIS — I2510 Atherosclerotic heart disease of native coronary artery without angina pectoris: Secondary | ICD-10-CM | POA: Diagnosis not present

## 2023-08-20 HISTORY — DX: Paroxysmal atrial fibrillation: I48.0

## 2023-08-20 LAB — BASIC METABOLIC PANEL
Anion gap: 11 (ref 5–15)
BUN: 17 mg/dL (ref 8–23)
CO2: 23 mmol/L (ref 22–32)
Calcium: 9.1 mg/dL (ref 8.9–10.3)
Chloride: 100 mmol/L (ref 98–111)
Creatinine, Ser: 1.09 mg/dL (ref 0.61–1.24)
GFR, Estimated: >60 mL/min (ref >60)
Glucose, Bld: 100 mg/dL — ABNORMAL HIGH (ref 70–99)
Potassium: 3.9 mmol/L (ref 3.5–5.1)
Sodium: 134 mmol/L — ABNORMAL LOW (ref 135–145)

## 2023-08-20 LAB — CBC
HCT: 35 % — ABNORMAL LOW (ref 39.0–52.0)
Hemoglobin: 11.2 g/dL — ABNORMAL LOW (ref 13.0–17.0)
MCH: 25.2 pg — ABNORMAL LOW (ref 26.0–34.0)
MCHC: 32 g/dL (ref 30.0–36.0)
MCV: 78.7 fL — ABNORMAL LOW (ref 80.0–100.0)
Platelets: 269 10*3/uL (ref 150–400)
RBC: 4.45 MIL/uL (ref 4.22–5.81)
RDW: 13.8 % (ref 11.5–15.5)
WBC: 8.1 10*3/uL (ref 4.0–10.5)
nRBC: 0 % (ref 0.0–0.2)

## 2023-08-20 MED ORDER — EMPAGLIFLOZIN 10 MG PO TABS
10.0000 mg | ORAL_TABLET | Freq: Every day | ORAL | 2 refills | Status: AC
  Filled 2023-08-20: qty 30, 30d supply, fill #0
  Filled 2023-09-17: qty 30, 30d supply, fill #1
  Filled 2023-10-21: qty 30, 30d supply, fill #0

## 2023-08-20 MED ORDER — DILTIAZEM HCL ER COATED BEADS 120 MG PO CP24
120.0000 mg | ORAL_CAPSULE | Freq: Every day | ORAL | 2 refills | Status: DC
  Filled 2023-08-20: qty 90, 90d supply, fill #0

## 2023-08-20 MED ORDER — DILTIAZEM HCL ER COATED BEADS 120 MG PO CP24
120.0000 mg | ORAL_CAPSULE | Freq: Every day | ORAL | Status: DC
  Administered 2023-08-20: 120 mg via ORAL
  Filled 2023-08-20: qty 1

## 2023-08-20 NOTE — Progress Notes (Addendum)
Patient Name: Bruce Nunez Date of Encounter: 08/20/2023 [facility] Cardiologist: Norman Herrlich, MD   Interval Summary  .    Patient reports feeling well this AM. Has been working with PT and walked in the hall yesterday without issues. HR now in the 90s-low 100s. Denies further episodes of near syncope, dizziness. Hoping to go home today   Vital Signs .    Vitals:   08/19/23 1955 08/19/23 2056 08/20/23 0153 08/20/23 0429  BP:      Pulse: 100  99 99  Resp:   17   Temp: 99.4 F (37.4 C)  98.8 F (37.1 C) 98.6 F (37 C)  TempSrc: Oral  Oral Oral  SpO2: 99% 100% 98% 100%  Weight:      Height:        Intake/Output Summary (Last 24 hours) at 08/20/2023 0812 Last data filed at 08/19/2023 1600 Gross per 24 hour  Intake 594 ml  Output 0 ml  Net 594 ml      08/18/2023   11:03 AM 07/31/2023    2:33 PM 07/29/2023    2:45 PM  Last 3 Weights  Weight (lbs) 139 lb 139 lb 143 lb  Weight (kg) 63.05 kg 63.05 kg 64.864 kg      Telemetry/ECG    Patient had an episode of atrial fibrillation overnight with HR in the 100s-110s. Now in NSR with HR in the 90s - Personally Reviewed  Physical Exam .   GEN: No acute distress.  Laying in the bed with head elevated. Asleep, wakes up quickly to voice  Neck: No JVD Cardiac:  RRR, no murmurs, rubs, or gallops. Radial pulses 2+ bilaterally  Respiratory: Clear to auscultation bilaterally. Normal work of breathing on room air  GI: Soft, nontender, non-distended  MS: No edema in BLE   Assessment & Plan .     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  Postoperatively, patient developed atrial fibrillation and symptomatic heart block.  Had a permanent PPM implanted.  Now, patient presented complaining of 3 episodes of near syncope/falls in the past week. -Suspected noncardiac syncope, possibly vasovagal.   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 -Orthostatic vital signs this admission positive-BP 128/100 when lying, down to 105/64 standing at 3 minutes.  Heart rate 110 lying, up to 132 standing at 3 minutes. - Amlodipine held- BP has improved. Patient denies recurrent falls, syncope, near syncope.  - Worked with PT and walked in the halls yesterday- tolerated activity well  - TSH and CBC WNL  -Limited echo this admission showed EF 70-75%, no regional wall motion abnormalities, mild LVH, normal RV function, small pericardial effusion.  No aortic regurgitation  - Continue metoprolol succinate 100 mg daily for treatment of sinus tachycardia  - Encouraged PO hydration, use of thigh-high compression stockings    Atrial Fibrillation  - Post op following CABG/AVR in 06/2023  - Per telemetry, patient had a brief episode of atrial fibrillation overnight with HR in the 100s-110s. Now back in NSR, HR in the 90s  - Continue metoprolol succinate 100 mg daily  - Continue eliquis 5 mg BID. This is the correct dose for him    Type 2 DM  - Continue jardiance    Hypothyroidism  - Continue synthroid    S/p CABG S/p AVR  - Patient underwent CABG x1 and  AVR in 06/2023  - Denies chest pain - Echo this admission showed EF 70-75%, no regional wall motion abnormalities, normal RV function. Aortic valve replacement without regurgitation    S/p PPM  - Devce interrogation with stable impedance and thresholds   For questions or updates, please contact [facility] Please consult [email or web address] for contact info under        Signed, Jonita Albee, PA-C    Patient seen and examined. Agree with assessment and plan.  Patient feels well this morning.  No further nausea or vomiting.  Heart rate continues to be elevated at 99-1 05.  Patient had brief episode of AF last evening.  Blood pressure today 145/77.  Will initiate long-acting  diltiazem 120 mg and give this morning.  Continue metoprolol succinate 100 mg daily.  Patient is on Eliquis 5 mg twice a day with normal renal function.  Will monitor heart rhythm and blood pressure this morning.  If patient remains stable, plan probable discharge this afternoon.  Will need office follow-up in 1 to 2 weeks.   Lennette Bihari, MD, [facility] 08/20/2023 9:38 AM

## 2023-08-20 NOTE — Progress Notes (Addendum)
Pt went into Afib at 0120. Pt asymptomatic. EKG done. Notified Dr. Piedad Climes. Will continue to monitor.

## 2023-08-20 NOTE — Progress Notes (Signed)
Mobility Specialist Progress Note:   08/20/23 1400  Mobility  Activity Ambulated with assistance in hallway  Level of Assistance Contact guard assist, steadying assist  Assistive Device Four wheel walker  Distance Ambulated (ft) 200 ft  Activity Response Tolerated well  Mobility Referral Yes  Mobility visit 1 Mobility  Mobility Specialist Start Time (ACUTE ONLY) 1347  Mobility Specialist Stop Time (ACUTE ONLY) 1403  Mobility Specialist Time Calculation (min) (ACUTE ONLY) 16 min    Pre Mobility: 96 HR During Mobility: 104 HR Post Mobility:  95 HR  Pt received in bed, eager to mobility. Asymptomatic w/ no complaints throughout. Pt left in bed with call bell and all needs met. Family present.  D'Vante Earlene Plater Mobility Specialist Please contact via Special educational needs teacher or Rehab office at [phone]

## 2023-08-20 NOTE — Discharge Summary (Addendum)
Discharge Summary    Patient ID: Bruce Nunez MRN: [id number]; DOB: Sep 09, 1940  Admit date: 08/17/2023 Discharge date: 08/20/2023  PCP:  Gus Height, PA   [facility] Providers Cardiologist:  Norman Herrlich, MD   {  Discharge Diagnoses    Principal Problem:   Syncope Active Problems:   Essential hypertension   S/P AVR (aortic valve replacement)   S/P CABG x 1   Heart block AV complete (HCC)   PAF (paroxysmal atrial fibrillation) (HCC)   Diagnostic Studies/Procedures    LIMITED ECHO (08/18/2023)  IMPRESSIONS     1. Left ventricular ejection fraction, by estimation, is 70 to 75%. The  left ventricle has hyperdynamic function. The left ventricle has no  regional wall motion abnormalities. There is mild left ventricular  hypertrophy.   2. Right ventricular systolic function is normal. The right ventricular  size is normal. Tricuspid regurgitation signal is inadequate for assessing  PA pressure.   3. A small pericardial effusion is present.   4. The mitral valve is normal in structure. Trivial mitral valve  regurgitation.   5. The aortic valve has been repaired/replaced. Aortic valve  regurgitation is not visualized. There is a 21 mm Inspiris valve present  in the aortic position. Vmax 2.6 m/s, MG , EOA 1.1 cm^2, DI 0.5   6. The inferior vena cava is normal in size with greater than 50%  respiratory variability, suggesting right atrial pressure of 3 mmHg.   FINDINGS   Left Ventricle: Left ventricular ejection fraction, by estimation, is 70  to 75%. The left ventricle has hyperdynamic function. The left ventricle  has no regional wall motion abnormalities. The left ventricular internal  cavity size was small. There is  mild left ventricular hypertrophy.   Right Ventricle: The right ventricular size is normal. Right ventricular  systolic function is normal. Tricuspid regurgitation signal is inadequate  for assessing PA pressure.   Pericardium: A  small pericardial effusion is present.   Mitral Valve: The mitral valve is normal in structure. Trivial mitral  valve regurgitation.   Aortic Valve: The aortic valve has been repaired/replaced. Aortic valve  regurgitation is not visualized. Aortic valve mean gradient measures 13.3  mmHg. Aortic valve peak gradient measures 25.7 mmHg. Aortic valve area, by  VTI measures 1.12 cm. There is a   21 mm Inspiris valve present in the aortic position.   Venous: The inferior vena cava is normal in size with greater than 50%  respiratory variability, suggesting right atrial pressure of 3 mmHg.   _____________   History of Present Illness     Bruce Nunez is a 82 y.o. male with a history of severe heart disease, s/p one vessel CABG with 21mm InspirEase valve placement in Nov 2024. During post-op period patient experienced atrial fibrillation and symptomatic heart block which then required placement of a dual chamber pacemaker. AV nodal agents were discontinued due to concerns of hypotension. Device interrogation Dec 2024 showed no evidence of atrial fibrillation, complete heart block or failure to capture.   Hospital Course     Patient presented to [facility] ED 12/20 afternoon after experiencing recurrent falls at home. He had three episodes at home where he felt lightheadedness and fell backwards. He denies any full loss of consciousness with any of these falls. He reports only minor head contact with 1/3 falls. He denied any changes of PO intake and stated that none of these episodes occurred directly after standing. Patient reported brief 5/10 chest pain  one evening that he described as central, radiating to his shoulder, lasting 30-40 minutes, alleviated with a single dose of morphine. He was negative chest pain leading up to CABG/SAVR in the past.   During this admission there was no evidence of cardiac syncope or device malfunction. A repeat echo was done to evaluate valve function and aortic  valve regurgitation. Limited echo showed showed EF 70-75%, no regional wall motion abnormalities, normal RV function. Aortic valve replacement without regurgitation  Orthostatic vitals were taken and were positive (BP 128/100 when lying, down to 105/64 standing at 3 minutes. Heart rate 110 lying, up to 132 standing at 3 minutes) indicating orthostatic hypotension.    Patients amlodipine was help and BP improved. He denied any recurrent falls, syncope or near syncope during his admission. He was up and walking with PT in the halls yesterday and tolerated it well. He continued on metoprolol to treat his sinus tachycardia. Encouraged to keep up with PO hydration and use thigh-high compression stockings.   He had an episode of atrial fibrillation post CABG/SAVR in Nov 2024. While on telemetry in the hospital it was reported that he had another episode of atrial fibrillation overnight with HR in 100-110s. He was back in normal sinus rhythm with a HR in the 90s in the morning. He was continued on his metoprolol and Eliquis doses and initiated on long-acting diltiazem 120 mg for elevated HR. Patient has been remaining stable and was deemed ready for discharge by MD this afternoon.   In regards to his T2DM his glimepiride was discharge and Jardiance 10 mg daily was started given his age and that he would have more benefit with less potential risks with starting an SGLT2i.      Did the patient have an acute coronary syndrome (MI, NSTEMI, STEMI, etc) this admission?:  No                               Did the patient have a percutaneous coronary intervention (stent / angioplasty)?:  No.      ___________  Discharge Vitals Blood pressure 138/61, pulse 71, temperature 98.8 F (37.1 C), temperature source Oral, resp. rate 18, height 5\' 7"  (1.702 m), weight 63 kg, SpO2 96%.  Filed Weights   08/18/23 1103  Weight: 63 kg    Labs & Radiologic Studies    CBC Recent Labs    08/18/23 0356 08/20/23 0832  WBC  9.0 8.1  HGB 10.6* 11.2*  HCT 33.9* 35.0*  MCV 80.3 78.7*  PLT 195 269   Basic Metabolic Panel Recent Labs    40/98/11 0356 08/20/23 0832  NA 134* 134*  K 4.2 3.9  CL 104 100  CO2 22 23  GLUCOSE 103* 100*  BUN 15 17  CREATININE 0.89 1.09  CALCIUM 9.0 9.1  MG 1.9  --    Liver Function Tests No results for input(s): "AST", "ALT", "ALKPHOS", "BILITOT", "PROT", "ALBUMIN" in the last 72 hours. No results for input(s): "LIPASE", "AMYLASE" in the last 72 hours. High Sensitivity Troponin:   No results for input(s): "TROPONINIHS" in the last 720 hours.  BNP Invalid input(s): "POCBNP" D-Dimer No results for input(s): "DDIMER" in the last 72 hours. Hemoglobin A1C No results for input(s): "HGBA1C" in the last 72 hours. Fasting Lipid Panel No results for input(s): "CHOL", "HDL", "LDLCALC", "TRIG", "CHOLHDL", "LDLDIRECT" in the last 72 hours. Thyroid Function Tests Recent Labs    08/18/23 [phone]  TSH 0.453   _____________  ECHOCARDIOGRAM LIMITED Result Date: 08/18/2023    ECHOCARDIOGRAM LIMITED REPORT   Patient Name:   Bruce Nunez Date of Exam: 08/18/2023 Medical Rec #:  [id number]     Height:       67.0 in Accession #:    [id number]    Weight:       139.0 lb Date of Birth:  1940/12/24      BSA:          1.733 m Patient Age:    82 years      BP:           138/62 mmHg Patient Gender: M             HR:           99 bpm. Exam Location:  Inpatient Procedure: Limited Echo, Cardiac Doppler and Limited Color Doppler Indications:     Aortic regurgitation I35.1  History:         Patient has prior history of Echocardiogram examinations, most                  recent 07/16/2023. Risk Factors:Hypertension, Diabetes and                  Dyslipidemia. 07/06/23 CABG and AVR with 21mm Inspiris Resilia                  Bioprosthetic. Aortic Valve: 21 mm Inspiris Resilia                  bioprosthetic valve is present in the aortic position.                   Aortic Valve: 21 mm Inspiris valve is present in  the aortic                  position.  Sonographer:     Celesta Gentile RCS Referring Phys:  [id number] [facility] [place] A Izora Ribas Diagnosing Phys: Epifanio Lesches MD IMPRESSIONS  1. Left ventricular ejection fraction, by estimation, is 70 to 75%. The left ventricle has hyperdynamic function. The left ventricle has no regional wall motion abnormalities. There is mild left ventricular hypertrophy.  2. Right ventricular systolic function is normal. The right ventricular size is normal. Tricuspid regurgitation signal is inadequate for assessing PA pressure.  3. A small pericardial effusion is present.  4. The mitral valve is normal in structure. Trivial mitral valve regurgitation.  5. The aortic valve has been repaired/replaced. Aortic valve regurgitation is not visualized. There is a 21 mm Inspiris valve present in the aortic position. Vmax 2.6 m/s, MG , EOA 1.1 cm^2, DI 0.5  6. The inferior vena cava is normal in size with greater than 50% respiratory variability, suggesting right atrial pressure of 3 mmHg. FINDINGS  Left Ventricle: Left ventricular ejection fraction, by estimation, is 70 to 75%. The left ventricle has hyperdynamic function. The left ventricle has no regional wall motion abnormalities. The left ventricular internal cavity size was small. There is mild left ventricular hypertrophy. Right Ventricle: The right ventricular size is normal. Right ventricular systolic function is normal. Tricuspid regurgitation signal is inadequate for assessing PA pressure. Pericardium: A small pericardial effusion is present. Mitral Valve: The mitral valve is normal in structure. Trivial mitral valve regurgitation. Aortic Valve: The aortic valve has been repaired/replaced. Aortic valve regurgitation is not visualized. Aortic valve mean gradient measures 13.3 mmHg. Aortic valve peak gradient measures 25.7 mmHg. Aortic valve  area, by VTI measures 1.12 cm. There is a  21 mm Inspiris valve present in the aortic position.  Venous: The inferior vena cava is normal in size with greater than 50% respiratory variability, suggesting right atrial pressure of 3 mmHg. LEFT VENTRICLE PLAX 2D LVOT diam:     1.60 cm LV SV:         43 LV SV Index:   25 LVOT Area:     2.01 cm  AORTIC VALVE AV Area (Vmax):    0.94 cm AV Area (Vmean):   0.96 cm AV Area (VTI):     1.12 cm AV Vmax:           253.33 cm/s AV Vmean:          167.333 cm/s AV VTI:            0.389 m AV Peak Grad:      25.7 mmHg AV Mean Grad:      13.3 mmHg LVOT Vmax:         118.00 cm/s LVOT Vmean:        80.200 cm/s LVOT VTI:          0.216 m LVOT/AV VTI ratio: 0.56  SHUNTS Systemic VTI:  0.22 m Systemic Diam: 1.60 cm Epifanio Lesches MD Electronically signed by Epifanio Lesches MD Signature Date/Time: 08/18/2023/4:47:05 PM    Final (Updated)    DG Chest 2 View Result Date: 07/31/2023 CLINICAL DATA:  Aortic valve repair. EXAM: CHEST - 2 VIEW COMPARISON:  Chest radiograph dated 07/18/2023. FINDINGS: No focal consolidation, pleural effusion, or pneumothorax. The cardiac silhouette is within normal limits. Median sternotomy wires and mechanical aortic valve. Left pectoral pacemaker device. No acute osseous pathology. IMPRESSION: No active cardiopulmonary disease. Electronically Signed   By: Elgie Collard M.D.   On: 07/31/2023 15:19   Disposition   Pt is being discharged home today in good condition.  Follow-up Plans & Appointments   Follow up appointment made in [place] office for 09/02/2023  Discharge Instructions     Call MD for:  difficulty breathing, headache or visual disturbances   Complete by: As directed    Call MD for:  persistant dizziness or light-headedness   Complete by: As directed    Diet - low sodium heart healthy   Complete by: As directed    Increase activity slowly   Complete by: As directed         Discharge Medications   Allergies as of 08/20/2023       Reactions   Penicillins Anaphylaxis, Shortness Of Breath, Itching,  Swelling   Throat swells up but no intubation per wife   Iodinated Contrast Media Hives, Rash   Asa [aspirin] Other (See Comments)   Hx of GI bleeds   Metformin And Related Diarrhea   Monodox [doxycycline Hyclate] Nausea Only   Sulfa Antibiotics Itching, Swelling   Ultram [tramadol] Nausea And Vomiting        Medication List     STOP taking these medications    amLODipine 2.5 MG tablet Commonly known as: NORVASC   glimepiride 2 MG tablet Commonly known as: AMARYL   nystatin 100000 UNIT/ML suspension Commonly known as: MYCOSTATIN       TAKE these medications    acetaminophen 325 MG tablet Commonly known as: TYLENOL Take 1-2 tablets (325-650 mg total) by mouth every 6 (six) hours as needed for mild pain (pain score 1-3).   albuterol 108 (90 Base) MCG/ACT inhaler Commonly known as: VENTOLIN HFA  Inhale 2 puffs into the lungs every 6 (six) hours as needed for shortness of breath.   apixaban 5 MG Tabs tablet Commonly known as: ELIQUIS Take 1 tablet (5 mg total) by mouth 2 (two) times daily.   budesonide-formoterol 160-4.5 MCG/ACT inhaler Commonly known as: SYMBICORT Inhale 2 puffs into the lungs 2 (two) times daily.   cyanocobalamin 1000 MCG/ML injection Commonly known as: VITAMIN B12 Inject 1 mL into the skin every 30 (thirty) days.   diltiazem 120 MG 24 hr capsule Commonly known as: CARDIZEM CD Take 1 capsule (120 mg total) by mouth daily. Start taking on: August 21, 2023   empagliflozin 10 MG Tabs tablet Commonly known as: JARDIANCE Take 1 tablet (10 mg total) by mouth daily. Start taking on: August 21, 2023   feeding supplement Liqd Take 237 mLs by mouth 3 (three) times daily with meals. What changed: when to take this   fexofenadine 180 MG tablet Commonly known as: ALLEGRA Take 180 mg by mouth daily.   fluticasone 50 MCG/ACT nasal spray Commonly known as: FLONASE Place 2 sprays into both nostrils 2 (two) times daily.   furosemide 20 MG  tablet Commonly known as: LASIX Take 1 tablet (20 mg total) by mouth as needed. Take one tablet once a day for three days, then take one tablet daily as needed, for weight gain of 2 lbs in a day or more OR pedal edema   levothyroxine 75 MCG tablet Commonly known as: SYNTHROID Take 75 mcg by mouth daily before breakfast.   metoprolol succinate 100 MG 24 hr tablet Commonly known as: TOPROL-XL Take 1 tablet (100 mg total) by mouth daily. Take with or immediately following a meal. What changed:  how much to take when to take this additional instructions   omeprazole 20 MG capsule Commonly known as: PRILOSEC Take 20 mg by mouth in the morning.   potassium chloride SA 20 MEQ tablet Commonly known as: KLOR-CON M Take 1 tablet (20 mEq total) by mouth as needed. Take one tablet once a day for three days, then take one tablet daily as needed when you take your Lasix. What changed:  when to take this reasons to take this additional instructions   rosuvastatin 20 MG tablet Commonly known as: CRESTOR Take 1 tablet (20 mg total) by mouth daily.   solifenacin 10 MG tablet Commonly known as: VESICARE Take 10 mg by mouth daily.   tamsulosin 0.4 MG Caps capsule Commonly known as: FLOMAX Take 0.4 mg by mouth 2 (two) times daily.           Duration of Discharge Encounter   Greater than 30 minutes including physician time.  Signed, Olena Leatherwood, PA-C 08/20/2023, 1:59 PM

## 2023-08-20 NOTE — Plan of Care (Signed)
°  Problem: Clinical Measurements: Goal: Will remain free from infection Outcome: Progressing   Problem: Activity: Goal: Risk for activity intolerance will decrease Outcome: Progressing   Problem: Nutrition: Goal: Adequate nutrition will be maintained Outcome: Progressing

## 2023-08-20 NOTE — Plan of Care (Signed)
Discharging home.  Patient's son is at the bedside.

## 2023-08-22 DIAGNOSIS — Z7952 Long term (current) use of systemic steroids: Secondary | ICD-10-CM | POA: Diagnosis not present

## 2023-08-22 DIAGNOSIS — I2510 Atherosclerotic heart disease of native coronary artery without angina pectoris: Secondary | ICD-10-CM | POA: Diagnosis not present

## 2023-08-22 DIAGNOSIS — Z85828 Personal history of other malignant neoplasm of skin: Secondary | ICD-10-CM | POA: Diagnosis not present

## 2023-08-22 DIAGNOSIS — Z48812 Encounter for surgical aftercare following surgery on the circulatory system: Secondary | ICD-10-CM | POA: Diagnosis not present

## 2023-08-22 DIAGNOSIS — I351 Nonrheumatic aortic (valve) insufficiency: Secondary | ICD-10-CM | POA: Diagnosis not present

## 2023-08-22 DIAGNOSIS — D649 Anemia, unspecified: Secondary | ICD-10-CM | POA: Diagnosis not present

## 2023-08-22 DIAGNOSIS — Z7984 Long term (current) use of oral hypoglycemic drugs: Secondary | ICD-10-CM | POA: Diagnosis not present

## 2023-08-22 DIAGNOSIS — Z8673 Personal history of transient ischemic attack (TIA), and cerebral infarction without residual deficits: Secondary | ICD-10-CM | POA: Diagnosis not present

## 2023-08-22 DIAGNOSIS — Z950 Presence of cardiac pacemaker: Secondary | ICD-10-CM | POA: Diagnosis not present

## 2023-08-22 DIAGNOSIS — Z7902 Long term (current) use of antithrombotics/antiplatelets: Secondary | ICD-10-CM | POA: Diagnosis not present

## 2023-08-22 DIAGNOSIS — F329 Major depressive disorder, single episode, unspecified: Secondary | ICD-10-CM | POA: Diagnosis not present

## 2023-08-22 DIAGNOSIS — N200 Calculus of kidney: Secondary | ICD-10-CM | POA: Diagnosis not present

## 2023-08-22 DIAGNOSIS — Z7951 Long term (current) use of inhaled steroids: Secondary | ICD-10-CM | POA: Diagnosis not present

## 2023-08-22 DIAGNOSIS — Z951 Presence of aortocoronary bypass graft: Secondary | ICD-10-CM | POA: Diagnosis not present

## 2023-08-22 DIAGNOSIS — J45909 Unspecified asthma, uncomplicated: Secondary | ICD-10-CM | POA: Diagnosis not present

## 2023-08-22 DIAGNOSIS — I10 Essential (primary) hypertension: Secondary | ICD-10-CM | POA: Diagnosis not present

## 2023-08-26 DIAGNOSIS — Z7984 Long term (current) use of oral hypoglycemic drugs: Secondary | ICD-10-CM | POA: Diagnosis not present

## 2023-08-26 DIAGNOSIS — Z7951 Long term (current) use of inhaled steroids: Secondary | ICD-10-CM | POA: Diagnosis not present

## 2023-08-26 DIAGNOSIS — I351 Nonrheumatic aortic (valve) insufficiency: Secondary | ICD-10-CM | POA: Diagnosis not present

## 2023-08-26 DIAGNOSIS — Z951 Presence of aortocoronary bypass graft: Secondary | ICD-10-CM | POA: Diagnosis not present

## 2023-08-26 DIAGNOSIS — Z7902 Long term (current) use of antithrombotics/antiplatelets: Secondary | ICD-10-CM | POA: Diagnosis not present

## 2023-08-26 DIAGNOSIS — F329 Major depressive disorder, single episode, unspecified: Secondary | ICD-10-CM | POA: Diagnosis not present

## 2023-08-26 DIAGNOSIS — Z7952 Long term (current) use of systemic steroids: Secondary | ICD-10-CM | POA: Diagnosis not present

## 2023-08-26 DIAGNOSIS — N200 Calculus of kidney: Secondary | ICD-10-CM | POA: Diagnosis not present

## 2023-08-26 DIAGNOSIS — I10 Essential (primary) hypertension: Secondary | ICD-10-CM | POA: Diagnosis not present

## 2023-08-26 DIAGNOSIS — Z950 Presence of cardiac pacemaker: Secondary | ICD-10-CM | POA: Diagnosis not present

## 2023-08-26 DIAGNOSIS — D649 Anemia, unspecified: Secondary | ICD-10-CM | POA: Diagnosis not present

## 2023-08-26 DIAGNOSIS — Z48812 Encounter for surgical aftercare following surgery on the circulatory system: Secondary | ICD-10-CM | POA: Diagnosis not present

## 2023-08-26 DIAGNOSIS — J45909 Unspecified asthma, uncomplicated: Secondary | ICD-10-CM | POA: Diagnosis not present

## 2023-08-26 DIAGNOSIS — Z8673 Personal history of transient ischemic attack (TIA), and cerebral infarction without residual deficits: Secondary | ICD-10-CM | POA: Diagnosis not present

## 2023-08-26 DIAGNOSIS — Z85828 Personal history of other malignant neoplasm of skin: Secondary | ICD-10-CM | POA: Diagnosis not present

## 2023-08-26 DIAGNOSIS — I2510 Atherosclerotic heart disease of native coronary artery without angina pectoris: Secondary | ICD-10-CM | POA: Diagnosis not present

## 2023-08-28 NOTE — Progress Notes (Signed)
 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 Pt elderly and with previous CVA. Pt had complications with procedure with Afib/heart block needing PPM and with new acute small CVA. Spent 2 weeks in rehab. He has been readmitted with falls and that work up with EF 70% and well seated AVR. He was tachycardic and medically optimized. He has been home about a week and experienced another episode of feeling his head twinge and needing to rest back against the wall to feel stable. He does not know if his heart was racing at that time. He is eating well but has felt weaker lately. He is seeing Dr Bruce on Monday. 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   Heart murmur    Hemorrhoids    History of kidney stones    Hx of colon cancer, stage I    Hx of nonmelanoma skin cancer    Hx of peptic ulcer    as teenager   Hx of transfusion    Hyperlipidemia    Hypertension    Inguinal hernia    PONV (postoperative nausea and vomiting)    only when had Ether as Anesthesia drug   Prostate cancer (HCC) 08/15/2012   also Hx colon cancer / Hx  skin cancer   Right inguinal hernia 05/08/2016   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   S/P right inguinal hernia repair Sept 2015 05/17/2014   Recurrent right indirect  Hernia treated with robotic TAPP repair with enterolysis Sept 2017   Stroke [facility]) 03/28/2022    Past Surgical History:  Procedure Laterality Date   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   Procedure: AORTIC VALVE REPLACEMENT WITH INSPIRIS AORTIC VALVE;  Surgeon: Maryjane Mt, MD;  Location: [facility] OR;  Service: Open Heart Surgery;  Laterality: N/A;   cataract surgery  2009   CORONARY ARTERY BYPASS GRAFT N/A 07/05/2023   Procedure: CORONARY ARTERY BYPASS GRAFTING, USING LEFT INTERNAL MAMMARY ARTERY;  Surgeon: Maryjane Mt, MD;  Location: [facility] OR;  Service: Open Heart Surgery;  Laterality: N/A;   CYSTOSCOPY     INGUINAL HERNIA REPAIR Right 05/17/2014   Procedure: RIGHT INGUINAL HERNIA REPAIR ;  Surgeon: Adina Lunger, MD;  Location: [facility] ORS;  Service: General;  Laterality: Right;  With MESH   KNEE SURGERY Right 2012   LEFT HEART CATH AND CORONARY ANGIOGRAPHY  N/A 05/03/2023   Procedure: LEFT HEART CATH AND CORONARY ANGIOGRAPHY;  Surgeon: Wonda Sharper, MD;  Location: [facility] INVASIVE CV LAB;  Service: Cardiovascular;  Laterality: N/A;   PACEMAKER IMPLANT N/A 07/17/2023   Procedure: PACEMAKER IMPLANT;  Surgeon: Inocencio Soyla Lunger, MD;  Location: [facility] INVASIVE CV LAB;  Service: Cardiovascular;  Laterality: N/A;   PARTIAL COLECTOMY  2005   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   RADIOACTIVE SEED IMPLANT N/A 11/24/2012   Procedure: RADIOACTIVE SEED IMPLANT;  Surgeon: Alm GORMAN Fragmin, MD;  Location[facility];  Service: Urology;  Laterality: N/A;   71seeds implanted  no seeds found in bladder   TEE WITHOUT CARDIOVERSION N/A 07/05/2023   Procedure: TRANSESOPHAGEAL ECHOCARDIOGRAM;  Surgeon: Maryjane Mt, MD;  Location: [facility] OR;  Service: Open Heart Surgery;  Laterality: N/A;   THROAT  SURGERY  1940's   as child (age 30)growth that created fissure/corrective surgery   TONSILLECTOMY     age 27   XI ROBOTIC ASSISTED INGUINAL HERNIA REPAIR WITH MESH Right 05/08/2016   Procedure: XI ROBOTIC ASSISTED REPAIR OF RECURRENT RIGHT INGUINAL HERNIA;  Surgeon: Donnice Lunger, MD;  Location: [facility] ORS;  Service: General;  Laterality: Right;  With MESH    Social History   Tobacco Use  Smoking Status Never   Passive exposure: Never  Smokeless Tobacco Never    Social History   Substance and Sexual Activity  Alcohol Use No    Social History   Socioeconomic History   Marital status: Married    Spouse name: Not on file   Number of children: 2   Years of education: Not on file   Highest education level: Not on file  Occupational History   Occupation: Retired    Comment: Designer, Fashion/clothing  Tobacco Use   Smoking status: Never    Passive exposure: Never   Smokeless tobacco: Never  Vaping Use   Vaping status: Never Used  Substance and Sexual Activity   Alcohol use: No   Drug use: No   Sexual activity: Not on file  Other Topics Concern   Not on file  Social History Narrative   Not on file   Social Drivers of Health   Financial Resource Strain: Not on file  Food Insecurity: No Food Insecurity (08/17/2023)   Hunger Vital Sign    Worried About Running Out of Food in the Last Year: Never true    Ran Out of Food in the Last Year: Never true  Transportation Needs: No Transportation Needs (08/17/2023)   PRAPARE - Administrator, Civil Service (Medical): No    Lack of Transportation (Non-Medical): No  Physical Activity: Not on file  Stress: Not on file  Social Connections: Not on file  Intimate Partner Violence: Not At Risk (08/17/2023)   Humiliation, Afraid, Rape, and Kick questionnaire    Fear of Current or Ex-Partner: No    Emotionally Abused: No    Physically Abused: No    Sexually Abused: No    Allergies  Allergen Reactions   Penicillins Anaphylaxis,  Shortness Of Breath, Itching and Swelling    Throat swells up but no intubation per wife   Iodinated Contrast Media Hives and Rash   Asa [Aspirin ] Other (See Comments)    Hx of GI bleeds   Metformin And Related Diarrhea   Monodox [Doxycycline Hyclate] Nausea Only   Sulfa Antibiotics Itching and Swelling   Ultram [Tramadol] Nausea And Vomiting    Current Outpatient Medications  Medication Sig  Dispense Refill   acetaminophen  (TYLENOL ) 325 MG tablet Take 1-2 tablets (325-650 mg total) by mouth every 6 (six) hours as needed for mild pain (pain score 1-3).     albuterol  (PROVENTIL  HFA;VENTOLIN  HFA) 108 (90 BASE) MCG/ACT inhaler Inhale 2 puffs into the lungs every 6 (six) hours as needed for shortness of breath.     apixaban  (ELIQUIS ) 5 MG TABS tablet Take 1 tablet (5 mg total) by mouth 2 (two) times daily.     budesonide -formoterol  (SYMBICORT) 160-4.5 MCG/ACT inhaler Inhale 2 puffs into the lungs 2 (two) times daily.     cyanocobalamin  (,VITAMIN B-12,) 1000 MCG/ML injection Inject 1 mL into the skin every 30 (thirty) days.     diltiazem  (CARDIZEM  CD) 120 MG 24 hr capsule Take 1 capsule (120 mg total) by mouth daily. 90 capsule 2   empagliflozin  (JARDIANCE ) 10 MG TABS tablet Take 1 tablet (10 mg total) by mouth daily. 90 tablet 2   feeding supplement (ENSURE ENLIVE / ENSURE PLUS) LIQD Take 237 mLs by mouth 3 (three) times daily with meals. (Patient taking differently: Take 237 mLs by mouth 2 (two) times daily.)     fexofenadine (ALLEGRA) 180 MG tablet Take 180 mg by mouth daily.     fluticasone  (FLONASE ) 50 MCG/ACT nasal spray Place 2 sprays into both nostrils 2 (two) times daily.     furosemide  (LASIX ) 20 MG tablet Take 1 tablet (20 mg total) by mouth as needed. Take one tablet once a day for three days, then take one tablet daily as needed, for weight gain of 2 lbs in a day or more OR pedal edema 30 tablet 3   levothyroxine  (SYNTHROID ) 75 MCG tablet Take 75 mcg by mouth daily before breakfast.      metoprolol  succinate (TOPROL -XL) 100 MG 24 hr tablet Take 1 tablet (100 mg total) by mouth daily. Take with or immediately following a meal. (Patient taking differently: Take 50 mg by mouth 2 (two) times daily.)     omeprazole  (PRILOSEC ) 20 MG capsule Take 20 mg by mouth in the morning.     potassium chloride  SA (KLOR-CON  M) 20 MEQ tablet Take 1 tablet (20 mEq total) by mouth as needed. Take one tablet once a day for three days, then take one tablet daily as needed when you take your Lasix . (Patient taking differently: Take 20 mEq by mouth daily as needed (Lasix  use).) 30 tablet 3   rosuvastatin  (CRESTOR ) 20 MG tablet Take 1 tablet (20 mg total) by mouth daily. 90 tablet 1   solifenacin (VESICARE) 10 MG tablet Take 10 mg by mouth daily.     Tamsulosin  HCl (FLOMAX ) 0.4 MG CAPS Take 0.4 mg by mouth 2 (two) times daily.      No current facility-administered medications for this visit.     Family History  Problem Relation Age of Onset   Heart attack Other    Brain cancer Other    Coronary artery disease Other    Prostate cancer Other        Physical Exam: Neuro intact Lungs: clear Card: RR with no murmur Ext: No edema     Diagnostic Studies & Laboratory data: I have personally reviewed the following studies and agree with the findings     Recent Radiology Findings:       Recent Lab Findings: Lab Results  Component Value Date   WBC 8.1 08/20/2023   HGB 11.2 (L) 08/20/2023   HCT 35.0 (L) 08/20/2023   PLT 269 08/20/2023  GLUCOSE 100 (H) 08/20/2023   CHOL 74 07/11/2023   TRIG 149 07/11/2023   HDL 11 (L) 07/11/2023   LDLCALC 33 07/11/2023   ALT 15 07/29/2023   AST 27 07/29/2023   NA 134 (L) 08/20/2023   K 3.9 08/20/2023   CL 100 08/20/2023   CREATININE 1.09 08/20/2023   BUN 17 08/20/2023   CO2 23 08/20/2023   TSH 0.453 08/18/2023   INR 1.8 (H) 07/05/2023   HGBA1C 6.1 (H) 07/11/2023      Assessment / Plan:     SP AVR/CABG. His BP is low in office today and  heart rate under good control. The etiology of the dizzy spells may be related to hyperdynamic heart and occasional rapid heart rates. We discussed that holding cardizem  may help with low BP over next few days until seen by Dr Bruce. He may need holter monitor. I discussed if he still has these episodes to seek ER. Not sure if cummulative CVAs has anything to add to these symptoms. He otherwise is healing well and will follow up prn   I have spent 30 min in review of the records, viewing studies and in face to face with patient and in coordination of future care    Deward Kallman 08/28/2023 6:11 PM

## 2023-08-29 ENCOUNTER — Ambulatory Visit: Payer: Self-pay | Admitting: Thoracic Surgery (Cardiothoracic Vascular Surgery)

## 2023-08-29 ENCOUNTER — Encounter: Payer: Self-pay | Admitting: Thoracic Surgery (Cardiothoracic Vascular Surgery)

## 2023-08-29 VITALS — BP 93/39 | HR 64 | Resp 20 | Ht 67.0 in | Wt 135.0 lb

## 2023-08-29 DIAGNOSIS — Z951 Presence of aortocoronary bypass graft: Secondary | ICD-10-CM

## 2023-08-29 DIAGNOSIS — I2510 Atherosclerotic heart disease of native coronary artery without angina pectoris: Secondary | ICD-10-CM

## 2023-08-29 DIAGNOSIS — I351 Nonrheumatic aortic (valve) insufficiency: Secondary | ICD-10-CM

## 2023-08-29 DIAGNOSIS — Z952 Presence of prosthetic heart valve: Secondary | ICD-10-CM

## 2023-08-29 NOTE — Patient Instructions (Signed)
 Hold cadizem Go to ER if continues with dizzy spells Follow up prn Keep appt with Dr Dulce Sellar

## 2023-08-30 DIAGNOSIS — I10 Essential (primary) hypertension: Secondary | ICD-10-CM | POA: Diagnosis not present

## 2023-08-30 DIAGNOSIS — Z951 Presence of aortocoronary bypass graft: Secondary | ICD-10-CM | POA: Diagnosis not present

## 2023-08-30 DIAGNOSIS — I2510 Atherosclerotic heart disease of native coronary artery without angina pectoris: Secondary | ICD-10-CM | POA: Diagnosis not present

## 2023-08-30 DIAGNOSIS — J45909 Unspecified asthma, uncomplicated: Secondary | ICD-10-CM | POA: Diagnosis not present

## 2023-08-30 DIAGNOSIS — Z7952 Long term (current) use of systemic steroids: Secondary | ICD-10-CM | POA: Diagnosis not present

## 2023-08-30 DIAGNOSIS — Z48812 Encounter for surgical aftercare following surgery on the circulatory system: Secondary | ICD-10-CM | POA: Diagnosis not present

## 2023-08-30 DIAGNOSIS — Z85828 Personal history of other malignant neoplasm of skin: Secondary | ICD-10-CM | POA: Diagnosis not present

## 2023-08-30 DIAGNOSIS — Z8673 Personal history of transient ischemic attack (TIA), and cerebral infarction without residual deficits: Secondary | ICD-10-CM | POA: Diagnosis not present

## 2023-08-30 DIAGNOSIS — Z950 Presence of cardiac pacemaker: Secondary | ICD-10-CM | POA: Diagnosis not present

## 2023-08-30 DIAGNOSIS — F329 Major depressive disorder, single episode, unspecified: Secondary | ICD-10-CM | POA: Diagnosis not present

## 2023-08-30 DIAGNOSIS — D649 Anemia, unspecified: Secondary | ICD-10-CM | POA: Diagnosis not present

## 2023-08-30 DIAGNOSIS — N200 Calculus of kidney: Secondary | ICD-10-CM | POA: Diagnosis not present

## 2023-08-30 DIAGNOSIS — Z7902 Long term (current) use of antithrombotics/antiplatelets: Secondary | ICD-10-CM | POA: Diagnosis not present

## 2023-08-30 DIAGNOSIS — I351 Nonrheumatic aortic (valve) insufficiency: Secondary | ICD-10-CM | POA: Diagnosis not present

## 2023-08-30 DIAGNOSIS — Z7951 Long term (current) use of inhaled steroids: Secondary | ICD-10-CM | POA: Diagnosis not present

## 2023-08-30 DIAGNOSIS — Z7984 Long term (current) use of oral hypoglycemic drugs: Secondary | ICD-10-CM | POA: Diagnosis not present

## 2023-08-31 DIAGNOSIS — Z7902 Long term (current) use of antithrombotics/antiplatelets: Secondary | ICD-10-CM | POA: Diagnosis not present

## 2023-08-31 DIAGNOSIS — Z7952 Long term (current) use of systemic steroids: Secondary | ICD-10-CM | POA: Diagnosis not present

## 2023-08-31 DIAGNOSIS — Z951 Presence of aortocoronary bypass graft: Secondary | ICD-10-CM | POA: Diagnosis not present

## 2023-08-31 DIAGNOSIS — Z85828 Personal history of other malignant neoplasm of skin: Secondary | ICD-10-CM | POA: Diagnosis not present

## 2023-08-31 DIAGNOSIS — Z48812 Encounter for surgical aftercare following surgery on the circulatory system: Secondary | ICD-10-CM | POA: Diagnosis not present

## 2023-08-31 DIAGNOSIS — I10 Essential (primary) hypertension: Secondary | ICD-10-CM | POA: Diagnosis not present

## 2023-08-31 DIAGNOSIS — I351 Nonrheumatic aortic (valve) insufficiency: Secondary | ICD-10-CM | POA: Diagnosis not present

## 2023-08-31 DIAGNOSIS — N200 Calculus of kidney: Secondary | ICD-10-CM | POA: Diagnosis not present

## 2023-08-31 DIAGNOSIS — J45909 Unspecified asthma, uncomplicated: Secondary | ICD-10-CM | POA: Diagnosis not present

## 2023-08-31 DIAGNOSIS — F329 Major depressive disorder, single episode, unspecified: Secondary | ICD-10-CM | POA: Diagnosis not present

## 2023-08-31 DIAGNOSIS — Z7984 Long term (current) use of oral hypoglycemic drugs: Secondary | ICD-10-CM | POA: Diagnosis not present

## 2023-08-31 DIAGNOSIS — D649 Anemia, unspecified: Secondary | ICD-10-CM | POA: Diagnosis not present

## 2023-08-31 DIAGNOSIS — Z950 Presence of cardiac pacemaker: Secondary | ICD-10-CM | POA: Diagnosis not present

## 2023-08-31 DIAGNOSIS — Z8673 Personal history of transient ischemic attack (TIA), and cerebral infarction without residual deficits: Secondary | ICD-10-CM | POA: Diagnosis not present

## 2023-08-31 DIAGNOSIS — Z7951 Long term (current) use of inhaled steroids: Secondary | ICD-10-CM | POA: Diagnosis not present

## 2023-08-31 DIAGNOSIS — I2510 Atherosclerotic heart disease of native coronary artery without angina pectoris: Secondary | ICD-10-CM | POA: Diagnosis not present

## 2023-09-01 NOTE — Progress Notes (Signed)
 Cardiology Office Note:  .   Date:  09/02/2023  ID:  Bruce Nunez, DOB [birth year], MRN [id number] PCP: [name], PA  [facility] Providers Cardiologist:  Redell Leiter, MD    History of Present Illness: .   Bruce Nunez is a 83 y.o. 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  08/18/2023 echo EF 70-75%, aortic valve repaired/replaced without regurgitation; Aortic valve mean gradient measures 13.3  07/17/2023 PPM implantation 07/16/2023 echo  EF 60 to 65%, AV replacement in the aortic position without regurgitation, trivial pericardial effusion 07/05/2023 CABG x 1 LIMA > LAD 07/05/2023 AVR 05/03/2023 LHC multivessel CAD, severe aortic regurgitation  He was admitted on 07/05/2023 to 07/20/2023, underwent CABG x 1 LIMA to LAD and AVR utilizing a 21 mm Inspiris aortic valve.  He suffered with some delirium and agitation postoperatively, initially treated successfully with medications however he continued to wax and wane, neurology was consulted, MRI of the brain revealed punctate foci revealing subacute infarcts.  He developed rate controlled postoperative atrial fibrillation, he was started on diltiazem  secondary to iodine contrast allergy, Plavix  was transition to Eliquis  for further stroke prevention.  He subsequently developed first-degree AV block, was later found to be in complete heart block, nodal blocking agents were stopped however the ultimate recommendation was to proceed with a PPM which was completed on 07/17/2023.  He was ultimately able to discharge to SNF on 11/23.  Evaluated 07/29/23 after his recent hospitalization as outlined above.  Residing at SNF, BP was elevated > 180 systolic and we restarted his Norvasc .   He presented to [facility] on 08/16/2023 >> [facility] following recurrent falls at home associated with dizziness.  Echo  revealed valve was functioning appropriately, orthostatic vitals were positive. He was subsequently evaluated by Dr. Maryjane, BP was in the 90's systolic and he was advised to stop his Cardizem .  He presents today accompanied by his wife for follow up of his BP. He is doing better since stopping Cardizem . He has had a slow recovery and questions if he is doing well. We discussed that his post-op was complicated by CHB and the need for PPM, subsequent delirium and stroke >> dc to SNF. He still has PT/OT coming to his house, plans to participate in cardiac rehab once he is done with home services. No further episodes of near syncope, falls, dizziness. He did have one episode of chest pain near Christmas but was not consistent with anginal symptoms he previously had. He denies chest pain, palpitations, dyspnea, pnd, orthopnea, n, v, dizziness, syncope, edema, weight gain, or early satiety.   ROS: Review of Systems  All other systems reviewed and are negative.    Studies Reviewed: .        Cardiac Studies & Procedures   CARDIAC CATHETERIZATION  CARDIAC CATHETERIZATION 05/03/2023  Narrative   Prox RCA to Mid RCA lesion is 50% stenosed.   Mid LM to Dist LM lesion is 30% stenosed.   Prox Cx to Dist Cx lesion is 50% stenosed.   Prox LAD to Mid LAD lesion is 50% stenosed.   1st Diag lesion is 60% stenosed.   There is severe (4+) aortic regurgitation.  1.  Moderate multivessel coronary artery disease with mild distal left main plaquing of 30%, moderate diffuse LAD stenosis of 50%, mild to moderate mid circumflex stenosis of 40 to  50%, and moderate mid RCA stenosis of 50%. 2.  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  Severe right subclavian tortuosity makes cardiac catheterization very difficult from right radial access.  If patient requires future cardiac catheterization, avoid right radial access.  Recommendations: Heart team review, cardiac CTA (TAVR protocol) to  evaluate if there is enough aortic valve calcification to perform TAVR as aortic insufficiency appears to be the primary valve lesion.  The patient has a very wide pulse pressure and angiographically has severe AI.  Findings Coronary Findings Diagnostic  Dominance: Right  Left Main Mid LM to Dist LM lesion is 30% stenosed.  Left Anterior Descending The LAD has moderate diffuse 50% stenosis throughout the proximal and mid vessel.  The first diagonal has a 50 to 60% ostial stenosis. Prox LAD to Mid LAD lesion is 50% stenosed.  First Diagonal Branch 1st Diag lesion is 60% stenosed.  Left Circumflex There is mild diffuse disease throughout the vessel. Prox Cx to Dist Cx lesion is 50% stenosed. The lesion is moderately calcified.  Right Coronary Artery There is mild diffuse disease throughout the vessel. Dominant vessel with no high-grade CAD.  There is moderate diffuse calcification present.  The mid RCA has a 50% stenosis.  The PDA and PLA branches are patent with no significant stenoses.  The distal vessel has mild diffuse plaquing with no stenosis. Prox RCA to Mid RCA lesion is 50% stenosed. The lesion is moderately calcified.  Intervention  No interventions have been documented.    ECHOCARDIOGRAM  ECHOCARDIOGRAM LIMITED 08/18/2023  Narrative ECHOCARDIOGRAM LIMITED REPORT    Patient Name:   Bruce Nunez Date of Exam: 08/18/2023 Medical Rec #:  [id number]     Height:       67.0 in Accession #:    [id number]    Weight:       139.0 lb Date of Birth:  1941-04-12      BSA:          1.733 m Patient Age:    82 years      BP:           138/62 mmHg Patient Gender: M             HR:           99 bpm. Exam Location:  Inpatient  Procedure: Limited Echo, Cardiac Doppler and Limited Color Doppler  Indications:     Aortic regurgitation I35.1  History:         Patient has prior history of Echocardiogram examinations, most recent 07/16/2023. Risk Factors:Hypertension, Diabetes  and Dyslipidemia. 07/06/23 CABG and AVR with 21mm Inspiris Resilia Bioprosthetic. Aortic Valve: 21 mm Inspiris Resilia bioprosthetic valve is present in the aortic position.  Aortic Valve: 21 mm Inspiris valve is present in the aortic position.  Sonographer:     Aida Pizza RCS Referring Phys:  [id number] [facility] A SANTO Diagnosing Phys: Lonni Nanas MD  IMPRESSIONS   1. Left ventricular ejection fraction, by estimation, is 70 to 75%. The left ventricle has hyperdynamic function. The left ventricle has no regional wall motion abnormalities. There is mild left ventricular hypertrophy. 2. Right ventricular systolic function is normal. The right ventricular size is normal. Tricuspid regurgitation signal is inadequate for assessing PA pressure. 3. A small pericardial effusion is present. 4. The mitral valve is normal in structure. Trivial mitral valve regurgitation. 5. The aortic valve has been repaired/replaced. Aortic valve regurgitation is not visualized. There is a 21 mm Inspiris  valve present in the aortic position. Vmax 2.6 m/s, MG , EOA 1.1 cm^2, DI 0.5 6. The inferior vena cava is normal in size with greater than 50% respiratory variability, suggesting right atrial pressure of 3 mmHg.  FINDINGS Left Ventricle: Left ventricular ejection fraction, by estimation, is 70 to 75%. The left ventricle has hyperdynamic function. The left ventricle has no regional wall motion abnormalities. The left ventricular internal cavity size was small. There is mild left ventricular hypertrophy.  Right Ventricle: The right ventricular size is normal. Right ventricular systolic function is normal. Tricuspid regurgitation signal is inadequate for assessing PA pressure.  Pericardium: A small pericardial effusion is present.  Mitral Valve: The mitral valve is normal in structure. Trivial mitral valve regurgitation.  Aortic Valve: The aortic valve has been repaired/replaced. Aortic valve  regurgitation is not visualized. Aortic valve mean gradient measures 13.3 mmHg. Aortic valve peak gradient measures 25.7 mmHg. Aortic valve area, by VTI measures 1.12 cm. There is a 21 mm Inspiris valve present in the aortic position.  Venous: The inferior vena cava is normal in size with greater than 50% respiratory variability, suggesting right atrial pressure of 3 mmHg.  LEFT VENTRICLE PLAX 2D LVOT diam:     1.60 cm LV SV:         43 LV SV Index:   25 LVOT Area:     2.01 cm   AORTIC VALVE AV Area (Vmax):    0.94 cm AV Area (Vmean):   0.96 cm AV Area (VTI):     1.12 cm AV Vmax:           253.33 cm/s AV Vmean:          167.333 cm/s AV VTI:            0.389 m AV Peak Grad:      25.7 mmHg AV Mean Grad:      13.3 mmHg LVOT Vmax:         118.00 cm/s LVOT Vmean:        80.200 cm/s LVOT VTI:          0.216 m LVOT/AV VTI ratio: 0.56   SHUNTS Systemic VTI:  0.22 m Systemic Diam: 1.60 cm  Lonni Nanas MD Electronically signed by Lonni Nanas MD Signature Date/Time: 08/18/2023/4:47:05 PM    Final (Updated)  TEE  ECHO INTRAOPERATIVE TEE 07/05/2023  Narrative *INTRAOPERATIVE TRANSESOPHAGEAL REPORT *    Patient Name:   Bruce Nunez  Date of Exam: 07/05/2023 Medical Rec #:  [id number]      Height:       67.0 in Accession #:    [id number]     Weight:       146.3 lb Date of Birth:  Jul 13, 1941       BSA:          1.77 m Patient Age:    82 years       BP:           161/54 mmHg Patient Gender: M              HR:           81 bpm. Exam Location:  Anesthesiology  Transesophogeal exam was perform intraoperatively during surgical procedure. Patient was closely monitored under general anesthesia during the entirety of examination.  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 POST-OP IMPRESSIONS _ Left  Ventricle: The left ventricle is unchanged from pre-bypass. _ Right Ventricle: The right ventricle appears unchanged from pre-bypass. _ Aorta: The aorta appears unchanged from pre-bypass. _ Left Atrial Appendage: The left atrial appendage appears unchanged from pre-bypass. _ Aortic Valve: A bioprosthetic valve was placed, leaflets are freely mobile Size; 21mm. The gradient recorded across the prosthetic valve is within the expected range. No perivalvular leak noted. Mean PG _ Mitral Valve: The mitral valve appears unchanged from pre-bypass. _ Tricuspid Valve: The tricuspid valve appears unchanged from pre-bypass. _ Pulmonic Valve: The pulmonic valve appears unchanged from pre-bypass. _ Interatrial Septum: The interatrial septum appears unchanged from pre-bypass. _ Pericardium: The pericardium appears unchanged from pre-bypass. _ Comments: S/P AVR CABG. No new or worsening wall motion or valvular abnormalities post procedure. 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 Seated appropriately without rock or leak. Gradients WNL.  PRE-OP FINDINGS Left Ventricle: The left ventricle has normal systolic function, with an ejection fraction of 60-65%. The cavity size was normal. Left ventricular diastolic parameters are consistent with Grade I diastolic dysfunction (impaired relaxation).   Right Ventricle: The right ventricle has normal systolic function. The cavity was normal. There is no increase in right ventricular wall thickness. Right ventricular systolic pressure is normal.  Left Atrium: Left atrial size was normal in size. No left atrial/left atrial appendage thrombus was detected. Left atrial appendage velocity is normal at greater than 40 cm/s.  Right Atrium: Right atrial size was normal in size.  Interatrial Septum: No atrial level shunt detected by color flow Doppler. The interatrial septum appears to be lipomatous. There is no evidence of a patent foramen  ovale.  Pericardium: There is no evidence of pericardial effusion. There is no pleural effusion.  Mitral Valve: The mitral valve is normal in structure. Mitral valve regurgitation is trivial by color flow Doppler. There is no evidence of mitral valve vegetation. There is No evidence of mitral stenosis.  Tricuspid Valve: The tricuspid valve was normal in structure. Tricuspid valve regurgitation is trivial by color flow Doppler. No evidence of tricuspid stenosis is present. There is no evidence of tricuspid valve vegetation.  Aortic Valve: The aortic valve is tricuspid. Aortic valve regurgitation is moderate to severe. The jet is eccentric anteriorly directed. There is mild stenosis of the aortic valve, with a calculated valve area of 1.40 cm. AI PHT , VC .7cm, diastolic reversal descending thoracic aorta, EROA PISA .32cm2.  Pulmonic Valve: The pulmonic valve was normal in structure, with normal. No evidence of pumonic stenosis. Pulmonic valve regurgitation is mild by color flow Doppler.   Aorta: The aortic root and ascending aorta are normal in size and structure.  Pulmonary Artery: Norva Purl catheter present on the right. The pulmonary artery is of normal size.  Shunts: There is no evidence of an atrial septal defect.  +--------------+--------++ LEFT VENTRICLE         +--------------+--------++ PLAX 2D                +--------------+--------++ LVIDd:        4.80 cm  +--------------+--------++ LVIDs:        3.00 cm  +--------------+--------++ LVOT diam:    2.00 cm  +--------------+--------++ LV SV:        73 ml    +--------------+--------++ LV SV Index:  40.86    +--------------+--------++ LVOT Area:    3.14 cm +--------------+--------++                        +--------------+--------++  +------------------+------------++  AORTIC VALVE                   +------------------+------------++ AV Area (Vmax):   1.34 cm      +------------------+------------++ AV Area (Vmean):  1.36 cm     +------------------+------------++ AV Area (VTI):    1.40 cm     +------------------+------------++ AV Vmax:          279.00 cm/s  +------------------+------------++ AV Vmean:         193.000 cm/s +------------------+------------++ AV VTI:           0.601 m      +------------------+------------++ AV Peak Grad:     31.1 mmHg    +------------------+------------++ AV Mean Grad:     17.0 mmHg    +------------------+------------++ LVOT Vmax:        119.00 cm/s  +------------------+------------++ LVOT Vmean:       83.800 cm/s  +------------------+------------++ LVOT VTI:         0.267 m      +------------------+------------++ LVOT/AV VTI ratio:0.44         +------------------+------------++  +-------------+-------++ AORTA                +-------------+-------++ Ao Root diam:3.10 cm +-------------+-------++ Ao STJ diam: 2.4 cm  +-------------+-------++ Ao Asc diam: 3.20 cm +-------------+-------++  +--------------+----------++ MITRAL VALVE              +--------------+-------+ +--------------+----------++  SHUNTS                MV Area (PHT):3.63 cm    +--------------+-------+ +--------------+----------++  Systemic VTI: 0.27 m  MV Peak grad: 3.0 mmHg    +--------------+-------+ +--------------+----------++  Systemic Diam:2.00 cm MV Mean grad: 1.0 mmHg    +--------------+-------+ +--------------+----------++ MV Vmax:      0.86 m/s   +--------------+----------++ MV Vmean:     53.1 cm/s  +--------------+----------++ MV PHT:       60.61 msec +--------------+----------++ MV Decel Time:209 msec   +--------------+----------++ +--------------+----------++ MV E velocity:63.50 cm/s +--------------+----------++ MV A velocity:80.40 cm/s +--------------+----------++ MV E/A ratio: 0.79        +--------------+----------++   Cordella Fix Electronically signed by Cordella Fix Signature Date/Time: 07/06/2023/3:06:06 PM    Final  MONITORS  LONG TERM MONITOR (3-14 DAYS) 04/23/2022  Narrative Patch Wear Time:  13 days and 22 hours (2023-08-02T11:41:54-0400 to 2023-08-16T10:11:23-0400)  Patient had a min HR of 52 bpm, max HR of 122 bpm, and avg HR of 68 bpm. Predominant underlying rhythm was Sinus Rhythm.  There were no sinus pauses of 3 seconds or greater and no episodes of second or third-degree AV nodal block.  There were no triggered or diary events.  4 Supraventricular Tachycardia runs occurred, the run with the fastest interval lasting 4 beats with a max rate of 122 bpm, the longest lasting 6 beats with an avg rate of 94 bpm. Isolated SVEs were rare (<1.0%), SVE Couplets were rare (<1.0%), and SVE Triplets were rare (<1.0%).  Isolated VEs were rare (<1.0%), and no VE Couplets or VE Triplets were present. Ventricular Trigeminy was present.  CT SCANS  CT CORONARY MORPH W/CTA COR W/SCORE 04/23/2023  Addendum 05/09/2023 10:39 AM ADDENDUM REPORT: 05/09/2023 10:37  EXAM: OVER-READ INTERPRETATION  CT CHEST  The following report is an over-read performed by radiologist Dr. Fonda Mom [facility] on 05/09/2023. This over-read does not include interpretation of cardiac or coronary anatomy or pathology. The coronary CTA interpretation by the cardiologist is attached.  COMPARISON:  None.  FINDINGS: Cardiovascular:  See findings discussed in the body of the report.  Mediastinum/Nodes: No suspicious adenopathy identified. Imaged mediastinal structures are unremarkable.  Lungs/Pleura: Imaged lungs are clear. No pleural effusion or pneumothorax.  Upper Abdomen: No acute abnormality.  Musculoskeletal: No chest wall abnormality. There are thoracic degenerative changes.  IMPRESSION: No significant extracardiac incidental findings  identified.   Electronically Signed By: Fonda Field M.D. On: 05/09/2023 10:37  Narrative CLINICAL DATA:  Chest pain  EXAM: Cardiac/Coronary CTA  TECHNIQUE: A non-contrast, gated CT scan was obtained with axial slices of 3 mm through the heart for calcium  scoring. Calcium  scoring was performed using the Agatston method. A 120 kV prospective, gated, contrast cardiac scan was obtained. Gantry rotation speed was 250 msecs and collimation was 0.6 mm. Two sublingual nitroglycerin  tablets (0.8 mg) were given. The 3D data set was reconstructed in 5% intervals of the 35-75% of the R-R cycle. Diastolic phases were analyzed on a dedicated workstation using MPR, MIP, and VRT modes. The patient received 95 cc of contrast.  FINDINGS: Image quality: Fair. Significant blooming artifact, respiratory motion.  Noise artifact is: Mild  Coronary Arteries:  Normal coronary origin.  Right dominance.  Left main: The left main is a large caliber vessel with a normal take off from the left coronary cusp that bifurcates to form a left anterior descending artery and a left circumflex artery. There is mild calcified plaque in the distal LM with associated stenosis of 25-49%.  Left anterior descending artery: The LAD gives off 2 patent diagonal branches. There is diffuse mild to moderate calcified plaque in the proximal and mid LAD with associated stenosis of 25-49% but at some places could be > 50%. This may be overestimated in the setting of blooming artifact.  Left circumflex artery: The LCX is non-dominant and gives off 1 patent obtuse marginal branch. There is diffuse calcified plaque in the ostial, proximal and mid LCx with associated stenosis of 25-49% with a possible moderate to severe focal stenosis of at least 50-69% and possibly >70% in the mid LCx.  Right coronary artery: The RCA is dominant with normal take off from the right coronary cusp. The RCA terminates as a PDA and  right posterolateral branch without evidence of plaque or stenosis. There is mild scattered calcified plaque in the throughout RCA with associated stenosis of 25-49%. There is moderate calcified plaque in the mid RCA with associated stenosis of 50-69%.  Right Atrium: Right atrial size is within normal limits.  Right Ventricle: The right ventricular cavity is within normal limits.  Left Atrium: Left atrial size is normal in size with no left atrial appendage filling defect.  Left Ventricle: The ventricular cavity size is within normal limits.  Pulmonary arteries: Normal in size.  Pulmonary veins: Normal pulmonary venous drainage.  Pericardium: Normal thickness without significant effusion or calcium  present.  Cardiac valves: The aortic valve is trileaflet without significant calcification. The mitral valve is normal without significant calcification.  Aorta: Normal caliber with scattered calcifications.  Extra-cardiac findings: See attached radiology report for non-cardiac structures.  IMPRESSION: 1. Coronary calcium  score of 812. This was 60th percentile for age-, sex, and race-matched controls.  2. Total plaque volume 663 mm3 which is 40th percentile for age- and sex-matched controls (calcified plaque 88 mm3; non-calcified plaque 575 mm3). TPV is severe.  3. Normal coronary origin with right dominance.  4. Mild to moderate atherosclerosis. 25-49% distal LM/proximal to mid LAD. 25-49% ostial/proximal/mid LCx and diffusely throughout the RCA. Possible calcified plaque >50% in  the mid RCA/LCx and LAD.  5. Recommend preventive therapy and risk factor modification.  6. This study has been submitted for FFR analysis.  RECOMMENDATIONS: 1. CAD-RADS 0: No evidence of CAD (0%). Consider non-atherosclerotic causes of chest pain.  2. CAD-RADS 1: Minimal non-obstructive CAD (0-24%). Consider non-atherosclerotic causes of chest pain. Consider preventive therapy and risk  factor modification.  3. CAD-RADS 2: Mild non-obstructive CAD (25-49%). Consider non-atherosclerotic causes of chest pain. Consider preventive therapy and risk factor modification.  4. CAD-RADS 3: Moderate stenosis. Consider symptom-guided anti-ischemic pharmacotherapy as well as risk factor modification per guideline directed care. Additional analysis with CT FFR will be submitted.  5. CAD-RADS 4: Severe stenosis. (70-99% or > 50% left main). Cardiac catheterization or CT FFR is recommended. Consider symptom-guided anti-ischemic pharmacotherapy as well as risk factor modification per guideline directed care. Invasive coronary angiography recommended with revascularization per published guideline statements.  6. CAD-RADS 5: Total coronary occlusion (100%). Consider cardiac catheterization or viability assessment. Consider symptom-guided anti-ischemic pharmacotherapy as well as risk factor modification per guideline directed care.  7. CAD-RADS N: Non-diagnostic study. Obstructive CAD can't be excluded. Alternative evaluation is recommended.  Wilbert Bihari, MD  Electronically Signed: By: Wilbert Bihari M.D. On: 04/30/2023 19:47  CARDIAC MRI  MR CARDIAC MORPHOLOGY W WO CONTRAST 03/21/2022  Narrative CLINICAL DATA:  Evaluate aortic regurgitation  EXAM: CARDIAC MRI  TECHNIQUE: The patient was scanned on a 1.5 Tesla Siemens magnet. A dedicated cardiac coil was used. Functional imaging was done using Fiesta sequences. 2,3, and 4 chamber views were done to assess for RWMA's. Modified Simpson's rule using a short axis stack was used to calculate an ejection fraction on a dedicated work [employer]. The patient received 10 cc of Gadavist . After 10 minutes inversion recovery sequences were used to assess for infiltration and scar tissue. Phase contrast velocity mapping performed.  CONTRAST:  10 cc  of Gadavist   FINDINGS: 1. Normal left ventricular size,  thickness and systolic function (LVEF = 54%). There are no regional wall motion abnormalities.  There is no late gadolinium enhancement in the left ventricular myocardium.  LVEDV: 123 ml  LVESV: 57 ml  SV: 66 ml  Myocardial mass: 86 g  2. Normal right ventricular size, thickness and systolic function (RVEF = 63%). There are no regional wall motion abnormalities.  3.  Normal left and right atrial size.  4. Normal size of the aortic root, ascending aorta and pulmonary artery.  5. Moderate aortic regurgitation. Aortic valve measurements: regurgitant fraction = 37%, regurgitant volume = 24 ml  Mild tricuspid regurgitation.  6.  Normal pericardium.  No pericardial effusion.  IMPRESSION: 1.  Normal Biventricular size and function.  LVEF 54%, RVEF 63%.  2.  Moderate aortic valve regurgitation.  Regurgitant fraction 37%.  3.  No evidence of late gadolinium enhancement or infiltration.  4.  Mild tricuspid regurgitation.  Redell Cave   Electronically Signed By: Redell Cave M.D. On: 03/26/2022 17:40         Risk Assessment/Calculations:    CHA2DS2-VASc Score = 7   This indicates a 11.2% annual risk of stroke. The patient's score is based upon: CHF History: 0 HTN History: 1 Diabetes History: 1 Stroke History: 2 Vascular Disease History: 1 Age Score: 2 Gender Score: 0            Physical Exam:   VS:  BP 120/60 (BP Location: Left Arm, Patient Position: Sitting, Cuff Size: Normal)   Pulse 66   Ht  5' 7 (1.702 m)   Wt 134 lb (60.8 kg)   SpO2 99%   BMI 20.99 kg/m    Wt Readings from Last 3 Encounters:  09/02/23 134 lb (60.8 kg)  08/29/23 135 lb (61.2 kg)  08/18/23 139 lb (63 kg)    GEN: Well nourished, well developed in no acute distress NECK: No JVD; No carotid bruits CARDIAC: RRR, no murmurs, rubs, gallops RESPIRATORY:  diminished but clear ABDOMEN: Soft, non-tender, non-distended EXTREMITIES:  No edema; No deformity    ASSESSMENT AND  PLAN: .   CAD - s/p CABG x 1 LIMA > LAD. Stable with no anginal symptoms. No indication for ischemic evaluation.  Continue aspirin  81 mg daily, continue metoprolol  100 mg daily. Still working with home PT/OT, plans to start cardiac rehab soon.   AVR- AVR with 21 mm Inspiris aortic valve. Will need SBE for any dental procedures.   PAF/hypercoagulable state -maintaining sinus rhythm, CHA2DS2-VASc score of 7, continue Eliquis  5 mg twice daily--no indication for dose reduction--denies hematuria, hemoptysis, hematochezia, continue metoprolol  100 mg daily.  CHB s/p PPM - follows with EP.   CVA - neurologically intact, will see Dr. Rosemarie in February  Hypertension-blood pressure is well controlled today at 120/60. Norvasc  and Cardizem  have been held.  Pedal edema - will restart his lasix  20 mg daily x 3 days, then PRN for wt gain of 2 lbs/1 day or pedal edema, start potassium 20 mEq to take on the days he takes lasix . Repeat CMET, proBNP today.     Dispo:Keep follow up with Dr. Monetta on 09/17/23.  Signed, Delon JAYSON Hoover, NP

## 2023-09-02 ENCOUNTER — Encounter: Payer: Self-pay | Admitting: Cardiology

## 2023-09-02 ENCOUNTER — Ambulatory Visit: Payer: Medicare Other | Attending: Cardiology | Admitting: Cardiology

## 2023-09-02 VITALS — BP 120/60 | HR 66 | Ht 67.0 in | Wt 134.0 lb

## 2023-09-02 DIAGNOSIS — Z48812 Encounter for surgical aftercare following surgery on the circulatory system: Secondary | ICD-10-CM | POA: Diagnosis not present

## 2023-09-02 DIAGNOSIS — I351 Nonrheumatic aortic (valve) insufficiency: Secondary | ICD-10-CM | POA: Diagnosis not present

## 2023-09-02 DIAGNOSIS — F329 Major depressive disorder, single episode, unspecified: Secondary | ICD-10-CM | POA: Diagnosis not present

## 2023-09-02 DIAGNOSIS — Z85828 Personal history of other malignant neoplasm of skin: Secondary | ICD-10-CM | POA: Diagnosis not present

## 2023-09-02 DIAGNOSIS — I442 Atrioventricular block, complete: Secondary | ICD-10-CM | POA: Diagnosis not present

## 2023-09-02 DIAGNOSIS — N200 Calculus of kidney: Secondary | ICD-10-CM | POA: Diagnosis not present

## 2023-09-02 DIAGNOSIS — Z7902 Long term (current) use of antithrombotics/antiplatelets: Secondary | ICD-10-CM | POA: Diagnosis not present

## 2023-09-02 DIAGNOSIS — I480 Paroxysmal atrial fibrillation: Secondary | ICD-10-CM

## 2023-09-02 DIAGNOSIS — Z8673 Personal history of transient ischemic attack (TIA), and cerebral infarction without residual deficits: Secondary | ICD-10-CM | POA: Diagnosis not present

## 2023-09-02 DIAGNOSIS — I2510 Atherosclerotic heart disease of native coronary artery without angina pectoris: Secondary | ICD-10-CM

## 2023-09-02 DIAGNOSIS — Z952 Presence of prosthetic heart valve: Secondary | ICD-10-CM

## 2023-09-02 DIAGNOSIS — Z950 Presence of cardiac pacemaker: Secondary | ICD-10-CM | POA: Diagnosis not present

## 2023-09-02 DIAGNOSIS — Z7952 Long term (current) use of systemic steroids: Secondary | ICD-10-CM | POA: Diagnosis not present

## 2023-09-02 DIAGNOSIS — I10 Essential (primary) hypertension: Secondary | ICD-10-CM

## 2023-09-02 DIAGNOSIS — J45909 Unspecified asthma, uncomplicated: Secondary | ICD-10-CM | POA: Diagnosis not present

## 2023-09-02 DIAGNOSIS — Z951 Presence of aortocoronary bypass graft: Secondary | ICD-10-CM

## 2023-09-02 DIAGNOSIS — Z7951 Long term (current) use of inhaled steroids: Secondary | ICD-10-CM | POA: Diagnosis not present

## 2023-09-02 DIAGNOSIS — Z7984 Long term (current) use of oral hypoglycemic drugs: Secondary | ICD-10-CM | POA: Diagnosis not present

## 2023-09-02 DIAGNOSIS — D6859 Other primary thrombophilia: Secondary | ICD-10-CM

## 2023-09-02 DIAGNOSIS — D649 Anemia, unspecified: Secondary | ICD-10-CM | POA: Diagnosis not present

## 2023-09-02 NOTE — Patient Instructions (Signed)
 Medication Instructions:  Your physician recommends that you continue on your current medications as directed. Please refer to the Current Medication list given to you today.  *If you need a refill on your cardiac medications before your next appointment, please call your pharmacy*   Lab Work: None Ordered If you have labs (blood work) drawn today and your tests are completely normal, you will receive your results only by: MyChart Message (if you have MyChart) OR A paper copy in the mail If you have any lab test that is abnormal or we need to change your treatment, we will call you to review the results.   Testing/Procedures: None Ordered   Follow-Up: At [facility], you and your health needs are our priority.  As part of our continuing mission to provide you with exceptional heart care, we have created designated Provider Care Teams.  These Care Teams include your primary Cardiologist (physician) and Advanced Practice Providers (APPs -  Physician Assistants and Nurse Practitioners) who all work together to provide you with the care you need, when you need it.  We recommend signing up for the patient portal called MyChart.  Sign up information is provided on this After Visit Summary.  MyChart is used to connect with patients for Virtual Visits (Telemedicine).  Patients are able to view lab/test results, encounter notes, upcoming appointments, etc.  Non-urgent messages can be sent to your provider as well.   To learn more about what you can do with MyChart, go to [email or web address].    Your next appointment:   Keep Follow up with Dr. Monetta

## 2023-09-04 DIAGNOSIS — Z950 Presence of cardiac pacemaker: Secondary | ICD-10-CM | POA: Diagnosis not present

## 2023-09-04 DIAGNOSIS — Z8673 Personal history of transient ischemic attack (TIA), and cerebral infarction without residual deficits: Secondary | ICD-10-CM | POA: Diagnosis not present

## 2023-09-04 DIAGNOSIS — Z7902 Long term (current) use of antithrombotics/antiplatelets: Secondary | ICD-10-CM | POA: Diagnosis not present

## 2023-09-04 DIAGNOSIS — Z8546 Personal history of malignant neoplasm of prostate: Secondary | ICD-10-CM | POA: Diagnosis not present

## 2023-09-04 DIAGNOSIS — N200 Calculus of kidney: Secondary | ICD-10-CM | POA: Diagnosis not present

## 2023-09-04 DIAGNOSIS — Z7951 Long term (current) use of inhaled steroids: Secondary | ICD-10-CM | POA: Diagnosis not present

## 2023-09-04 DIAGNOSIS — Z85038 Personal history of other malignant neoplasm of large intestine: Secondary | ICD-10-CM | POA: Diagnosis not present

## 2023-09-04 DIAGNOSIS — D649 Anemia, unspecified: Secondary | ICD-10-CM | POA: Diagnosis not present

## 2023-09-04 DIAGNOSIS — Z85828 Personal history of other malignant neoplasm of skin: Secondary | ICD-10-CM | POA: Diagnosis not present

## 2023-09-04 DIAGNOSIS — Z48812 Encounter for surgical aftercare following surgery on the circulatory system: Secondary | ICD-10-CM | POA: Diagnosis not present

## 2023-09-04 DIAGNOSIS — J45909 Unspecified asthma, uncomplicated: Secondary | ICD-10-CM | POA: Diagnosis not present

## 2023-09-04 DIAGNOSIS — F329 Major depressive disorder, single episode, unspecified: Secondary | ICD-10-CM | POA: Diagnosis not present

## 2023-09-04 DIAGNOSIS — Z7984 Long term (current) use of oral hypoglycemic drugs: Secondary | ICD-10-CM | POA: Diagnosis not present

## 2023-09-04 DIAGNOSIS — I2510 Atherosclerotic heart disease of native coronary artery without angina pectoris: Secondary | ICD-10-CM | POA: Diagnosis not present

## 2023-09-04 DIAGNOSIS — Z951 Presence of aortocoronary bypass graft: Secondary | ICD-10-CM | POA: Diagnosis not present

## 2023-09-04 DIAGNOSIS — I10 Essential (primary) hypertension: Secondary | ICD-10-CM | POA: Diagnosis not present

## 2023-09-04 DIAGNOSIS — Z7952 Long term (current) use of systemic steroids: Secondary | ICD-10-CM | POA: Diagnosis not present

## 2023-09-04 DIAGNOSIS — I351 Nonrheumatic aortic (valve) insufficiency: Secondary | ICD-10-CM | POA: Diagnosis not present

## 2023-09-05 DIAGNOSIS — Z85828 Personal history of other malignant neoplasm of skin: Secondary | ICD-10-CM | POA: Diagnosis not present

## 2023-09-05 DIAGNOSIS — Z7952 Long term (current) use of systemic steroids: Secondary | ICD-10-CM | POA: Diagnosis not present

## 2023-09-05 DIAGNOSIS — Z950 Presence of cardiac pacemaker: Secondary | ICD-10-CM | POA: Diagnosis not present

## 2023-09-05 DIAGNOSIS — D649 Anemia, unspecified: Secondary | ICD-10-CM | POA: Diagnosis not present

## 2023-09-05 DIAGNOSIS — J45909 Unspecified asthma, uncomplicated: Secondary | ICD-10-CM | POA: Diagnosis not present

## 2023-09-05 DIAGNOSIS — I351 Nonrheumatic aortic (valve) insufficiency: Secondary | ICD-10-CM | POA: Diagnosis not present

## 2023-09-05 DIAGNOSIS — Z951 Presence of aortocoronary bypass graft: Secondary | ICD-10-CM | POA: Diagnosis not present

## 2023-09-05 DIAGNOSIS — N200 Calculus of kidney: Secondary | ICD-10-CM | POA: Diagnosis not present

## 2023-09-05 DIAGNOSIS — Z7951 Long term (current) use of inhaled steroids: Secondary | ICD-10-CM | POA: Diagnosis not present

## 2023-09-05 DIAGNOSIS — Z7984 Long term (current) use of oral hypoglycemic drugs: Secondary | ICD-10-CM | POA: Diagnosis not present

## 2023-09-05 DIAGNOSIS — I10 Essential (primary) hypertension: Secondary | ICD-10-CM | POA: Diagnosis not present

## 2023-09-05 DIAGNOSIS — Z8673 Personal history of transient ischemic attack (TIA), and cerebral infarction without residual deficits: Secondary | ICD-10-CM | POA: Diagnosis not present

## 2023-09-05 DIAGNOSIS — I2510 Atherosclerotic heart disease of native coronary artery without angina pectoris: Secondary | ICD-10-CM | POA: Diagnosis not present

## 2023-09-05 DIAGNOSIS — Z7902 Long term (current) use of antithrombotics/antiplatelets: Secondary | ICD-10-CM | POA: Diagnosis not present

## 2023-09-05 DIAGNOSIS — F329 Major depressive disorder, single episode, unspecified: Secondary | ICD-10-CM | POA: Diagnosis not present

## 2023-09-05 DIAGNOSIS — Z48812 Encounter for surgical aftercare following surgery on the circulatory system: Secondary | ICD-10-CM | POA: Diagnosis not present

## 2023-09-06 DIAGNOSIS — Z7952 Long term (current) use of systemic steroids: Secondary | ICD-10-CM | POA: Diagnosis not present

## 2023-09-06 DIAGNOSIS — N200 Calculus of kidney: Secondary | ICD-10-CM | POA: Diagnosis not present

## 2023-09-06 DIAGNOSIS — Z951 Presence of aortocoronary bypass graft: Secondary | ICD-10-CM | POA: Diagnosis not present

## 2023-09-06 DIAGNOSIS — Z7902 Long term (current) use of antithrombotics/antiplatelets: Secondary | ICD-10-CM | POA: Diagnosis not present

## 2023-09-06 DIAGNOSIS — D649 Anemia, unspecified: Secondary | ICD-10-CM | POA: Diagnosis not present

## 2023-09-06 DIAGNOSIS — I351 Nonrheumatic aortic (valve) insufficiency: Secondary | ICD-10-CM | POA: Diagnosis not present

## 2023-09-06 DIAGNOSIS — Z7951 Long term (current) use of inhaled steroids: Secondary | ICD-10-CM | POA: Diagnosis not present

## 2023-09-06 DIAGNOSIS — Z48812 Encounter for surgical aftercare following surgery on the circulatory system: Secondary | ICD-10-CM | POA: Diagnosis not present

## 2023-09-06 DIAGNOSIS — Z950 Presence of cardiac pacemaker: Secondary | ICD-10-CM | POA: Diagnosis not present

## 2023-09-06 DIAGNOSIS — J45909 Unspecified asthma, uncomplicated: Secondary | ICD-10-CM | POA: Diagnosis not present

## 2023-09-06 DIAGNOSIS — Z85828 Personal history of other malignant neoplasm of skin: Secondary | ICD-10-CM | POA: Diagnosis not present

## 2023-09-06 DIAGNOSIS — F329 Major depressive disorder, single episode, unspecified: Secondary | ICD-10-CM | POA: Diagnosis not present

## 2023-09-06 DIAGNOSIS — I2510 Atherosclerotic heart disease of native coronary artery without angina pectoris: Secondary | ICD-10-CM | POA: Diagnosis not present

## 2023-09-06 DIAGNOSIS — I10 Essential (primary) hypertension: Secondary | ICD-10-CM | POA: Diagnosis not present

## 2023-09-06 DIAGNOSIS — Z7984 Long term (current) use of oral hypoglycemic drugs: Secondary | ICD-10-CM | POA: Diagnosis not present

## 2023-09-06 DIAGNOSIS — Z8673 Personal history of transient ischemic attack (TIA), and cerebral infarction without residual deficits: Secondary | ICD-10-CM | POA: Diagnosis not present

## 2023-09-09 ENCOUNTER — Ambulatory Visit: Payer: Medicare Other | Attending: Cardiology | Admitting: Cardiology

## 2023-09-09 ENCOUNTER — Encounter: Payer: Self-pay | Admitting: Cardiology

## 2023-09-09 VITALS — BP 110/82 | HR 64 | Ht 67.0 in | Wt 133.8 lb

## 2023-09-09 DIAGNOSIS — I10 Essential (primary) hypertension: Secondary | ICD-10-CM

## 2023-09-09 DIAGNOSIS — I442 Atrioventricular block, complete: Secondary | ICD-10-CM | POA: Diagnosis not present

## 2023-09-09 DIAGNOSIS — Z7901 Long term (current) use of anticoagulants: Secondary | ICD-10-CM

## 2023-09-09 DIAGNOSIS — Z952 Presence of prosthetic heart valve: Secondary | ICD-10-CM

## 2023-09-09 DIAGNOSIS — Z950 Presence of cardiac pacemaker: Secondary | ICD-10-CM

## 2023-09-09 DIAGNOSIS — I951 Orthostatic hypotension: Secondary | ICD-10-CM

## 2023-09-09 DIAGNOSIS — E782 Mixed hyperlipidemia: Secondary | ICD-10-CM

## 2023-09-09 DIAGNOSIS — I480 Paroxysmal atrial fibrillation: Secondary | ICD-10-CM

## 2023-09-09 DIAGNOSIS — I2510 Atherosclerotic heart disease of native coronary artery without angina pectoris: Secondary | ICD-10-CM

## 2023-09-09 MED ORDER — MIDODRINE HCL 5 MG PO TABS: 5.0000 mg | ORAL_TABLET | Freq: Two times a day (BID) | ORAL | 3 refills | Status: DC

## 2023-09-09 MED ORDER — METOPROLOL SUCCINATE ER 50 MG PO TB24: 50.0000 mg | ORAL_TABLET | Freq: Every day | ORAL | 3 refills | Status: DC

## 2023-09-09 MED ORDER — TAMSULOSIN HCL 0.4 MG PO CAPS: 0.4000 mg | ORAL_CAPSULE | Freq: Every day | ORAL | 3 refills | Status: DC

## 2023-09-09 NOTE — Patient Instructions (Addendum)
 Medication Instructions:  Your physician has recommended you make the following change in your medication:   START: Midodrine  5 mg twice daily (morning and early afternoon) START: Tamsulosin  0.4 mg daily START: Toprol  XL 50 mg daily  *If you need a refill on your cardiac medications before your next appointment, please call your pharmacy*   Lab Work: None If you have labs (blood work) drawn today and your tests are completely normal, you will receive your results only by: MyChart Message (if you have MyChart) OR A paper copy in the mail If you have any lab test that is abnormal or we need to change your treatment, we will call you to review the results.   Testing/Procedures: None   Follow-Up: At [facility], you and your health needs are our priority.  As part of our continuing mission to provide you with exceptional heart care, we have created designated Provider Care Teams.  These Care Teams include your primary Cardiologist (physician) and Advanced Practice Providers (APPs -  Physician Assistants and Nurse Practitioners) who all work together to provide you with the care you need, when you need it.  We recommend signing up for the patient portal called MyChart.  Sign up information is provided on this After Visit Summary.  MyChart is used to connect with patients for Virtual Visits (Telemedicine).  Patients are able to view lab/test results, encounter notes, upcoming appointments, etc.  Non-urgent messages can be sent to your provider as well.   To learn more about what you can do with MyChart, go to [email or web address].    Your next appointment:   4 week(s)  Provider:   Redell Leiter, MD    Other Instructions None

## 2023-09-09 NOTE — Progress Notes (Signed)
 Cardiology Office Note:    Date:  09/09/2023   ID:  [name], DOB Mar 07, 1941, MRN [id number]  PCP:  [name], PA  Cardiologist:  Redell Leiter, MD    Referring MD: [name], [name]    ASSESSMENT:    1. S/P AVR (aortic valve replacement)   2. Coronary artery disease involving native coronary artery of native heart without angina pectoris   3. Heart block AV complete (HCC)   4. Presence of permanent cardiac pacemaker   5. Paroxysmal atrial fibrillation (HCC)   6. Chronic anticoagulation   7. Essential hypertension   8. Mixed hyperlipidemia    PLAN:    In order of problems listed above:  Overall Bruce Nunez is slowly improving he has been limited now by symptomatic orthostatic hypotension reduce his alpha-blocker reduce his beta-blocker and add low-dose midodrine  his wife will trend blood pressure sitting and standing and continue his home PT and OT Normal AVR function on echocardiogram postoperative Continue his current antithrombotic therapy with atrial fibrillation Eliquis  weight continues to fall we will reduce dose to 2 and half milligrams twice daily and a threshold of 132 pounds Stable pacemaker function followed in our device clinic\ Continue his current statin Stable CAD   Next appointment: 6 weeks   Medication Adjustments/Labs and Tests Ordered: Current medicines are reviewed at length with the patient today.  Concerns regarding medicines are outlined above.  No orders of the defined types were placed in this encounter.  Meds ordered this encounter  Medications   midodrine  (PROAMATINE ) 5 MG tablet    Sig: Take 1 tablet (5 mg total) by mouth 2 (two) times daily. In the morning and early afternoon    Dispense:  180 tablet    Refill:  3   tamsulosin  (FLOMAX ) 0.4 MG CAPS capsule    Sig: Take 1 capsule (0.4 mg total) by mouth daily.    Dispense:  90 capsule    Refill:  3   metoprolol  succinate (TOPROL -XL) 50 MG 24 hr tablet    Sig: Take 1 tablet (50 mg  total) by mouth daily. Take with or immediately following a meal.    Dispense:  90 tablet    Refill:  3     History of Present Illness:    Bruce Nunez is a 83 y.o. male with a hx of surgical AVR CABG perioperative stroke heart block and pacemaker last seen 09/02/2023.  Has a history of aortic regurgitation with surgical AVR and CABG 07/05/2023.  His other problems include heart block with a permanent pacemaker paroxysmal atrial fibrillation stroke hypertension hyperlipidemia.  Postoperatively required skilled nursing care with stroke recovery.  He then had admission to the hospital with symptomatic hypotension related to medications particularly calcium  channel blocker that was discontinued.  He was seen by CT surgery and follow 08/28/1998 at that time was felt to be doing well he raised the issue of whether he may need an outpatient heart rhythm monitor.  He has not had a pacemaker interrogation as an outpatient yet. Compliance with diet, lifestyle and medications: Yes  He is here today as he continues to have episodes of orthostatic lightheadedness and weakness Systolic blood pressure is 90 or less 1 minute of standing He is no longer taking a loop diuretic he is taking alpha-blocker twice a day for urinary hesitancy and a reduced to once daily As he remains symptomatic I will place him on low-dose midodrine  5 mg twice daily and his wife will track blood pressure  sitting and standing Do not want to see his recovery from his prolonged hospitalization skilled nursing care or rehab interfered with by symptomatic hypotension He remains weak and tremulous but is not having edema shortness of breath chest pain palpitation or syncope He has the home monitor set up for his device and he has remote checks and has an appointment to follow-up with the EP in our office in 90 days He tolerates his anticoagulant without bleeding if his weight continues to fall I will reduce his dose to 2 and half milligrams  twice daily I reviewed his surgical note he did not have left atrial appendage occlusion Past Medical History:  Diagnosis Date   Anemia    recent iron supplement   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   Aortic regurgitation 03/12/2017   Aortic stenosis 04/29/2019   Asthma    childhood-   2013 bronchitis w/wheezing   Complication of anesthesia     ether made me sick - woke up during colonoscopy   Diabetes mellitus without complication (HCC)    Endotracheally intubated 07/05/2023   Essential hypertension 03/12/2017   Frequency of urination    GERD (gastroesophageal reflux disease)    occasional   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   Heart murmur    Hemorrhoids    History of kidney stones    Hx of colon cancer, stage I    Hx of nonmelanoma skin cancer    Hx of peptic ulcer    as teenager   Hx of transfusion    Hyperlipidemia    Hypertension    Inguinal hernia    PAF (paroxysmal atrial fibrillation) (HCC) 08/20/2023   PONV (postoperative nausea and vomiting)    only when had Ether as Anesthesia drug   Prostate cancer (HCC) 08/15/2012   also Hx colon cancer / Hx skin cancer   Right inguinal hernia 05/08/2016   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   S/P right inguinal hernia repair Sept 2015 05/17/2014   Recurrent right indirect  Hernia treated with robotic TAPP repair with enterolysis Sept 2017   Stroke [facility]) 03/28/2022   Syncope 08/17/2023    Current Medications: Current Meds  Medication Sig   acetaminophen  (TYLENOL ) 325 MG tablet Take 1-2 tablets (325-650 mg total) by mouth every 6 (six) hours as needed for mild pain (pain score 1-3).   albuterol  (PROVENTIL  HFA;VENTOLIN  HFA) 108 (90 BASE) MCG/ACT inhaler Inhale 2 puffs into the lungs every 6 (six) hours as needed for shortness of breath.   apixaban  (ELIQUIS ) 5 MG TABS tablet Take 1 tablet (5 mg total) by mouth 2 (two) times daily.   budesonide -formoterol  (SYMBICORT) 160-4.5  MCG/ACT inhaler Inhale 2 puffs into the lungs 2 (two) times daily.   cyanocobalamin  (,VITAMIN B-12,) 1000 MCG/ML injection Inject 1 mL into the skin every 30 (thirty) days.   empagliflozin  (JARDIANCE ) 10 MG TABS tablet Take 1 tablet (10 mg total) by mouth daily.   fexofenadine (ALLEGRA) 180 MG tablet Take 180 mg by mouth daily.   fluticasone  (FLONASE ) 50 MCG/ACT nasal spray Place 2 sprays into both nostrils 2 (two) times daily.   levothyroxine  (SYNTHROID ) 75 MCG tablet Take 75 mcg by mouth daily before breakfast.   metoprolol  succinate (TOPROL -XL) 50 MG 24 hr tablet Take 1 tablet (50 mg total) by mouth daily. Take with or immediately following a meal.   midodrine  (PROAMATINE ) 5 MG tablet Take 1 tablet (5 mg total) by mouth 2 (two) times  daily. In the morning and early afternoon   omeprazole  (PRILOSEC ) 20 MG capsule Take 20 mg by mouth in the morning.   potassium chloride  SA (KLOR-CON  M) 20 MEQ tablet Take 1 tablet (20 mEq total) by mouth as needed. Take one tablet once a day for three days, then take one tablet daily as needed when you take your Lasix .   rosuvastatin  (CRESTOR ) 20 MG tablet Take 1 tablet (20 mg total) by mouth daily.   solifenacin (VESICARE) 10 MG tablet Take 10 mg by mouth daily.   tamsulosin  (FLOMAX ) 0.4 MG CAPS capsule Take 1 capsule (0.4 mg total) by mouth daily.      EKGs/Labs/Other Studies Reviewed:    The following studies were reviewed today:   Recent Labs: 07/29/2023: ALT 15; NT-Pro BNP 917 08/18/2023: Magnesium  1.9; TSH 0.453 08/20/2023: BUN 17; Creatinine, Ser 1.09; Hemoglobin 11.2; Platelets 269; Potassium 3.9; Sodium 134   Recent Lipid Panel    Component Value Date/Time   CHOL 74 07/11/2023 0437   CHOL 95 (L) 03/21/2023 1059   TRIG 149 07/11/2023 0437   HDL 11 (L) 07/11/2023 0437   HDL 32 (L) 03/21/2023 1059   CHOLHDL 6.7 07/11/2023 0437   VLDL 30 07/11/2023 0437   LDLCALC 33 07/11/2023 0437   LDLCALC 39 03/21/2023 1059   He had a postoperative  echocardiogram at [facility] 08/10/2023 with normal AVR function moderate LVH normal ejection fraction.  There is mild gradient across the mitral valve with degenerative calcification 3 mmHg and no mitral regurgitation.  Physical Exam:    VS:  BP 110/82   Pulse 64   Ht 5' 7 (1.702 m)   Wt 133 lb 12.8 oz (60.7 kg)   BMI 20.96 kg/m     Wt Readings from Last 3 Encounters:  09/09/23 133 lb 12.8 oz (60.7 kg)  09/02/23 134 lb (60.8 kg)  08/29/23 135 lb (61.2 kg)     GEN: He has lost weight and looks a little frail and he is tremulous standing when his blood pressure is diminished well nourished, well developed in no acute distress HEENT: Normal NECK: No JVD; No carotid bruits LYMPHATICS: No lymphadenopathy CARDIAC: RRR, no murmurs, rubs, gallops RESPIRATORY:  Clear to auscultation without rales, wheezing or rhonchi  ABDOMEN: Soft, non-tender, non-distended MUSCULOSKELETAL:  No edema; No deformity  SKIN: Warm and dry NEUROLOGIC:  Alert and oriented x 3 PSYCHIATRIC:  Normal affect    Signed, Redell Leiter, MD  09/09/2023 11:53 AM    [facility]

## 2023-09-11 DIAGNOSIS — I10 Essential (primary) hypertension: Secondary | ICD-10-CM | POA: Diagnosis not present

## 2023-09-11 DIAGNOSIS — I2510 Atherosclerotic heart disease of native coronary artery without angina pectoris: Secondary | ICD-10-CM | POA: Diagnosis not present

## 2023-09-11 DIAGNOSIS — Z48812 Encounter for surgical aftercare following surgery on the circulatory system: Secondary | ICD-10-CM | POA: Diagnosis not present

## 2023-09-11 DIAGNOSIS — J45909 Unspecified asthma, uncomplicated: Secondary | ICD-10-CM | POA: Diagnosis not present

## 2023-09-11 DIAGNOSIS — Z85828 Personal history of other malignant neoplasm of skin: Secondary | ICD-10-CM | POA: Diagnosis not present

## 2023-09-11 DIAGNOSIS — N200 Calculus of kidney: Secondary | ICD-10-CM | POA: Diagnosis not present

## 2023-09-11 DIAGNOSIS — H524 Presbyopia: Secondary | ICD-10-CM | POA: Diagnosis not present

## 2023-09-11 DIAGNOSIS — Z7951 Long term (current) use of inhaled steroids: Secondary | ICD-10-CM | POA: Diagnosis not present

## 2023-09-11 DIAGNOSIS — I351 Nonrheumatic aortic (valve) insufficiency: Secondary | ICD-10-CM | POA: Diagnosis not present

## 2023-09-11 DIAGNOSIS — Z7952 Long term (current) use of systemic steroids: Secondary | ICD-10-CM | POA: Diagnosis not present

## 2023-09-11 DIAGNOSIS — D649 Anemia, unspecified: Secondary | ICD-10-CM | POA: Diagnosis not present

## 2023-09-11 DIAGNOSIS — Z8673 Personal history of transient ischemic attack (TIA), and cerebral infarction without residual deficits: Secondary | ICD-10-CM | POA: Diagnosis not present

## 2023-09-11 DIAGNOSIS — Z951 Presence of aortocoronary bypass graft: Secondary | ICD-10-CM | POA: Diagnosis not present

## 2023-09-11 DIAGNOSIS — F329 Major depressive disorder, single episode, unspecified: Secondary | ICD-10-CM | POA: Diagnosis not present

## 2023-09-11 DIAGNOSIS — Z950 Presence of cardiac pacemaker: Secondary | ICD-10-CM | POA: Diagnosis not present

## 2023-09-11 DIAGNOSIS — Z7984 Long term (current) use of oral hypoglycemic drugs: Secondary | ICD-10-CM | POA: Diagnosis not present

## 2023-09-11 DIAGNOSIS — Z7902 Long term (current) use of antithrombotics/antiplatelets: Secondary | ICD-10-CM | POA: Diagnosis not present

## 2023-09-11 DIAGNOSIS — H40013 Open angle with borderline findings, low risk, bilateral: Secondary | ICD-10-CM | POA: Diagnosis not present

## 2023-09-16 ENCOUNTER — Other Ambulatory Visit (HOSPITAL_COMMUNITY): Payer: Self-pay

## 2023-09-17 ENCOUNTER — Ambulatory Visit: Payer: Medicare Other | Admitting: Cardiology

## 2023-09-17 ENCOUNTER — Other Ambulatory Visit (HOSPITAL_COMMUNITY): Payer: Self-pay

## 2023-09-18 ENCOUNTER — Other Ambulatory Visit (HOSPITAL_COMMUNITY): Payer: Self-pay

## 2023-09-19 ENCOUNTER — Other Ambulatory Visit (HOSPITAL_COMMUNITY): Payer: Self-pay

## 2023-09-20 DIAGNOSIS — Z7902 Long term (current) use of antithrombotics/antiplatelets: Secondary | ICD-10-CM | POA: Diagnosis not present

## 2023-09-20 DIAGNOSIS — I2510 Atherosclerotic heart disease of native coronary artery without angina pectoris: Secondary | ICD-10-CM | POA: Diagnosis not present

## 2023-09-20 DIAGNOSIS — Z7952 Long term (current) use of systemic steroids: Secondary | ICD-10-CM | POA: Diagnosis not present

## 2023-09-20 DIAGNOSIS — I10 Essential (primary) hypertension: Secondary | ICD-10-CM | POA: Diagnosis not present

## 2023-09-20 DIAGNOSIS — F329 Major depressive disorder, single episode, unspecified: Secondary | ICD-10-CM | POA: Diagnosis not present

## 2023-09-20 DIAGNOSIS — Z951 Presence of aortocoronary bypass graft: Secondary | ICD-10-CM | POA: Diagnosis not present

## 2023-09-20 DIAGNOSIS — Z7984 Long term (current) use of oral hypoglycemic drugs: Secondary | ICD-10-CM | POA: Diagnosis not present

## 2023-09-20 DIAGNOSIS — Z8673 Personal history of transient ischemic attack (TIA), and cerebral infarction without residual deficits: Secondary | ICD-10-CM | POA: Diagnosis not present

## 2023-09-20 DIAGNOSIS — Z7951 Long term (current) use of inhaled steroids: Secondary | ICD-10-CM | POA: Diagnosis not present

## 2023-09-20 DIAGNOSIS — D649 Anemia, unspecified: Secondary | ICD-10-CM | POA: Diagnosis not present

## 2023-09-20 DIAGNOSIS — Z48812 Encounter for surgical aftercare following surgery on the circulatory system: Secondary | ICD-10-CM | POA: Diagnosis not present

## 2023-09-20 DIAGNOSIS — N200 Calculus of kidney: Secondary | ICD-10-CM | POA: Diagnosis not present

## 2023-09-20 DIAGNOSIS — I351 Nonrheumatic aortic (valve) insufficiency: Secondary | ICD-10-CM | POA: Diagnosis not present

## 2023-09-20 DIAGNOSIS — Z85828 Personal history of other malignant neoplasm of skin: Secondary | ICD-10-CM | POA: Diagnosis not present

## 2023-09-20 DIAGNOSIS — Z950 Presence of cardiac pacemaker: Secondary | ICD-10-CM | POA: Diagnosis not present

## 2023-09-20 DIAGNOSIS — J45909 Unspecified asthma, uncomplicated: Secondary | ICD-10-CM | POA: Diagnosis not present

## 2023-09-21 DIAGNOSIS — N200 Calculus of kidney: Secondary | ICD-10-CM | POA: Diagnosis not present

## 2023-09-21 DIAGNOSIS — Z7984 Long term (current) use of oral hypoglycemic drugs: Secondary | ICD-10-CM | POA: Diagnosis not present

## 2023-09-21 DIAGNOSIS — Z951 Presence of aortocoronary bypass graft: Secondary | ICD-10-CM | POA: Diagnosis not present

## 2023-09-21 DIAGNOSIS — Z950 Presence of cardiac pacemaker: Secondary | ICD-10-CM | POA: Diagnosis not present

## 2023-09-21 DIAGNOSIS — F329 Major depressive disorder, single episode, unspecified: Secondary | ICD-10-CM | POA: Diagnosis not present

## 2023-09-21 DIAGNOSIS — Z8673 Personal history of transient ischemic attack (TIA), and cerebral infarction without residual deficits: Secondary | ICD-10-CM | POA: Diagnosis not present

## 2023-09-21 DIAGNOSIS — Z7902 Long term (current) use of antithrombotics/antiplatelets: Secondary | ICD-10-CM | POA: Diagnosis not present

## 2023-09-21 DIAGNOSIS — I2510 Atherosclerotic heart disease of native coronary artery without angina pectoris: Secondary | ICD-10-CM | POA: Diagnosis not present

## 2023-09-21 DIAGNOSIS — Z7952 Long term (current) use of systemic steroids: Secondary | ICD-10-CM | POA: Diagnosis not present

## 2023-09-21 DIAGNOSIS — I10 Essential (primary) hypertension: Secondary | ICD-10-CM | POA: Diagnosis not present

## 2023-09-21 DIAGNOSIS — J45909 Unspecified asthma, uncomplicated: Secondary | ICD-10-CM | POA: Diagnosis not present

## 2023-09-21 DIAGNOSIS — Z85828 Personal history of other malignant neoplasm of skin: Secondary | ICD-10-CM | POA: Diagnosis not present

## 2023-09-21 DIAGNOSIS — I351 Nonrheumatic aortic (valve) insufficiency: Secondary | ICD-10-CM | POA: Diagnosis not present

## 2023-09-21 DIAGNOSIS — Z7951 Long term (current) use of inhaled steroids: Secondary | ICD-10-CM | POA: Diagnosis not present

## 2023-09-21 DIAGNOSIS — Z48812 Encounter for surgical aftercare following surgery on the circulatory system: Secondary | ICD-10-CM | POA: Diagnosis not present

## 2023-09-21 DIAGNOSIS — D649 Anemia, unspecified: Secondary | ICD-10-CM | POA: Diagnosis not present

## 2023-09-26 DIAGNOSIS — Z950 Presence of cardiac pacemaker: Secondary | ICD-10-CM | POA: Diagnosis not present

## 2023-09-26 DIAGNOSIS — F329 Major depressive disorder, single episode, unspecified: Secondary | ICD-10-CM | POA: Diagnosis not present

## 2023-09-26 DIAGNOSIS — I351 Nonrheumatic aortic (valve) insufficiency: Secondary | ICD-10-CM | POA: Diagnosis not present

## 2023-09-26 DIAGNOSIS — Z85828 Personal history of other malignant neoplasm of skin: Secondary | ICD-10-CM | POA: Diagnosis not present

## 2023-09-26 DIAGNOSIS — I10 Essential (primary) hypertension: Secondary | ICD-10-CM | POA: Diagnosis not present

## 2023-09-26 DIAGNOSIS — Z8673 Personal history of transient ischemic attack (TIA), and cerebral infarction without residual deficits: Secondary | ICD-10-CM | POA: Diagnosis not present

## 2023-09-26 DIAGNOSIS — Z7951 Long term (current) use of inhaled steroids: Secondary | ICD-10-CM | POA: Diagnosis not present

## 2023-09-26 DIAGNOSIS — Z7902 Long term (current) use of antithrombotics/antiplatelets: Secondary | ICD-10-CM | POA: Diagnosis not present

## 2023-09-26 DIAGNOSIS — Z951 Presence of aortocoronary bypass graft: Secondary | ICD-10-CM | POA: Diagnosis not present

## 2023-09-26 DIAGNOSIS — Z48812 Encounter for surgical aftercare following surgery on the circulatory system: Secondary | ICD-10-CM | POA: Diagnosis not present

## 2023-09-26 DIAGNOSIS — Z7952 Long term (current) use of systemic steroids: Secondary | ICD-10-CM | POA: Diagnosis not present

## 2023-09-26 DIAGNOSIS — J45909 Unspecified asthma, uncomplicated: Secondary | ICD-10-CM | POA: Diagnosis not present

## 2023-09-26 DIAGNOSIS — Z7984 Long term (current) use of oral hypoglycemic drugs: Secondary | ICD-10-CM | POA: Diagnosis not present

## 2023-09-26 DIAGNOSIS — I2510 Atherosclerotic heart disease of native coronary artery without angina pectoris: Secondary | ICD-10-CM | POA: Diagnosis not present

## 2023-09-26 DIAGNOSIS — N200 Calculus of kidney: Secondary | ICD-10-CM | POA: Diagnosis not present

## 2023-09-26 DIAGNOSIS — D649 Anemia, unspecified: Secondary | ICD-10-CM | POA: Diagnosis not present

## 2023-09-27 DIAGNOSIS — I4510 Unspecified right bundle-branch block: Secondary | ICD-10-CM | POA: Diagnosis not present

## 2023-09-27 DIAGNOSIS — I499 Cardiac arrhythmia, unspecified: Secondary | ICD-10-CM | POA: Diagnosis not present

## 2023-09-27 DIAGNOSIS — R000 Tachycardia, unspecified: Secondary | ICD-10-CM | POA: Diagnosis not present

## 2023-09-27 DIAGNOSIS — W19XXXA Unspecified fall, initial encounter: Secondary | ICD-10-CM | POA: Diagnosis not present

## 2023-09-27 DIAGNOSIS — Z23 Encounter for immunization: Secondary | ICD-10-CM | POA: Diagnosis not present

## 2023-09-27 DIAGNOSIS — S0101XA Laceration without foreign body of scalp, initial encounter: Secondary | ICD-10-CM | POA: Diagnosis not present

## 2023-09-30 DIAGNOSIS — Z7951 Long term (current) use of inhaled steroids: Secondary | ICD-10-CM | POA: Diagnosis not present

## 2023-09-30 DIAGNOSIS — F329 Major depressive disorder, single episode, unspecified: Secondary | ICD-10-CM | POA: Diagnosis not present

## 2023-09-30 DIAGNOSIS — Z7984 Long term (current) use of oral hypoglycemic drugs: Secondary | ICD-10-CM | POA: Diagnosis not present

## 2023-09-30 DIAGNOSIS — Z7902 Long term (current) use of antithrombotics/antiplatelets: Secondary | ICD-10-CM | POA: Diagnosis not present

## 2023-09-30 DIAGNOSIS — I2510 Atherosclerotic heart disease of native coronary artery without angina pectoris: Secondary | ICD-10-CM | POA: Diagnosis not present

## 2023-09-30 DIAGNOSIS — N200 Calculus of kidney: Secondary | ICD-10-CM | POA: Diagnosis not present

## 2023-09-30 DIAGNOSIS — Z7952 Long term (current) use of systemic steroids: Secondary | ICD-10-CM | POA: Diagnosis not present

## 2023-09-30 DIAGNOSIS — Z8673 Personal history of transient ischemic attack (TIA), and cerebral infarction without residual deficits: Secondary | ICD-10-CM | POA: Diagnosis not present

## 2023-09-30 DIAGNOSIS — I351 Nonrheumatic aortic (valve) insufficiency: Secondary | ICD-10-CM | POA: Diagnosis not present

## 2023-09-30 DIAGNOSIS — Z951 Presence of aortocoronary bypass graft: Secondary | ICD-10-CM | POA: Diagnosis not present

## 2023-09-30 DIAGNOSIS — D649 Anemia, unspecified: Secondary | ICD-10-CM | POA: Diagnosis not present

## 2023-09-30 DIAGNOSIS — Z48812 Encounter for surgical aftercare following surgery on the circulatory system: Secondary | ICD-10-CM | POA: Diagnosis not present

## 2023-09-30 DIAGNOSIS — Z85828 Personal history of other malignant neoplasm of skin: Secondary | ICD-10-CM | POA: Diagnosis not present

## 2023-09-30 DIAGNOSIS — I10 Essential (primary) hypertension: Secondary | ICD-10-CM | POA: Diagnosis not present

## 2023-09-30 DIAGNOSIS — J45909 Unspecified asthma, uncomplicated: Secondary | ICD-10-CM | POA: Diagnosis not present

## 2023-09-30 DIAGNOSIS — Z950 Presence of cardiac pacemaker: Secondary | ICD-10-CM | POA: Diagnosis not present

## 2023-10-01 DIAGNOSIS — Z0000 Encounter for general adult medical examination without abnormal findings: Secondary | ICD-10-CM | POA: Diagnosis not present

## 2023-10-01 DIAGNOSIS — Z9181 History of falling: Secondary | ICD-10-CM | POA: Diagnosis not present

## 2023-10-03 DIAGNOSIS — Z6820 Body mass index (BMI) 20.0-20.9, adult: Secondary | ICD-10-CM | POA: Diagnosis not present

## 2023-10-03 DIAGNOSIS — D649 Anemia, unspecified: Secondary | ICD-10-CM | POA: Diagnosis not present

## 2023-10-03 DIAGNOSIS — F329 Major depressive disorder, single episode, unspecified: Secondary | ICD-10-CM | POA: Diagnosis not present

## 2023-10-03 DIAGNOSIS — Z951 Presence of aortocoronary bypass graft: Secondary | ICD-10-CM | POA: Diagnosis not present

## 2023-10-03 DIAGNOSIS — Z85828 Personal history of other malignant neoplasm of skin: Secondary | ICD-10-CM | POA: Diagnosis not present

## 2023-10-03 DIAGNOSIS — E034 Atrophy of thyroid (acquired): Secondary | ICD-10-CM | POA: Diagnosis not present

## 2023-10-03 DIAGNOSIS — I351 Nonrheumatic aortic (valve) insufficiency: Secondary | ICD-10-CM | POA: Diagnosis not present

## 2023-10-03 DIAGNOSIS — Z7952 Long term (current) use of systemic steroids: Secondary | ICD-10-CM | POA: Diagnosis not present

## 2023-10-03 DIAGNOSIS — N200 Calculus of kidney: Secondary | ICD-10-CM | POA: Diagnosis not present

## 2023-10-03 DIAGNOSIS — Z7984 Long term (current) use of oral hypoglycemic drugs: Secondary | ICD-10-CM | POA: Diagnosis not present

## 2023-10-03 DIAGNOSIS — I119 Hypertensive heart disease without heart failure: Secondary | ICD-10-CM | POA: Diagnosis not present

## 2023-10-03 DIAGNOSIS — Z7951 Long term (current) use of inhaled steroids: Secondary | ICD-10-CM | POA: Diagnosis not present

## 2023-10-03 DIAGNOSIS — Z48812 Encounter for surgical aftercare following surgery on the circulatory system: Secondary | ICD-10-CM | POA: Diagnosis not present

## 2023-10-03 DIAGNOSIS — I209 Angina pectoris, unspecified: Secondary | ICD-10-CM | POA: Diagnosis not present

## 2023-10-03 DIAGNOSIS — Z7902 Long term (current) use of antithrombotics/antiplatelets: Secondary | ICD-10-CM | POA: Diagnosis not present

## 2023-10-03 DIAGNOSIS — Z950 Presence of cardiac pacemaker: Secondary | ICD-10-CM | POA: Diagnosis not present

## 2023-10-03 DIAGNOSIS — E782 Mixed hyperlipidemia: Secondary | ICD-10-CM | POA: Diagnosis not present

## 2023-10-03 DIAGNOSIS — J45909 Unspecified asthma, uncomplicated: Secondary | ICD-10-CM | POA: Diagnosis not present

## 2023-10-03 DIAGNOSIS — Z8673 Personal history of transient ischemic attack (TIA), and cerebral infarction without residual deficits: Secondary | ICD-10-CM | POA: Diagnosis not present

## 2023-10-03 DIAGNOSIS — I10 Essential (primary) hypertension: Secondary | ICD-10-CM | POA: Diagnosis not present

## 2023-10-03 DIAGNOSIS — E1169 Type 2 diabetes mellitus with other specified complication: Secondary | ICD-10-CM | POA: Diagnosis not present

## 2023-10-03 DIAGNOSIS — E1139 Type 2 diabetes mellitus with other diabetic ophthalmic complication: Secondary | ICD-10-CM | POA: Diagnosis not present

## 2023-10-03 DIAGNOSIS — I2510 Atherosclerotic heart disease of native coronary artery without angina pectoris: Secondary | ICD-10-CM | POA: Diagnosis not present

## 2023-10-03 NOTE — Progress Notes (Signed)
 Cardiology Office Note:    Date:  10/04/2023   ID:  Bruce Nunez, DOB 1940-11-08, MRN [id number]  PCP:  [name], PA  Cardiologist:  Redell Leiter, MD    Referring MD: [name], [name]    ASSESSMENT:    1. Orthostatic hypotension   2. S/P AVR (aortic valve replacement)   3. Coronary artery disease involving native coronary artery of native heart without angina pectoris   4. S/P CABG x 1   5. Heart block AV complete (HCC)   6. Presence of permanent cardiac pacemaker   7. Paroxysmal atrial fibrillation (HCC)   8. Chronic anticoagulation   9. Essential hypertension   10. Mixed hyperlipidemia    PLAN:    In order of problems listed above:  Improved but I think the taking midodrine  twice a day left and vulnerable in the middle the day and he had another orthostatic induced fall with injury Increase his midodrine  to 3 times a day 9 3 on 9 as wife will check blood pressure sitting and standing between morning afternoon and afternoon and evening doses and give me a list in 2 weeks Today in the office he has no orthostatic drop immediately and at 1 minute He is finally improving after his complex hospitalization with aortic valve replacement CAD bradycardia heart block and permanent pacemaker all of which are stable He will continue his anticoagulant he is close to 60 kg and weight he is over 80 and I will ask his neurologist if he thinks we can reduce the dose to 2 and half milligrams twice daily or switch him to edoxaban which has a reduced dose in individuals over the age of 55 which is safe and effective Stable blood pressure is not on midodrine  to support Continue his lipid-lowering treatment labs were just done with his PCP   Next appointment: 3 months   Medication Adjustments/Labs and Tests Ordered: Current medicines are reviewed at length with the patient today.  Concerns regarding medicines are outlined above.  No orders of the defined types were placed in this  encounter.  No orders of the defined types were placed in this encounter.    History of Present Illness:    Bruce Nunez is a 83 y.o. male with a hx of surgical AVR and CABG complicated by perioperative stroke heart block and pacemaker last seen by me 09/09/2023.  Other problems include permanent pacemaker paroxysmal atrial fibrillation and symptomatic hypotension related to cardiac medications.  At his last visit he was slowly improving but was continuing with orthostatic hypotension related to his alpha-blocker and was given midodrine  to support blood pressure and improve multi of life and enhance rehabilitation.  Compliance with diet, lifestyle and medications: Yes  [name] had another fairly abrupt episode where he felt weak and fell was in the emergency room no intracranial hemorrhage but required extensive skin scalp stapling. He is much better with midodrine  twice a day but this episode happened between doses His wife wonders if he has a seizure because he gets a strange sensation in his head then becomes very weak and afterwards he is not right for a long period time but does not lose consciousness he has no tongue biting and no fecal incontinence. I think what happened is the short duration of midodrine  being given at 12-hour intervals 9 and 9 he had symptomatic hypotension at midday. Will add a third dose in between at 3 PM and his wife will check blood pressure sitting and standing  between morning and late day doses record and bring me a list in 2 weeks He has an upcoming appointment with neurology and she can asked the question about seizure disorder He has been able to progress in rehab He is much better and is not having edema shortness of breath chest pain palpitation or syncope.  Past Medical History:  Diagnosis Date   Anemia    recent iron supplement   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   Aortic regurgitation 03/12/2017   Aortic stenosis 04/29/2019   Asthma    childhood-    2013 bronchitis w/wheezing   Complication of anesthesia     ether made me sick - woke up during colonoscopy   Diabetes mellitus without complication (HCC)    Endotracheally intubated 07/05/2023   Essential hypertension 03/12/2017   Frequency of urination    GERD (gastroesophageal reflux disease)    occasional   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   Heart murmur    Hemorrhoids    History of kidney stones    Hx of colon cancer, stage I    Hx of nonmelanoma skin cancer    Hx of peptic ulcer    as teenager   Hx of transfusion    Hyperlipidemia    Hypertension    Inguinal hernia    PAF (paroxysmal atrial fibrillation) (HCC) 08/20/2023   PONV (postoperative nausea and vomiting)    only when had Ether as Anesthesia drug   Prostate cancer (HCC) 08/15/2012   also Hx colon cancer / Hx skin cancer   Right inguinal hernia 05/08/2016   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   S/P right inguinal hernia repair Sept 2015 05/17/2014   Recurrent right indirect  Hernia treated with robotic TAPP repair with enterolysis Sept 2017   Stroke [facility]) 03/28/2022   Syncope 08/17/2023    Current Medications: Current Meds  Medication Sig   acetaminophen  (TYLENOL ) 325 MG tablet Take 1-2 tablets (325-650 mg total) by mouth every 6 (six) hours as needed for mild pain (pain score 1-3).   albuterol  (PROVENTIL  HFA;VENTOLIN  HFA) 108 (90 BASE) MCG/ACT inhaler Inhale 2 puffs into the lungs every 6 (six) hours as needed for shortness of breath.   apixaban  (ELIQUIS ) 5 MG TABS tablet Take 1 tablet (5 mg total) by mouth 2 (two) times daily.   budesonide -formoterol  (SYMBICORT) 160-4.5 MCG/ACT inhaler Inhale 2 puffs into the lungs 2 (two) times daily.   cyanocobalamin  (,VITAMIN B-12,) 1000 MCG/ML injection Inject 1 mL into the skin every 30 (thirty) days.   empagliflozin  (JARDIANCE ) 10 MG TABS tablet Take 1 tablet (10 mg total) by mouth daily.   fexofenadine (ALLEGRA) 180 MG  tablet Take 180 mg by mouth daily.   fluticasone  (FLONASE ) 50 MCG/ACT nasal spray Place 2 sprays into both nostrils 2 (two) times daily.   levothyroxine  (SYNTHROID ) 75 MCG tablet Take 75 mcg by mouth daily before breakfast.   metoprolol  succinate (TOPROL -XL) 50 MG 24 hr tablet Take 1 tablet (50 mg total) by mouth daily. Take with or immediately following a meal.   midodrine  (PROAMATINE ) 5 MG tablet Take 1 tablet (5 mg total) by mouth 2 (two) times daily. In the morning and early afternoon   omeprazole  (PRILOSEC ) 20 MG capsule Take 20 mg by mouth in the morning.   potassium chloride  SA (KLOR-CON  M) 20 MEQ tablet Take 1 tablet (20 mEq total) by mouth as needed. Take one tablet once a day for three days, then  take one tablet daily as needed when you take your Lasix .   rosuvastatin  (CRESTOR ) 20 MG tablet Take 1 tablet (20 mg total) by mouth daily.   solifenacin (VESICARE) 10 MG tablet Take 10 mg by mouth daily.   tamsulosin  (FLOMAX ) 0.4 MG CAPS capsule Take 1 capsule (0.4 mg total) by mouth daily.      EKGs/Labs/Other Studies Reviewed:    The following studies were reviewed today:  Cardiac Studies & Procedures   CARDIAC CATHETERIZATION  CARDIAC CATHETERIZATION 05/03/2023  Narrative   Prox RCA to Mid RCA lesion is 50% stenosed.   Mid LM to Dist LM lesion is 30% stenosed.   Prox Cx to Dist Cx lesion is 50% stenosed.   Prox LAD to Mid LAD lesion is 50% stenosed.   1st Diag lesion is 60% stenosed.   There is severe (4+) aortic regurgitation.  1.  Moderate multivessel coronary artery disease with mild distal left main plaquing of 30%, moderate diffuse LAD stenosis of 50%, mild to moderate mid circumflex stenosis of 40 to 50%, and moderate mid RCA stenosis of 50%. 2.  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  Severe right subclavian tortuosity makes cardiac catheterization very difficult from right radial access.  If patient requires future cardiac  catheterization, avoid right radial access.  Recommendations: Heart team review, cardiac CTA (TAVR protocol) to evaluate if there is enough aortic valve calcification to perform TAVR as aortic insufficiency appears to be the primary valve lesion.  The patient has a very wide pulse pressure and angiographically has severe AI.  Findings Coronary Findings Diagnostic  Dominance: Right  Left Main Mid LM to Dist LM lesion is 30% stenosed.  Left Anterior Descending The LAD has moderate diffuse 50% stenosis throughout the proximal and mid vessel.  The first diagonal has a 50 to 60% ostial stenosis. Prox LAD to Mid LAD lesion is 50% stenosed.  First Diagonal Branch 1st Diag lesion is 60% stenosed.  Left Circumflex There is mild diffuse disease throughout the vessel. Prox Cx to Dist Cx lesion is 50% stenosed. The lesion is moderately calcified.  Right Coronary Artery There is mild diffuse disease throughout the vessel. Dominant vessel with no high-grade CAD.  There is moderate diffuse calcification present.  The mid RCA has a 50% stenosis.  The PDA and PLA branches are patent with no significant stenoses.  The distal vessel has mild diffuse plaquing with no stenosis. Prox RCA to Mid RCA lesion is 50% stenosed. The lesion is moderately calcified.  Intervention  No interventions have been documented.    ECHOCARDIOGRAM  ECHOCARDIOGRAM LIMITED 08/18/2023  Narrative ECHOCARDIOGRAM LIMITED REPORT    Patient Name:   Bruce Nunez Date of Exam: 08/18/2023 Medical Rec #:  [id number]     Height:       67.0 in Accession #:    [id number]    Weight:       139.0 lb Date of Birth:  March 27, 1941      BSA:          1.733 m Patient Age:    82 years      BP:           138/62 mmHg Patient Gender: M             HR:           99 bpm. Exam Location:  Inpatient  Procedure: Limited Echo, Cardiac Doppler and Limited Color Doppler  Indications:     Aortic regurgitation  I35.1  History:         Patient has  prior history of Echocardiogram examinations, most recent 07/16/2023. Risk Factors:Hypertension, Diabetes and Dyslipidemia. 07/06/23 CABG and AVR with 21mm Inspiris Resilia Bioprosthetic. Aortic Valve: 21 mm Inspiris Resilia bioprosthetic valve is present in the aortic position.  Aortic Valve: 21 mm Inspiris valve is present in the aortic position.  Sonographer:     Aida Pizza RCS Referring Phys:  [id number] [facility] A SANTO Diagnosing Phys: Lonni Nanas MD  IMPRESSIONS   1. Left ventricular ejection fraction, by estimation, is 70 to 75%. The left ventricle has hyperdynamic function. The left ventricle has no regional wall motion abnormalities. There is mild left ventricular hypertrophy. 2. Right ventricular systolic function is normal. The right ventricular size is normal. Tricuspid regurgitation signal is inadequate for assessing PA pressure. 3. A small pericardial effusion is present. 4. The mitral valve is normal in structure. Trivial mitral valve regurgitation. 5. The aortic valve has been repaired/replaced. Aortic valve regurgitation is not visualized. There is a 21 mm Inspiris valve present in the aortic position. Vmax 2.6 m/s, MG , EOA 1.1 cm^2, DI 0.5 6. The inferior vena cava is normal in size with greater than 50% respiratory variability, suggesting right atrial pressure of 3 mmHg.  FINDINGS Left Ventricle: Left ventricular ejection fraction, by estimation, is 70 to 75%. The left ventricle has hyperdynamic function. The left ventricle has no regional wall motion abnormalities. The left ventricular internal cavity size was small. There is mild left ventricular hypertrophy.  Right Ventricle: The right ventricular size is normal. Right ventricular systolic function is normal. Tricuspid regurgitation signal is inadequate for assessing PA pressure.  Pericardium: A small pericardial effusion is present.  Mitral Valve: The mitral valve is normal in structure.  Trivial mitral valve regurgitation.  Aortic Valve: The aortic valve has been repaired/replaced. Aortic valve regurgitation is not visualized. Aortic valve mean gradient measures 13.3 mmHg. Aortic valve peak gradient measures 25.7 mmHg. Aortic valve area, by VTI measures 1.12 cm. There is a 21 mm Inspiris valve present in the aortic position.  Venous: The inferior vena cava is normal in size with greater than 50% respiratory variability, suggesting right atrial pressure of 3 mmHg.  LEFT VENTRICLE PLAX 2D LVOT diam:     1.60 cm LV SV:         43 LV SV Index:   25 LVOT Area:     2.01 cm   AORTIC VALVE AV Area (Vmax):    0.94 cm AV Area (Vmean):   0.96 cm AV Area (VTI):     1.12 cm AV Vmax:           253.33 cm/s AV Vmean:          167.333 cm/s AV VTI:            0.389 m AV Peak Grad:      25.7 mmHg AV Mean Grad:      13.3 mmHg LVOT Vmax:         118.00 cm/s LVOT Vmean:        80.200 cm/s LVOT VTI:          0.216 m LVOT/AV VTI ratio: 0.56   SHUNTS Systemic VTI:  0.22 m Systemic Diam: 1.60 cm  Lonni Nanas MD Electronically signed by Lonni Nanas MD Signature Date/Time: 08/18/2023/4:47:05 PM    Final (Updated)  TEE  ECHO INTRAOPERATIVE TEE 07/05/2023  Narrative *INTRAOPERATIVE TRANSESOPHAGEAL REPORT *    Patient Name:   ALECSANDER  MARLA CLOVER  Date of Exam: 07/05/2023 Medical Rec #:  [id number]      Height:       67.0 in Accession #:    [id number]     Weight:       146.3 lb Date of Birth:  [birth year]       BSA:          1.77 m Patient Age:    82 years       BP:           161/54 mmHg Patient Gender: M              HR:           81 bpm. Exam Location:  Anesthesiology  Transesophogeal exam was perform intraoperatively during surgical procedure. Patient was closely monitored under general anesthesia during the entirety of examination.  Indications:     I35.1 Nonrheumatic aortic (valve) insufficiency Performing Phys: [id number] DEWARD KALLMAN Diagnosing Phys:  Cordella Stoltzfus  Complications: No known complications during this procedure. POST-OP IMPRESSIONS _ Left Ventricle: The left ventricle is unchanged from pre-bypass. _ Right Ventricle: The right ventricle appears unchanged from pre-bypass. _ Aorta: The aorta appears unchanged from pre-bypass. _ Left Atrial Appendage: The left atrial appendage appears unchanged from pre-bypass. _ Aortic Valve: A bioprosthetic valve was placed, leaflets are freely mobile Size; 21mm. The gradient recorded across the prosthetic valve is within the expected range. No perivalvular leak noted. Mean PG _ Mitral Valve: The mitral valve appears unchanged from pre-bypass. _ Tricuspid Valve: The tricuspid valve appears unchanged from pre-bypass. _ Pulmonic Valve: The pulmonic valve appears unchanged from pre-bypass. _ Interatrial Septum: The interatrial septum appears unchanged from pre-bypass. _ Pericardium: The pericardium appears unchanged from pre-bypass. _ Comments: S/P AVR CABG. No new or worsening wall motion or valvular abnormalities post procedure. 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 Seated appropriately without rock or leak. Gradients WNL.  PRE-OP FINDINGS Left Ventricle: The left ventricle has normal systolic function, with an ejection fraction of 60-65%. The cavity size was normal. Left ventricular diastolic parameters are consistent with Grade I diastolic dysfunction (impaired relaxation).   Right Ventricle: The right ventricle has normal systolic function. The cavity was normal. There is no increase in right ventricular wall thickness. Right ventricular systolic pressure is normal.  Left Atrium: Left atrial size was normal in size. No left atrial/left atrial appendage thrombus was detected. Left atrial appendage velocity is normal at greater than 40 cm/s.  Right Atrium: Right atrial size was normal in size.  Interatrial Septum: No atrial level shunt detected by color  flow Doppler. The interatrial septum appears to be lipomatous. There is no evidence of a patent foramen ovale.  Pericardium: There is no evidence of pericardial effusion. There is no pleural effusion.  Mitral Valve: The mitral valve is normal in structure. Mitral valve regurgitation is trivial by color flow Doppler. There is no evidence of mitral valve vegetation. There is No evidence of mitral stenosis.  Tricuspid Valve: The tricuspid valve was normal in structure. Tricuspid valve regurgitation is trivial by color flow Doppler. No evidence of tricuspid stenosis is present. There is no evidence of tricuspid valve vegetation.  Aortic Valve: The aortic valve is tricuspid. Aortic valve regurgitation is moderate to severe. The jet is eccentric anteriorly directed. There is mild stenosis of the aortic valve, with a calculated valve area of 1.40 cm. AI PHT , VC .7cm, diastolic reversal descending thoracic aorta, EROA PISA .32cm2.  Pulmonic Valve: The pulmonic valve was normal in structure, with normal. No evidence of pumonic stenosis. Pulmonic valve regurgitation is mild by color flow Doppler.   Aorta: The aortic root and ascending aorta are normal in size and structure.  Pulmonary Artery: Norva Purl catheter present on the right. The pulmonary artery is of normal size.  Shunts: There is no evidence of an atrial septal defect.  +--------------+--------++ LEFT VENTRICLE         +--------------+--------++ PLAX 2D                +--------------+--------++ LVIDd:        4.80 cm  +--------------+--------++ LVIDs:        3.00 cm  +--------------+--------++ LVOT diam:    2.00 cm  +--------------+--------++ LV SV:        73 ml    +--------------+--------++ LV SV Index:  40.86    +--------------+--------++ LVOT Area:    3.14 cm +--------------+--------++                        +--------------+--------++  +------------------+------------++ AORTIC  VALVE                   +------------------+------------++ AV Area (Vmax):   1.34 cm     +------------------+------------++ AV Area (Vmean):  1.36 cm     +------------------+------------++ AV Area (VTI):    1.40 cm     +------------------+------------++ AV Vmax:          279.00 cm/s  +------------------+------------++ AV Vmean:         193.000 cm/s +------------------+------------++ AV VTI:           0.601 m      +------------------+------------++ AV Peak Grad:     31.1 mmHg    +------------------+------------++ AV Mean Grad:     17.0 mmHg    +------------------+------------++ LVOT Vmax:        119.00 cm/s  +------------------+------------++ LVOT Vmean:       83.800 cm/s  +------------------+------------++ LVOT VTI:         0.267 m      +------------------+------------++ LVOT/AV VTI ratio:0.44         +------------------+------------++  +-------------+-------++ AORTA                +-------------+-------++ Ao Root diam:3.10 cm +-------------+-------++ Ao STJ diam: 2.4 cm  +-------------+-------++ Ao Asc diam: 3.20 cm +-------------+-------++  +--------------+----------++ MITRAL VALVE              +--------------+-------+ +--------------+----------++  SHUNTS                MV Area (PHT):3.63 cm    +--------------+-------+ +--------------+----------++  Systemic VTI: 0.27 m  MV Peak grad: 3.0 mmHg    +--------------+-------+ +--------------+----------++  Systemic Diam:2.00 cm MV Mean grad: 1.0 mmHg    +--------------+-------+ +--------------+----------++ MV Vmax:      0.86 m/s   +--------------+----------++ MV Vmean:     53.1 cm/s  +--------------+----------++ MV PHT:       60.61 msec +--------------+----------++ MV Decel Time:209 msec   +--------------+----------++ +--------------+----------++ MV E velocity:63.50  cm/s +--------------+----------++ MV A velocity:80.40 cm/s +--------------+----------++ MV E/A ratio: 0.79       +--------------+----------++   Cordella Fix Electronically signed by Cordella Fix Signature Date/Time: 07/06/2023/3:06:06 PM    Final  MONITORS  LONG TERM MONITOR (3-14 DAYS) 04/23/2022  Narrative Patch Wear Time:  13 days and 22 hours (2023-08-02T11:41:54-0400 to 2023-08-16T10:11:23-0400)  Patient had a min HR of 52  bpm, max HR of 122 bpm, and avg HR of 68 bpm. Predominant underlying rhythm was Sinus Rhythm.  There were no sinus pauses of 3 seconds or greater and no episodes of second or third-degree AV nodal block.  There were no triggered or diary events.  4 Supraventricular Tachycardia runs occurred, the run with the fastest interval lasting 4 beats with a max rate of 122 bpm, the longest lasting 6 beats with an avg rate of 94 bpm. Isolated SVEs were rare (<1.0%), SVE Couplets were rare (<1.0%), and SVE Triplets were rare (<1.0%).  Isolated VEs were rare (<1.0%), and no VE Couplets or VE Triplets were present. Ventricular Trigeminy was present.  CT SCANS  CT CORONARY MORPH W/CTA COR W/SCORE 04/23/2023  Addendum 05/09/2023 10:39 AM ADDENDUM REPORT: 05/09/2023 10:37  EXAM: OVER-READ INTERPRETATION  CT CHEST  The following report is an over-read performed by radiologist Dr. Fonda Mom [facility] on 05/09/2023. This over-read does not include interpretation of cardiac or coronary anatomy or pathology. The coronary CTA interpretation by the cardiologist is attached.  COMPARISON:  None.  FINDINGS: Cardiovascular:  See findings discussed in the body of the report.  Mediastinum/Nodes: No suspicious adenopathy identified. Imaged mediastinal structures are unremarkable.  Lungs/Pleura: Imaged lungs are clear. No pleural effusion or pneumothorax.  Upper Abdomen: No acute abnormality.  Musculoskeletal: No chest wall  abnormality. There are thoracic degenerative changes.  IMPRESSION: No significant extracardiac incidental findings identified.   Electronically Signed By: Fonda Field M.D. On: 05/09/2023 10:37  Narrative CLINICAL DATA:  Chest pain  EXAM: Cardiac/Coronary CTA  TECHNIQUE: A non-contrast, gated CT scan was obtained with axial slices of 3 mm through the heart for calcium  scoring. Calcium  scoring was performed using the Agatston method. A 120 kV prospective, gated, contrast cardiac scan was obtained. Gantry rotation speed was 250 msecs and collimation was 0.6 mm. Two sublingual nitroglycerin  tablets (0.8 mg) were given. The 3D data set was reconstructed in 5% intervals of the 35-75% of the R-R cycle. Diastolic phases were analyzed on a dedicated workstation using MPR, MIP, and VRT modes. The patient received 95 cc of contrast.  FINDINGS: Image quality: Fair. Significant blooming artifact, respiratory motion.  Noise artifact is: Mild  Coronary Arteries:  Normal coronary origin.  Right dominance.  Left main: The left main is a large caliber vessel with a normal take off from the left coronary cusp that bifurcates to form a left anterior descending artery and a left circumflex artery. There is mild calcified plaque in the distal LM with associated stenosis of 25-49%.  Left anterior descending artery: The LAD gives off 2 patent diagonal branches. There is diffuse mild to moderate calcified plaque in the proximal and mid LAD with associated stenosis of 25-49% but at some places could be > 50%. This may be overestimated in the setting of blooming artifact.  Left circumflex artery: The LCX is non-dominant and gives off 1 patent obtuse marginal branch. There is diffuse calcified plaque in the ostial, proximal and mid LCx with associated stenosis of 25-49% with a possible moderate to severe focal stenosis of at least 50-69% and possibly >70% in the mid LCx.  Right  coronary artery: The RCA is dominant with normal take off from the right coronary cusp. The RCA terminates as a PDA and right posterolateral branch without evidence of plaque or stenosis. There is mild scattered calcified plaque in the throughout RCA with associated stenosis of 25-49%. There is moderate calcified plaque in the mid RCA  with associated stenosis of 50-69%.  Right Atrium: Right atrial size is within normal limits.  Right Ventricle: The right ventricular cavity is within normal limits.  Left Atrium: Left atrial size is normal in size with no left atrial appendage filling defect.  Left Ventricle: The ventricular cavity size is within normal limits.  Pulmonary arteries: Normal in size.  Pulmonary veins: Normal pulmonary venous drainage.  Pericardium: Normal thickness without significant effusion or calcium  present.  Cardiac valves: The aortic valve is trileaflet without significant calcification. The mitral valve is normal without significant calcification.  Aorta: Normal caliber with scattered calcifications.  Extra-cardiac findings: See attached radiology report for non-cardiac structures.  IMPRESSION: 1. Coronary calcium  score of 812. This was 60th percentile for age-, sex, and race-matched controls.  2. Total plaque volume 663 mm3 which is 40th percentile for age- and sex-matched controls (calcified plaque 88 mm3; non-calcified plaque 575 mm3). TPV is severe.  3. Normal coronary origin with right dominance.  4. Mild to moderate atherosclerosis. 25-49% distal LM/proximal to mid LAD. 25-49% ostial/proximal/mid LCx and diffusely throughout the RCA. Possible calcified plaque >50% in the mid RCA/LCx and LAD.  5. Recommend preventive therapy and risk factor modification.  6. This study has been submitted for FFR analysis.  RECOMMENDATIONS: 1. CAD-RADS 0: No evidence of CAD (0%). Consider non-atherosclerotic causes of chest pain.  2. CAD-RADS 1: Minimal  non-obstructive CAD (0-24%). Consider non-atherosclerotic causes of chest pain. Consider preventive therapy and risk factor modification.  3. CAD-RADS 2: Mild non-obstructive CAD (25-49%). Consider non-atherosclerotic causes of chest pain. Consider preventive therapy and risk factor modification.  4. CAD-RADS 3: Moderate stenosis. Consider symptom-guided anti-ischemic pharmacotherapy as well as risk factor modification per guideline directed care. Additional analysis with CT FFR will be submitted.  5. CAD-RADS 4: Severe stenosis. (70-99% or > 50% left main). Cardiac catheterization or CT FFR is recommended. Consider symptom-guided anti-ischemic pharmacotherapy as well as risk factor modification per guideline directed care. Invasive coronary angiography recommended with revascularization per published guideline statements.  6. CAD-RADS 5: Total coronary occlusion (100%). Consider cardiac catheterization or viability assessment. Consider symptom-guided anti-ischemic pharmacotherapy as well as risk factor modification per guideline directed care.  7. CAD-RADS N: Non-diagnostic study. Obstructive CAD can't be excluded. Alternative evaluation is recommended.  Wilbert Bihari, MD  Electronically Signed: By: Wilbert Bihari M.D. On: 04/30/2023 19:47  CARDIAC MRI  MR CARDIAC MORPHOLOGY W WO CONTRAST 03/21/2022  Narrative CLINICAL DATA:  Evaluate aortic regurgitation  EXAM: CARDIAC MRI  TECHNIQUE: The patient was scanned on a 1.5 Tesla Siemens magnet. A dedicated cardiac coil was used. Functional imaging was done using Fiesta sequences. 2,3, and 4 chamber views were done to assess for RWMA's. Modified Simpson's rule using a short axis stack was used to calculate an ejection fraction on a dedicated work [employer]. The patient received 10 cc of Gadavist . After 10 minutes inversion recovery sequences were used to assess for infiltration and scar tissue. Phase  contrast velocity mapping performed.  CONTRAST:  10 cc  of Gadavist   FINDINGS: 1. Normal left ventricular size, thickness and systolic function (LVEF = 54%). There are no regional wall motion abnormalities.  There is no late gadolinium enhancement in the left ventricular myocardium.  LVEDV: 123 ml  LVESV: 57 ml  SV: 66 ml  Myocardial mass: 86 g  2. Normal right ventricular size, thickness and systolic function (RVEF = 63%). There are no regional wall motion abnormalities.  3.  Normal left and right atrial size.  4. Normal size of the aortic root, ascending aorta and pulmonary artery.  5. Moderate aortic regurgitation. Aortic valve measurements: regurgitant fraction = 37%, regurgitant volume = 24 ml  Mild tricuspid regurgitation.  6.  Normal pericardium.  No pericardial effusion.  IMPRESSION: 1.  Normal Biventricular size and function.  LVEF 54%, RVEF 63%.  2.  Moderate aortic valve regurgitation.  Regurgitant fraction 37%.  3.  No evidence of late gadolinium enhancement or infiltration.  4.  Mild tricuspid regurgitation.  Redell Cave   Electronically Signed By: Redell Cave M.D. On: 03/26/2022 17:40             Recent Labs: 07/29/2023: ALT 15; NT-Pro BNP 917 08/18/2023: Magnesium  1.9; TSH 0.453 08/20/2023: BUN 17; Creatinine, Ser 1.09; Hemoglobin 11.2; Platelets 269; Potassium 3.9; Sodium 134  Recent Lipid Panel    Component Value Date/Time   CHOL 74 07/11/2023 0437   CHOL 95 (L) 03/21/2023 1059   TRIG 149 07/11/2023 0437   HDL 11 (L) 07/11/2023 0437   HDL 32 (L) 03/21/2023 1059   CHOLHDL 6.7 07/11/2023 0437   VLDL 30 07/11/2023 0437   LDLCALC 33 07/11/2023 0437   LDLCALC 39 03/21/2023 1059    Physical Exam:    VS:  BP 128/72   Pulse 62   Ht 5' 6 (1.676 m)   Wt 134 lb 6.4 oz (61 kg)   SpO2 98%   BMI 21.69 kg/m     Wt Readings from Last 3 Encounters:  10/04/23 134 lb 6.4 oz (61 kg)  09/09/23 133 lb 12.8 oz (60.7 kg)   09/02/23 134 lb (60.8 kg)     GEN:  Well nourished, well developed in no acute distress HEENT: Normal NECK: No JVD; No carotid bruits LYMPHATICS: No lymphadenopathy CARDIAC: Last time people at RRR, no murmurs, rubs, gallops RESPIRATORY:  Clear to auscultation without rales, wheezing or rhonchi  ABDOMEN: Soft, non-tender, non-distended MUSCULOSKELETAL:  No edema; No deformity  SKIN: Warm and dry NEUROLOGIC:  Alert and oriented x 3 PSYCHIATRIC:  Normal affect    Signed, Redell Leiter, MD  10/04/2023 10:19 AM    [facility]

## 2023-10-04 ENCOUNTER — Encounter: Payer: Self-pay | Admitting: Cardiology

## 2023-10-04 ENCOUNTER — Ambulatory Visit: Payer: Medicare Other | Attending: Cardiology | Admitting: Cardiology

## 2023-10-04 VITALS — BP 132/60 | HR 62 | Ht 66.0 in | Wt 134.4 lb

## 2023-10-04 DIAGNOSIS — I951 Orthostatic hypotension: Secondary | ICD-10-CM

## 2023-10-04 DIAGNOSIS — Z952 Presence of prosthetic heart valve: Secondary | ICD-10-CM

## 2023-10-04 DIAGNOSIS — I2510 Atherosclerotic heart disease of native coronary artery without angina pectoris: Secondary | ICD-10-CM | POA: Diagnosis not present

## 2023-10-04 DIAGNOSIS — I10 Essential (primary) hypertension: Secondary | ICD-10-CM

## 2023-10-04 DIAGNOSIS — Z951 Presence of aortocoronary bypass graft: Secondary | ICD-10-CM

## 2023-10-04 DIAGNOSIS — Z950 Presence of cardiac pacemaker: Secondary | ICD-10-CM

## 2023-10-04 DIAGNOSIS — Z7901 Long term (current) use of anticoagulants: Secondary | ICD-10-CM

## 2023-10-04 DIAGNOSIS — I442 Atrioventricular block, complete: Secondary | ICD-10-CM

## 2023-10-04 DIAGNOSIS — E782 Mixed hyperlipidemia: Secondary | ICD-10-CM

## 2023-10-04 DIAGNOSIS — I480 Paroxysmal atrial fibrillation: Secondary | ICD-10-CM

## 2023-10-04 MED ORDER — MIDODRINE HCL 5 MG PO TABS: 5.0000 mg | ORAL_TABLET | Freq: Three times a day (TID) | ORAL | 3 refills | Status: DC

## 2023-10-04 NOTE — Patient Instructions (Signed)
 Medication Instructions:  Your physician has recommended you make the following change in your medication:   START: Midodrine  5 mg three times daily (Take the third dose at 3 pm)  *If you need a refill on your cardiac medications before your next appointment, please call your pharmacy*   Lab Work: None If you have labs (blood work) drawn today and your tests are completely normal, you will receive your results only by: MyChart Message (if you have MyChart) OR A paper copy in the mail If you have any lab test that is abnormal or we need to change your treatment, we will call you to review the results.   Testing/Procedures: None   Follow-Up: At [facility], you and your health needs are our priority.  As part of our continuing mission to provide you with exceptional heart care, we have created designated Provider Care Teams.  These Care Teams include your primary Cardiologist (physician) and Advanced Practice Providers (APPs -  Physician Assistants and Nurse Practitioners) who all work together to provide you with the care you need, when you need it.  We recommend signing up for the patient portal called MyChart.  Sign up information is provided on this After Visit Summary.  MyChart is used to connect with patients for Virtual Visits (Telemedicine).  Patients are able to view lab/test results, encounter notes, upcoming appointments, etc.  Non-urgent messages can be sent to your provider as well.   To learn more about what you can do with MyChart, go to [email or web address].    Your next appointment:   3 month(s)  Provider:   Redell Leiter, MD    Other Instructions Check blood pressure twice daily at mid - morning and at evening both sitting and standing for 2 weeks. After 2 weeks drop off a list of blood pressures to the office.

## 2023-10-05 LAB — LAB REPORT - SCANNED
A1c: 6.4
TSH: 1.08 (ref 0.41–5.90)

## 2023-10-08 ENCOUNTER — Telehealth: Payer: Self-pay

## 2023-10-08 NOTE — Telephone Encounter (Signed)
Received a fax from [facility] stating that they needed clearance for the patient to be able to start cardiac rehab. Patient has had multiple falls recently. Spoke to Dr. Dulce Sellar regarding the needed clearance to start cardiac rehab and no new orders received.

## 2023-10-16 ENCOUNTER — Ambulatory Visit: Payer: Medicare Other | Admitting: Neurology

## 2023-10-18 ENCOUNTER — Ambulatory Visit (INDEPENDENT_AMBULATORY_CARE_PROVIDER_SITE_OTHER): Payer: Medicare Other

## 2023-10-18 DIAGNOSIS — I639 Cerebral infarction, unspecified: Secondary | ICD-10-CM

## 2023-10-18 LAB — CUP PACEART REMOTE DEVICE CHECK
Battery Remaining Longevity: 127 mo
Battery Remaining Percentage: 95.5 %
Battery Voltage: 3.02 V
Brady Statistic AP VP Percent: 1 %
Brady Statistic AP VS Percent: 1 %
Brady Statistic AS VP Percent: 1 %
Brady Statistic AS VS Percent: 99 %
Brady Statistic RA Percent Paced: 1 %
Brady Statistic RV Percent Paced: 1 %
Date Time Interrogation Session: 20250221020032
Implantable Lead Connection Status: 753985
Implantable Lead Connection Status: 753985
Implantable Lead Implant Date: 20241120
Implantable Lead Implant Date: 20241120
Implantable Lead Location: 753859
Implantable Lead Location: 753860
Implantable Pulse Generator Implant Date: 20241120
Lead Channel Impedance Value: 410 Ohm
Lead Channel Impedance Value: 450 Ohm
Lead Channel Pacing Threshold Amplitude: 0.625 V
Lead Channel Pacing Threshold Amplitude: 1.125 V
Lead Channel Pacing Threshold Pulse Width: 0.5 ms
Lead Channel Pacing Threshold Pulse Width: 0.5 ms
Lead Channel Sensing Intrinsic Amplitude: 3.4 mV
Lead Channel Sensing Intrinsic Amplitude: 5 mV
Lead Channel Setting Pacing Amplitude: 0.875
Lead Channel Setting Pacing Amplitude: 2.125
Lead Channel Setting Pacing Pulse Width: 0.5 ms
Lead Channel Setting Sensing Sensitivity: 0.5 mV
Pulse Gen Model: 2272
Pulse Gen Serial Number: 8213495

## 2023-10-21 ENCOUNTER — Other Ambulatory Visit (HOSPITAL_COMMUNITY): Payer: Self-pay

## 2023-10-21 ENCOUNTER — Other Ambulatory Visit: Payer: Self-pay

## 2023-10-23 ENCOUNTER — Other Ambulatory Visit: Payer: Self-pay

## 2023-10-24 DIAGNOSIS — N401 Enlarged prostate with lower urinary tract symptoms: Secondary | ICD-10-CM | POA: Diagnosis not present

## 2023-10-24 DIAGNOSIS — N3281 Overactive bladder: Secondary | ICD-10-CM | POA: Diagnosis not present

## 2023-10-24 DIAGNOSIS — Z8546 Personal history of malignant neoplasm of prostate: Secondary | ICD-10-CM | POA: Diagnosis not present

## 2023-11-04 ENCOUNTER — Ambulatory Visit: Payer: Medicare Other | Admitting: Cardiology

## 2023-11-07 ENCOUNTER — Emergency Department (HOSPITAL_COMMUNITY)

## 2023-11-07 ENCOUNTER — Encounter (HOSPITAL_COMMUNITY): Payer: Self-pay

## 2023-11-07 ENCOUNTER — Emergency Department (HOSPITAL_COMMUNITY): Admission: EM | Admit: 2023-11-07 | Discharge: 2023-11-08 | Disposition: A | Discharge status: Home / Self Care

## 2023-11-07 DIAGNOSIS — I10 Essential (primary) hypertension: Secondary | ICD-10-CM | POA: Insufficient documentation

## 2023-11-07 DIAGNOSIS — Z7901 Long term (current) use of anticoagulants: Secondary | ICD-10-CM | POA: Insufficient documentation

## 2023-11-07 DIAGNOSIS — I2510 Atherosclerotic heart disease of native coronary artery without angina pectoris: Secondary | ICD-10-CM | POA: Insufficient documentation

## 2023-11-07 DIAGNOSIS — Z8673 Personal history of transient ischemic attack (TIA), and cerebral infarction without residual deficits: Secondary | ICD-10-CM | POA: Diagnosis not present

## 2023-11-07 DIAGNOSIS — R4182 Altered mental status, unspecified: Secondary | ICD-10-CM | POA: Insufficient documentation

## 2023-11-07 DIAGNOSIS — Z5321 Procedure and treatment not carried out due to patient leaving prior to being seen by health care provider: Secondary | ICD-10-CM | POA: Diagnosis not present

## 2023-11-07 DIAGNOSIS — R410 Disorientation, unspecified: Secondary | ICD-10-CM

## 2023-11-07 DIAGNOSIS — G9389 Other specified disorders of brain: Secondary | ICD-10-CM | POA: Diagnosis not present

## 2023-11-07 DIAGNOSIS — I6523 Occlusion and stenosis of bilateral carotid arteries: Secondary | ICD-10-CM | POA: Diagnosis not present

## 2023-11-07 LAB — COMPREHENSIVE METABOLIC PANEL
ALT: 13 U/L (ref 0–44)
AST: 19 U/L (ref 15–41)
Albumin: 3.8 g/dL (ref 3.5–5.0)
Alkaline Phosphatase: 60 U/L (ref 38–126)
Anion gap: 8 (ref 5–15)
BUN: 23 mg/dL (ref 8–23)
CO2: 23 mmol/L (ref 22–32)
Calcium: 9.2 mg/dL (ref 8.9–10.3)
Chloride: 102 mmol/L (ref 98–111)
Creatinine, Ser: 0.95 mg/dL (ref 0.61–1.24)
GFR, Estimated: >60 mL/min (ref >60)
Glucose, Bld: 130 mg/dL — ABNORMAL HIGH (ref 70–99)
Potassium: 4.1 mmol/L (ref 3.5–5.1)
Sodium: 133 mmol/L — ABNORMAL LOW (ref 135–145)
Total Bilirubin: 1.1 mg/dL (ref 0.0–1.2)
Total Protein: 6.6 g/dL (ref 6.5–8.1)

## 2023-11-07 LAB — CBC
HCT: 35.2 % — ABNORMAL LOW (ref 39.0–52.0)
Hemoglobin: 10.1 g/dL — ABNORMAL LOW (ref 13.0–17.0)
MCH: 21.1 pg — ABNORMAL LOW (ref 26.0–34.0)
MCHC: 28.7 g/dL — ABNORMAL LOW (ref 30.0–36.0)
MCV: 73.6 fL — ABNORMAL LOW (ref 80.0–100.0)
Platelets: 216 10*3/uL (ref 150–400)
RBC: 4.78 MIL/uL (ref 4.22–5.81)
RDW: 16.1 % — ABNORMAL HIGH (ref 11.5–15.5)
WBC: 12.6 10*3/uL — ABNORMAL HIGH (ref 4.0–10.5)
nRBC: 0 % (ref 0.0–0.2)

## 2023-11-07 LAB — CBG MONITORING, ED: Glucose-Capillary: 131 mg/dL — ABNORMAL HIGH (ref 70–99)

## 2023-11-07 NOTE — ED Triage Notes (Signed)
 Pt was having having periods of AMS this afternoon with hallucinations, seeing people on the roof, pt is AxO now, hx of UTI, frequency in urination since last night, denies dysuria.

## 2023-11-07 NOTE — ED Notes (Signed)
Urinal at the bedside 

## 2023-11-07 NOTE — ED Notes (Signed)
 Patient transported to CT

## 2023-11-08 LAB — URINALYSIS, ROUTINE W REFLEX MICROSCOPIC
Bacteria, UA: NONE SEEN
Bilirubin Urine: NEGATIVE
Glucose, UA: >=500 mg/dL — AB
Hgb urine dipstick: NEGATIVE
Ketones, ur: NEGATIVE mg/dL
Leukocytes,Ua: NEGATIVE
Nitrite: NEGATIVE
Protein, ur: NEGATIVE mg/dL
Specific Gravity, Urine: 1.02 (ref 1.005–1.030)
pH: 5 (ref 5.0–8.0)

## 2023-11-08 NOTE — Discharge Instructions (Signed)
 As discussed.  Please follow-up with your primary doctor and your neurologist.  Turn immediately 12 fevers, chills, chest pain, shortness of breath, sudden onset headache, vision changes, facial droop, unilateral weakness, abdominal pain, or any new or worsening symptoms that are concerning to you.

## 2023-11-08 NOTE — ED Provider Notes (Signed)
 [facility] EM[facility]Y DE[facility]MENT AT [facility] Provider Note   CSN: [id number] Arrival date & time: 11/07/23  2236     History  Chief Complaint  Patient presents with   Altered Mental Status    Bruce Nunez is a 83 y.o. male.  This is a 83 year old male presenting emergency department for transient altered mental status.  Reportedly has had similar episodes where he becomes transiently confused.  Today however, he was hallucinating and seeing people on the roof that were not there.  Symptoms unresolved.  Family numbers noted he is at his baseline.  No strokelike symptoms during episode.  No chest pain or shortness of breath.   Altered Mental Status      Home Medications Prior to Admission medications   Medication Sig Start Date End Date Taking? Authorizing Provider  acetaminophen (TYLENOL) 325 MG tablet Take 1-2 tablets (325-650 mg total) by mouth every 6 (six) hours as needed for mild pain (pain score 1-3). 07/19/23   Stehler, Oren Bracket, PA-C  albuterol (PROVENTIL HFA;VENTOLIN HFA) 108 (90 BASE) MCG/ACT inhaler Inhale 2 puffs into the lungs every 6 (six) hours as needed for shortness of breath.    [provider]  apixaban (ELIQUIS) 5 MG TABS tablet Take 1 tablet (5 mg total) by mouth 2 (two) times daily. 07/21/23   Stehler, Oren Bracket, PA-C  budesonide-formoterol (SYMBICORT) 160-4.5 MCG/ACT inhaler Inhale 2 puffs into the lungs 2 (two) times daily. 03/08/23   [provider]  cyanocobalamin (,VITAMIN B-12,) 1000 MCG/ML injection Inject 1 mL into the skin every 30 (thirty) days. 02/04/19   [provider]  empagliflozin (JARDIANCE) 10 MG TABS tablet Take 1 tablet (10 mg total) by mouth daily. 08/21/23   Parcells, Therisa Doyne, PA-C  fexofenadine (ALLEGRA) 180 MG tablet Take 180 mg by mouth daily.    [provider]  fluticasone (FLONASE) 50 MCG/ACT nasal spray Place 2 sprays into both nostrils 2 (two) times daily.    [provider]   levothyroxine (SYNTHROID) 75 MCG tablet Take 75 mcg by mouth daily before breakfast.    [provider]  metoprolol succinate (TOPROL-XL) 50 MG 24 hr tablet Take 1 tablet (50 mg total) by mouth daily. Take with or immediately following a meal. 09/09/23   Munley, Iline Oven, MD  midodrine (PROAMATINE) 5 MG tablet Take 1 tablet (5 mg total) by mouth 3 (three) times daily with meals. Take the third dose of this medication at 3 pm 10/04/23   Baldo Daub, MD  omeprazole (PRILOSEC) 20 MG capsule Take 20 mg by mouth in the morning.    [provider]  potassium chloride SA (KLOR-CON M) 20 MEQ tablet Take 1 tablet (20 mEq total) by mouth as needed. Take one tablet once a day for three days, then take one tablet daily as needed when you take your Lasix. 07/29/23   Flossie Dibble, NP  rosuvastatin (CRESTOR) 20 MG tablet Take 1 tablet (20 mg total) by mouth daily. 01/22/23   Baldo Daub, MD  solifenacin (VESICARE) 10 MG tablet Take 10 mg by mouth daily. 01/17/21   [provider]  tamsulosin (FLOMAX) 0.4 MG CAPS capsule Take 1 capsule (0.4 mg total) by mouth daily. 09/09/23   Baldo Daub, MD      Allergies    Penicillins, Iodinated contrast media, Asa [aspirin], Metformin and related, Monodox [doxycycline hyclate], Sulfa antibiotics, and Ultram [tramadol]    Review of Systems   Review of Systems  Physical Exam Updated Vital Signs BP 111/88   Pulse 90   Temp 98.8 F (37.1 C) (Oral)   Resp 20   SpO2 96%  Physical Exam Vitals and nursing note reviewed.  Constitutional:      General: He is not in acute distress.    Appearance: He is not toxic-appearing.  HENT:     Head: Normocephalic.     Nose: Nose normal.     Mouth/Throat:     Mouth: Mucous membranes are moist.  Eyes:     Conjunctiva/sclera: Conjunctivae normal.  Cardiovascular:     Rate and Rhythm: Normal rate and regular rhythm.  Pulmonary:     Effort: Pulmonary effort is normal.     Breath sounds:  Normal breath sounds.  Abdominal:     General: Abdomen is flat. There is no distension.     Tenderness: There is no abdominal tenderness. There is no guarding or rebound.  Musculoskeletal:        General: Normal range of motion.  Skin:    General: Skin is warm and dry.     Capillary Refill: Capillary refill takes less than 2 seconds.  Neurological:     General: No focal deficit present.     Mental Status: He is alert.  Psychiatric:        Mood and Affect: Mood normal.        Behavior: Behavior normal.     ED Results / Procedures / Treatments   Labs (all labs ordered are listed, but only abnormal results are displayed) Labs Reviewed  COMPREHENSIVE METABOLIC PANEL - Abnormal; Notable for the following components:      Result Value   Sodium 133 (*)    Glucose, Bld 130 (*)    All other components within normal limits  CBC - Abnormal; Notable for the following components:   WBC 12.6 (*)    Hemoglobin 10.1 (*)    HCT 35.2 (*)    MCV 73.6 (*)    MCH 21.1 (*)    MCHC 28.7 (*)    RDW 16.1 (*)    All other components within normal limits  URINALYSIS, ROUTINE W REFLEX MICROSCOPIC - Abnormal; Notable for the following components:   Glucose, UA >=500 (*)    All other components within normal limits  CBG MONITORING, ED - Abnormal; Notable for the following components:   Glucose-Capillary 131 (*)    All other components within normal limits    EKG None  Radiology DG Chest Portable 1 View Result Date: 11/08/2023 CLINICAL DATA:  Altered mental status. EXAM: PORTABLE CHEST 1 VIEW COMPARISON:  August 16, 2023 FINDINGS: There is stable dual lead AICD positioning. Multiple sternal wires and vascular clips are noted. The heart size and mediastinal contours are within normal limits. An artificial aortic valve is seen. There is no evidence of an acute infiltrate, pleural effusion or pneumothorax. The visualized skeletal structures are unremarkable. IMPRESSION: 1. Evidence of prior median  sternotomy/CABG. 2. No acute or active cardiopulmonary disease. Electronically Signed   By: Aram Candela M.D.   On: 11/08/2023 01:07   CT Head Wo Contrast Result Date: 11/08/2023 CLINICAL DATA:  Altered mental status. EXAM: CT HEAD WITHOUT CONTRAST TECHNIQUE: Contiguous axial images were obtained from the base of the skull through the vertex without intravenous contrast. RADIATION DOSE REDUCTION: This exam was performed according to the departmental dose-optimization program which includes automated exposure control, adjustment of the mA and/or kV according to patient size and/or use of iterative reconstruction  technique. COMPARISON:  September 27, 2023 FINDINGS: Brain: There is generalized cerebral atrophy with widening of the extra-axial spaces and ventricular dilatation. There are areas of decreased attenuation within the white matter tracts of the supratentorial brain, consistent with microvascular disease changes. Vascular: Moderate severity bilateral cavernous carotid artery calcification is noted. Skull: Normal. Negative for fracture or focal lesion. Sinuses/Orbits: No acute finding. Other: None. IMPRESSION: 1. Generalized cerebral atrophy and microvascular disease changes of the supratentorial brain. 2. No acute intracranial abnormality. Electronically Signed   By: Aram Candela M.D.   On: 11/08/2023 01:05    Procedures Procedures    Medications Ordered in ED Medications - No data to display  ED Course/ Medical Decision Making/ A&P Clinical Course as of 11/08/23 0233  Fri Nov 08, 2023  0054 Labs with mild leukocytosis, no fever or tachycardia to suggest systemic infection.  Mild anemia noted appears to be stable compared to prior.  Comprehensive metabolic panel with mild hyponatremia, no other significant metabolic derangements.  No transaminitis to suggest hepatobiliary disease or hepatic encephalopathy.  Does not appear to be in DKA.  Blood sugar normal. [TY]    Clinical Course  User Index [TY] Coral Spikes, DO                                 Medical Decision Making This is an 83 year old male presenting emergency department for transient altered mental status.  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  Vital signs reassuring.  Nonfocal neurologic exam.  Per chart review has an outpatient EEG and MRIs which were done largely reassuring.  Follows with neurology.  Workup as noted in ED course also reassuring.  He has follow-up with his neurologist on Wednesday.  Unclear etiology for symptoms.  However, given reassuring vitals, exam and workup today with him being at neurologic baseline; feel that he is stable for discharge.  I do not think he had a stroke.  Possible near syncope?  As he does have history of same per chart review.  Amount and/or Complexity of Data Reviewed Independent Historian:     Details: Family members at bedside note that he is at his baseline. Labs: ordered. Radiology: ordered. ECG/medicine tests: ordered.  Risk Decision regarding hospitalization.          Final Clinical Impression(s) / ED Diagnoses Final diagnoses:  None    Rx / DC Orders ED Discharge Orders     None         Coral Spikes, DO 11/08/23 0236

## 2023-11-08 NOTE — ED Notes (Signed)
 Reviewed D/C information with the patient, pt verbalized understanding. No additional concerns at this time.

## 2023-11-13 ENCOUNTER — Encounter: Payer: Self-pay | Admitting: Neurology

## 2023-11-13 ENCOUNTER — Ambulatory Visit: Admitting: Neurology

## 2023-11-13 VITALS — BP 139/63 | HR 74 | Ht 65.0 in | Wt 133.0 lb

## 2023-11-13 DIAGNOSIS — R413 Other amnesia: Secondary | ICD-10-CM

## 2023-11-13 DIAGNOSIS — Z8673 Personal history of transient ischemic attack (TIA), and cerebral infarction without residual deficits: Secondary | ICD-10-CM | POA: Diagnosis not present

## 2023-11-13 DIAGNOSIS — R251 Tremor, unspecified: Secondary | ICD-10-CM

## 2023-11-13 NOTE — Progress Notes (Unsigned)
 [facility] [address] [place]. [place] [id number] [phone]       OFFICE FOLLOW UP VISIT OTE  [name] Date of Birth:  10-Sep-1940 Medical Record Number:  [id number]   Referring MD: Gus Height, PA-C  Reason for Referral: Strokes  [id number]; Visit 03/21/2023 Bruce Nunez is a pleasant 83 year old Caucasian male seen today for initial office consultation visit for stroke.  History is obtained from the patient and review of referral notes.  I personally reviewed available records and electronic medical records and imaging films in PACS.  Bruce Nunez has past medical history of diabetes, hypertension, hyperlipidemia, gastroesophageal reflux disease.  Patient states Bruce Nunez has had 2 episodes of possible strokes.  The first 1 was on February 08, 2022 Bruce Nunez had driven to [facility], [place] for a  for religious conference.  Is reached that Bruce Nunez has noticed that Bruce Nunez was mainly leaning to 1 side.  Bruce Nunez pulled his car over to [employer] parking lot.  As Bruce Nunez got up Bruce Nunez noticed Bruce Nunez was drifting to the right.  His right leg felt heavy and was twisting when Bruce Nunez was walking.  Bruce Nunez was able to hold onto the side of the car.  The symptoms lasted for about an hour or so.  Eventually improved and Bruce Nunez was able to drive to his motel.  Bruce Nunez skipped the conference.  Bruce Nunez eventually got medical help only after Bruce Nunez came back to [place] a few days later.  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  Patient states Bruce Nunez did quite well in the interim.  On 02/14/2023 Bruce Nunez had a somewhat similar episode while Bruce Nunez was driving Bruce Nunez felt that his car kept on being pulled to the right and Bruce Nunez had to correct it.  After doing this for 3-4 times Bruce Nunez eventually pulled over and Bruce Nunez checked his tire when Bruce Nunez got home but it was not flat.  Bruce Nunez again noticed that his right leg was weak and acting funny could not tested.  Bruce Nunez denied any fall, injury, back pain, radicular  pain.  Patient also admitted to his having some memory difficulties and confusion during these episodes.  Bruce Nunez had a repeat CT scan of the head on 02/27/2023 which again showed mild changes of atrophy and small vessel disease.  No acute abnormality.  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  Bruce Nunez has not had any recent lab work for lipid profile and hemoglobin A1c checked.  Bruce Nunez is allergic to aspirin and develops hives.  Bruce Nunez is currently not on any antiplatelet agents.  Bruce Nunez is independent in activities of daily living.  Bruce Nunez lives with his wife.  Bruce Nunez does admit to mild short-term memory difficulties particularly for recent events Bruce Nunez finds it difficult to remember.  Bruce Nunez denies any known prior history of strokes, seizures, TIA or significant neurological's. Update 11/13/2023 : Bruce Nunez returns for follow-up after last visit with me 8 months ago.  Bruce Nunez is accompanied by his wife.  At last visit I will order an MRI scan of the brain which was done on 05/08/2023 which showed a small left periventricular lacunar acute infarct and old left basal ganglia infarct and changes of small vessel disease.  MR angiogram study of the brain and neck blood vessels showed no significant large vessel  stenosis or occlusion.  Patient subsequently had elective CABG surgery on 07/05/2023 and woke up with confusion and sleepiness after surgery.  MRI scan on 07/10/2023 showed punctate areas of acute infarcts involving left temporal occipital junction, right parietal subcortical white matter and right superior frontal gyrus.  Patient states that since then Bruce Nunez has noticed worsening cognitive difficulties.  Short-term memory is poor.  Bruce Nunez has trouble at times finding words and occasionally stops midsentence.  Of late wife has also noticed some delusions and hallucinations.  Bruce Nunez is quite disoriented easily and gets confused easily.   Patient does have family history of dementia in mother and aunt.  The last few months patient has been having some episodes which she describes as spells.  These come on suddenly.  Bruce Nunez has a feeling they do not come on.  Bruce Nunez feels able to grab onto objects and tries to sit down Bruce Nunez may stop it.  In this episode Bruce Nunez started shaking all over and becomes quite weak and has to sit down.  Bruce Nunez is unable to get up and needs 1 or 2 person assist.  Bruce Nunez episodes drain him and Bruce Nunez feels tired and sleepy for couple of days.  Last episode was a few days ago and Bruce Nunez states Bruce Nunez is still not fully recovered.  Bruce Nunez is fully awake during these episodes and they have been occurring at a variable frequency once or twice a week.  There are no obvious triggers for these episodes.  Bruce Nunez denies any loss of consciousness, tongue bite, tonic-clonic activity.  There is no prior history of seizures.  Bruce Nunez does complain of some posterior neck pain and muscle tightness but denies any recent fall or neck injury.  Bruce Nunez denies radicular pain on numbness.  On cognitive testing today Bruce Nunez scored 25/30 on Mini-Mental which is only slightly lower than 27/30 at last visit in July 2024. ROS:   14 system review of systems is positive for difficulty walking, imbalance, leg weakness, memory loss, vision difficulty, leg pain all other systems negative  PMH:  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   Heart murmur    Hemorrhoids    History of kidney  stones    Hx of colon cancer, stage I    Hx of nonmelanoma skin cancer    Hx of peptic ulcer    as teenager   Hx of transfusion    Hyperlipidemia    Hypertension    Inguinal hernia    PAF (paroxysmal atrial fibrillation) (HCC) 08/20/2023   PONV (postoperative nausea and vomiting)    only when had Ether as Anesthesia drug   Prostate cancer (HCC) 08/15/2012   also Hx colon cancer / Hx skin cancer   Right inguinal hernia 05/08/2016   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   S/P right inguinal hernia repair Sept 2015 05/17/2014   Recurrent right indirect  Hernia treated with robotic  TAPP repair with enterolysis Sept 2017   Stroke [facility]) 03/28/2022   Syncope 08/17/2023    Social History:  Social History   Socioeconomic History   Marital status: Married    Spouse name: Not on file   Number of children: 2   Years of education: Not on file   Highest education level: Not on file  Occupational History   Occupation: Retired    Comment: Designer, fashion/clothing  Tobacco Use   Smoking status: Never    Passive exposure: Never   Smokeless tobacco: Never  Vaping Use   Vaping status: Never Used  Substance and Sexual Activity   Alcohol use: No   Drug use: No   Sexual activity: Not on file  Other Topics Concern   Not on file  Social History Narrative   Not on file   Social Drivers of Health   Financial Resource Strain: Not on file  Food Insecurity: No Food Insecurity (08/17/2023)   Hunger Vital Sign    Worried About Running Out of Food in the Last Year: Never true    Ran Out of Food in the Last Year: Never true  Transportation Needs: No Transportation Needs (08/17/2023)   PRAPARE - Administrator, Civil Service (Medical): No    Lack of Transportation (Non-Medical): No  Physical Activity: Not on file  Stress: Not on file  Social Connections: Not on file  Intimate Partner Violence: Not At Risk (08/17/2023)   Humiliation, Afraid, Rape, and Kick  questionnaire    Fear of Current or Ex-Partner: No    Emotionally Abused: No    Physically Abused: No    Sexually Abused: No    Medications:   Current Outpatient Medications on File Prior to Visit  Medication Sig Dispense Refill   acetaminophen (TYLENOL) 325 MG tablet Take 1-2 tablets (325-650 mg total) by mouth every 6 (six) hours as needed for mild pain (pain score 1-3).     albuterol (PROVENTIL HFA;VENTOLIN HFA) 108 (90 BASE) MCG/ACT inhaler Inhale 2 puffs into the lungs every 6 (six) hours as needed for shortness of breath.     apixaban (ELIQUIS) 5 MG TABS tablet Take 1 tablet (5 mg total) by mouth 2 (two) times daily.     budesonide-formoterol (SYMBICORT) 160-4.5 MCG/ACT inhaler Inhale 2 puffs into the lungs 2 (two) times daily.     cyanocobalamin (,VITAMIN B-12,) 1000 MCG/ML injection Inject 1 mL into the skin every 30 (thirty) days.     empagliflozin (JARDIANCE) 10 MG TABS tablet Take 1 tablet (10 mg total) by mouth daily. 90 tablet 2   fexofenadine (ALLEGRA) 180 MG tablet Take 180 mg by mouth daily.     fluticasone (FLONASE) 50 MCG/ACT nasal spray Place 2 sprays into both nostrils 2 (two) times daily.     levothyroxine (SYNTHROID) 75 MCG tablet Take 75 mcg by mouth daily before breakfast.     metoprolol succinate (TOPROL-XL) 50 MG 24 hr tablet Take 1 tablet (50 mg total) by mouth daily. Take with or immediately following a meal. 90 tablet 3   midodrine (PROAMATINE) 5 MG tablet Take 1 tablet (5 mg total) by mouth 3 (three) times daily with meals. Take the third dose of this medication at 3 pm 270 tablet 3   omeprazole (PRILOSEC) 20 MG capsule Take 20 mg by mouth in the morning.     rosuvastatin (CRESTOR) 20 MG tablet Take 1 tablet (20 mg total) by mouth daily. 90 tablet 1   solifenacin (VESICARE) 10 MG  tablet Take 10 mg by mouth daily.     tamsulosin (FLOMAX) 0.4 MG CAPS capsule Take 1 capsule (0.4 mg total) by mouth daily. 90 capsule 3   No current facility-administered medications  on file prior to visit.    Allergies:   Allergies  Allergen Reactions   Penicillins Anaphylaxis, Shortness Of Breath, Itching and Swelling    Throat swells up but no intubation per wife   Iodinated Contrast Media Hives and Rash   Asa [Aspirin] Other (See Comments)    Hx of GI bleeds   Metformin And Related Diarrhea   Monodox [Doxycycline Hyclate] Nausea Only   Sulfa Antibiotics Itching and Swelling   Ultram [Tramadol] Nausea And Vomiting    Physical Exam General: well developed, well nourished pleasant elderly Caucasian male, seated, in no evident distress Head: head normocephalic and atraumatic.   Neck: supple with no carotid or supraclavicular bruits Cardiovascular: regular rate and rhythm, harsh ejection systolic murmur heard throughout precordium and in both neck Musculoskeletal: no deformity.  Mild posterior neck muscle spasm and slight restriction of movements. Skin:  no rash/petichiae Vascular:  Normal pulses all extremities  Neurologic Exam Mental Status: Awake and fully alert. Oriented to place and time. Recent and remote memory intact. Attention span, concentration and fund of knowledge appropriate. Mood and affect appropriate.  Diminished recall 2/3.  Able to name 13 animals which can walk on 4 legs.  Clock drawing 4/4. Cranial Nerves: Fundoscopic exam reveals sharp disc margins. Pupils equal, briskly reactive to light. Extraocular movements full without nystagmus. Visual fields full to confrontation. Hearing intact. Facial sensation intact. Face, tongue, palate moves normally and symmetrically.  Motor: Normal bulk and tone. Normal strength in all tested extremity muscles. Sensory.: intact to touch , pinprick , position and vibratory sensation.  Coordination: Rapid alternating movements normal in all extremities. Finger-to-nose and heel-to-shin performed accurately bilaterally. Gait and Station: Arises from chair without difficulty. Stance is normal. Gait demonstrates  normal stride length and balance . Able to heel, toe and tandem walk with great difficulty.  Reflexes: 2+ and symmetric. Toes downgoing.   NIHSS  0 Modified Rankin  1     11/13/2023    2:20 PM 03/21/2023   10:14 AM  MMSE - Mini Mental State Exam  Orientation to time 5 5  Orientation to Place 5 5  Registration 3 3  Attention/ Calculation 4 4  Recall 1 3  Language- name 2 objects 2 2  Language- repeat 0 1  Language- follow 3 step command 3 2  Language- read & follow direction 1 1  Write a sentence 1 1  Copy design 0 0  Total score 25 27  25  out of 30 this patient was 27 out of 30 in July 13 animals to   ASSESSMENT: 83 year old Caucasian male with 2 episodes of transient right leg weakness and some memory difficulties possibly left hemispheric subcortical TIAs.  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  Vascular risk factors of hypertension, hyperlipidemia and age. 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  New complaints of intermittent episodes of sudden onset of extremity shaking and generalized weakness without loss of consciousness of unclear etiology.  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  I recommend further evaluation by checking dementia panel labs.  I also encouraged him to  increase participation in cognitively challenging activities like solving crossword puzzles, playing bridge and sudoku.  We also discussed memory compensation strategies.  Bruce Nunez will return for follow-up in the future in 3 months with my nurse practitioner call earlier if necessary.  Greater than 50% time during this 40 minute visit was spent on counseling and coordination of care about his TIA versus small strokes and mild cognitive impairment and answering questions. Delia Heady, MD  Note: This document was prepared with digital dictation and possible smart phrase technology. Any transcriptional errors that result from this process are unintentional.

## 2023-11-13 NOTE — Progress Notes (Unsigned)
 Cardiology Office Note:  .   Date:  11/13/2023  ID:  Jetta Lout, DOB 06/17/1941, MRN [id number] PCP: Gus Height, PA   HeartCare Providers Cardiologist:  Norman Herrlich, MD {  History of Present Illness: .   Bruce Nunez is a 83 y.o. 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 Very likely to see AFib for him again down the road as well  PPM implanted 07/17/23 Toprol resumed post implant Discharged 07/20/23  Admitted 08/17/23 with recurrent falls, arrived in ST, normotensive > hypotensive.  orthostatics +, amlodipine stopped. Though eventually started on diltiazem for faster HRs Discharged 08/20/23  Seeing Dr. Markus Daft a few times post discharge, last on 10/04/23, despite midodrine BID continued symptoms > increased to TID  11/08/23, ER visit > AMS with hallucinations, Vital signs reassuring. Nonfocal neurologic exam with/u in the ER, mentioned he had follow-up with his neurologist on Wednesday.  Discharged from the ER   Today's visit is scheduled as his 91 day post implant visit  ROS:   He is accompanied by his wife Overall has had slow but steady improvement. Initially once home was using a lift chair to get upstairs now able to walk up on his own Gets lightheaded especially standing, some weak spells, but this is improved sine his last visit with Dr. Dulce Sellar  No CP, palpitations No  SOB No syncope  Device information Abbott dual chamber PPM implanted 07/17/23  Arrhythmia/AAD hx AFib > post-op CABG/AVR  Studies Reviewed: Marland Kitchen    EKG SR 73bpm,  RBBB  DEVICE interrogation done today and reviewed by myself Battery and lead measurements are good <% 1 AF burden, last was in Dec 2023   08/18/23: limited TTE IMPRESSIONS   1. Left ventricular ejection fraction, by estimation, is 70 to 75%. The  left ventricle has hyperdynamic function. The left ventricle has no  regional wall motion abnormalities. There is mild left ventricular  hypertrophy.   2. Right ventricular systolic function is normal. The right ventricular  size is normal. Tricuspid regurgitation signal is inadequate for assessing  PA pressure.   3. A small pericardial effusion is present.   4. The mitral valve is normal in structure. Trivial mitral valve  regurgitation.   5. The aortic valve has been repaired/replaced. Aortic valve  regurgitation is not visualized. There is a 21 mm Inspiris valve present  in the aortic position. Vmax 2.6 m/s, MG , EOA 1.1 cm^2, DI 0.5   6. The inferior vena cava is normal in size with greater than 50%  respiratory variability, suggesting right atrial pressure of 3 mmHg.    07/05/23: inter-op TEE POST-OP IMPRESSIONS  _ Left Ventricle: The left ventricle is unchanged from pre-bypass.  _ Right Ventricle: The right ventricle appears unchanged from pre-bypass.  _ Aorta: The aorta appears unchanged from pre-bypass.  _ Left Atrial Appendage: The left atrial appendage appears unchanged from  pre-bypass.  _ Aortic Valve: A bioprosthetic valve was placed, leaflets are freely  mobile  Size; 21mm. The gradient recorded across the prosthetic valve is within  the  expected range. No  perivalvular leak noted. Mean PG  _ Mitral Valve: The mitral valve appears unchanged from pre-bypass.  _ Tricuspid Valve: The tricuspid valve appears unchanged from pre-bypass.  _ Pulmonic Valve: The pulmonic valve appears unchanged from pre-bypass.  _ Interatrial Septum: The interatrial septum appears unchanged from  pre-bypass.  _ Pericardium: The  pericardium appears unchanged from pre-bypass.  _ Comments: S/P AVR CABG. No new or worsening wall motion or valvular  abnormalities post procedure. 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 Seated appropriately without rock or leak. Gradients WNL.   PRE-OP FINDINGS   Left Ventricle: The left ventricle has normal systolic function, with an  ejection fraction of 60-65%. The cavity size was normal. Left ventricular  diastolic parameters are consistent with Grade I diastolic dysfunction  (impaired relaxation).    Right Ventricle: The right ventricle has normal systolic function. The  cavity was normal. There is no increase in right ventricular wall  thickness. Right ventricular systolic pressure is normal.   Left Atrium: Left atrial size was normal in size. No left atrial/left  atrial appendage thrombus was detected. Left atrial appendage velocity is  normal at greater than 40 cm/s.   Right Atrium: Right atrial size was normal in size.   Interatrial Septum: No atrial level shunt detected by color flow Doppler.  The interatrial septum appears to be lipomatous. There is no evidence of a  patent foramen ovale.   Pericardium: There is no evidence of pericardial effusion. There is no  pleural effusion.   Mitral Valve: The mitral valve is normal in structure. Mitral valve  regurgitation is trivial by color flow Doppler. There is no evidence of  mitral valve vegetation. There is No evidence of mitral stenosis.   Tricuspid Valve: The tricuspid valve was normal in structure. Tricuspid  valve regurgitation is trivial by color flow Doppler. No evidence of  tricuspid stenosis is present. There is no evidence of tricuspid valve  vegetation.   Aortic Valve: The aortic valve is tricuspid. Aortic valve regurgitation is  moderate to severe. The jet is eccentric anteriorly directed. There is  mild stenosis of the aortic valve, with a calculated valve area of 1.40  cm.  AI  PHT , VC .7cm, diastolic reversal descending thoracic aorta, EROA  PISA .32cm2.   Pulmonic Valve: The pulmonic valve was normal in structure, with normal.  No evidence of pumonic stenosis.  Pulmonic valve regurgitation is mild by color flow Doppler.    Aorta: The aortic root and ascending aorta are normal in size and  structure.   Pulmonary Artery: Theone Murdoch catheter present on the right. The pulmonary  artery is of normal size.   Shunts: There is no evidence of an atrial septal defect.      05/03/23: LHC  Prox RCA to Mid RCA lesion is 50% stenosed.   Mid LM to Dist LM lesion is 30% stenosed.   Prox Cx to Dist Cx lesion is 50% stenosed.   Prox LAD to Mid LAD lesion is 50% stenosed.   1st Diag lesion is 60% stenosed.   There is severe (4+) aortic regurgitation.   1.  Moderate multivessel coronary artery disease with mild distal left main plaquing of 30%, moderate diffuse LAD stenosis of 50%, mild to moderate mid circumflex stenosis of 40 to 50%, and moderate mid RCA stenosis of 50%. 2.  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  Severe right subclavian tortuosity makes cardiac catheterization very difficult  from right radial access.  If patient requires future cardiac catheterization, avoid right radial access.   Recommendations: Heart team review, cardiac CTA (TAVR protocol) to evaluate if there is enough aortic valve calcification to perform TAVR as aortic insufficiency appears to be the primary valve lesion.  The patient has a very wide pulse pressure and angiographically has severe AI.   Risk Assessment/Calculations:    Physical Exam:   VS:  There were no vitals taken for this visit.   Wt Readings from Last 3 Encounters:  11/13/23 133 lb (60.3 kg)  10/04/23 134 lb 6.4 oz (61 kg)  09/09/23 133 lb 12.8 oz (60.7 kg)    GEN: Well nourished, well developed in no acute distress NECK: No JVD; No carotid bruits CARDIAC: RRR, no murmurs, rubs,  gallops RESPIRATORY:  CTA b/l without rales, wheezing or rhonchi  ABDOMEN: Soft, non-tender, non-distended EXTREMITIES: No edema; No deformity   PPM site: is stable, no thinning, fluctuation, tethering  ASSESSMENT AND PLAN: .    PPM intact function no programming changes made  Paroxysmal Afib CHA2DS2Vasc is 6, on eliquis, appropriately dosed (weight/renal function) <1 % burden, last was in Dec 2024  CAD/VHD S/p CABG/AVR (bioprosthetic) C/w Dr. Markus Daft  Orthostatic hypotension/symptoms Improving symptoms on midodrine Given low AF burden now > will reach out to Dr. Mathis Bud, perhaps able to reduce his BB dose C/w dr. Markus Daft  Secondary hypercoagulable state 2/2 AFib     Dispo: remotes as usual, in clinic again with EP in a year, sooner if needed  Signed, Sheilah Pigeon, PA-C

## 2023-11-13 NOTE — Patient Instructions (Signed)
 I had a long discussion with the patient and his wife with regards to his current episodes of trembling and generalized weakness being of unclear etiology and recommend further evaluation by checking EEG, CT angiogram of brain and neck and MRI scan cervical spine.  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  I recommend further evaluation by checking dementia panel labs.  I also encouraged him to increase participation in cognitively challenging activities like solving crossword puzzles, playing bridge and sudoku.  We also discussed memory compensation strategies.  He will return for follow-up in the future in 3 months with my nurse practitioner call earlier if necessary.  Memory Compensation Strategies  Use "WARM" strategy.  W= write it down  A= associate it  R= repeat it  M= make a mental note  2.   You can keep a Glass blower/designer.  Use a 3-ring notebook with sections for the following: calendar, important names and phone numbers,  medications, doctors' names/phone numbers, lists/reminders, and a section to journal what you did  each day.   3.    Use a calendar to write appointments down.  4.    Write yourself a schedule for the day.  This can be placed on the calendar or in a separate section of the Memory Notebook.  Keeping a  regular schedule can help memory.  5.    Use medication organizer with sections for each day or morning/evening pills.  You may need help loading it  6.    Keep a basket, or pegboard by the door.  Place items that you need to take out with you in the basket or on the pegboard.  You may also want to  include a message board for reminders.  7.    Use sticky notes.  Place sticky notes with reminders in a place where the task is performed.  For example: " turn off the  stove" placed by the stove, "lock the door" placed on the door at eye level, " take your medications" on   the bathroom mirror or by the place where you normally take your medications.  8.    Use alarms/timers.  Use while cooking to remind yourself to check on food or as a reminder to take your medicine, or as a  reminder to make a call, or as a reminder to perform another task, etc.

## 2023-11-14 ENCOUNTER — Ambulatory Visit: Attending: Physician Assistant | Admitting: Physician Assistant

## 2023-11-14 ENCOUNTER — Encounter: Payer: Self-pay | Admitting: Physician Assistant

## 2023-11-14 VITALS — BP 116/78 | HR 75 | Resp 16 | Ht 65.0 in | Wt 134.6 lb

## 2023-11-14 DIAGNOSIS — Z952 Presence of prosthetic heart valve: Secondary | ICD-10-CM

## 2023-11-14 DIAGNOSIS — I2510 Atherosclerotic heart disease of native coronary artery without angina pectoris: Secondary | ICD-10-CM

## 2023-11-14 DIAGNOSIS — D6869 Other thrombophilia: Secondary | ICD-10-CM

## 2023-11-14 DIAGNOSIS — Z950 Presence of cardiac pacemaker: Secondary | ICD-10-CM | POA: Diagnosis not present

## 2023-11-14 DIAGNOSIS — I951 Orthostatic hypotension: Secondary | ICD-10-CM

## 2023-11-14 DIAGNOSIS — I480 Paroxysmal atrial fibrillation: Secondary | ICD-10-CM

## 2023-11-14 LAB — CUP PACEART INCLINIC DEVICE CHECK
Battery Remaining Longevity: 136 mo
Battery Voltage: 3.02 V
Brady Statistic RA Percent Paced: 0.49 %
Brady Statistic RV Percent Paced: 0.35 %
Date Time Interrogation Session: 20250320172822
Implantable Lead Connection Status: 753985
Implantable Lead Connection Status: 753985
Implantable Lead Implant Date: 20241120
Implantable Lead Implant Date: 20241120
Implantable Lead Location: 753859
Implantable Lead Location: 753860
Implantable Pulse Generator Implant Date: 20241120
Lead Channel Impedance Value: 400 Ohm
Lead Channel Impedance Value: 475 Ohm
Lead Channel Pacing Threshold Amplitude: 0.75 V
Lead Channel Pacing Threshold Amplitude: 1 V
Lead Channel Pacing Threshold Pulse Width: 0.5 ms
Lead Channel Pacing Threshold Pulse Width: 0.5 ms
Lead Channel Sensing Intrinsic Amplitude: 3.1 mV
Lead Channel Sensing Intrinsic Amplitude: 5 mV
Lead Channel Setting Pacing Amplitude: 1 V
Lead Channel Setting Pacing Amplitude: 2 V
Lead Channel Setting Pacing Pulse Width: 0.5 ms
Lead Channel Setting Sensing Sensitivity: 0.5 mV
Pulse Gen Model: 2272
Pulse Gen Serial Number: 8213495

## 2023-11-14 NOTE — Patient Instructions (Signed)
Medication Instructions:   Your physician recommends that you continue on your current medications as directed. Please refer to the Current Medication list given to you today.   *If you need a refill on your cardiac medications before your next appointment, please call your pharmacy*   Lab Work: NONE ORDERED  TODAY   If you have labs (blood work) drawn today and your tests are completely normal, you will receive your results only by: MyChart Message (if you have MyChart) OR A paper copy in the mail If you have any lab test that is abnormal or we need to change your treatment, we will call you to review the results.   Testing/Procedures:  NONE ORDERED  TODAY     Follow-Up: At [facility], you and your health needs are our priority.  As part of our continuing mission to provide you with exceptional heart care, we have created designated Provider Care Teams.  These Care Teams include your primary Cardiologist (physician) and Advanced Practice Providers (APPs -  Physician Assistants and Nurse Practitioners) who all work together to provide you with the care you need, when you need it.  We recommend signing up for the patient portal called "MyChart".  Sign up information is provided on this After Visit Summary.  MyChart is used to connect with patients for Virtual Visits (Telemedicine).  Patients are able to view lab/test results, encounter notes, upcoming appointments, etc.  Non-urgent messages can be sent to your provider as well.   To learn more about what you can do with MyChart, go to [email or web address].    Your next appointment:   1 year(s)  Provider:   Loman Brooklyn, MD or Francis Dowse, PA-C    Other Instructions

## 2023-11-15 LAB — DEMENTIA PANEL
Homocysteine: 8.4 umol/L (ref 0.0–21.3)
TSH: 2 u[IU]/mL (ref 0.450–4.500)
Vitamin B-12: 758 pg/mL (ref 232–1245)

## 2023-11-16 NOTE — Progress Notes (Signed)
Kindly inform the patient that lab work for reversible causes of memory loss was all quite satisfactory

## 2023-11-19 ENCOUNTER — Other Ambulatory Visit: Payer: Self-pay

## 2023-11-19 MED ORDER — MIDODRINE HCL 5 MG PO TABS
5.0000 mg | ORAL_TABLET | Freq: Three times a day (TID) | ORAL | 2 refills | Status: DC
Start: 2023-11-19 — End: 2024-05-30

## 2023-11-19 NOTE — Progress Notes (Signed)
 Remote pacemaker transmission.

## 2023-11-20 ENCOUNTER — Ambulatory Visit: Admitting: Neurology

## 2023-11-20 ENCOUNTER — Telehealth: Payer: Self-pay | Admitting: Neurology

## 2023-11-20 DIAGNOSIS — R251 Tremor, unspecified: Secondary | ICD-10-CM

## 2023-11-20 NOTE — Telephone Encounter (Signed)
 MRI cervical [employer] medicare auth: [id number] exp. 11/20/23-12/19/23  CTA head/neck [employer] medicare Berkley Harvey: [id number] exp. 11/20/23-12/19/23 sent to [facility] [phone]

## 2023-11-25 ENCOUNTER — Telehealth: Payer: Self-pay | Admitting: Neurology

## 2023-11-25 ENCOUNTER — Other Ambulatory Visit: Payer: Self-pay

## 2023-11-25 MED ORDER — PREDNISONE 50 MG PO TABS: ORAL_TABLET | ORAL | 0 refills | Status: DC

## 2023-11-25 NOTE — Telephone Encounter (Signed)
 Pt's wife called wanting to know when the pt's Prednisone will be called in. Please advise.

## 2023-11-25 NOTE — Telephone Encounter (Signed)
 Received a notification from the radiology department that the patient has a contrast allergy. He will have to undergo a premedicated prep that starts 13 hrs prior to the procedure. I will send in a script for prednisonse 50 mg that he takes 13 hrs prior to the scheduled appointment. He will take dose 2, 7 hours before the scheduled appointment and then he will take the remaining dose 1 hour prior to the scheduled appointment along with a benadryl 50 mg tablet.  I have [facility] listed as the pharmacy will need to confirm that is where the prednisone script should go.   "50 mg Prednisone PO 13, 7 and 1 hour before the injection.  50 mg Diphenhydramine (Benadryl) IV/PO within 1 hour of the injection. The benadryl will need to be PO 1 hr before his appointment since he is outpatient "

## 2023-11-25 NOTE — Telephone Encounter (Signed)
 Pt wife is asking that Dr Pearlean Brownie sends order to [facility] for lab work Dr Pearlean Brownie wants pt to have. The fax # to send order to is in [place], [place] fax#[phone]

## 2023-11-25 NOTE — Telephone Encounter (Signed)
 Spoke w/Pt spouse regarding labs for CT as well as scripts needed per radiology dept. Spouse stated [facility]ology stated Pt needed labs prior to CT. Discussed w/spouse that labs were completed on 11/07/23 and those should be sufficient. Also informed spouse radiology had reached out to [place] this AM regarding need for prophylactic meds prior to CT and did not mention needing labs for any reason. Confirmed pharmacy on record w/spouse and discussed the administration details of both prednisone and Benadryl. Advised if spouse had further questions to call back. Spouse stated understanding and thankful for call.

## 2023-11-25 NOTE — Telephone Encounter (Signed)
 Spoke w/Pt wife regarding prednisone script. Had to have MD signature and script has been faxed to pharmacy on record. Awaiting fax confirmation. Spouse stated understanding.

## 2023-11-26 DIAGNOSIS — L578 Other skin changes due to chronic exposure to nonionizing radiation: Secondary | ICD-10-CM | POA: Diagnosis not present

## 2023-11-26 DIAGNOSIS — L821 Other seborrheic keratosis: Secondary | ICD-10-CM | POA: Diagnosis not present

## 2023-11-26 DIAGNOSIS — L570 Actinic keratosis: Secondary | ICD-10-CM | POA: Diagnosis not present

## 2023-11-26 NOTE — Telephone Encounter (Signed)
 Noted.

## 2023-11-26 NOTE — Telephone Encounter (Signed)
 Lupita Leash from [facility] in [place] called to inform pt's appointment date is for 11-28-23 at 3:00 if there are questions, Lupita Leash can be reached at [phone].  This is FYI no call back requested

## 2023-11-26 NOTE — Telephone Encounter (Signed)
 The MRI is scheduled for May 6, so I will have to get a new PA once the current one expires.

## 2023-11-28 ENCOUNTER — Ambulatory Visit (INDEPENDENT_AMBULATORY_CARE_PROVIDER_SITE_OTHER)
Admission: RE | Admit: 2023-11-28 | Discharge: 2023-11-28 | Disposition: A | Discharge status: Home / Self Care | Source: Ambulatory Visit | Attending: Neurology | Admitting: Neurology

## 2023-11-28 DIAGNOSIS — R251 Tremor, unspecified: Secondary | ICD-10-CM

## 2023-11-28 DIAGNOSIS — I700 Atherosclerosis of aorta: Secondary | ICD-10-CM | POA: Diagnosis not present

## 2023-11-28 DIAGNOSIS — I6501 Occlusion and stenosis of right vertebral artery: Secondary | ICD-10-CM | POA: Diagnosis not present

## 2023-11-28 DIAGNOSIS — R55 Syncope and collapse: Secondary | ICD-10-CM | POA: Diagnosis not present

## 2023-11-28 DIAGNOSIS — I6523 Occlusion and stenosis of bilateral carotid arteries: Secondary | ICD-10-CM | POA: Diagnosis not present

## 2023-11-28 MED ORDER — IOHEXOL 350 MG/ML SOLN
100.0000 mL | Freq: Once | INTRAVENOUS | Status: AC | PRN
  Administered 2023-11-28: 75 mL via INTRAVENOUS

## 2023-11-28 NOTE — Progress Notes (Signed)
 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  This can be managed medically for now but if he has recurrent symptoms may consider options for revascularization

## 2023-11-28 NOTE — Progress Notes (Signed)
Kindly inform the patient that EEG of brainwave study was normal.

## 2023-12-03 ENCOUNTER — Telehealth: Payer: Self-pay

## 2023-12-03 NOTE — Telephone Encounter (Signed)
 Spoke w/Pt regarding the medical interventions of taking medications for cholesterol control - rosuvastatin, also Eliquis, Jardiance, metoprolol succinate and midodrine is how his condition is medically managed. As discussed in the [facility] message regarding results as Dr. Pearlean Brownie stated, if the procedure is needed there will be a referral to IR or Cardiology but the angiogram showed moderate narrowing and therefore not an immediate issue. If symptoms continue or worsen then may need the referral. Pt stated understanding and thankful for the call.

## 2023-12-05 ENCOUNTER — Other Ambulatory Visit: Payer: Self-pay | Admitting: *Deleted

## 2023-12-05 ENCOUNTER — Telehealth: Payer: Self-pay | Admitting: *Deleted

## 2023-12-05 MED ORDER — METOPROLOL SUCCINATE ER 25 MG PO TB24: 25.0000 mg | ORAL_TABLET | Freq: Every day | ORAL | 3 refills | Status: AC

## 2023-12-05 NOTE — Telephone Encounter (Signed)
 Spoke with Patient and Wife  who are aware to cut Metoprolol from 50 mg to 25 mg Per Dr Dulce Sellar also. New Rx sent into pharmacy

## 2023-12-05 NOTE — Telephone Encounter (Signed)
-----   Message from Sheilah Pigeon sent at 11/15/2023 11:55 AM EDT ----- Please see my addendum and update the patient/wife, meds list.  I spoke to them at the visit about reducing his metoprolol > Dr. Dulce Sellar was in agreement.   Please reduce his metoprolol to 25mg  daily and advise to take in the evenings, perhaps aroud dinner time or so

## 2023-12-25 NOTE — Telephone Encounter (Signed)
 [employer] medicare Siegfried Dress: [id number] exp. 12/25/23-01/23/24

## 2023-12-31 ENCOUNTER — Ambulatory Visit (HOSPITAL_COMMUNITY)
Admission: RE | Admit: 2023-12-31 | Discharge: 2023-12-31 | Disposition: A | Discharge status: Home / Self Care | Source: Ambulatory Visit | Attending: Neurology | Admitting: Neurology

## 2023-12-31 DIAGNOSIS — R251 Tremor, unspecified: Secondary | ICD-10-CM | POA: Diagnosis not present

## 2024-01-06 ENCOUNTER — Ambulatory Visit

## 2024-01-06 ENCOUNTER — Ambulatory Visit: Payer: Medicare Other | Admitting: Cardiology

## 2024-01-06 VITALS — BP 120/64 | HR 80 | Ht 66.0 in | Wt 137.4 lb

## 2024-01-06 DIAGNOSIS — Z951 Presence of aortocoronary bypass graft: Secondary | ICD-10-CM

## 2024-01-06 DIAGNOSIS — I480 Paroxysmal atrial fibrillation: Secondary | ICD-10-CM | POA: Diagnosis not present

## 2024-01-06 DIAGNOSIS — I951 Orthostatic hypotension: Secondary | ICD-10-CM

## 2024-01-06 DIAGNOSIS — Z950 Presence of cardiac pacemaker: Secondary | ICD-10-CM

## 2024-01-06 DIAGNOSIS — Z952 Presence of prosthetic heart valve: Secondary | ICD-10-CM | POA: Diagnosis not present

## 2024-01-06 HISTORY — DX: Orthostatic hypotension: I95.1

## 2024-01-06 HISTORY — DX: Presence of cardiac pacemaker: Z95.0

## 2024-01-06 NOTE — Assessment & Plan Note (Signed)
 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 Continues to follow-up with electrophysiology and device clinic.  Well-functioning device on last check March 2025.

## 2024-01-06 NOTE — Patient Instructions (Signed)
 Medication Instructions:  Your physician recommends that you continue on your current medications as directed. Please refer to the Current Medication list given to you today.  *If you need a refill on your cardiac medications before your next appointment, please call your pharmacy*   Lab Work: None ordered If you have labs (blood work) drawn today and your tests are completely normal, you will receive your results only by: MyChart Message (if you have MyChart) OR A paper copy in the mail If you have any lab test that is abnormal or we need to change your treatment, we will call you to review the results.   Testing/Procedures: Your physician has requested that you have an echocardiogram. Echocardiography is a painless test that uses sound waves to create images of your heart. It provides your doctor with information about the size and shape of your heart and how well your heart's chambers and valves are working. This procedure takes approximately one hour. There are no restrictions for this procedure. Please do NOT wear cologne, perfume, aftershave, or lotions (deodorant is allowed). Please arrive 15 minutes prior to your appointment time.     Follow-Up: At [facility], you and your health needs are our priority.  As part of our continuing mission to provide you with exceptional heart care, we have created designated Provider Care Teams.  These Care Teams include your primary Cardiologist (physician) and Advanced Practice Providers (APPs -  Physician Assistants and Nurse Practitioners) who all work together to provide you with the care you need, when you need it.  We recommend signing up for the patient portal called "MyChart".  Sign up information is provided on this After Visit Summary.  MyChart is used to connect with patients for Virtual Visits (Telemedicine).  Patients are able to view lab/test results, encounter notes, upcoming appointments, etc.  Non-urgent messages can be sent to  your provider as well.   To learn more about what you can do with MyChart, go to [email or web address].    Your next appointment:   8 month(s)  The format for your next appointment:   In Person  Provider:   Tereasa Felty Madireddy, MD   Other Instructions Echocardiogram An echocardiogram is a test that uses sound waves (ultrasound) to produce images of the heart. Images from an echocardiogram can provide important information about: Heart size and shape. The size and thickness and movement of your heart's walls. Heart muscle function and strength. Heart valve function or if you have stenosis. Stenosis is when the heart valves are too narrow. If blood is flowing backward through the heart valves (regurgitation). A tumor or infectious growth around the heart valves. Areas of heart muscle that are not working well because of poor blood flow or injury from a heart attack. Aneurysm detection. An aneurysm is a weak or damaged part of an artery wall. The wall bulges out from the normal force of blood pumping through the body. Tell a health care provider about: Any allergies you have. All medicines you are taking, including vitamins, herbs, eye drops, creams, and over-the-counter medicines. Any blood disorders you have. Any surgeries you have had. Any medical conditions you have. Whether you are pregnant or may be pregnant. What are the risks? Generally, this is a safe test. However, problems may occur, including an allergic reaction to dye (contrast) that may be used during the test. What happens before the test? No specific preparation is needed. You may eat and drink normally. What happens during the test? You will  take off your clothes from the waist up and put on a hospital gown. Electrodes or electrocardiogram (ECG)patches may be placed on your chest. The electrodes or patches are then connected to a device that monitors your heart rate and rhythm. You will lie down on a table for  an ultrasound exam. A gel will be applied to your chest to help sound waves pass through your skin. A handheld device, called a transducer, will be pressed against your chest and moved over your heart. The transducer produces sound waves that travel to your heart and bounce back (or "echo" back) to the transducer. These sound waves will be captured in real-time and changed into images of your heart that can be viewed on a video monitor. The images will be recorded on a computer and reviewed by your health care provider. You may be asked to change positions or hold your breath for a short time. This makes it easier to get different views or better views of your heart. In some cases, you may receive contrast through an IV in one of your veins. This can improve the quality of the pictures from your heart. The procedure may vary among health care providers and hospitals.   What can I expect after the test? You may return to your normal, everyday life, including diet, activities, and medicines, unless your health care provider tells you not to do that. Follow these instructions at home: It is up to you to get the results of your test. Ask your health care provider, or the department that is doing the test, when your results will be ready. Keep all follow-up visits. This is important. Summary An echocardiogram is a test that uses sound waves (ultrasound) to produce images of the heart. Images from an echocardiogram can provide important information about the size and shape of your heart, heart muscle function, heart valve function, and other possible heart problems. You do not need to do anything to prepare before this test. You may eat and drink normally. After the echocardiogram is completed, you may return to your normal, everyday life, unless your health care provider tells you not to do that. This information is not intended to replace advice given to you by your health care provider. Make sure you  discuss any questions you have with your health care provider. Document Revised: 04/05/2020 Document Reviewed: 04/05/2020 Elsevier Patient Education  2021 Elsevier Inc.   Important Information About Sugar

## 2024-01-06 NOTE — Assessment & Plan Note (Addendum)
 No significant A-fib burden on device check recently. Elevated chads 2 VASc score. Continue with Eliquis  5 mg twice daily.  [Creatinine 0.95, GFR greater than 60 on blood work March 13; weight 62.3 kg today].

## 2024-01-06 NOTE — Assessment & Plan Note (Signed)
 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 Improved after medication changes and currently remains on midodrine  5 mg 3 times daily and low-dose metoprolol  succinate 25 mg once daily. No further episodes.  Tolerating well. Advised to continue keeping himself well-hydrated, continue with salt restriction below 2 g/day. Advised caution with changing position and continue activities as tolerated.

## 2024-01-06 NOTE — Progress Notes (Signed)
 Cardiology Consultation:    Date:  01/06/2024   ID:  Bruce Nunez, DOB 07/15/1941, MRN [id number]  PCP:  Bruce Angles, PA  Cardiologist:  Bruce Evans Eldra Word, MD   Referring MD: Bruce Nunez, [name]   No chief complaint on file.    ASSESSMENT AND PLAN:   [name] 83 year old male history of severe aortic insufficiency and moderate multivessel coronary artery disease s/p surgical aortic valve replacement and CABG November 2024 [21 mm Inspiris pericardial valve, LIMA-LAD], complicated by stroke, atrial fibrillation, complete heart block, underwent pacemaker implant 07-17-2023, preserved LVEF on echocardiogram August 18, 2019 for 70 to 75% EF, well-seated 21 mm bioprosthetic Inspiris aortic valve with V-max 2.6 m/s, symptomatic orthostatic hypotension and low blood pressures. Also has history of diabetes mellitus, hyperlipidemia.  Overall doing well and improving with stable functional capacity at home.  Problem List Items Addressed This Visit     S/P AVR (aortic valve replacement) - Primary   Well-functioning device on last echocardiogram December 2024.  Will schedule for follow-up annual echocardiogram tentatively December 2025. Advised about antibiotic for endocarditis prophylaxis which he is aware and is adhering.      Relevant Orders   ECHOCARDIOGRAM COMPLETE   S/P CABG x 1   S/p LIMA-LAD at the time of surgical aortic valve replacement. Remains on Eliquis  for A-fib hence not on aspirin . Continue with rosuvastatin  20 mg once daily.      PAF (paroxysmal atrial fibrillation) (HCC)   No significant A-fib burden on device check recently. Elevated chads 2 VASc score. Continue with Eliquis  5 mg twice daily.  [Creatinine 0.95, GFR greater than 60 on blood work March 13; weight 62.3 kg today].      Pacemaker   S/p pacemaker implant November 2024. Continues to follow-up with electrophysiology and device clinic.  Well-functioning device on last check March 2025.        Orthostatic hypotension   Symptomatic orthostatic hypotension in the past. Improved after medication changes and currently remains on midodrine  5 mg 3 times daily and low-dose metoprolol  succinate 25 mg once daily. No further episodes.  Tolerating well. Advised to continue keeping himself well-hydrated, continue with salt restriction below 2 g/day. Advised caution with changing position and continue activities as tolerated.      Return to clinic tentatively in January 2026 after his follow-up echocardiogram in December 2025. With regards to return to driving, this will have to be based on recommendation from neurologist.  From cardiac standpoint he is okay to resume as tolerated.    History of Present Illness:    Bruce Nunez is a 83 y.o. male who is being seen today for follow-up visit. PCP is Bruce Nunez, [name]. Last visit with us  in the office was 10-04-2023 with Dr. Sandee Nunez. Also had a visit with electrophysiology clinic and seen Bruce Nunez on 11-14-2023. Neurology follows up with Dr. Janett Nunez  Here for the visit accompanied by his wife.  He has history of severe aortic insufficiency and moderate multivessel coronary artery disease s/p surgical aortic valve replacement and CABG November 2024 [21 mm Inspiris pericardial valve, LIMA-LAD], complicated by stroke, atrial fibrillation, complete heart block, underwent pacemaker implant 07-17-2023, preserved LVEF on echocardiogram August 18, 2019 for 70 to 75% EF, well-seated 21 mm bioprosthetic Inspiris aortic valve with V-max 2.6 m/s, symptomatic orthostatic hypotension and low blood pressures.  Overall doing well. Denies any significant dizziness lightheadedness or syncopal episodes. Mentions energy levels are improved. Ambulating well. Has follow-up visit with neurologist regarding  recent MRI.  Denies any palpitation, chest pain.  Denies any pedal edema. Denies any blood in urine or stools. Taking medications as prescribed. Feels  lowering the dose of metoprolol  succinate to 25 mg and titrating up of midodrine  to 5 mg 3 times daily has helped.  Pacemaker check from 11-14-2023 normal device check with a sensed V sensed, atrial fibrillation less than 1% burden last seen in December 2020 for.   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   Heart murmur    Hemorrhoids    History of kidney stones    Hx of colon cancer, stage I    Hx of nonmelanoma skin cancer    Hx of peptic ulcer    as teenager   Hx of transfusion    Hyperlipidemia    Hypertension    Inguinal hernia    PAF (paroxysmal atrial fibrillation) (HCC) 08/20/2023   PONV (postoperative nausea and vomiting)    only when had Ether as Anesthesia drug   Prostate cancer (HCC) 08/15/2012   also Hx colon cancer / Hx skin cancer   Right inguinal hernia 05/08/2016   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   S/P right inguinal hernia repair Sept 2015 05/17/2014   Recurrent right indirect  Hernia treated with robotic TAPP repair with enterolysis Sept 2017   Stroke [facility]) 03/28/2022   Syncope 08/17/2023    Past Surgical History:  Procedure Laterality Date   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   Procedure: AORTIC VALVE REPLACEMENT WITH INSPIRIS AORTIC VALVE;  Surgeon: Melene Sportsman, MD;  Location: [facility] OR;  Service: Open Heart Surgery;  Laterality: N/A;   cataract surgery  2009   CORONARY ARTERY BYPASS GRAFT N/A  07/05/2023   Procedure: CORONARY ARTERY BYPASS GRAFTING, USING LEFT INTERNAL MAMMARY ARTERY;  Surgeon: Melene Sportsman, MD;  Location: [facility] OR;  Service: Open Heart Surgery;  Laterality: N/A;   CYSTOSCOPY     INGUINAL HERNIA REPAIR Right 05/17/2014   Procedure: RIGHT INGUINAL HERNIA REPAIR ;  Surgeon: Veronia Goon, MD;  Location: [facility] ORS;  Service: General;  Laterality: Right;  With MESH   KNEE SURGERY Right 2012   LEFT HEART CATH AND CORONARY ANGIOGRAPHY N/A 05/03/2023   Procedure: LEFT HEART CATH AND CORONARY ANGIOGRAPHY;  Surgeon: Arnoldo Lapping, MD;  Location: [facility] INVASIVE CV LAB;  Service: Cardiovascular;  Laterality: N/A;   PACEMAKER IMPLANT N/A 07/17/2023   Procedure: PACEMAKER IMPLANT;  Surgeon: Lei Pump, MD;  Location: [facility] INVASIVE CV LAB;  Service: Cardiovascular;  Laterality: N/A;   PARTIAL COLECTOMY  2005   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   RADIOACTIVE SEED IMPLANT N/A 11/24/2012   Procedure: RADIOACTIVE SEED IMPLANT;  Surgeon: Livingston Rigg, MD;  Location: [facility];  Service: Urology;  Laterality: N/A;   71seeds implanted  no seeds found in bladder   TEE WITHOUT CARDIOVERSION N/A 07/05/2023  Procedure: TRANSESOPHAGEAL ECHOCARDIOGRAM;  Surgeon: Melene Sportsman, MD;  Location: [facility] OR;  Service: Open Heart Surgery;  Laterality: N/A;   THROAT SURGERY  1940's   as child (age 73)growth that created fissure/corrective surgery   TONSILLECTOMY     age 70   XI ROBOTIC ASSISTED INGUINAL HERNIA REPAIR WITH MESH Right 05/08/2016   Procedure: XI ROBOTIC ASSISTED REPAIR OF RECURRENT RIGHT INGUINAL HERNIA;  Surgeon: Jacolyn Matar, MD;  Location: [facility] ORS;  Service: General;  Laterality: Right;  With MESH    Current Medications: Current Meds  Medication Sig   acetaminophen  (TYLENOL ) 325 MG tablet Take 1-2 tablets (325-650 mg total) by mouth every 6 (six) hours as needed for mild pain (pain score 1-3).   albuterol  (PROVENTIL  HFA;VENTOLIN  HFA) 108 (90 BASE) MCG/ACT  inhaler Inhale 2 puffs into the lungs every 6 (six) hours as needed for shortness of breath.   apixaban  (ELIQUIS ) 5 MG TABS tablet Take 1 tablet (5 mg total) by mouth 2 (two) times daily.   budesonide -formoterol  (SYMBICORT) 160-4.5 MCG/ACT inhaler Inhale 2 puffs into the lungs 2 (two) times daily.   cyanocobalamin  (,VITAMIN B-12,) 1000 MCG/ML injection Inject 1 mL into the skin every 30 (thirty) days.   empagliflozin  (JARDIANCE ) 10 MG TABS tablet Take 1 tablet (10 mg total) by mouth daily.   fexofenadine (ALLEGRA) 180 MG tablet Take 180 mg by mouth daily.   fluticasone  (FLONASE ) 50 MCG/ACT nasal spray Place 2 sprays into both nostrils 2 (two) times daily.   levothyroxine  (SYNTHROID ) 75 MCG tablet Take 75 mcg by mouth daily before breakfast.   metoprolol  succinate (TOPROL -XL) 25 MG 24 hr tablet Take 1 tablet (25 mg total) by mouth daily. Take with or immediately following a meal.   midodrine  (PROAMATINE ) 5 MG tablet Take 1 tablet (5 mg total) by mouth 3 (three) times daily with meals. Take the third dose of this medication at 3 pm   omeprazole  (PRILOSEC ) 20 MG capsule Take 20 mg by mouth in the morning.   predniSONE  (DELTASONE ) 50 MG tablet Take 1 tablet on 4/3 at 2:00 AM, Take 1 tablet on 4/3 at 8:00 AM, Take 1 tablet on 4/3 at 2:00 PM with Benadryl  50 mg.   rosuvastatin  (CRESTOR ) 20 MG tablet Take 1 tablet (20 mg total) by mouth daily.   solifenacin (VESICARE) 10 MG tablet Take 10 mg by mouth daily.   tamsulosin  (FLOMAX ) 0.4 MG CAPS capsule Take 1 capsule (0.4 mg total) by mouth daily.     Allergies:   Penicillins, Iodinated contrast media, Asa [aspirin ], Metformin and related, Monodox [doxycycline hyclate], Sulfa antibiotics, and Ultram [tramadol]   Social History   Socioeconomic History   Marital status: Married    Spouse name: Not on file   Number of children: 2   Years of education: Not on file   Highest education level: Not on file  Occupational History   Occupation: Retired     Comment: Designer, fashion/clothing  Tobacco Use   Smoking status: Never    Passive exposure: Never   Smokeless tobacco: Never  Vaping Use   Vaping status: Never Used  Substance and Sexual Activity   Alcohol use: No   Drug use: No   Sexual activity: Not on file  Other Topics Concern   Not on file  Social History Narrative   Not on file   Social Drivers of Health   Financial Resource Strain: Not on file  Food Insecurity: No Food Insecurity (08/17/2023)   Hunger Vital Sign  Worried About Programme researcher, broadcasting/film/video in the Last Year: Never true    Ran Out of Food in the Last Year: Never true  Transportation Needs: No Transportation Needs (08/17/2023)   PRAPARE - Administrator, Civil Service (Medical): No    Lack of Transportation (Non-Medical): No  Physical Activity: Not on file  Stress: Not on file  Social Connections: Not on file     Family History: The patient's family history includes Brain cancer in an other family member; Coronary artery disease in an other family member; Heart attack in an other family member; Prostate cancer in an other family member. ROS:   Please see the history of present illness.    All 14 point review of systems negative except as described per history of present illness.  EKGs/Labs/Other Studies Reviewed:    The following studies were reviewed today:   EKG:       Recent Labs: 07/29/2023: NT-Pro BNP 917 08/18/2023: Magnesium  1.9 11/07/2023: ALT 13; BUN 23; Creatinine, Ser 0.95; Hemoglobin 10.1; Platelets 216; Potassium 4.1; Sodium 133 11/13/2023: TSH 2.000  Recent Lipid Panel    Component Value Date/Time   CHOL 74 07/11/2023 0437   CHOL 95 (L) 03/21/2023 1059   TRIG 149 07/11/2023 0437   HDL 11 (L) 07/11/2023 0437   HDL 32 (L) 03/21/2023 1059   CHOLHDL 6.7 07/11/2023 0437   VLDL 30 07/11/2023 0437   LDLCALC 33 07/11/2023 0437   LDLCALC 39 03/21/2023 1059    Physical Exam:    VS:  BP 120/64   Pulse 80   Ht 5\' 6"  (1.676 m)   Wt 137 lb 6.4  oz (62.3 kg)   SpO2 99%   BMI 22.18 kg/m     Wt Readings from Last 3 Encounters:  01/06/24 137 lb 6.4 oz (62.3 kg)  11/14/23 134 lb 9.6 oz (61.1 kg)  11/13/23 133 lb (60.3 kg)     GENERAL:  Well nourished, well developed in no acute distress NECK: No JVD; No carotid bruits CARDIAC: Well-healed sternotomy scar and left infraclavicular pacemaker pocket scar.  RRR, S1 and S2 present, no murmurs, no rubs, no gallops CHEST:  Clear to auscultation without rales, wheezing or rhonchi  Extremities: No pitting pedal edema. Pulses bilaterally symmetric with radial 2+ and dorsalis pedis 2+ NEUROLOGIC:  Alert and oriented x 3  Medication Adjustments/Labs and Tests Ordered: Current medicines are reviewed at length with the patient today.  Concerns regarding medicines are outlined above.  Orders Placed This Encounter  Procedures   ECHOCARDIOGRAM COMPLETE   No orders of the defined types were placed in this encounter.   Signed, Aleila Syverson reddy Roselie Cirigliano, MD, MPH, [facility]. 01/06/2024 12:35 PM    [facility]

## 2024-01-06 NOTE — Assessment & Plan Note (Signed)
 S/p LIMA-LAD at the time of surgical aortic valve replacement. Remains on Eliquis  for A-fib hence not on aspirin . Continue with rosuvastatin  20 mg once daily.

## 2024-01-06 NOTE — Assessment & Plan Note (Addendum)
 Well-functioning device on last echocardiogram December 2024.  Will schedule for follow-up annual echocardiogram tentatively December 2025. Advised about antibiotic for endocarditis prophylaxis which he is aware and is adhering.

## 2024-01-07 ENCOUNTER — Other Ambulatory Visit: Payer: Self-pay | Admitting: Cardiology

## 2024-01-07 DIAGNOSIS — Z6821 Body mass index (BMI) 21.0-21.9, adult: Secondary | ICD-10-CM | POA: Diagnosis not present

## 2024-01-07 DIAGNOSIS — Z951 Presence of aortocoronary bypass graft: Secondary | ICD-10-CM | POA: Diagnosis not present

## 2024-01-07 DIAGNOSIS — I351 Nonrheumatic aortic (valve) insufficiency: Secondary | ICD-10-CM | POA: Diagnosis not present

## 2024-01-07 DIAGNOSIS — E034 Atrophy of thyroid (acquired): Secondary | ICD-10-CM | POA: Diagnosis not present

## 2024-01-07 DIAGNOSIS — I2510 Atherosclerotic heart disease of native coronary artery without angina pectoris: Secondary | ICD-10-CM | POA: Diagnosis not present

## 2024-01-07 DIAGNOSIS — I119 Hypertensive heart disease without heart failure: Secondary | ICD-10-CM | POA: Diagnosis not present

## 2024-01-07 DIAGNOSIS — E1139 Type 2 diabetes mellitus with other diabetic ophthalmic complication: Secondary | ICD-10-CM | POA: Diagnosis not present

## 2024-01-07 DIAGNOSIS — I209 Angina pectoris, unspecified: Secondary | ICD-10-CM | POA: Diagnosis not present

## 2024-01-07 DIAGNOSIS — E782 Mixed hyperlipidemia: Secondary | ICD-10-CM | POA: Diagnosis not present

## 2024-01-17 ENCOUNTER — Ambulatory Visit (INDEPENDENT_AMBULATORY_CARE_PROVIDER_SITE_OTHER): Payer: Medicare Other

## 2024-01-17 DIAGNOSIS — I480 Paroxysmal atrial fibrillation: Secondary | ICD-10-CM

## 2024-01-17 LAB — CUP PACEART REMOTE DEVICE CHECK
Battery Remaining Longevity: 127 mo
Battery Remaining Percentage: 95.5 %
Battery Voltage: 3.01 V
Brady Statistic AP VP Percent: 1 %
Brady Statistic AP VS Percent: 1 %
Brady Statistic AS VP Percent: 1 %
Brady Statistic AS VS Percent: 99 %
Brady Statistic RA Percent Paced: 1 %
Brady Statistic RV Percent Paced: 1 %
Date Time Interrogation Session: 20250523023649
Implantable Lead Connection Status: 753985
Implantable Lead Connection Status: 753985
Implantable Lead Implant Date: 20241120
Implantable Lead Implant Date: 20241120
Implantable Lead Location: 753859
Implantable Lead Location: 753860
Implantable Pulse Generator Implant Date: 20241120
Lead Channel Impedance Value: 410 Ohm
Lead Channel Impedance Value: 530 Ohm
Lead Channel Pacing Threshold Amplitude: 0.75 V
Lead Channel Pacing Threshold Amplitude: 1.125 V
Lead Channel Pacing Threshold Pulse Width: 0.5 ms
Lead Channel Pacing Threshold Pulse Width: 0.5 ms
Lead Channel Sensing Intrinsic Amplitude: 5 mV
Lead Channel Sensing Intrinsic Amplitude: 5.6 mV
Lead Channel Setting Pacing Amplitude: 1 V
Lead Channel Setting Pacing Amplitude: 2.125
Lead Channel Setting Pacing Pulse Width: 0.5 ms
Lead Channel Setting Sensing Sensitivity: 0.5 mV
Pulse Gen Model: 2272
Pulse Gen Serial Number: 8213495

## 2024-01-21 DIAGNOSIS — D6869 Other thrombophilia: Secondary | ICD-10-CM | POA: Diagnosis not present

## 2024-01-21 DIAGNOSIS — K219 Gastro-esophageal reflux disease without esophagitis: Secondary | ICD-10-CM | POA: Diagnosis not present

## 2024-01-22 ENCOUNTER — Ambulatory Visit: Payer: Self-pay | Admitting: Cardiology

## 2024-01-27 ENCOUNTER — Encounter: Payer: Self-pay | Admitting: Adult Health

## 2024-01-27 ENCOUNTER — Ambulatory Visit: Admitting: Adult Health

## 2024-01-27 VITALS — BP 142/62 | HR 72 | Ht 66.0 in | Wt 138.4 lb

## 2024-01-27 DIAGNOSIS — R251 Tremor, unspecified: Secondary | ICD-10-CM

## 2024-01-27 DIAGNOSIS — I6522 Occlusion and stenosis of left carotid artery: Secondary | ICD-10-CM

## 2024-01-27 DIAGNOSIS — Z8673 Personal history of transient ischemic attack (TIA), and cerebral infarction without residual deficits: Secondary | ICD-10-CM

## 2024-01-27 DIAGNOSIS — G3184 Mild cognitive impairment, so stated: Secondary | ICD-10-CM

## 2024-01-27 NOTE — Patient Instructions (Signed)
 It was great to you meet you today!   No changes today as everything has been overall stable  Continue to follow with cardiology as advised - I will request they continue to monitor your carotid stenosis for surveillance monitoring   We will follow up with the results from your cervical imaging, if you do not hear from our office in the next week about your results, please let us  know!      You can follow up as needed at this time but please call with any questions or concerns in the future!       Thank you for coming to see us  at [facility]. I hope we have been able to provide you high quality care today.  You may receive a patient satisfaction survey over the next few weeks. We would appreciate your feedback and comments so that we may continue to improve ourselves and the health of our patients.

## 2024-01-27 NOTE — Progress Notes (Addendum)
 [facility] [address] [place]. [place] [address] [phone]       OFFICE FOLLOW UP VISIT OTE  Bruce Nunez Date of Birth:  1940-12-30 Medical Record Number:  [id number]    Primary neurologist: Dr. Janett Medin Reason for visit: Strokes  Chief Complaint  Patient presents with   Follow-up    RM 3, Pt here w/wife for f/u. Pt and wife asking about MRI results. Do not see them in the results basket.      HPI:  Update 01/27/2024 JM: Patient returns for follow-up visit after prior visit with Dr. Janett Medin less than 3 months ago.  He is accompanied by his wife. Reports doing better overall since prior visit. Memory has remained about the same, still have some short term memory difficulties and difficulty remembering dates, denies any significant worsening.  No additional "spells" since decreasing metoprolol  dosage and adding midodrine  by cardiology for orthostatic hypotension, last episode about 2 months ago. He feels his energy levels have improved and able to tolerate more physical exertion. Wife notes some gait impairment and occasional word finding difficulty which has been present since his prior stroke in November.  No new stroke/TIA symptoms.  Remains on Eliquis  and Crestor  without side effects.  Routinely follows with PCP and cardiology.   History provided for reference purposes only Update 11/13/2023 Dr. Janett Medin: He returns for follow-up after last visit with me 8 months ago.  He is accompanied by his wife.  At last visit I will order an MRI scan of the brain which was done on 05/08/2023 which showed a small left periventricular lacunar acute infarct and old left basal ganglia infarct and changes of small vessel disease.  MR angiogram study of the brain and neck blood vessels showed no significant large vessel stenosis or occlusion.  Patient subsequently had elective CABG surgery on 07/05/2023 and woke up with confusion and sleepiness after surgery.  MRI scan on 07/10/2023  showed punctate areas of acute infarcts involving left temporal occipital junction, right parietal subcortical white matter and right superior frontal gyrus.  Patient states that since then he has noticed worsening cognitive difficulties.  Short-term memory is poor.  He has trouble at times finding words and occasionally stops midsentence.  Of late wife has also noticed some delusions and hallucinations.  He is quite disoriented easily and gets confused easily.  Patient does have family history of dementia in mother and aunt.  The last few months patient has been having some episodes which she describes as spells.  These come on suddenly.  He has a feeling they do not come on.  He feels able to grab onto objects and tries to sit down he may stop it.  In this episode he started shaking all over and becomes quite weak and has to sit down.  He is unable to get up and needs 1 or 2 person assist.  He episodes drain him and he feels tired and sleepy for couple of days.  Last episode was a few days ago and he states he is still not fully recovered.  He is fully awake during these episodes and they have been occurring at a variable frequency once or twice a week.  There are no obvious triggers for these episodes.  He denies any loss of consciousness, tongue bite, tonic-clonic activity.  There is no prior history of seizures.  He does complain of some posterior neck pain and muscle tightness but denies any recent fall or neck injury.  He denies radicular pain on numbness.  On cognitive testing today he scored 25/30 on Mini-Mental which is only slightly lower than 27/30 at last visit in July 2024.  Initial; Visit 03/21/2023 Dr. Janett Medin: Bruce Nunez is a pleasant 83 year old Caucasian male seen today for initial office consultation visit for stroke.  History is obtained from the patient and review of referral notes.  I personally reviewed available records and electronic medical records and imaging films in PACS.  He has past  medical history of diabetes, hypertension, hyperlipidemia, gastroesophageal reflux disease.  Patient states he has had 2 episodes of possible strokes.  The first 1 was on February 08, 2022 he had driven to [facility], [place] for a  for religious conference.  Is reached that he has noticed that he was mainly leaning to 1 side.  He pulled his car over to [employer] parking lot.  As he got up he noticed he was drifting to the right.  His right leg felt heavy and was twisting when he was walking.  He was able to hold onto the side of the car.  The symptoms lasted for about an hour or so.  Eventually improved and he was able to drive to his motel.  He skipped the conference.  He eventually got medical help only after he came back to [place] a few days later.  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  Patient states he did quite well in the interim.  On 02/14/2023 he had a somewhat similar episode while he was driving he felt that his car kept on being pulled to the right and he had to correct it.  After doing this for 3-4 times he eventually pulled over and he checked his tire when he got home but it was not flat.  He again noticed that his right leg was weak and acting funny could not tested.  He denied any fall, injury, back pain, radicular pain.  Patient also admitted to his having some memory difficulties and confusion during these episodes.  He had a repeat CT scan of the head on 02/27/2023 which again showed mild changes of atrophy and small vessel disease.  No acute abnormality.  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  He has not had any recent lab work for lipid profile and hemoglobin A1c checked.  He is allergic to aspirin  and develops hives.  He is currently not on any antiplatelet agents.  He  is independent in activities of daily living.  He lives with his wife.  He does admit to mild short-term memory difficulties particularly for recent events he finds it difficult to remember.  He denies any known prior history of strokes, seizures, TIA or significant neurological's.       ROS:   14 system review of systems is positive for difficulty walking, imbalance, leg weakness, memory loss, vision difficulty, leg pain all other systems negative  PMH:  Past Medical History:  Diagnosis Date   Anemia    recent iron supplement   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   Aortic regurgitation 03/12/2017   Aortic stenosis 04/29/2019   Asthma    childhood-   2013 bronchitis w/wheezing   Complication of anesthesia    " ether made me sick" - "woke up  during colonoscopy"   Diabetes mellitus without complication (HCC)    Endotracheally intubated 07/05/2023   Essential hypertension 03/12/2017   Frequency of urination    GERD (gastroesophageal reflux disease)    occasional   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   Heart murmur    Hemorrhoids    History of kidney stones    Hx of colon cancer, stage I    Hx of nonmelanoma skin cancer    Hx of peptic ulcer    as teenager   Hx of transfusion    Hyperlipidemia    Hypertension    Inguinal hernia    PAF (paroxysmal atrial fibrillation) (HCC) 08/20/2023   PONV (postoperative nausea and vomiting)    only when had Ether as Anesthesia drug   Prostate cancer (HCC) 08/15/2012   also Hx colon cancer / Hx skin cancer   Right inguinal hernia 05/08/2016   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   S/P right inguinal hernia repair Sept 2015 05/17/2014   Recurrent right indirect  Hernia treated with robotic TAPP repair with enterolysis Sept 2017   Stroke [facility]) 03/28/2022   Syncope 08/17/2023    Social History:  Social History   Socioeconomic History   Marital status: Married    Spouse name: Not on file    Number of children: 2   Years of education: Not on file   Highest education level: Not on file  Occupational History   Occupation: Retired    Comment: Designer, fashion/clothing  Tobacco Use   Smoking status: Never    Passive exposure: Never   Smokeless tobacco: Never  Vaping Use   Vaping status: Never Used  Substance and Sexual Activity   Alcohol use: No   Drug use: No   Sexual activity: Not on file  Other Topics Concern   Not on file  Social History Narrative   Not on file   Social Drivers of Health   Financial Resource Strain: Not on file  Food Insecurity: No Food Insecurity (08/17/2023)   Hunger Vital Sign    Worried About Running Out of Food in the Last Year: Never true    Ran Out of Food in the Last Year: Never true  Transportation Needs: No Transportation Needs (08/17/2023)   PRAPARE - Administrator, Civil Service (Medical): No    Lack of Transportation (Non-Medical): No  Physical Activity: Not on file  Stress: Not on file  Social Connections: Not on file  Intimate Partner Violence: Not At Risk (08/17/2023)   Humiliation, Afraid, Rape, and Kick questionnaire    Fear of Current or Ex-Partner: No    Emotionally Abused: No    Physically Abused: No    Sexually Abused: No    Medications:   Current Outpatient Medications on File Prior to Visit  Medication Sig Dispense Refill   acetaminophen  (TYLENOL ) 325 MG tablet Take 1-2 tablets (325-650 mg total) by mouth every 6 (six) hours as needed for mild pain (pain score 1-3).     albuterol  (PROVENTIL  HFA;VENTOLIN  HFA) 108 (90 BASE) MCG/ACT inhaler Inhale 2 puffs into the lungs every 6 (six) hours as needed for shortness of breath.     apixaban  (ELIQUIS ) 5 MG TABS tablet Take 1 tablet (5 mg total) by mouth 2 (two) times daily.     budesonide -formoterol  (SYMBICORT) 160-4.5 MCG/ACT inhaler Inhale 2 puffs into the lungs 2 (two) times daily.     cyanocobalamin  (,VITAMIN B-12,) 1000 MCG/ML injection  Inject 1 mL into the skin every 30  (thirty) days.     empagliflozin  (JARDIANCE ) 10 MG TABS tablet Take 1 tablet (10 mg total) by mouth daily. 90 tablet 2   fexofenadine (ALLEGRA) 180 MG tablet Take 180 mg by mouth daily.     fluticasone  (FLONASE ) 50 MCG/ACT nasal spray Place 2 sprays into both nostrils 2 (two) times daily.     levothyroxine  (SYNTHROID ) 75 MCG tablet Take 75 mcg by mouth daily before breakfast.     metoprolol  succinate (TOPROL -XL) 25 MG 24 hr tablet Take 1 tablet (25 mg total) by mouth daily. Take with or immediately following a meal. 90 tablet 3   midodrine  (PROAMATINE ) 5 MG tablet Take 1 tablet (5 mg total) by mouth 3 (three) times daily with meals. Take the third dose of this medication at 3 pm 270 tablet 2   omeprazole  (PRILOSEC ) 20 MG capsule Take 20 mg by mouth in the morning.     predniSONE  (DELTASONE ) 50 MG tablet Take 1 tablet on 4/3 at 2:00 AM, Take 1 tablet on 4/3 at 8:00 AM, Take 1 tablet on 4/3 at 2:00 PM with Benadryl  50 mg. 3 tablet 0   rosuvastatin  (CRESTOR ) 20 MG tablet Take 1 tablet (20 mg total) by mouth daily. 90 tablet 3   solifenacin (VESICARE) 10 MG tablet Take 10 mg by mouth daily.     tamsulosin  (FLOMAX ) 0.4 MG CAPS capsule Take 1 capsule (0.4 mg total) by mouth daily. 90 capsule 3   No current facility-administered medications on file prior to visit.    Allergies:   Allergies  Allergen Reactions   Penicillins Anaphylaxis, Shortness Of Breath, Itching and Swelling    Throat swells up but no intubation per wife   Iodinated Contrast Media Hives and Rash   Asa [Aspirin ] Other (See Comments)    Hx of GI bleeds   Metformin And Related Diarrhea   Monodox [Doxycycline Hyclate] Nausea Only   Sulfa Antibiotics Itching and Swelling   Ultram [Tramadol] Nausea And Vomiting    Physical Exam General: well developed, well nourished pleasant elderly Caucasian male, seated, in no evident distress Head: head normocephalic and atraumatic.   Neck: supple with no carotid or supraclavicular  bruits Cardiovascular: regular rate and rhythm, harsh ejection systolic murmur heard throughout precordium and in both neck Musculoskeletal: no deformity.  Mild posterior neck muscle spasm and slight restriction of movements. Skin:  no rash/petichiae Vascular:  Normal pulses all extremities  Neurologic Exam Mental Status: Awake and fully alert. Oriented to place and time. Recent and remote memory intact. Attention span, concentration and fund of knowledge appropriate. Mood and affect appropriate.  Diminished recall 2/3.  Able to name 13 animals which can walk on 4 legs.  Clock drawing 4/4. Cranial Nerves: Fundoscopic exam reveals sharp disc margins. Pupils equal, briskly reactive to light. Extraocular movements full without nystagmus. Visual fields full to confrontation. Hearing intact. Facial sensation intact. Face, tongue, palate moves normally and symmetrically.  Motor: Normal bulk and tone. Normal strength in all tested extremity muscles. Sensory.: intact to touch , pinprick , position and vibratory sensation.  Coordination: Rapid alternating movements normal in all extremities. Finger-to-nose and heel-to-shin performed accurately bilaterally. Gait and Station: Arises from chair without difficulty. Stance is normal. Gait demonstrates normal stride length and balance . Able to heel, toe and tandem walk with great difficulty.  Reflexes: 2+ and symmetric. Toes downgoing.   NIHSS  0 Modified Rankin  1     01/27/2024  11:09 AM 11/13/2023    2:20 PM 03/21/2023   10:14 AM  MMSE - Mini Mental State Exam  Orientation to time 5 5 5   Orientation to Place 3 5 5   Registration 3 3 3   Attention/ Calculation 4 4 4   Recall 2 1 3   Language- name 2 objects 2 2 2   Language- repeat 1 0 1  Language- follow 3 step command 3 3 2   Language- read & follow direction 1 1 1   Write a sentence 1 1 1   Copy design 0 0 0  Total score 25 25 27        ASSESSMENT: 83 year old Caucasian male with 2 episodes of  transient right leg weakness and some memory difficulties possibly left hemispheric subcortical TIAs.  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  Vascular risk factors of hypertension, hyperlipidemia and age. 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  New complaints of intermittent episodes of sudden onset of extremity shaking and generalized weakness without loss of consciousness of unclear etiology.  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    PLAN:    Hx of stroke - Continue Eliquis  and Crestor  for secondary stroke prevention manner/prescribed by PCP/cardiology - Continue close PCP/cardiology follow-up for aggressive stroke risk factor management  MCI -overall stable since prior visit -MMSE 25/30 (priro 25/30) -Discussed importance of routine physical and cognitive exercises as well as ensuring good sleep, healthy diet and management of vascular risk factors -Dementia panel within normal limits  Transient episodes -Likely more in setting of orthostatic hypotension as this has now improved after adjustment of beta-blocker dosage and initiating midodrine  -Continue to follow closely with cardiology -EEG 10/2023 normal -CTA head/neck 11/2023 left ICA 60 to 70% stenosis - referral placed to [facility] for ongoing monitoring/management  -MRI C-spine completed 12/31/2023 but has not yet been resulted - will reach out to [facility] in [place] requesting this be read/resulted     No further recommendations from neurological standpoint and is being closely followed by PCP and cardiology.  He can follow-up on an as-needed basis at this time but advised to call with any questions or concerns in the future     I personally spent a total  of 40 minutes in the care of the patient today including preparing to see the patient, getting/reviewing separately obtained history, performing a medically appropriate exam/evaluation, counseling and educating, referring and communicating with other health care professionals, documenting clinical information in the EHR, independently interpreting results, and communicating results.  Johny Nap, AGNP-BC  [facility] [address] [place], [place] [address]  Phone [phone] Fax [phone] Note: This document was prepared with digital dictation and possible smart phrase technology. Any transcriptional errors that result from this process are unintentional.

## 2024-01-28 ENCOUNTER — Encounter: Payer: Self-pay | Admitting: Adult Health

## 2024-01-28 NOTE — Progress Notes (Signed)
 Okay sounds good. I will send a referral to [facility]. Thank you for your input and getting back to me!

## 2024-01-28 NOTE — Addendum Note (Signed)
 Addended by: Johny Nap L on: 01/28/2024 08:32 AM   Modules accepted: Orders

## 2024-02-19 ENCOUNTER — Other Ambulatory Visit: Payer: Self-pay

## 2024-02-19 DIAGNOSIS — I6529 Occlusion and stenosis of unspecified carotid artery: Secondary | ICD-10-CM

## 2024-02-24 NOTE — Progress Notes (Signed)
 Remote pacemaker transmission.

## 2024-02-26 DIAGNOSIS — Z6821 Body mass index (BMI) 21.0-21.9, adult: Secondary | ICD-10-CM | POA: Diagnosis not present

## 2024-02-26 DIAGNOSIS — R410 Disorientation, unspecified: Secondary | ICD-10-CM | POA: Diagnosis not present

## 2024-02-28 ENCOUNTER — Ambulatory Visit: Payer: Self-pay | Admitting: Neurology

## 2024-03-05 ENCOUNTER — Encounter: Payer: Self-pay | Admitting: Vascular Surgery

## 2024-03-05 ENCOUNTER — Ambulatory Visit: Attending: Vascular Surgery | Admitting: Vascular Surgery

## 2024-03-05 ENCOUNTER — Ambulatory Visit (HOSPITAL_COMMUNITY)
Admission: RE | Admit: 2024-03-05 | Discharge: 2024-03-05 | Disposition: A | Discharge status: Home / Self Care | Source: Ambulatory Visit | Attending: Vascular Surgery | Admitting: Vascular Surgery

## 2024-03-05 VITALS — BP 175/83 | HR 72 | Temp 98.2°F | Resp 18 | Ht 66.0 in | Wt 138.3 lb

## 2024-03-05 DIAGNOSIS — I6529 Occlusion and stenosis of unspecified carotid artery: Secondary | ICD-10-CM | POA: Insufficient documentation

## 2024-03-05 DIAGNOSIS — I6522 Occlusion and stenosis of left carotid artery: Secondary | ICD-10-CM

## 2024-03-05 NOTE — Progress Notes (Signed)
 Office Note     CC: Symptomatic left ICA stenosis Requesting Provider:  Whitfield Raisin, NP  HPI: Bruce Nunez is a 83 y.o. ([birth year]) male presenting at the request of .[name], PA for evaluation of symptomatic left ICA stenosis  On exam, Bruce Nunez was doing well, accompanied by his wife.  Today.  His wife is from [place] .  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  Patient was seen in 2023 in 2020 for due to concern for stroke.  CT demonstrated no concerns.  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   CTA demonstrated no concern for flow-limiting stenosis in the right internal carotid artery.  Left internal carotid artery demonstrated significant stenosis, 65 to 70%.  He denied new symptoms of stroke, TIA, amaurosis.  He continues to try to live an active, independent lifestyle with his wife.    Recent surgical history includes CABG, aortic valve replacement, pacemaker, completed in November 2024 by Dr. 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   Heart murmur    Hemorrhoids    History of kidney stones    Hx of colon cancer, stage I    Hx of nonmelanoma skin cancer    Hx of peptic ulcer    as teenager   Hx of transfusion    Hyperlipidemia    Hypertension    Inguinal hernia    PAF  (paroxysmal atrial fibrillation) (HCC) 08/20/2023   PONV (postoperative nausea and vomiting)    only when had Ether as Anesthesia drug   Prostate cancer (HCC) 08/15/2012   also Hx colon cancer / Hx skin cancer   Right inguinal hernia 05/08/2016   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   S/P right inguinal hernia repair Sept 2015 05/17/2014   Recurrent right indirect  Hernia treated with robotic TAPP repair with enterolysis Sept 2017   Stroke [facility]) 03/28/2022   Syncope 08/17/2023    Past Surgical History:  Procedure Laterality Date   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   Procedure: AORTIC VALVE REPLACEMENT WITH INSPIRIS AORTIC VALVE;  Surgeon: Maryjane Mt, MD;  Location: [facility] OR;  Service: Open Heart Surgery;  Laterality: N/A;   cataract surgery  2009   CORONARY ARTERY BYPASS GRAFT N/A 07/05/2023   Procedure: CORONARY ARTERY BYPASS GRAFTING, USING LEFT INTERNAL MAMMARY ARTERY;  Surgeon: Maryjane Mt, MD;  Location: [facility] OR;  Service: Open Heart Surgery;  Laterality: N/A;   CYSTOSCOPY     INGUINAL HERNIA REPAIR Right 05/17/2014   Procedure: RIGHT INGUINAL HERNIA REPAIR ;  Surgeon: Adina Lunger, MD;  Location: [facility] ORS;  Service: General;  Laterality: Right;  With MESH   KNEE SURGERY Right 2012   LEFT HEART CATH AND CORONARY ANGIOGRAPHY N/A 05/03/2023  Procedure: LEFT HEART CATH AND CORONARY ANGIOGRAPHY;  Surgeon: Wonda Sharper, MD;  Location: [facility] INVASIVE CV LAB;  Service: Cardiovascular;  Laterality: N/A;   PACEMAKER IMPLANT N/A 07/17/2023   Procedure: PACEMAKER IMPLANT;  Surgeon: Inocencio Soyla Lunger, MD;  Location: [facility] INVASIVE CV LAB;  Service: Cardiovascular;  Laterality: N/A;   PARTIAL COLECTOMY  2005   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   RADIOACTIVE SEED IMPLANT N/A 11/24/2012   Procedure: RADIOACTIVE SEED IMPLANT;  Surgeon: Alm GORMAN Fragmin, MD;  Location: [facility];  Service: Urology;  Laterality: N/A;   71seeds  implanted  no seeds found in bladder   TEE WITHOUT CARDIOVERSION N/A 07/05/2023   Procedure: TRANSESOPHAGEAL ECHOCARDIOGRAM;  Surgeon: Maryjane Mt, MD;  Location: [facility] OR;  Service: Open Heart Surgery;  Laterality: N/A;   THROAT SURGERY  1940's   as child (age 37)growth that created fissure/corrective surgery   TONSILLECTOMY     age 78   XI ROBOTIC ASSISTED INGUINAL HERNIA REPAIR WITH MESH Right 05/08/2016   Procedure: XI ROBOTIC ASSISTED REPAIR OF RECURRENT RIGHT INGUINAL HERNIA;  Surgeon: Donnice Lunger, MD;  Location: [facility] ORS;  Service: General;  Laterality: Right;  With MESH    Social History   Socioeconomic History   Marital status: Married    Spouse name: Not on file   Number of children: 2   Years of education: Not on file   Highest education level: Not on file  Occupational History   Occupation: Retired    Comment: Designer, fashion/clothing  Tobacco Use   Smoking status: Never    Passive exposure: Never   Smokeless tobacco: Never  Vaping Use   Vaping status: Never Used  Substance and Sexual Activity   Alcohol use: No   Drug use: No   Sexual activity: Not on file  Other Topics Concern   Not on file  Social History Narrative   Not on file   Social Drivers of Health   Financial Resource Strain: Not on file  Food Insecurity: No Food Insecurity (08/17/2023)   Hunger Vital Sign    Worried About Running Out of Food in the Last Year: Never true    Ran Out of Food in the Last Year: Never true  Transportation Needs: No Transportation Needs (08/17/2023)   PRAPARE - Administrator, Civil Service (Medical): No    Lack of Transportation (Non-Medical): No  Physical Activity: Not on file  Stress: Not on file  Social Connections: Not on file  Intimate Partner Violence: Not At Risk (08/17/2023)   Humiliation, Afraid, Rape, and Kick questionnaire    Fear of Current or Ex-Partner: No    Emotionally Abused: No    Physically Abused: No    Sexually Abused: No   Family History   Problem Relation Age of Onset   Heart attack Other    Brain cancer Other    Coronary artery disease Other    Prostate cancer Other     Current Outpatient Medications  Medication Sig Dispense Refill   acetaminophen  (TYLENOL ) 325 MG tablet Take 1-2 tablets (325-650 mg total) by mouth every 6 (six) hours as needed for mild pain (pain score 1-3).     albuterol  (PROVENTIL  HFA;VENTOLIN  HFA) 108 (90 BASE) MCG/ACT inhaler Inhale 2 puffs into the lungs every 6 (six) hours as needed for shortness of breath.     apixaban  (ELIQUIS ) 5 MG TABS tablet Take 1 tablet (5 mg total) by mouth 2 (two) times daily.  budesonide -formoterol  (SYMBICORT) 160-4.5 MCG/ACT inhaler Inhale 2 puffs into the lungs 2 (two) times daily.     cyanocobalamin  (,VITAMIN B-12,) 1000 MCG/ML injection Inject 1 mL into the skin every 30 (thirty) days.     empagliflozin  (JARDIANCE ) 10 MG TABS tablet Take 1 tablet (10 mg total) by mouth daily. 90 tablet 2   fexofenadine (ALLEGRA) 180 MG tablet Take 180 mg by mouth daily.     fluticasone  (FLONASE ) 50 MCG/ACT nasal spray Place 2 sprays into both nostrils 2 (two) times daily.     levothyroxine  (SYNTHROID ) 75 MCG tablet Take 75 mcg by mouth daily before breakfast.     metoprolol  succinate (TOPROL -XL) 25 MG 24 hr tablet Take 1 tablet (25 mg total) by mouth daily. Take with or immediately following a meal. 90 tablet 3   midodrine  (PROAMATINE ) 5 MG tablet Take 1 tablet (5 mg total) by mouth 3 (three) times daily with meals. Take the third dose of this medication at 3 pm 270 tablet 2   omeprazole  (PRILOSEC ) 20 MG capsule Take 20 mg by mouth in the morning.     predniSONE  (DELTASONE ) 50 MG tablet Take 1 tablet on 4/3 at 2:00 AM, Take 1 tablet on 4/3 at 8:00 AM, Take 1 tablet on 4/3 at 2:00 PM with Benadryl  50 mg. 3 tablet 0   rosuvastatin  (CRESTOR ) 20 MG tablet Take 1 tablet (20 mg total) by mouth daily. 90 tablet 3   solifenacin (VESICARE) 10 MG tablet Take 10 mg by mouth daily.      tamsulosin  (FLOMAX ) 0.4 MG CAPS capsule Take 1 capsule (0.4 mg total) by mouth daily. 90 capsule 3   No current facility-administered medications for this visit.    Allergies  Allergen Reactions   Penicillins Anaphylaxis, Shortness Of Breath, Itching and Swelling    Throat swells up but no intubation per wife   Iodinated Contrast Media Hives and Rash   Dorethia Laundry ] Other (See Comments)    Hx of GI bleeds   Metformin And Related Diarrhea   Monodox [Doxycycline Hyclate] Nausea Only   Sulfa Antibiotics Itching and Swelling   Ultram [Tramadol] Nausea And Vomiting     REVIEW OF SYSTEMS:  [X]  denotes positive finding, [ ]  denotes negative finding Cardiac  Comments:  Chest pain or chest pressure:    Shortness of breath upon exertion:    Short of breath when lying flat:    Irregular heart rhythm:        Vascular    Pain in calf, thigh, or hip brought on by ambulation:    Pain in feet at night that wakes you up from your sleep:     Blood clot in your veins:    Leg swelling:         Pulmonary    Oxygen  at home:    Productive cough:     Wheezing:         Neurologic    Sudden weakness in arms or legs:     Sudden numbness in arms or legs:     Sudden onset of difficulty speaking or slurred speech:    Temporary loss of vision in one eye:     Problems with dizziness:         Gastrointestinal    Blood in stool:     Vomited blood:         Genitourinary    Burning when urinating:     Blood in urine:        Psychiatric  Major depression:         Hematologic    Bleeding problems:    Problems with blood clotting too easily:        Skin    Rashes or ulcers:        Constitutional    Fever or chills:      PHYSICAL EXAMINATION:  Vitals:   03/05/24 1101 03/05/24 1107  BP: (!) 157/74 (!) 175/83  Pulse: 72   Resp: 18   Temp: 98.2 F (36.8 C)   TempSrc: Temporal   SpO2: 100%   Weight: 138 lb 4.8 oz (62.7 kg)   Height: 5' 6 (1.676 m)     General:  WDWN in NAD;  vital signs documented above Gait: Not observed HENT: WNL, normocephalic Pulmonary: normal non-labored breathing , without wheezing Cardiac: regular HR Abdomen: soft, NT, no masses Skin: without rashes Vascular Exam/Pulses:  Right Left  Radial 2+ (normal) 2+ (normal)  Ulnar    Femoral    Popliteal    DP 2+ (normal) 2+ (normal)  PT     Extremities: without ischemic changes, without Gangrene , without cellulitis; without open wounds;  Musculoskeletal: no muscle wasting or atrophy  Neurologic: A&O X 3;  No focal weakness or paresthesias are detected Psychiatric:  The pt has Normal affect.   Non-Invasive Vascular Imaging:     Right Carotid Findings:  +----------+--------+--------+--------+------------------+-----------------  ----+           PSV cm/sEDV cm/sStenosisPlaque DescriptionComments                +----------+--------+--------+--------+------------------+-----------------  ----+  CCA Prox  141     12                                                        +----------+--------+--------+--------+------------------+-----------------  ----+  CCA Distal138     17                                intimal  thickening     +----------+--------+--------+--------+------------------+-----------------  ----+  ICA Prox  171     20      40-59%  heterogenous      stenosis based  on PSV                                                      and plaque  formation   +----------+--------+--------+--------+------------------+-----------------  ----+  ICA Distal167     27                                                        +----------+--------+--------+--------+------------------+-----------------  ----+  ECA      125     0               heterogenous                              +----------+--------+--------+--------+------------------+-----------------  ----+   +----------+--------+-------+----------------+-------------------+  PSV cm/sEDV cmsDescribe        Arm Pressure (mmHG)  +----------+--------+-------+----------------+-------------------+  Subclavian103    0      Multiphasic, [id number]                  +----------+--------+-------+----------------+-------------------+   +---------+--------+--+--------+-+---------+  VertebralPSV cm/s42EDV cm/s8Antegrade  +---------+--------+--+--------+-+---------+      Left Carotid Findings:  +----------+--------+--------+--------+------------------+--------+           PSV cm/sEDV cm/sStenosisPlaque DescriptionComments  +----------+--------+--------+--------+------------------+--------+  CCA Prox  79      0       <50%    heterogenous                +----------+--------+--------+--------+------------------+--------+  CCA Distal68      12                                          +----------+--------+--------+--------+------------------+--------+  ICA Prox  518     124     80-99%  heterogenous                +----------+--------+--------+--------+------------------+--------+  ICA Mid   127     0                                           +----------+--------+--------+--------+------------------+--------+  ICA Distal40      0                                           +----------+--------+--------+--------+------------------+--------+  ECA      124     0               heterogenous                +----------+--------+--------+--------+------------------+--------+   +----------+--------+--------+----------------+-------------------+           PSV cm/sEDV cm/sDescribe        Arm Pressure (mmHG)  +----------+--------+--------+----------------+-------------------+  Subclavian229    0       Multiphasic, [id number]                  +----------+--------+--------+----------------+-------------------+   +---------+--------+--+--------+--+---------+  VertebralPSV cm/s66EDV cm/s15Antegrade   +---------+--------+--+--------+--+---------+         ASSESSMENT/PLAN: Bruce Nunez is a 83 y.o. male presenting with symptomatic left-sided ICA stenosis.  I had a long conversation with Levante regarding the above, most notably that with continued medical management he has a 26% risk of stroke over the next 2 years.  With surgical intervention, this risk decreases to 9%.  I have evaluated his prior CT scan.  He is not a candidate for transcarotid artery revascularization, but is a candidate for carotid endarterectomy.  We had a long conversation today regarding the risks and benefits.  I would like for him to be seen by his cardiologist, Dr. Monetta, for preoperative risk assessment and optimization.  My plan is to see him back in the coming weeks for detailed discussion regarding carotid endarterectomy including the risks and benefits once he has seen Dr. Monetta.  We discussed signs and symptoms of stroke, TIA, amaurosis.  I asked him to call 911 should any of these occur.   Fonda FORBES Rim, MD [facility] [phone]

## 2024-03-06 DIAGNOSIS — I6529 Occlusion and stenosis of unspecified carotid artery: Secondary | ICD-10-CM | POA: Insufficient documentation

## 2024-03-09 ENCOUNTER — Ambulatory Visit

## 2024-03-09 VITALS — BP 138/68 | HR 79 | Ht 67.0 in | Wt 140.8 lb

## 2024-03-09 DIAGNOSIS — Z01810 Encounter for preprocedural cardiovascular examination: Secondary | ICD-10-CM | POA: Insufficient documentation

## 2024-03-09 DIAGNOSIS — Z951 Presence of aortocoronary bypass graft: Secondary | ICD-10-CM

## 2024-03-09 DIAGNOSIS — Z952 Presence of prosthetic heart valve: Secondary | ICD-10-CM | POA: Diagnosis not present

## 2024-03-09 DIAGNOSIS — I951 Orthostatic hypotension: Secondary | ICD-10-CM

## 2024-03-09 DIAGNOSIS — I480 Paroxysmal atrial fibrillation: Secondary | ICD-10-CM

## 2024-03-09 HISTORY — DX: Encounter for preprocedural cardiovascular examination: Z01.810

## 2024-03-09 NOTE — Assessment & Plan Note (Signed)
 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 Remains well-controlled on current dose of midodrine  3 times daily. Advised to keep himself well-hydrated and caution changing position and avoid standing up for extended durations especially in hot weather.

## 2024-03-09 NOTE — Progress Notes (Signed)
 Cardiology Consultation:    Date:  03/09/2024   ID:  LEANDRO BERKOWITZ, DOB 07/30/1941, MRN [id number]  PCP:  [name], PA  Cardiologist:  Alean SAUNDERS Wendy Hoback, MD   Referring MD: Lanis Fonda BRAVO, MD   No chief complaint on file.    ASSESSMENT AND PLAN:   Mr. Zenz 83 year old male with history of severe aortic insufficiency and moderate multivessel coronary artery disease s/p surgical aortic valve replacement and CABG November 2024 [21 mm Inspiris pericardial valve, LIMA-LAD], complicated by stroke, atrial fibrillation, complete heart block, underwent pacemaker implant 07-17-2023, preserved LVEF on echocardiogram August 18, 2019 for 70 to 75% EF, well-seated 21 mm bioprosthetic Inspiris aortic valve with V-max 2.6 m/s, symptomatic orthostatic hypotension and low blood pressures, diabetes mellitus, hyperlipidemia.  Now being evaluated by vascular surgery for possible left carotid endarterectomy, following up with Dr. Lanis.   Problem List Items Addressed This Visit     S/P AVR (aortic valve replacement)   Transthoracic echocardiogram tentatively ordered for December 2025 at last office visit.  Will review once available.      S/P CABG x 1   Given history of aortic valve replacement and s/p LIMA-LAD, should be on at least single antiplatelet agent perioperatively. Continue with rosuvastatin  20 mg once daily.      PAF (paroxysmal atrial fibrillation) (HCC)   Remains in sinus rhythm on EKG today. Elevated CHADS2 vascular score. Continue Eliquis  5 mg twice daily [on appropriate dose, weight 63 kg, creatinine 01/07/2024 0.92].  Okay to hold Eliquis  perioperatively for upcoming carotid endarterectomy. Will ideally have to be on a single antiplatelet agent. History of aspirin  allergy with significant peptic ulcer disease and GI bleeding. Consider Plavix  75 mg once daily while Eliquis  is on hold..      Orthostatic hypotension   Symptomatic orthostatic hypotension in the  past. Remains well-controlled on current dose of midodrine  3 times daily. Advised to keep himself well-hydrated and caution changing position and avoid standing up for extended durations especially in hot weather.       Preop cardiovascular exam - Primary   No to cardiac symptoms. Good functional status. Continue with left carotid endarterectomy as being planned, no further cardiac workup required at this time.  Perioperative blood pressure management aspirin  anesthetist, high risk for postural hypotension.  Okay to hold Eliquis  perioperatively for upcoming carotid endarterectomy. Will ideally have to be on a single antiplatelet agent. History of aspirin  allergy with significant peptic ulcer disease and GI bleeding. Strongly recommend to be on Plavix  75 mg once daily while Eliquis  is on hold if this is not prohibitive from surgical standpoint.  Should be on appropriate antibiotic coverage for perioperative infective endocarditis prophylaxis.      Relevant Orders   EKG 12-Lead (Completed)    Return to clinic tentatively in 6 months  History of Present Illness:    SHAMARI LOFQUIST is a 83 y.o. male who is being seen today for follow-up visit. PCP is Lanis Fonda BRAVO, MD. Last visit with us  in the office was 01/06/2024. 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 Lanis.  history of severe aortic insufficiency and moderate multivessel coronary artery disease s/p surgical aortic valve replacement and CABG November 2024 [21 mm Inspiris pericardial valve, LIMA-LAD], complicated by stroke, atrial fibrillation, complete heart block, underwent pacemaker implant 07-17-2023, preserved LVEF  on echocardiogram August 18, 2019 for 70 to 75% EF, well-seated 21 mm bioprosthetic Inspiris aortic valve with V-max  2.6 m/s, symptomatic orthostatic hypotension and low blood pressures, diabetes mellitus, hyperlipidemia.  Pleasant man here for the visit by himself. Denies any chest pain, shortness of breath, cardiac symptoms.  Occasionally does feel symptoms of lightheadedness when he has been standing up for an extended duration. Symptoms likely related to orthostatic blood pressure changes.  Couple weeks ago while out in the garden on a hot sunny day he started feeling symptoms of lightheadedness and symptoms going down the right arm and had to be helped by his grandkids into the house.  Denies any orthopnea or paroxysmal nocturnal dyspnea. No pedal edema. No palpitations. Denies any blood in urine or stools.   EKG in the clinic today shows sinus rhythm heart rate 79/min, PR interval 178 ms, QRS duration 124 ms consistent with RBBB plus LAFB.  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   Heart murmur    Hemorrhoids    History of kidney stones    Hx of colon cancer, stage I    Hx of nonmelanoma skin cancer    Hx of peptic ulcer    as teenager   Hx of transfusion    Hyperlipidemia    Hypertension    Inguinal hernia    Orthostatic hypotension 01/06/2024   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   PAF (paroxysmal atrial fibrillation) (HCC) 08/20/2023   PONV (postoperative nausea and vomiting)    only when had Ether as Anesthesia drug   Prostate cancer (HCC) 08/15/2012    also Hx colon cancer / Hx skin cancer   Right inguinal hernia 05/08/2016   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   S/P right inguinal hernia repair Sept 2015 05/17/2014   Recurrent right indirect  Hernia treated with robotic TAPP repair with enterolysis Sept 2017   Stroke [facility]) 03/28/2022   Syncope 08/17/2023    Past Surgical History:  Procedure Laterality Date   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   Procedure: AORTIC VALVE REPLACEMENT WITH INSPIRIS AORTIC VALVE;  Surgeon: Maryjane Mt, MD;  Location: [facility] OR;  Service: Open Heart Surgery;  Laterality: N/A;   cataract surgery  2009   CORONARY ARTERY BYPASS GRAFT N/A 07/05/2023   Procedure: CORONARY ARTERY BYPASS GRAFTING, USING LEFT INTERNAL MAMMARY ARTERY;  Surgeon: Maryjane Mt, MD;  Location: [facility] OR;  Service: Open Heart Surgery;  Laterality: N/A;   CYSTOSCOPY     INGUINAL HERNIA REPAIR Right 05/17/2014   Procedure: RIGHT INGUINAL HERNIA REPAIR ;  Surgeon: Adina Lunger, MD;  Location: [facility] ORS;  Service: General;  Laterality: Right;  With MESH   KNEE SURGERY Right 2012   LEFT HEART CATH AND CORONARY ANGIOGRAPHY N/A 05/03/2023   Procedure: LEFT HEART CATH AND CORONARY ANGIOGRAPHY;  Surgeon: Wonda Sharper, MD;  Location: [facility] INVASIVE CV  LAB;  Service: Cardiovascular;  Laterality: N/A;   PACEMAKER IMPLANT N/A 07/17/2023   Procedure: PACEMAKER IMPLANT;  Surgeon: Inocencio Soyla Lunger, MD;  Location: [facility] INVASIVE CV LAB;  Service: Cardiovascular;  Laterality: N/A;   PARTIAL COLECTOMY  2005   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   RADIOACTIVE SEED IMPLANT N/A 11/24/2012   Procedure: RADIOACTIVE SEED IMPLANT;  Surgeon: Alm GORMAN Fragmin, MD;  Location: [facility];  Service: Urology;  Laterality: N/A;   71seeds implanted  no seeds found in bladder   TEE WITHOUT CARDIOVERSION N/A 07/05/2023   Procedure: TRANSESOPHAGEAL ECHOCARDIOGRAM;  Surgeon: Maryjane Mt, MD;  Location: [facility] OR;  Service:  Open Heart Surgery;  Laterality: N/A;   THROAT SURGERY  1940's   as child (age 20)growth that created fissure/corrective surgery   TONSILLECTOMY     age 72   XI ROBOTIC ASSISTED INGUINAL HERNIA REPAIR WITH MESH Right 05/08/2016   Procedure: XI ROBOTIC ASSISTED REPAIR OF RECURRENT RIGHT INGUINAL HERNIA;  Surgeon: Donnice Lunger, MD;  Location: [facility] ORS;  Service: General;  Laterality: Right;  With MESH    Current Medications: Current Meds  Medication Sig   acetaminophen  (TYLENOL ) 325 MG tablet Take 1-2 tablets (325-650 mg total) by mouth every 6 (six) hours as needed for mild pain (pain score 1-3).   albuterol  (PROVENTIL  HFA;VENTOLIN  HFA) 108 (90 BASE) MCG/ACT inhaler Inhale 2 puffs into the lungs every 6 (six) hours as needed for shortness of breath.   apixaban  (ELIQUIS ) 5 MG TABS tablet Take 1 tablet (5 mg total) by mouth 2 (two) times daily.   budesonide -formoterol  (SYMBICORT) 160-4.5 MCG/ACT inhaler Inhale 2 puffs into the lungs 2 (two) times daily.   cyanocobalamin  (,VITAMIN B-12,) 1000 MCG/ML injection Inject 1 mL into the skin every 30 (thirty) days.   empagliflozin  (JARDIANCE ) 10 MG TABS tablet Take 1 tablet (10 mg total) by mouth daily.   fexofenadine (ALLEGRA) 180 MG tablet Take 180 mg by mouth daily.   fluticasone  (FLONASE ) 50 MCG/ACT nasal spray Place 2 sprays into both nostrils 2 (two) times daily.   levothyroxine  (SYNTHROID ) 75 MCG tablet Take 75 mcg by mouth daily before breakfast.   metoprolol  succinate (TOPROL -XL) 25 MG 24 hr tablet Take 1 tablet (25 mg total) by mouth daily. Take with or immediately following a meal.   midodrine  (PROAMATINE ) 5 MG tablet Take 1 tablet (5 mg total) by mouth 3 (three) times daily with meals. Take the third dose of this medication at 3 pm   omeprazole  (PRILOSEC ) 20 MG capsule Take 20 mg by mouth in the morning.   rosuvastatin  (CRESTOR ) 20 MG tablet Take 1 tablet (20 mg total) by mouth daily.   solifenacin (VESICARE) 10 MG tablet Take 10 mg by mouth  daily.   tamsulosin  (FLOMAX ) 0.4 MG CAPS capsule Take 1 capsule (0.4 mg total) by mouth daily.     Allergies:   Penicillins, Iodinated contrast media, Asa [aspirin ], Metformin and related, Monodox [doxycycline hyclate], Sulfa antibiotics, and Ultram [tramadol]   Social History   Socioeconomic History   Marital status: Married    Spouse name: Not on file   Number of children: 2   Years of education: Not on file   Highest education level: Not on file  Occupational History   Occupation: Retired    Comment: Designer, fashion/clothing  Tobacco Use   Smoking status: Never    Passive exposure: Never   Smokeless tobacco: Never  Vaping Use   Vaping status: Never Used  Substance and  Sexual Activity   Alcohol use: No   Drug use: No   Sexual activity: Not on file  Other Topics Concern   Not on file  Social History Narrative   Not on file   Social Drivers of Health   Financial Resource Strain: Not on file  Food Insecurity: No Food Insecurity (08/17/2023)   Hunger Vital Sign    Worried About Running Out of Food in the Last Year: Never true    Ran Out of Food in the Last Year: Never true  Transportation Needs: No Transportation Needs (08/17/2023)   PRAPARE - Administrator, Civil Service (Medical): No    Lack of Transportation (Non-Medical): No  Physical Activity: Not on file  Stress: Not on file  Social Connections: Not on file     Family History: The patient's family history includes Brain cancer in an other family member; Coronary artery disease in an other family member; Heart attack in an other family member; Prostate cancer in an other family member. ROS:   Please see the history of present illness.    All 14 point review of systems negative except as described per history of present illness.  EKGs/Labs/Other Studies Reviewed:    The following studies were reviewed today:   EKG:  EKG Interpretation Date/Time:  Monday March 09 2024 13:53:08 EDT Ventricular Rate:  79 PR  Interval:  178 QRS Duration:  124 QT Interval:  392 QTC Calculation: 449 R Axis:   -44  Text Interpretation: Normal sinus rhythm Left axis deviation Right bundle branch block Moderate voltage criteria for LVH, may be normal variant Inferior infarct , age undetermined Anterior infarct , age undetermined Abnormal ECG When compared with ECG of 14-Nov-2023 14:56, Anterior infarct is now Present Inferior infarct is now Present Confirmed by Liborio Hai reddy [phone]) on 03/09/2024 1:56:34 PM    Recent Labs: 07/29/2023: NT-Pro BNP 917 08/18/2023: Magnesium  1.9 11/07/2023: ALT 13; BUN 23; Creatinine, Ser 0.95; Hemoglobin 10.1; Platelets 216; Potassium 4.1; Sodium 133 11/13/2023: TSH 2.000  Recent Lipid Panel    Component Value Date/Time   CHOL 74 07/11/2023 0437   CHOL 95 (L) 03/21/2023 1059   TRIG 149 07/11/2023 0437   HDL 11 (L) 07/11/2023 0437   HDL 32 (L) 03/21/2023 1059   CHOLHDL 6.7 07/11/2023 0437   VLDL 30 07/11/2023 0437   LDLCALC 33 07/11/2023 0437   LDLCALC 39 03/21/2023 1059    Physical Exam:    VS:  BP 138/68   Pulse 79   Ht 5' 7 (1.702 m)   Wt 140 lb 12.8 oz (63.9 kg)   SpO2 96%   BMI 22.05 kg/m     Wt Readings from Last 3 Encounters:  03/09/24 140 lb 12.8 oz (63.9 kg)  03/05/24 138 lb 4.8 oz (62.7 kg)  01/27/24 138 lb 6.4 oz (62.8 kg)     GENERAL:  Well nourished, well developed in no acute distress NECK: No JVD; No carotid bruits CARDIAC: RRR, S1 and S2 present, 3/6 systolic murmur best heard right upper sternal border. CHEST:  Clear to auscultation without rales, wheezing or rhonchi  Extremities: No pitting pedal edema. Pulses bilaterally symmetric with radial 2+ and dorsalis pedis 2+ NEUROLOGIC:  Alert and oriented x 3  Medication Adjustments/Labs and Tests Ordered: Current medicines are reviewed at length with the patient today.  Concerns regarding medicines are outlined above.  Orders Placed This Encounter  Procedures   EKG 12-Lead   No orders  of the defined  types were placed in this encounter.   Signed, Alean jess Kobus, MD, MPH, [facility]. 03/09/2024 2:39 PM    [facility]

## 2024-03-09 NOTE — Assessment & Plan Note (Addendum)
 No to cardiac symptoms. Good functional status. Continue with left carotid endarterectomy as being planned, no further cardiac workup required at this time.  Perioperative blood pressure management aspirin  anesthetist, high risk for postural hypotension.  Okay to hold Eliquis  perioperatively for upcoming carotid endarterectomy. Will ideally have to be on a single antiplatelet agent. History of aspirin  allergy with significant peptic ulcer disease and GI bleeding. Strongly recommend to be on Plavix  75 mg once daily while Eliquis  is on hold if this is not prohibitive from surgical standpoint.  Should be on appropriate antibiotic coverage for perioperative infective endocarditis prophylaxis.

## 2024-03-09 NOTE — Assessment & Plan Note (Signed)
 Remains in sinus rhythm on EKG today. Elevated CHADS2 vascular score. Continue Eliquis  5 mg twice daily [on appropriate dose, weight 63 kg, creatinine 01/07/2024 0.92].  Okay to hold Eliquis  perioperatively for upcoming carotid endarterectomy. Will ideally have to be on a single antiplatelet agent. History of aspirin  allergy with significant peptic ulcer disease and GI bleeding. Consider Plavix  75 mg once daily while Eliquis  is on hold.SABRA

## 2024-03-09 NOTE — Patient Instructions (Signed)
 Medication Instructions:  Your physician recommends that you continue on your current medications as directed. Please refer to the Current Medication list given to you today.  *If you need a refill on your cardiac medications before your next appointment, please call your pharmacy*  Lab Work: None If you have labs (blood work) drawn today and your tests are completely normal, you will receive your results only by: MyChart Message (if you have MyChart) OR A paper copy in the mail If you have any lab test that is abnormal or we need to change your treatment, we will call you to review the results.  Testing/Procedures: None  Follow-Up: At [facility], you and your health needs are our priority.  As part of our continuing mission to provide you with exceptional heart care, our providers are all part of one team.  This team includes your primary Cardiologist (physician) and Advanced Practice Providers or APPs (Physician Assistants and Nurse Practitioners) who all work together to provide you with the care you need, when you need it.  Your next appointment:   6 month(s)  Provider:   Huntley Dec, MD    We recommend signing up for the patient portal called "MyChart".  Sign up information is provided on this After Visit Summary.  MyChart is used to connect with patients for Virtual Visits (Telemedicine).  Patients are able to view lab/test results, encounter notes, upcoming appointments, etc.  Non-urgent messages can be sent to your provider as well.   To learn more about what you can do with MyChart, go to [email or web address].   Other Instructions None

## 2024-03-09 NOTE — Assessment & Plan Note (Signed)
 Given history of aortic valve replacement and s/p LIMA-LAD, should be on at least single antiplatelet agent perioperatively. Continue with rosuvastatin  20 mg once daily.

## 2024-03-09 NOTE — Assessment & Plan Note (Signed)
 Transthoracic echocardiogram tentatively ordered for December 2025 at last office visit.  Will review once available.

## 2024-03-11 DIAGNOSIS — H40013 Open angle with borderline findings, low risk, bilateral: Secondary | ICD-10-CM | POA: Diagnosis not present

## 2024-03-11 DIAGNOSIS — E119 Type 2 diabetes mellitus without complications: Secondary | ICD-10-CM | POA: Diagnosis not present

## 2024-03-11 DIAGNOSIS — I2510 Atherosclerotic heart disease of native coronary artery without angina pectoris: Secondary | ICD-10-CM | POA: Diagnosis not present

## 2024-03-11 DIAGNOSIS — H34231 Retinal artery branch occlusion, right eye: Secondary | ICD-10-CM | POA: Diagnosis not present

## 2024-03-30 ENCOUNTER — Ambulatory Visit: Admitting: Neurology

## 2024-04-02 ENCOUNTER — Ambulatory Visit: Attending: Vascular Surgery | Admitting: Vascular Surgery

## 2024-04-02 ENCOUNTER — Encounter: Payer: Self-pay | Admitting: Vascular Surgery

## 2024-04-02 VITALS — BP 160/70 | HR 74 | Temp 98.4°F | Resp 18 | Ht 67.0 in | Wt 140.3 lb

## 2024-04-02 DIAGNOSIS — I6522 Occlusion and stenosis of left carotid artery: Secondary | ICD-10-CM | POA: Diagnosis not present

## 2024-04-02 MED ORDER — ASPIRIN 81 MG PO TBEC: 81.0000 mg | DELAYED_RELEASE_TABLET | Freq: Every day | ORAL | Status: DC

## 2024-04-02 MED ORDER — CLOPIDOGREL BISULFATE 75 MG PO TABS: 75.0000 mg | ORAL_TABLET | Freq: Every day | ORAL | 6 refills | Status: DC

## 2024-04-02 MED ORDER — PANTOPRAZOLE SODIUM 20 MG PO TBEC: 20.0000 mg | DELAYED_RELEASE_TABLET | Freq: Every day | ORAL | Status: DC

## 2024-04-02 NOTE — Progress Notes (Signed)
 Office Note    HPI: Bruce Nunez is a 83 y.o. (1941/08/06) male presenting for surgical discussion with known symptomatic left ICA stenosis  Originally from [place] , [place] worked as a Solicitor in Designer, fashion/clothing, moving to [employer] prior to retirement.  Patient was seen in 2023 and 2020 for due to concern for stroke.  CT demonstrated no concerns.  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  CTA demonstrated no concern for flow-limiting stenosis in the right internal carotid artery.  Left internal carotid artery demonstrated significant stenosis, 65 to 70%.  He presents today after cardiac clearance.  On exam, Bruce Nunez was doing well.  Accompanied by his wife and son.  He states that since last seen, he has had 2 episodes of right-sided head tingling that continued into the arm.  This resolved after several minutes.  Denied     Recent surgical history includes CABG, aortic valve replacement, pacemaker, completed in November 2024 by Dr. 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   Heart murmur    Hemorrhoids    History of kidney stones    Hx of colon cancer, stage I    Hx of nonmelanoma skin cancer    Hx of peptic ulcer    as teenager   Hx of transfusion    Hyperlipidemia    Hypertension    Inguinal hernia    Orthostatic hypotension 01/06/2024    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   PAF (paroxysmal atrial fibrillation) (HCC) 08/20/2023   PONV (postoperative nausea and vomiting)    only when had Ether as Anesthesia drug   Prostate cancer (HCC) 08/15/2012   also Hx colon cancer / Hx skin cancer   Right inguinal hernia 05/08/2016   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   S/P right inguinal hernia repair Sept 2015 05/17/2014   Recurrent right indirect  Hernia treated with robotic TAPP repair with enterolysis Sept 2017   Stroke [facility]) 03/28/2022   Syncope 08/17/2023    Past Surgical History:  Procedure Laterality Date   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   Procedure: AORTIC VALVE REPLACEMENT WITH INSPIRIS AORTIC VALVE;  Surgeon: Maryjane Mt, MD;  Location: [facility] OR;  Service: Open Heart Surgery;  Laterality: N/A;   cataract surgery  2009   CORONARY ARTERY BYPASS GRAFT N/A 07/05/2023   Procedure: CORONARY ARTERY BYPASS GRAFTING, USING LEFT INTERNAL MAMMARY ARTERY;  Surgeon: Maryjane Mt, MD;  Location: [facility] OR;  Service: Open Heart Surgery;  Laterality: N/A;   CYSTOSCOPY     INGUINAL HERNIA REPAIR Right 05/17/2014   Procedure: RIGHT INGUINAL HERNIA REPAIR ;  Surgeon: Adina Lunger, MD;  Location: [facility] ORS;  Service: General;  Laterality: Right;  With MESH   KNEE SURGERY Right 2012   LEFT HEART CATH AND CORONARY ANGIOGRAPHY  N/A 05/03/2023   Procedure: LEFT HEART CATH AND CORONARY ANGIOGRAPHY;  Surgeon: Wonda Sharper, MD;  Location: [facility] INVASIVE CV LAB;  Service: Cardiovascular;  Laterality: N/A;   PACEMAKER IMPLANT N/A 07/17/2023   Procedure: PACEMAKER IMPLANT;  Surgeon: Inocencio Soyla Lunger, MD;  Location: [facility] INVASIVE CV LAB;  Service: Cardiovascular;  Laterality: N/A;   PARTIAL COLECTOMY  2005   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   RADIOACTIVE SEED IMPLANT N/A 11/24/2012   Procedure: RADIOACTIVE SEED IMPLANT;  Surgeon: Alm GORMAN Fragmin, MD;  Location: [facility];  Service: Urology;   Laterality: N/A;   71seeds implanted  no seeds found in bladder   TEE WITHOUT CARDIOVERSION N/A 07/05/2023   Procedure: TRANSESOPHAGEAL ECHOCARDIOGRAM;  Surgeon: Maryjane Mt, MD;  Location: [facility] OR;  Service: Open Heart Surgery;  Laterality: N/A;   THROAT SURGERY  1940's   as child (age 30)growth that created fissure/corrective surgery   TONSILLECTOMY     age 41   XI ROBOTIC ASSISTED INGUINAL HERNIA REPAIR WITH MESH Right 05/08/2016   Procedure: XI ROBOTIC ASSISTED REPAIR OF RECURRENT RIGHT INGUINAL HERNIA;  Surgeon: Donnice Lunger, MD;  Location: [facility] ORS;  Service: General;  Laterality: Right;  With MESH    Social History   Socioeconomic History   Marital status: Married    Spouse name: Not on file   Number of children: 2   Years of education: Not on file   Highest education level: Not on file  Occupational History   Occupation: Retired    Comment: Designer, fashion/clothing  Tobacco Use   Smoking status: Never    Passive exposure: Never   Smokeless tobacco: Never  Vaping Use   Vaping status: Never Used  Substance and Sexual Activity   Alcohol use: No   Drug use: No   Sexual activity: Not on file  Other Topics Concern   Not on file  Social History Narrative   Not on file   Social Drivers of Health   Financial Resource Strain: Not on file  Food Insecurity: No Food Insecurity (08/17/2023)   Hunger Vital Sign    Worried About Running Out of Food in the Last Year: Never true    Ran Out of Food in the Last Year: Never true  Transportation Needs: No Transportation Needs (08/17/2023)   PRAPARE - Administrator, Civil Service (Medical): No    Lack of Transportation (Non-Medical): No  Physical Activity: Not on file  Stress: Not on file  Social Connections: Not on file  Intimate Partner Violence: Not At Risk (08/17/2023)   Humiliation, Afraid, Rape, and Kick questionnaire    Fear of Current or Ex-Partner: No    Emotionally Abused: No    Physically Abused: No    Sexually Abused:  No   Family History  Problem Relation Age of Onset   Heart attack Other    Brain cancer Other    Coronary artery disease Other    Prostate cancer Other     Current Outpatient Medications  Medication Sig Dispense Refill   acetaminophen  (TYLENOL ) 325 MG tablet Take 1-2 tablets (325-650 mg total) by mouth every 6 (six) hours as needed for mild pain (pain score 1-3).     albuterol  (PROVENTIL  HFA;VENTOLIN  HFA) 108 (90 BASE) MCG/ACT inhaler Inhale 2 puffs into the lungs every 6 (six) hours as needed for shortness of breath.     apixaban  (ELIQUIS ) 5 MG TABS tablet Take 1 tablet (5 mg total) by mouth 2 (two) times  daily.     budesonide -formoterol  (SYMBICORT) 160-4.5 MCG/ACT inhaler Inhale 2 puffs into the lungs 2 (two) times daily.     cyanocobalamin  (,VITAMIN B-12,) 1000 MCG/ML injection Inject 1 mL into the skin every 30 (thirty) days.     empagliflozin  (JARDIANCE ) 10 MG TABS tablet Take 1 tablet (10 mg total) by mouth daily. 90 tablet 2   fexofenadine (ALLEGRA) 180 MG tablet Take 180 mg by mouth daily.     fluticasone  (FLONASE ) 50 MCG/ACT nasal spray Place 2 sprays into both nostrils 2 (two) times daily.     levothyroxine  (SYNTHROID ) 75 MCG tablet Take 75 mcg by mouth daily before breakfast.     metoprolol  succinate (TOPROL -XL) 25 MG 24 hr tablet Take 1 tablet (25 mg total) by mouth daily. Take with or immediately following a meal. 90 tablet 3   midodrine  (PROAMATINE ) 5 MG tablet Take 1 tablet (5 mg total) by mouth 3 (three) times daily with meals. Take the third dose of this medication at 3 pm 270 tablet 2   omeprazole  (PRILOSEC ) 20 MG capsule Take 20 mg by mouth in the morning.     rosuvastatin  (CRESTOR ) 20 MG tablet Take 1 tablet (20 mg total) by mouth daily. 90 tablet 3   solifenacin (VESICARE) 10 MG tablet Take 10 mg by mouth daily.     tamsulosin  (FLOMAX ) 0.4 MG CAPS capsule Take 1 capsule (0.4 mg total) by mouth daily. 90 capsule 3   predniSONE  (DELTASONE ) 50 MG tablet Take 1 tablet on  4/3 at 2:00 AM, Take 1 tablet on 4/3 at 8:00 AM, Take 1 tablet on 4/3 at 2:00 PM with Benadryl  50 mg. (Patient not taking: Reported on 04/02/2024) 3 tablet 0   No current facility-administered medications for this visit.    Allergies  Allergen Reactions   Penicillins Anaphylaxis, Shortness Of Breath, Itching and Swelling    Throat swells up but no intubation per wife   Iodinated Contrast Media Hives and Rash   Dorethia Givens ] Other (See Comments)    Hx of GI bleeds   Metformin And Related Diarrhea   Monodox [Doxycycline Hyclate] Nausea Only   Sulfa Antibiotics Itching and Swelling   Ultram [Tramadol] Nausea And Vomiting     REVIEW OF SYSTEMS:  [X]  denotes positive finding, [ ]  denotes negative finding Cardiac  Comments:  Chest pain or chest pressure:    Shortness of breath upon exertion:    Short of breath when lying flat:    Irregular heart rhythm:        Vascular    Pain in calf, thigh, or hip brought on by ambulation:    Pain in feet at night that wakes you up from your sleep:     Blood clot in your veins:    Leg swelling:         Pulmonary    Oxygen  at home:    Productive cough:     Wheezing:         Neurologic    Sudden weakness in arms or legs:     Sudden numbness in arms or legs:     Sudden onset of difficulty speaking or slurred speech:    Temporary loss of vision in one eye:     Problems with dizziness:         Gastrointestinal    Blood in stool:     Vomited blood:         Genitourinary    Burning when urinating:  Blood in urine:        Psychiatric    Major depression:         Hematologic    Bleeding problems:    Problems with blood clotting too easily:        Skin    Rashes or ulcers:        Constitutional    Fever or chills:      PHYSICAL EXAMINATION:  Vitals:   04/02/24 1529  BP: (!) 160/70  Pulse: 74  Resp: 18  Temp: 98.4 F (36.9 C)  TempSrc: Temporal  SpO2: 97%  Weight: 140 lb 4.8 oz (63.6 kg)  Height: 5' 7 (1.702 m)     General:  WDWN in NAD; vital signs documented above Gait: Not observed HENT: WNL, normocephalic Pulmonary: normal non-labored breathing , without wheezing Cardiac: regular HR Abdomen: soft, NT, no masses Skin: without rashes Vascular Exam/Pulses:  Right Left  Radial 2+ (normal) 2+ (normal)  Ulnar    Femoral    Popliteal    DP 2+ (normal) 2+ (normal)  PT     Extremities: without ischemic changes, without Gangrene , without cellulitis; without open wounds;  Musculoskeletal: no muscle wasting or atrophy  Neurologic: A&O X 3;  No focal weakness or paresthesias are detected Psychiatric:  The pt has Normal affect.   Non-Invasive Vascular Imaging:     Right Carotid Findings:  +----------+--------+--------+--------+------------------+-----------------  ----+           PSV cm/sEDV cm/sStenosisPlaque DescriptionComments                +----------+--------+--------+--------+------------------+-----------------  ----+  CCA Prox  141     12                                                        +----------+--------+--------+--------+------------------+-----------------  ----+  CCA Distal138     17                                intimal  thickening     +----------+--------+--------+--------+------------------+-----------------  ----+  ICA Prox  171     20      40-59%  heterogenous      stenosis based  on PSV                                                      and plaque  formation   +----------+--------+--------+--------+------------------+-----------------  ----+  ICA Distal167     27                                                        +----------+--------+--------+--------+------------------+-----------------  ----+  ECA      125     0               heterogenous                              +----------+--------+--------+--------+------------------+-----------------  ----+    +----------+--------+-------+----------------+-------------------+  PSV cm/sEDV cmsDescribe        Arm Pressure (mmHG)  +----------+--------+-------+----------------+-------------------+  Subclavian103    0      Multiphasic, TWO829                  +----------+--------+-------+----------------+-------------------+   +---------+--------+--+--------+-+---------+  VertebralPSV cm/s42EDV cm/s8Antegrade  +---------+--------+--+--------+-+---------+      Left Carotid Findings:  +----------+--------+--------+--------+------------------+--------+           PSV cm/sEDV cm/sStenosisPlaque DescriptionComments  +----------+--------+--------+--------+------------------+--------+  CCA Prox  79      0       <50%    heterogenous                +----------+--------+--------+--------+------------------+--------+  CCA Distal68      12                                          +----------+--------+--------+--------+------------------+--------+  ICA Prox  518     124     80-99%  heterogenous                +----------+--------+--------+--------+------------------+--------+  ICA Mid   127     0                                           +----------+--------+--------+--------+------------------+--------+  ICA Distal40      0                                           +----------+--------+--------+--------+------------------+--------+  ECA      124     0               heterogenous                +----------+--------+--------+--------+------------------+--------+   +----------+--------+--------+----------------+-------------------+           PSV cm/sEDV cm/sDescribe        Arm Pressure (mmHG)  +----------+--------+--------+----------------+-------------------+  Subclavian229    0       Multiphasic, TWO826                  +----------+--------+--------+----------------+-------------------+    +---------+--------+--+--------+--+---------+  VertebralPSV cm/s66EDV cm/s15Antegrade  +---------+--------+--+--------+--+---------+         ASSESSMENT/PLAN: Bruce Nunez is a 83 y.o. male presenting with symptomatic left-sided ICA stenosis.  I had a long conversation with Bruce Nunez regarding the above, most notably that with continued medical management he has a 26% risk of stroke over the next 2 years.  With surgical intervention, this risk decreases to 9%.  I have evaluated his prior CT scan.  He is not a candidate for transcarotid artery revascularization, but is a candidate for carotid endarterectomy.  We had a long conversation today regarding the risks and benefits.  He has been cleared by cardiology.  Symptoms in the interim sound more like a pinched nerve rather than a stroke.  My plan is for him to transition to dual antiplatelet therapy 5 days prior to his procedure.  He will stop Eliquis  at that time.  We discussed signs and symptoms of stroke, TIA, amaurosis.  I asked him to call 911 should any of these occur.   Fonda FORBES Rim, MD [facility] [phone]

## 2024-04-03 ENCOUNTER — Other Ambulatory Visit: Payer: Self-pay | Admitting: *Deleted

## 2024-04-03 DIAGNOSIS — I6522 Occlusion and stenosis of left carotid artery: Secondary | ICD-10-CM

## 2024-04-03 MED ORDER — CLOPIDOGREL BISULFATE 75 MG PO TABS: 75.0000 mg | ORAL_TABLET | Freq: Every day | ORAL | 6 refills | Status: DC

## 2024-04-06 ENCOUNTER — Other Ambulatory Visit: Payer: Self-pay | Admitting: *Deleted

## 2024-04-07 DIAGNOSIS — I209 Angina pectoris, unspecified: Secondary | ICD-10-CM | POA: Diagnosis not present

## 2024-04-07 DIAGNOSIS — E1159 Type 2 diabetes mellitus with other circulatory complications: Secondary | ICD-10-CM | POA: Diagnosis not present

## 2024-04-07 DIAGNOSIS — I351 Nonrheumatic aortic (valve) insufficiency: Secondary | ICD-10-CM | POA: Diagnosis not present

## 2024-04-07 DIAGNOSIS — E782 Mixed hyperlipidemia: Secondary | ICD-10-CM | POA: Diagnosis not present

## 2024-04-07 DIAGNOSIS — Z951 Presence of aortocoronary bypass graft: Secondary | ICD-10-CM | POA: Diagnosis not present

## 2024-04-07 DIAGNOSIS — E1139 Type 2 diabetes mellitus with other diabetic ophthalmic complication: Secondary | ICD-10-CM | POA: Diagnosis not present

## 2024-04-07 DIAGNOSIS — E034 Atrophy of thyroid (acquired): Secondary | ICD-10-CM | POA: Diagnosis not present

## 2024-04-07 DIAGNOSIS — I119 Hypertensive heart disease without heart failure: Secondary | ICD-10-CM | POA: Diagnosis not present

## 2024-04-07 DIAGNOSIS — I2510 Atherosclerotic heart disease of native coronary artery without angina pectoris: Secondary | ICD-10-CM | POA: Diagnosis not present

## 2024-04-07 NOTE — Progress Notes (Signed)
 Surgical Instructions   Your procedure is scheduled on April 10, 2024. Report to [facility] Main Entrance A at 5:30 A.M., then check in with the Admitting office. Any questions or running late day of surgery: call [phone]  Questions prior to your surgery date: call [phone], Monday-Friday, 8am-4pm. If you experience any cold or flu symptoms such as cough, fever, chills, shortness of breath, etc. between now and your scheduled surgery, please notify us  at the above number.     Remember:  Do not eat or drink after midnight the night before your surgery   Take these medicines the morning of surgery with A SIP OF WATER   aspirin   SYMBICORT  clopidogrel  (PLAVIX   fexofenadine (ALLEGRA)  levothyroxine  (SYNTHROID )  metoprolol  succinate (TOPROL -XL)  midodrine  (PROAMATINE )  pantoprazole  (PROTONIX )  rosuvastatin  (CRESTOR )  solifenacin (VESICARE)  tamsulosin  (FLOMAX )   May take these medicines IF NEEDED: acetaminophen  (TYLENOL )  albuterol  inhaler  fluticasone  (FLONASE )     One week prior to surgery, STOP taking any Aspirin  (unless otherwise instructed by your surgeon) Aleve, Naproxen, Ibuprofen, Motrin, Advil, Goody's, BC's, all herbal medications, fish oil, and non-prescription vitamins. WHAT DO I DO ABOUT MY DIABETES MEDICATION?   Do not take oral diabetes medicines empagliflozin  (JARDIANCE ) the morning of surgery.  LAST DOSE 04-07-24  The day of surgery, do not take other diabetes injectables, including Byetta (exenatide), Bydureon (exenatide ER), Victoza (liraglutide), or Trulicity (dulaglutide).  If your CBG is greater than 220 mg/dL, you may take  of your sliding scale (correction) dose of insulin .   HOW TO MANAGE YOUR DIABETES BEFORE AND AFTER SURGERY  Why is it important to control my blood sugar before and after surgery? Improving blood sugar levels before and after surgery helps healing and can limit problems. A way of improving blood sugar control is  eating a healthy diet by:  Eating less sugar and carbohydrates  Increasing activity/exercise  Talking with your doctor about reaching your blood sugar goals High blood sugars (greater than 180 mg/dL) can raise your risk of infections and slow your recovery, so you will need to focus on controlling your diabetes during the weeks before surgery. Make sure that the doctor who takes care of your diabetes knows about your planned surgery including the date and location.  How do I manage my blood sugar before surgery? Check your blood sugar at least 4 times a day, starting 2 days before surgery, to make sure that the level is not too high or low.  Check your blood sugar the morning of your surgery when you wake up and every 2 hours until you get to the Short Stay unit.  If your blood sugar is less than 70 mg/dL, you will need to treat for low blood sugar: Do not take insulin . Treat a low blood sugar (less than 70 mg/dL) with  cup of clear juice (cranberry or apple), 4 glucose tablets, OR glucose gel. Recheck blood sugar in 15 minutes after treatment (to make sure it is greater than 70 mg/dL). If your blood sugar is not greater than 70 mg/dL on recheck, call [phone] for further instructions. Report your blood sugar to the short stay nurse when you get to Short Stay.  If you are admitted to the hospital after surgery: Your blood sugar will be checked by the staff and you will probably be given insulin  after surgery (instead of oral diabetes medicines) to make sure you have good blood sugar levels. The goal for blood sugar control after surgery is 80-180  mg/dL.                     Do NOT Smoke (Tobacco/Vaping) for 24 hours prior to your procedure.  If you use a CPAP at night, you may bring your mask/headgear for your overnight stay.   You will be asked to remove any contacts, glasses, piercing's, hearing aid's, dentures/partials prior to surgery. Please bring cases for these items if needed.     Patients discharged the day of surgery will not be allowed to drive home, and someone needs to stay with them for 24 hours.  SURGICAL WAITING ROOM VISITATION Patients may have no more than 2 support people in the waiting area - these visitors may rotate.   Pre-op nurse will coordinate an appropriate time for 1 ADULT support person, who may not rotate, to accompany patient in pre-op.  Children under the age of 46 must have an adult with them who is not the patient and must remain in the main waiting area with an adult.  If the patient needs to stay at the hospital during part of their recovery, the visitor guidelines for inpatient rooms apply.  Please refer to the [facility] website for the visitor guidelines for any additional information.   If you received a COVID test during your pre-op visit  it is requested that you wear a mask when out in public, stay away from anyone that may not be feeling well and notify your surgeon if you develop symptoms. If you have been in contact with anyone that has tested positive in the last 10 days please notify you surgeon.      Pre-operative CHG Bathing Instructions   You can play a key role in reducing the risk of infection after surgery. Your skin needs to be as free of germs as possible. You can reduce the number of germs on your skin by washing with CHG (chlorhexidine  gluconate) soap before surgery. CHG is an antiseptic soap that kills germs and continues to kill germs even after washing.   DO NOT use if you have an allergy to chlorhexidine /CHG or antibacterial soaps. If your skin becomes reddened or irritated, stop using the CHG and notify one of our RNs at [phone].              TAKE A SHOWER THE NIGHT BEFORE SURGERY AND THE DAY OF SURGERY    Please keep in mind the following:  DO NOT shave, including legs and underarms, 48 hours prior to surgery.   You may shave your face before/day of surgery.  Place clean sheets on your bed the night  before surgery Use a clean washcloth (not used since being washed) for each shower. DO NOT sleep with pet's night before surgery.  CHG Shower Instructions:  Wash your face and private area with normal soap. If you choose to wash your hair, wash first with your normal shampoo.  After you use shampoo/soap, rinse your hair and body thoroughly to remove shampoo/soap residue.  Turn the water  OFF and apply half the bottle of CHG soap to a CLEAN washcloth.  Apply CHG soap ONLY FROM YOUR NECK DOWN TO YOUR TOES (washing for 3-5 minutes)  DO NOT use CHG soap on face, private areas, open wounds, or sores.  Pay special attention to the area where your surgery is being performed.  If you are having back surgery, having someone wash your back for you may be helpful. Wait 2 minutes after CHG soap is applied,  then you may rinse off the CHG soap.  Pat dry with a clean towel  Put on clean pajamas    Additional instructions for the day of surgery: DO NOT APPLY any lotions, deodorants, cologne, or perfumes.   Do not wear jewelry or makeup Do not wear nail polish, gel polish, artificial nails, or any other type of covering on natural nails (fingers and toes) Do not bring valuables to the hospital. [facility] is not responsible for valuables/personal belongings. Put on clean/comfortable clothes.  Please brush your teeth.  Ask your nurse before applying any prescription medications to the skin.

## 2024-04-08 ENCOUNTER — Encounter (HOSPITAL_COMMUNITY)
Admission: RE | Admit: 2024-04-08 | Discharge: 2024-04-08 | Disposition: A | Discharge status: Home / Self Care | Source: Ambulatory Visit | Attending: Vascular Surgery | Admitting: Vascular Surgery

## 2024-04-08 ENCOUNTER — Other Ambulatory Visit: Payer: Self-pay

## 2024-04-08 ENCOUNTER — Encounter (HOSPITAL_COMMUNITY): Payer: Self-pay

## 2024-04-08 ENCOUNTER — Encounter: Payer: Self-pay | Admitting: Cardiology

## 2024-04-08 VITALS — BP 165/93 | HR 84 | Temp 97.8°F | Resp 18 | Ht 67.0 in | Wt 139.9 lb

## 2024-04-08 DIAGNOSIS — E039 Hypothyroidism, unspecified: Secondary | ICD-10-CM | POA: Diagnosis not present

## 2024-04-08 DIAGNOSIS — L7632 Postprocedural hematoma of skin and subcutaneous tissue following other procedure: Secondary | ICD-10-CM | POA: Diagnosis not present

## 2024-04-08 DIAGNOSIS — Z01812 Encounter for preprocedural laboratory examination: Secondary | ICD-10-CM | POA: Diagnosis not present

## 2024-04-08 DIAGNOSIS — I10 Essential (primary) hypertension: Secondary | ICD-10-CM | POA: Diagnosis not present

## 2024-04-08 DIAGNOSIS — I4510 Unspecified right bundle-branch block: Secondary | ICD-10-CM | POA: Insufficient documentation

## 2024-04-08 DIAGNOSIS — Z952 Presence of prosthetic heart valve: Secondary | ICD-10-CM | POA: Diagnosis not present

## 2024-04-08 DIAGNOSIS — I6522 Occlusion and stenosis of left carotid artery: Secondary | ICD-10-CM | POA: Insufficient documentation

## 2024-04-08 DIAGNOSIS — Z8711 Personal history of peptic ulcer disease: Secondary | ICD-10-CM | POA: Diagnosis not present

## 2024-04-08 DIAGNOSIS — Z950 Presence of cardiac pacemaker: Secondary | ICD-10-CM | POA: Insufficient documentation

## 2024-04-08 DIAGNOSIS — I2510 Atherosclerotic heart disease of native coronary artery without angina pectoris: Secondary | ICD-10-CM | POA: Insufficient documentation

## 2024-04-08 DIAGNOSIS — Z8673 Personal history of transient ischemic attack (TIA), and cerebral infarction without residual deficits: Secondary | ICD-10-CM | POA: Diagnosis not present

## 2024-04-08 DIAGNOSIS — Y838 Other surgical procedures as the cause of abnormal reaction of the patient, or of later complication, without mention of misadventure at the time of the procedure: Secondary | ICD-10-CM | POA: Diagnosis not present

## 2024-04-08 DIAGNOSIS — E119 Type 2 diabetes mellitus without complications: Secondary | ICD-10-CM | POA: Insufficient documentation

## 2024-04-08 DIAGNOSIS — I4891 Unspecified atrial fibrillation: Secondary | ICD-10-CM | POA: Diagnosis not present

## 2024-04-08 DIAGNOSIS — Z01818 Encounter for other preprocedural examination: Secondary | ICD-10-CM

## 2024-04-08 DIAGNOSIS — Z951 Presence of aortocoronary bypass graft: Secondary | ICD-10-CM | POA: Insufficient documentation

## 2024-04-08 DIAGNOSIS — Z85828 Personal history of other malignant neoplasm of skin: Secondary | ICD-10-CM | POA: Diagnosis not present

## 2024-04-08 DIAGNOSIS — I480 Paroxysmal atrial fibrillation: Secondary | ICD-10-CM | POA: Diagnosis not present

## 2024-04-08 DIAGNOSIS — Z886 Allergy status to analgesic agent status: Secondary | ICD-10-CM | POA: Diagnosis not present

## 2024-04-08 DIAGNOSIS — E785 Hyperlipidemia, unspecified: Secondary | ICD-10-CM | POA: Insufficient documentation

## 2024-04-08 DIAGNOSIS — Z808 Family history of malignant neoplasm of other organs or systems: Secondary | ICD-10-CM | POA: Diagnosis not present

## 2024-04-08 DIAGNOSIS — Z8042 Family history of malignant neoplasm of prostate: Secondary | ICD-10-CM | POA: Diagnosis not present

## 2024-04-08 DIAGNOSIS — I082 Rheumatic disorders of both aortic and tricuspid valves: Secondary | ICD-10-CM | POA: Insufficient documentation

## 2024-04-08 DIAGNOSIS — Z882 Allergy status to sulfonamides status: Secondary | ICD-10-CM | POA: Diagnosis not present

## 2024-04-08 DIAGNOSIS — Z953 Presence of xenogenic heart valve: Secondary | ICD-10-CM | POA: Insufficient documentation

## 2024-04-08 DIAGNOSIS — R710 Precipitous drop in hematocrit: Secondary | ICD-10-CM | POA: Diagnosis not present

## 2024-04-08 DIAGNOSIS — Z85528 Personal history of other malignant neoplasm of kidney: Secondary | ICD-10-CM | POA: Diagnosis not present

## 2024-04-08 DIAGNOSIS — Z888 Allergy status to other drugs, medicaments and biological substances status: Secondary | ICD-10-CM | POA: Diagnosis not present

## 2024-04-08 DIAGNOSIS — Z91041 Radiographic dye allergy status: Secondary | ICD-10-CM | POA: Diagnosis not present

## 2024-04-08 DIAGNOSIS — Z87442 Personal history of urinary calculi: Secondary | ICD-10-CM | POA: Diagnosis not present

## 2024-04-08 DIAGNOSIS — Z8249 Family history of ischemic heart disease and other diseases of the circulatory system: Secondary | ICD-10-CM | POA: Diagnosis not present

## 2024-04-08 DIAGNOSIS — Z8546 Personal history of malignant neoplasm of prostate: Secondary | ICD-10-CM | POA: Diagnosis not present

## 2024-04-08 DIAGNOSIS — Z85038 Personal history of other malignant neoplasm of large intestine: Secondary | ICD-10-CM | POA: Insufficient documentation

## 2024-04-08 DIAGNOSIS — Z9049 Acquired absence of other specified parts of digestive tract: Secondary | ICD-10-CM | POA: Diagnosis not present

## 2024-04-08 HISTORY — DX: Personal history of other malignant neoplasm of kidney: Z85.528

## 2024-04-08 LAB — PROTIME-INR
INR: 1 (ref 0.8–1.2)
Prothrombin Time: 14.1 s (ref 11.4–15.2)

## 2024-04-08 LAB — CBC
HCT: 41.6 % (ref 39.0–52.0)
Hemoglobin: 11.9 g/dL — ABNORMAL LOW (ref 13.0–17.0)
MCH: 20.2 pg — ABNORMAL LOW (ref 26.0–34.0)
MCHC: 28.6 g/dL — ABNORMAL LOW (ref 30.0–36.0)
MCV: 70.7 fL — ABNORMAL LOW (ref 80.0–100.0)
Platelets: 225 K/uL (ref 150–400)
RBC: 5.88 MIL/uL — ABNORMAL HIGH (ref 4.22–5.81)
RDW: 18.6 % — ABNORMAL HIGH (ref 11.5–15.5)
WBC: 8.6 K/uL (ref 4.0–10.5)
nRBC: 0 % (ref 0.0–0.2)

## 2024-04-08 LAB — COMPREHENSIVE METABOLIC PANEL WITH GFR
ALT: 16 U/L (ref 0–44)
AST: 19 U/L (ref 15–41)
Albumin: 3.9 g/dL (ref 3.5–5.0)
Alkaline Phosphatase: 65 U/L (ref 38–126)
Anion gap: 9 (ref 5–15)
BUN: 22 mg/dL (ref 8–23)
CO2: 25 mmol/L (ref 22–32)
Calcium: 9.6 mg/dL (ref 8.9–10.3)
Chloride: 104 mmol/L (ref 98–111)
Creatinine, Ser: 0.9 mg/dL (ref 0.61–1.24)
GFR, Estimated: >60 mL/min (ref >60)
Glucose, Bld: 136 mg/dL — ABNORMAL HIGH (ref 70–99)
Potassium: 4 mmol/L (ref 3.5–5.1)
Sodium: 138 mmol/L (ref 135–145)
Total Bilirubin: 1.1 mg/dL (ref 0.0–1.2)
Total Protein: 6.6 g/dL (ref 6.5–8.1)

## 2024-04-08 LAB — URINALYSIS, ROUTINE W REFLEX MICROSCOPIC
Bacteria, UA: NONE SEEN
Bilirubin Urine: NEGATIVE
Glucose, UA: >=500 mg/dL — AB
Hgb urine dipstick: NEGATIVE
Ketones, ur: NEGATIVE mg/dL
Leukocytes,Ua: NEGATIVE
Nitrite: NEGATIVE
Protein, ur: NEGATIVE mg/dL
Specific Gravity, Urine: 1.022 (ref 1.005–1.030)
pH: 5 (ref 5.0–8.0)

## 2024-04-08 LAB — TYPE AND SCREEN
ABO/RH(D): O POS
Antibody Screen: NEGATIVE

## 2024-04-08 LAB — GLUCOSE, CAPILLARY: Glucose-Capillary: 181 mg/dL — ABNORMAL HIGH (ref 70–99)

## 2024-04-08 LAB — APTT: aPTT: 31 s (ref 24–36)

## 2024-04-08 LAB — SURGICAL PCR SCREEN
MRSA, PCR: NEGATIVE
Staphylococcus aureus: NEGATIVE

## 2024-04-08 LAB — HEMOGLOBIN A1C
Hgb A1c MFr Bld: 7.2 % — ABNORMAL HIGH (ref 4.8–5.6)
Mean Plasma Glucose: 160 mg/dL

## 2024-04-08 NOTE — Progress Notes (Signed)
 PERIOPERATIVE PRESCRIPTION FOR IMPLANTED CARDIAC DEVICE PROGRAMMING  Patient Information: Name:  Bruce Nunez  DOB:  02/08/1941  MRN:  [id number]  Planned Procedure:  Endartectomy Carotid Left  Surgeon:  Dr Lanis  Date of Procedure:  04-10-24  Cautery will be used.  Position during surgery:  supine   Device Information:  Clinic EP Physician:  Soyla Norton, MD   Device Type:  Pacemaker Manufacturer and Phone #:  [employer] Jude/[employer]ott: [phone] Pacemaker Dependent?:  No. Date of Last Device Check:  01/17/2024 Normal Device Function?:  Yes.    Electrophysiologist's Recommendations:  Have magnet available. Provide continuous ECG monitoring when magnet is used or reprogramming is to be performed.  Procedure may interfere with device function.  Magnet should be placed over device during procedure.  Per Device Clinic Standing Orders, Bruce Nunez Sharps, RN  12:06 PM 04/08/2024

## 2024-04-08 NOTE — Progress Notes (Signed)
 PCP -  [name], PA  Cardiologist - Redell Leiter, MD {   PPM/ICD - Pacemaker A[employer]ott Device Orders - yes Rep Notified - Spoke with Redell on 04-08-24 at 1510  Chest x-ray - 11-08-23 EKG - 03-09-24 Stress Test - 08-10-15 ECHO - 08-18-23 Cardiac Cath - 05-03-23  Sleep Study - denies CPAP - n/a  DM- Md monitors A1c  Blood Thinner Instructions:apixaban  (ELIQUIS ) Last dose 04-04-24 clopidogrel  (PLAVIX ) 04-05-24 Aspirin  Instructions:  ERAS Protcol -NPO    COVID TEST-n/a   Anesthesia review: Yes, hx of A fib, DM, HTN, Heart Murmur, AV block  Patient denies shortness of breath, fever, cough and chest pain at PAT appointment   All instructions explained to the patient, with a verbal understanding of the material. Patient agrees to go over the instructions while at home for a better understanding. Patient also instructed to self quarantine after being tested for COVID-19. The opportunity to ask questions was provided.

## 2024-04-09 NOTE — Progress Notes (Signed)
 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 He has symptomatic left carotid artery stenosis. Per 04/02/24 VVS note, plan to transition to dual antiplatelet therapy 5 days prior to his procedure and will stop Eliquis  at that time.   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  Last cardiology follow-up with Dr. Liborio was on 03/09/24. In regards to preoperative input, he wrote: No to cardiac symptoms. Good functional status. Continue with left carotid endarterectomy as being planned, no further cardiac workup required at this time.   Perioperative blood pressure management aspirin  anesthetist, high risk for postural hypotension.   Okay to hold Eliquis  perioperatively for upcoming carotid endarterectomy. Will ideally have to be on a single antiplatelet agent. History of aspirin  allergy with significant  peptic ulcer disease and GI bleeding. Strongly recommend to be on Plavix  75 mg once daily while Eliquis  is on hold if this is not prohibitive from surgical standpoint.   Should be on appropriate antibiotic coverage for perioperative infective endocarditis prophylaxis.   EP Perioperative PPM Recommendations: Device Information: Clinic EP Physician:  Soyla Norton, MD  Device Type:  Pacemaker Manufacturer and Phone #:  St. Jude/Abbott: [phone] Pacemaker Dependent?:  No. Date of Last Device Check:  01/17/2024       Normal Device Function?:  Yes.     Electrophysiologist's Recommendations: Have magnet available. Provide continuous ECG monitoring when magnet is used or reprogramming is to be performed.  Procedure may interfere with device function.  Magnet should be placed over device during procedure.   Anesthesia team to evaluate on the day of surgery.    VS: BP (!) 165/93   Pulse 84   Temp 36.6 C   Resp 18   Ht 5' 7 (1.702 m)   Wt 63.5 kg   SpO2 98%   BMI 21.91 kg/m   PROVIDERS: [name], PA is PCP  Monetta Rogue, MD is primary cardiologist, although last view visits have been with Madireddy, Alean, MD Norton Soyla, MD is EP  Lanis Fonda, MD is vascular surgeon Rosemarie Rothman, MD is neurologist   LABS: Labs reviewed: Acceptable for surgery. (all labs ordered are listed, but only abnormal results are displayed)  Labs Reviewed  GLUCOSE, CAPILLARY - Abnormal; Notable for the following components:      Result Value   Glucose-Capillary 181 (*)    All other components within normal limits  CBC - Abnormal; Notable for the following components:   RBC 5.88 (*)    Hemoglobin 11.9 (*)    MCV 70.7 (*)  MCH 20.2 (*)    MCHC 28.6 (*)    RDW 18.6 (*)    All other components within normal limits  COMPREHENSIVE METABOLIC PANEL WITH GFR - Abnormal; Notable for the following components:   Glucose, Bld 136 (*)    All other components within normal limits   URINALYSIS, ROUTINE W REFLEX MICROSCOPIC - Abnormal; Notable for the following components:   Color, Urine STRAW (*)    Glucose, UA >=500 (*)    All other components within normal limits  HEMOGLOBIN A1C - Abnormal; Notable for the following components:   Hgb A1c MFr Bld 7.2 (*)    All other components within normal limits  SURGICAL PCR SCREEN  PROTIME-INR  APTT  TYPE AND SCREEN     IMAGES: MRI C-spine 12/31/2023: IMPRESSION: 1. Multilevel mild degenerative disc disease and facet arthrosis, without significant spinal canal or neural foraminal stenosis. No apparent spinal cord or nerve root impingement.   CTA Neck 11/27/2023: IMPRESSION: 1. Calcified and noncalcified plaque at the left carotid bifurcation and proximal left ICA results in moderate stenosis of the proximal left ICA, estimated to be 60-70%. 2. More mild calcifications at the right carotid bifurcation without a significant stenosis relative to the more distal vessel 3. Moderate stenosis at the origin of the right vertebral artery. 4. Dense calcifications at the dural margin of the left vertebral artery result in moderate focal stenosis.   CT Head 11/07/2023: IMPRESSION: 1. Generalized cerebral atrophy and microvascular disease changes of the supratentorial brain. 2. No acute intracranial abnormality.   EKG: 03/09/2024: Normal sinus rhythm Left axis deviation Right bundle branch block Moderate voltage criteria for LVH, may be normal variant Inferior infarct , age undetermined Anterior infarct , age undetermined Abnormal ECG When compared with ECG of 14-Nov-2023 14:56, Anterior infarct is now Present Inferior infarct is now Present ... Confirmed By: Alean reddy Madireddy   CV: US  Carotid 03/05/2024: Summary:  - Right Carotid: Velocities in the right ICA are consistent with a 40-59% stenosis. RICA stenosis based on PSV and plaque formation.  - Left Carotid: Velocities in the left ICA are consistent with a  80-99% stenosis. Non-hemodynamically significant plaque <50% noted in the CCA.  - Vertebrals: Bilateral vertebral arteries demonstrate antegrade flow.  - Subclavians: Normal flow hemodynamics were seen in bilateral subclavian arteries.    Echo (Limited) 08/18/2023: IMPRESSIONS   1. Left ventricular ejection fraction, by estimation, is 70 to 75%. The  left ventricle has hyperdynamic function. The left ventricle has no  regional wall motion abnormalities. There is mild left ventricular  hypertrophy.   2. Right ventricular systolic function is normal. The right ventricular  size is normal. Tricuspid regurgitation signal is inadequate for assessing  PA pressure.   3. A small pericardial effusion is present.   4. The mitral valve is normal in structure. Trivial mitral valve  regurgitation.   5. The aortic valve has been repaired/replaced. Aortic valve  regurgitation is not visualized. There is a 21 mm Inspiris valve present  in the aortic position. Vmax 2.6 m/s, MG , EOA 1.1 cm^2, DI 0.5   6. The inferior vena cava is normal in size with greater than 50%  respiratory variability, suggesting right atrial pressure of 3 mmHg.   Echo (Complete) 08/10/2023 [facility], scanned under Results Review): Conclusions: Moderate concentric left ventricular hypertrophy.  Hyperdynamic LV systolic function.  Estimated LVEF 70 to 75%. Right ventricular global systolic function is normal. The left atrium is mildly dilated. There is mild mitral  stenosis, mean gradient 3 mmHg and heart rate 86 bpm. Bioprosthetic aortic valve appears well-seated with V-max 1.9 cm/s, mean gradient 9 mmHg, dimensionless index 0.44.  (21 mm Inspiris AVR plus CABG LIMA-LAD on 07/05/2023 at [facility]). There is mild tricuspid valve regurgitation. In comparison prior echocardiogram report from 07/16/2023 at outside hospital noted LVEF 60 to 65%, bioprosthetic aortic valve, V-max 1.7 mm/s, mean gradient 6  mmHg.   Cardiac cath 05/03/2023 (PRE-CABG/AVR):   Prox RCA to Mid RCA lesion is 50% stenosed.   Mid LM to Dist LM lesion is 30% stenosed.   Prox Cx to Dist Cx lesion is 50% stenosed.   Prox LAD to Mid LAD lesion is 50% stenosed.   1st Diag lesion is 60% stenosed.   There is severe (4+) aortic regurgitation.   1.  Moderate multivessel coronary artery disease with mild distal left main plaquing of 30%, moderate diffuse LAD stenosis of 50%, mild to moderate mid circumflex stenosis of 40 to 50%, and moderate mid RCA stenosis of 50%. 2.  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  Severe right subclavian tortuosity makes cardiac catheterization very difficult from right radial access.  If patient requires future cardiac catheterization, avoid right radial access.   Recommendations: Heart team review, cardiac CTA (TAVR protocol) to evaluate if there is enough aortic valve calcification to perform TAVR as aortic insufficiency appears to be the primary valve lesion.  The patient has a very wide pulse pressure and angiographically has severe AI.    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   Heart murmur    Hemorrhoids    History of kidney cancer    History of kidney stones    Hx of colon cancer, stage I    Hx of nonmelanoma skin cancer    Hx of peptic ulcer    as teenager   Hx of transfusion    Hyperlipidemia     Hypertension    Inguinal hernia    Orthostatic hypotension 01/06/2024   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   PAF (paroxysmal atrial fibrillation) (HCC) 08/20/2023   PONV (postoperative nausea and vomiting)    only when had Ether as Anesthesia drug   Prostate cancer (HCC) 08/15/2012   also Hx colon cancer / Hx skin cancer   Right inguinal hernia 05/08/2016   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   S/P right inguinal hernia repair Sept 2015 05/17/2014   Recurrent right indirect  Hernia treated with robotic TAPP repair with enterolysis Sept 2017   Stroke [facility]) 03/28/2022   Syncope 08/17/2023    Past Surgical History:  Procedure Laterality Date   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   Procedure: AORTIC VALVE REPLACEMENT WITH INSPIRIS AORTIC VALVE;  Surgeon: Maryjane Mt, MD;  Location: [facility];  Service: Open Heart Surgery;  Laterality: N/A;   cataract surgery  2009  CORONARY ARTERY BYPASS GRAFT N/A 07/05/2023   Procedure: CORONARY ARTERY BYPASS GRAFTING, USING LEFT INTERNAL MAMMARY ARTERY;  Surgeon: Maryjane Mt, MD;  Location: [facility];  Service: Open Heart Surgery;  Laterality: N/A;   CYSTOSCOPY     INGUINAL HERNIA REPAIR Right 05/17/2014   Procedure: RIGHT INGUINAL HERNIA REPAIR ;  Surgeon: Adina Lunger, MD;  Location: [facility] ORS;  Service: General;  Laterality: Right;  With MESH   KNEE SURGERY Right 2012   LEFT HEART CATH AND CORONARY ANGIOGRAPHY N/A 05/03/2023   Procedure: LEFT HEART CATH AND CORONARY ANGIOGRAPHY;  Surgeon: Wonda Sharper, MD;  Location: [facility] INVASIVE CV LAB;  Service: Cardiovascular;  Laterality: N/A;   PACEMAKER IMPLANT N/A 07/17/2023   Procedure: PACEMAKER IMPLANT;  Surgeon: Inocencio Soyla Lunger, MD;  Location: [facility] INVAS[facility] CV LAB;  Service: Cardiovascular;  Laterality: N/A;   PARTIAL COLECTOMY  2005   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   RADIOACTIVE SEED IMPLANT N/A 11/24/2012   Procedure: RADIOACTIVE SEED IMPLANT;  Surgeon: Alm GORMAN Fragmin, MD;  Location[facility];  Service: Urology;  Laterality: N/A;   71seeds implanted  no seeds found in bladder   TEE WITHOUT CARDIOVERSION N/A 07/05/2023   Procedure: TRANSESOPHAGEAL ECHOCARDIOGRAM;  Surgeon: Maryjane Mt, MD;  Location: [facility] OR;  Service: Open Heart Surgery;  Laterality: N/A;   THROAT SURGERY  1940's   as child (age 18)growth that created fissure/corrective surgery   TONSILLECTOMY     age 89   XI ROBOTIC ASSISTED INGUINAL HERNIA REPAIR WITH MESH Right 05/08/2016   Procedure: XI ROBOTIC ASSISTED REPAIR OF RECURRENT RIGHT INGUINAL HERNIA;  Surgeon: Donnice Lunger, MD;  Location: [facility];  Service: General;  Laterality: Right;  With MESH    MEDICATIONS:  acetaminophen  (TYLENOL ) 325 MG tablet   albuterol  (PROVENTIL  HFA;VENTOLIN  HFA) 108 (90 BASE) MCG/ACT inhaler   apixaban  (ELIQUIS ) 5 MG TABS tablet   aspirin  EC 81 MG tablet   budesonide -formoterol  (SYMBICORT) 160-4.5 MCG/ACT inhaler   clopidogrel  (PLAVIX ) 75 MG tablet   cyanocobalamin  (,VITAMIN B-12,) 1000 MCG/ML injection   empagliflozin  (JARDIANCE ) 10 MG TABS tablet   fexofenadine (ALLEGRA) 180 MG tablet   fluticasone  (FLONASE ) 50 MCG/ACT nasal spray   levothyroxine  (SYNTHROID ) 75 MCG tablet   metoprolol  succinate (TOPROL -XL) 25 MG 24 hr tablet   midodrine  (PROAMATINE ) 5 MG tablet   pantoprazole  (PROTONIX ) 20 MG tablet   predniSONE  (DELTASONE ) 50 MG tablet   rosuvastatin  (CRESTOR ) 20 MG tablet   solifenacin (VESICARE) 10 MG tablet   tamsulosin  (FLOMAX ) 0.4 MG CAPS capsule    aspirin  EC tablet 81 mg   pantoprazole  (PROTONIX ) EC tablet 20 mg   He is not currently on prednisone . Last Jardiance  dose planned for 04/07/24.    Isaiah Ruder, PA-C Surgical Short Stay/Anesthesiology [facility] Phone [phone] [facility] Phone [phone] 04/09/2024 10:33 AM

## 2024-04-09 NOTE — Anesthesia Preprocedure Evaluation (Addendum)
 Anesthesia Evaluation  Patient identified by MRN, date of birth, ID band Patient awake    Reviewed: Allergy & Precautions, NPO status , Patient's Chart, lab work & pertinent test results  Airway Mallampati: III  TM Distance: >3 FB Neck ROM: Full    Dental no notable dental hx.    Pulmonary asthma    Pulmonary exam normal        Cardiovascular hypertension, Pt. on home beta blockers + CAD and + CABG  Normal cardiovascular exam+ dysrhythmias Atrial Fibrillation + pacemaker   S/p AVR  ECHO: 1. Left ventricular ejection fraction, by estimation, is 70 to 75% . The left ventricle has hyperdynamic function. The left ventricle has no regional wall motion abnormalities. There is mild left ventricular hypertrophy. 2. Right ventricular systolic function is normal. The right ventricular size is normal. Tricuspid regurgitation signal is inadequate for assessing PA pressure. 3. A small pericardial effusion is present. 4. The mitral valve is normal in structure. Trivial mitral valve regurgitation. 5. The aortic valve has been repaired/ replaced. Aortic valve regurgitation is not visualized. There is a 21 mm Inspiris valve present in the aortic position. Vmax 2. 6 m/ s, MG , EOA 1. 1 cm^ 2, DI 0. 5 6. The inferior vena cava is normal in size with greater than 50% respiratory variability, suggesting right atrial pressure of 3 mmHg.   Neuro/Psych CVA  negative psych ROS   GI/Hepatic Neg liver ROS,GERD  Medicated and Controlled,,  Endo/Other  diabetesHypothyroidism    Renal/GU negative Renal ROS     Musculoskeletal negative musculoskeletal ROS (+)    Abdominal   Peds  Hematology  (+) Blood dyscrasia (Plavix )   Anesthesia Other Findings Occlusion of left carotid artery  Reproductive/Obstetrics                              Anesthesia Physical Anesthesia Plan  ASA: 4  Anesthesia Plan: General   Post-op  Pain Management:    Induction: Intravenous  PONV Risk Score and Plan: 2 and Ondansetron , Dexamethasone  and Treatment may vary due to age or medical condition  Airway Management Planned: Oral ETT  Additional Equipment: Arterial line  Intra-op Plan:   Post-operative Plan: Extubation in OR  Informed Consent: I have reviewed the patients History and Physical, chart, labs and discussed the procedure including the risks, benefits and alternatives for the proposed anesthesia with the patient or authorized representative who has indicated his/her understanding and acceptance.     Dental advisory given  Plan Discussed with: CRNA  Anesthesia Plan Comments: (PAT note written 04/09/2024 by Allison Zelenak, PA-C.  )         Anesthesia Quick Evaluation

## 2024-04-10 ENCOUNTER — Inpatient Hospital Stay (HOSPITAL_COMMUNITY): Admitting: Anesthesiology

## 2024-04-10 ENCOUNTER — Inpatient Hospital Stay (HOSPITAL_COMMUNITY): Admitting: Vascular Surgery

## 2024-04-10 ENCOUNTER — Encounter (HOSPITAL_COMMUNITY)
Admission: RE | Disposition: A | Discharge status: Home / Self Care | Payer: Self-pay | Source: Home / Self Care | Attending: Vascular Surgery

## 2024-04-10 ENCOUNTER — Inpatient Hospital Stay (HOSPITAL_COMMUNITY)
Admission: RE | Admit: 2024-04-10 | Discharge: 2024-04-11 | DRG: 038 | Disposition: A | Discharge status: Home / Self Care | Attending: Vascular Surgery | Admitting: Vascular Surgery

## 2024-04-10 ENCOUNTER — Other Ambulatory Visit: Payer: Self-pay

## 2024-04-10 ENCOUNTER — Encounter (HOSPITAL_COMMUNITY): Payer: Self-pay | Admitting: Vascular Surgery

## 2024-04-10 DIAGNOSIS — Z951 Presence of aortocoronary bypass graft: Secondary | ICD-10-CM

## 2024-04-10 DIAGNOSIS — Z8249 Family history of ischemic heart disease and other diseases of the circulatory system: Secondary | ICD-10-CM | POA: Diagnosis not present

## 2024-04-10 DIAGNOSIS — Z882 Allergy status to sulfonamides status: Secondary | ICD-10-CM | POA: Diagnosis not present

## 2024-04-10 DIAGNOSIS — Z85828 Personal history of other malignant neoplasm of skin: Secondary | ICD-10-CM | POA: Diagnosis not present

## 2024-04-10 DIAGNOSIS — Z85038 Personal history of other malignant neoplasm of large intestine: Secondary | ICD-10-CM | POA: Diagnosis not present

## 2024-04-10 DIAGNOSIS — Z8673 Personal history of transient ischemic attack (TIA), and cerebral infarction without residual deficits: Secondary | ICD-10-CM

## 2024-04-10 DIAGNOSIS — Z8546 Personal history of malignant neoplasm of prostate: Secondary | ICD-10-CM | POA: Diagnosis not present

## 2024-04-10 DIAGNOSIS — I352 Nonrheumatic aortic (valve) stenosis with insufficiency: Secondary | ICD-10-CM | POA: Diagnosis present

## 2024-04-10 DIAGNOSIS — E039 Hypothyroidism, unspecified: Secondary | ICD-10-CM | POA: Diagnosis not present

## 2024-04-10 DIAGNOSIS — Z8042 Family history of malignant neoplasm of prostate: Secondary | ICD-10-CM

## 2024-04-10 DIAGNOSIS — E785 Hyperlipidemia, unspecified: Secondary | ICD-10-CM | POA: Diagnosis present

## 2024-04-10 DIAGNOSIS — I6522 Occlusion and stenosis of left carotid artery: Secondary | ICD-10-CM

## 2024-04-10 DIAGNOSIS — Z952 Presence of prosthetic heart valve: Secondary | ICD-10-CM | POA: Diagnosis not present

## 2024-04-10 DIAGNOSIS — Z8711 Personal history of peptic ulcer disease: Secondary | ICD-10-CM | POA: Diagnosis not present

## 2024-04-10 DIAGNOSIS — Z9049 Acquired absence of other specified parts of digestive tract: Secondary | ICD-10-CM | POA: Diagnosis not present

## 2024-04-10 DIAGNOSIS — R710 Precipitous drop in hematocrit: Secondary | ICD-10-CM | POA: Diagnosis not present

## 2024-04-10 DIAGNOSIS — Z886 Allergy status to analgesic agent status: Secondary | ICD-10-CM | POA: Diagnosis not present

## 2024-04-10 DIAGNOSIS — Z808 Family history of malignant neoplasm of other organs or systems: Secondary | ICD-10-CM

## 2024-04-10 DIAGNOSIS — I2510 Atherosclerotic heart disease of native coronary artery without angina pectoris: Secondary | ICD-10-CM | POA: Diagnosis not present

## 2024-04-10 DIAGNOSIS — Y838 Other surgical procedures as the cause of abnormal reaction of the patient, or of later complication, without mention of misadventure at the time of the procedure: Secondary | ICD-10-CM | POA: Diagnosis not present

## 2024-04-10 DIAGNOSIS — E119 Type 2 diabetes mellitus without complications: Secondary | ICD-10-CM | POA: Diagnosis present

## 2024-04-10 DIAGNOSIS — Z85528 Personal history of other malignant neoplasm of kidney: Secondary | ICD-10-CM

## 2024-04-10 DIAGNOSIS — Z91041 Radiographic dye allergy status: Secondary | ICD-10-CM

## 2024-04-10 DIAGNOSIS — Z888 Allergy status to other drugs, medicaments and biological substances status: Secondary | ICD-10-CM

## 2024-04-10 DIAGNOSIS — I4891 Unspecified atrial fibrillation: Secondary | ICD-10-CM | POA: Diagnosis not present

## 2024-04-10 DIAGNOSIS — L7632 Postprocedural hematoma of skin and subcutaneous tissue following other procedure: Secondary | ICD-10-CM | POA: Diagnosis not present

## 2024-04-10 DIAGNOSIS — I4510 Unspecified right bundle-branch block: Secondary | ICD-10-CM | POA: Diagnosis present

## 2024-04-10 DIAGNOSIS — Z01812 Encounter for preprocedural laboratory examination: Secondary | ICD-10-CM | POA: Diagnosis not present

## 2024-04-10 DIAGNOSIS — I480 Paroxysmal atrial fibrillation: Secondary | ICD-10-CM | POA: Diagnosis present

## 2024-04-10 DIAGNOSIS — K219 Gastro-esophageal reflux disease without esophagitis: Secondary | ICD-10-CM | POA: Diagnosis present

## 2024-04-10 DIAGNOSIS — I10 Essential (primary) hypertension: Secondary | ICD-10-CM | POA: Diagnosis present

## 2024-04-10 DIAGNOSIS — Z87442 Personal history of urinary calculi: Secondary | ICD-10-CM | POA: Diagnosis not present

## 2024-04-10 HISTORY — PX: PATCH ANGIOPLASTY: SHX6230

## 2024-04-10 HISTORY — PX: ENDARTERECTOMY: SHX5162

## 2024-04-10 HISTORY — DX: Occlusion and stenosis of left carotid artery: I65.22

## 2024-04-10 LAB — GLUCOSE, CAPILLARY
Glucose-Capillary: 132 mg/dL — ABNORMAL HIGH (ref 70–99)
Glucose-Capillary: 162 mg/dL — ABNORMAL HIGH (ref 70–99)
Glucose-Capillary: 144 mg/dL — ABNORMAL HIGH (ref 70–99)
Glucose-Capillary: 127 mg/dL — ABNORMAL HIGH (ref 70–99)
Glucose-Capillary: 170 mg/dL — ABNORMAL HIGH (ref 70–99)

## 2024-04-10 SURGERY — ENDARTERECTOMY, CAROTID
Anesthesia: General | Site: Neck | Laterality: Left

## 2024-04-10 MED ORDER — EMPAGLIFLOZIN 10 MG PO TABS
10.0000 mg | ORAL_TABLET | Freq: Every day | ORAL | Status: DC
  Administered 2024-04-11: 10 mg via ORAL
  Filled 2024-04-10: qty 1

## 2024-04-10 MED ORDER — POTASSIUM CHLORIDE CRYS ER 20 MEQ PO TBCR: 40.0000 meq | EXTENDED_RELEASE_TABLET | Freq: Every day | ORAL | Status: DC | PRN

## 2024-04-10 MED ORDER — BISACODYL 5 MG PO TBEC: 5.0000 mg | DELAYED_RELEASE_TABLET | Freq: Every day | ORAL | Status: DC | PRN

## 2024-04-10 MED ORDER — 0.9 % SODIUM CHLORIDE (POUR BTL) OPTIME
TOPICAL | Status: DC | PRN
  Administered 2024-04-10: 1000 mL

## 2024-04-10 MED ORDER — ROCURONIUM BROMIDE 10 MG/ML (PF) SYRINGE
PREFILLED_SYRINGE | INTRAVENOUS | Status: DC | PRN
  Administered 2024-04-10: 50 mg via INTRAVENOUS
  Administered 2024-04-10: 20 mg via INTRAVENOUS

## 2024-04-10 MED ORDER — DEXAMETHASONE SODIUM PHOSPHATE 10 MG/ML IJ SOLN
INTRAMUSCULAR | Status: DC | PRN
  Administered 2024-04-10: 10 mg via INTRAVENOUS

## 2024-04-10 MED ORDER — LACTATED RINGERS IV SOLN: INTRAVENOUS | Status: DC | PRN

## 2024-04-10 MED ORDER — CHLORHEXIDINE GLUCONATE 0.12 % MT SOLN: 15.0000 mL | Freq: Once | OROMUCOSAL | Status: AC

## 2024-04-10 MED ORDER — PROPOFOL 10 MG/ML IV BOLUS
INTRAVENOUS | Status: DC | PRN
  Administered 2024-04-10: 100 mg via INTRAVENOUS

## 2024-04-10 MED ORDER — ONDANSETRON HCL 4 MG/2ML IJ SOLN: 4.0000 mg | Freq: Once | INTRAMUSCULAR | Status: DC | PRN

## 2024-04-10 MED ORDER — ROSUVASTATIN CALCIUM 20 MG PO TABS
20.0000 mg | ORAL_TABLET | Freq: Every day | ORAL | Status: DC
  Administered 2024-04-11: 20 mg via ORAL
  Filled 2024-04-10: qty 1

## 2024-04-10 MED ORDER — FENTANYL CITRATE (PF) 250 MCG/5ML IJ SOLN
INTRAMUSCULAR | Status: AC
  Filled 2024-04-10: qty 5

## 2024-04-10 MED ORDER — FENTANYL CITRATE (PF) 100 MCG/2ML IJ SOLN: 25.0000 ug | INTRAMUSCULAR | Status: DC | PRN

## 2024-04-10 MED ORDER — HEMOSTATIC AGENTS (NO CHARGE) OPTIME
TOPICAL | Status: DC | PRN
  Administered 2024-04-10 (×3): 1 via TOPICAL

## 2024-04-10 MED ORDER — MIDODRINE HCL 5 MG PO TABS
5.0000 mg | ORAL_TABLET | Freq: Three times a day (TID) | ORAL | Status: DC
  Administered 2024-04-10 – 2024-04-11 (×3): 5 mg via ORAL
  Filled 2024-04-10 (×3): qty 1

## 2024-04-10 MED ORDER — ACETAMINOPHEN 325 MG PO TABS
325.0000 mg | ORAL_TABLET | ORAL | Status: DC | PRN
  Administered 2024-04-10: 650 mg via ORAL
  Filled 2024-04-10: qty 2

## 2024-04-10 MED ORDER — HEPARIN SODIUM (PORCINE) 1000 UNIT/ML IJ SOLN
INTRAMUSCULAR | Status: DC | PRN
  Administered 2024-04-10: 2000 [IU] via INTRAVENOUS
  Administered 2024-04-10: 7000 [IU] via INTRAVENOUS
  Administered 2024-04-10: 2000 [IU] via INTRAVENOUS

## 2024-04-10 MED ORDER — FENTANYL CITRATE (PF) 250 MCG/5ML IJ SOLN
INTRAMUSCULAR | Status: DC | PRN
  Administered 2024-04-10: 50 ug via INTRAVENOUS

## 2024-04-10 MED ORDER — MORPHINE SULFATE (PF) 2 MG/ML IV SOLN
2.0000 mg | INTRAVENOUS | Status: DC | PRN
  Administered 2024-04-10: 2 mg via INTRAVENOUS

## 2024-04-10 MED ORDER — OXYCODONE HCL 5 MG PO TABS: 5.0000 mg | ORAL_TABLET | ORAL | Status: DC | PRN

## 2024-04-10 MED ORDER — VANCOMYCIN HCL 1000 MG IV SOLR
1000.0000 mg | Freq: Two times a day (BID) | INTRAVENOUS | Status: AC
  Administered 2024-04-10 – 2024-04-11 (×2): 1000 mg via INTRAVENOUS
  Filled 2024-04-10 (×2): qty 20

## 2024-04-10 MED ORDER — LABETALOL HCL 5 MG/ML IV SOLN
5.0000 mg | Freq: Once | INTRAVENOUS | Status: AC
  Administered 2024-04-10: 5 mg via INTRAVENOUS

## 2024-04-10 MED ORDER — METOPROLOL TARTRATE 5 MG/5ML IV SOLN: 2.5000 mg | INTRAVENOUS | Status: DC | PRN

## 2024-04-10 MED ORDER — CHLORHEXIDINE GLUCONATE CLOTH 2 % EX PADS: 6.0000 | MEDICATED_PAD | Freq: Once | CUTANEOUS | Status: DC

## 2024-04-10 MED ORDER — LABETALOL HCL 5 MG/ML IV SOLN
10.0000 mg | INTRAVENOUS | Status: DC | PRN
  Administered 2024-04-10: 10 mg via INTRAVENOUS
  Filled 2024-04-10: qty 4

## 2024-04-10 MED ORDER — LABETALOL HCL 5 MG/ML IV SOLN
INTRAVENOUS | Status: AC
  Filled 2024-04-10: qty 4

## 2024-04-10 MED ORDER — EPHEDRINE SULFATE-NACL 50-0.9 MG/10ML-% IV SOSY
PREFILLED_SYRINGE | INTRAVENOUS | Status: DC | PRN
  Administered 2024-04-10 (×2): 5 mg via INTRAVENOUS

## 2024-04-10 MED ORDER — HEPARIN 6000 UNIT IRRIGATION SOLUTION
Status: DC | PRN
  Administered 2024-04-10: 1

## 2024-04-10 MED ORDER — CHLORHEXIDINE GLUCONATE 0.12 % MT SOLN
OROMUCOSAL | Status: AC
  Administered 2024-04-10: 15 mL via OROMUCOSAL
  Filled 2024-04-10: qty 15

## 2024-04-10 MED ORDER — ALBUTEROL SULFATE HFA 108 (90 BASE) MCG/ACT IN AERS: 2.0000 | INHALATION_SPRAY | Freq: Four times a day (QID) | RESPIRATORY_TRACT | Status: DC | PRN

## 2024-04-10 MED ORDER — PROPOFOL 10 MG/ML IV BOLUS
INTRAVENOUS | Status: AC
  Filled 2024-04-10: qty 20

## 2024-04-10 MED ORDER — DOCUSATE SODIUM 100 MG PO CAPS
100.0000 mg | ORAL_CAPSULE | Freq: Every day | ORAL | Status: DC
  Administered 2024-04-11: 100 mg via ORAL
  Filled 2024-04-10: qty 1

## 2024-04-10 MED ORDER — VANCOMYCIN HCL IN DEXTROSE 1-5 GM/200ML-% IV SOLN: 1000.0000 mg | Freq: Two times a day (BID) | INTRAVENOUS | Status: DC

## 2024-04-10 MED ORDER — CIPROFLOXACIN IN D5W 400 MG/200ML IV SOLN
400.0000 mg | INTRAVENOUS | Status: AC
  Administered 2024-04-10: 400 mg via INTRAVENOUS
  Filled 2024-04-10: qty 200

## 2024-04-10 MED ORDER — LIDOCAINE-EPINEPHRINE (PF) 1 %-1:200000 IJ SOLN
INTRAMUSCULAR | Status: DC | PRN
Start: 2024-04-10 — End: 2024-04-10
  Administered 2024-04-10: 5.5 mL

## 2024-04-10 MED ORDER — FLUTICASONE FUROATE-VILANTEROL 200-25 MCG/ACT IN AEPB
1.0000 | INHALATION_SPRAY | Freq: Every day | RESPIRATORY_TRACT | Status: DC
  Administered 2024-04-11: 1 via RESPIRATORY_TRACT
  Filled 2024-04-10: qty 28

## 2024-04-10 MED ORDER — AMISULPRIDE (ANTIEMETIC) 5 MG/2ML IV SOLN: 10.0000 mg | Freq: Once | INTRAVENOUS | Status: DC | PRN

## 2024-04-10 MED ORDER — HYDRALAZINE HCL 20 MG/ML IJ SOLN
INTRAMUSCULAR | Status: AC
  Filled 2024-04-10: qty 1

## 2024-04-10 MED ORDER — ONDANSETRON HCL 4 MG/2ML IJ SOLN: 4.0000 mg | Freq: Four times a day (QID) | INTRAMUSCULAR | Status: DC | PRN

## 2024-04-10 MED ORDER — FESOTERODINE FUMARATE ER 4 MG PO TB24
4.0000 mg | ORAL_TABLET | Freq: Every day | ORAL | Status: DC
  Administered 2024-04-11: 4 mg via ORAL
  Filled 2024-04-10: qty 1

## 2024-04-10 MED ORDER — LACTATED RINGERS IV SOLN: INTRAVENOUS | Status: DC

## 2024-04-10 MED ORDER — POLYETHYLENE GLYCOL 3350 17 G PO PACK: 17.0000 g | PACK | Freq: Every day | ORAL | Status: DC | PRN

## 2024-04-10 MED ORDER — SUGAMMADEX SODIUM 200 MG/2ML IV SOLN
INTRAVENOUS | Status: DC | PRN
  Administered 2024-04-10: 139 mg via INTRAVENOUS

## 2024-04-10 MED ORDER — ORAL CARE MOUTH RINSE: 15.0000 mL | Freq: Once | OROMUCOSAL | Status: AC

## 2024-04-10 MED ORDER — CLOPIDOGREL BISULFATE 75 MG PO TABS: 75.0000 mg | ORAL_TABLET | Freq: Once | ORAL | Status: DC

## 2024-04-10 MED ORDER — INSULIN ASPART 100 UNIT/ML IJ SOLN: 0.0000 [IU] | INTRAMUSCULAR | Status: DC | PRN

## 2024-04-10 MED ORDER — INSULIN ASPART 100 UNIT/ML IJ SOLN
0.0000 [IU] | Freq: Three times a day (TID) | INTRAMUSCULAR | Status: DC
  Administered 2024-04-10: 2 [IU] via SUBCUTANEOUS
  Administered 2024-04-11: 5 [IU] via SUBCUTANEOUS
  Administered 2024-04-11: 3 [IU] via SUBCUTANEOUS

## 2024-04-10 MED ORDER — HYDRALAZINE HCL 20 MG/ML IJ SOLN
5.0000 mg | INTRAMUSCULAR | Status: AC | PRN
  Administered 2024-04-10 (×2): 5 mg via INTRAVENOUS

## 2024-04-10 MED ORDER — PHENYLEPHRINE HCL-NACL 20-0.9 MG/250ML-% IV SOLN
INTRAVENOUS | Status: DC | PRN
Start: 2024-04-10 — End: 2024-04-10
  Administered 2024-04-10: 20 ug/min via INTRAVENOUS

## 2024-04-10 MED ORDER — ALBUMIN HUMAN 5 % IV SOLN
INTRAVENOUS | Status: DC | PRN
Start: 2024-04-10 — End: 2024-04-10

## 2024-04-10 MED ORDER — METOPROLOL SUCCINATE ER 25 MG PO TB24
25.0000 mg | ORAL_TABLET | Freq: Every day | ORAL | Status: DC
  Administered 2024-04-11: 25 mg via ORAL
  Filled 2024-04-10: qty 1

## 2024-04-10 MED ORDER — ACETAMINOPHEN 10 MG/ML IV SOLN: 1000.0000 mg | Freq: Once | INTRAVENOUS | Status: DC | PRN

## 2024-04-10 MED ORDER — SODIUM CHLORIDE 0.9 % IV SOLN: INTRAVENOUS | Status: DC

## 2024-04-10 MED ORDER — TAMSULOSIN HCL 0.4 MG PO CAPS
0.4000 mg | ORAL_CAPSULE | Freq: Every day | ORAL | Status: DC
  Administered 2024-04-11: 0.4 mg via ORAL
  Filled 2024-04-10: qty 1

## 2024-04-10 MED ORDER — ONDANSETRON HCL 4 MG/2ML IJ SOLN
INTRAMUSCULAR | Status: DC | PRN
  Administered 2024-04-10 (×2): 4 mg via INTRAVENOUS

## 2024-04-10 MED ORDER — ALBUTEROL SULFATE (2.5 MG/3ML) 0.083% IN NEBU: 2.5000 mg | INHALATION_SOLUTION | Freq: Four times a day (QID) | RESPIRATORY_TRACT | Status: DC | PRN

## 2024-04-10 MED ORDER — PHENOL 1.4 % MT LIQD: 1.0000 | OROMUCOSAL | Status: DC | PRN

## 2024-04-10 MED ORDER — SODIUM CHLORIDE 0.9 % IV SOLN: 500.0000 mL | Freq: Once | INTRAVENOUS | Status: DC | PRN

## 2024-04-10 MED ORDER — CLEVIDIPINE BUTYRATE 0.5 MG/ML IV EMUL
INTRAVENOUS | Status: DC | PRN
Start: 2024-04-10 — End: 2024-04-10
  Administered 2024-04-10: 3 mg/h via INTRAVENOUS

## 2024-04-10 MED ORDER — SODIUM CHLORIDE 0.9 % IV SOLN: INTRAVENOUS | Status: AC

## 2024-04-10 MED ORDER — LABETALOL HCL 5 MG/ML IV SOLN
INTRAVENOUS | Status: DC | PRN
Start: 2024-04-10 — End: 2024-04-10
  Administered 2024-04-10: 10 mg via INTRAVENOUS

## 2024-04-10 MED ORDER — LIDOCAINE HCL (PF) 1 % IJ SOLN
INTRAMUSCULAR | Status: AC
  Filled 2024-04-10: qty 30

## 2024-04-10 MED ORDER — MORPHINE SULFATE (PF) 2 MG/ML IV SOLN
INTRAVENOUS | Status: AC
  Filled 2024-04-10: qty 1

## 2024-04-10 MED ORDER — LIDOCAINE 2% (20 MG/ML) 5 ML SYRINGE
INTRAMUSCULAR | Status: DC | PRN
  Administered 2024-04-10: 60 mg via INTRAVENOUS

## 2024-04-10 MED ORDER — HEPARIN 6000 UNIT IRRIGATION SOLUTION
Status: AC
  Filled 2024-04-10: qty 500

## 2024-04-10 MED ORDER — ACETAMINOPHEN 325 MG RE SUPP: 325.0000 mg | RECTAL | Status: DC | PRN

## 2024-04-10 MED ORDER — LIDOCAINE-EPINEPHRINE (PF) 1 %-1:200000 IJ SOLN
INTRAMUSCULAR | Status: AC
  Filled 2024-04-10: qty 30

## 2024-04-10 MED ORDER — LEVOTHYROXINE SODIUM 75 MCG PO TABS
75.0000 ug | ORAL_TABLET | Freq: Every day | ORAL | Status: DC
  Administered 2024-04-11: 75 ug via ORAL
  Filled 2024-04-10: qty 1

## 2024-04-10 MED ORDER — PROTAMINE SULFATE 10 MG/ML IV SOLN
INTRAVENOUS | Status: DC | PRN
  Administered 2024-04-10: 20 mg via INTRAVENOUS
  Administered 2024-04-10: 30 mg via INTRAVENOUS

## 2024-04-10 MED ORDER — ASPIRIN 81 MG PO TBEC
81.0000 mg | DELAYED_RELEASE_TABLET | Freq: Every day | ORAL | Status: DC
  Administered 2024-04-11: 81 mg via ORAL
  Filled 2024-04-10: qty 1

## 2024-04-10 SURGICAL SUPPLY — 50 items
BAG COUNTER SPONGE SURGICOUNT (BAG) ×1 IMPLANT
CANISTER SUCTION 3000ML PPV (SUCTIONS) ×1 IMPLANT
CANNULA VESSEL 3MM 2 BLNT TIP (CANNULA) ×1 IMPLANT
CATH ROBINSON RED A/P 18FR (CATHETERS) ×1 IMPLANT
CLIP TI MEDIUM 24 (CLIP) ×1 IMPLANT
CLIP TI MEDIUM 6 (CLIP) ×1 IMPLANT
CLIP TI WIDE RED SMALL 24 (CLIP) ×1 IMPLANT
CLIP TI WIDE RED SMALL 6 (CLIP) ×1 IMPLANT
COVER PROBE W GEL 5X96 (DRAPES) ×1 IMPLANT
COVER SURGIBOOT TRANSDUCER 6 (DISPOSABLE) IMPLANT
COVER TRANSDUCER ULTRASND GEL (DISPOSABLE) ×1 IMPLANT
DERMABOND ADVANCED .7 DNX12 (GAUZE/BANDAGES/DRESSINGS) ×1 IMPLANT
DRAIN CHANNEL 15F RND FF W/TCR (WOUND CARE) IMPLANT
ELECTRODE REM PT RTRN 9FT ADLT (ELECTROSURGICAL) ×1 IMPLANT
EVACUATOR SILICONE 100CC (DRAIN) IMPLANT
GLOVE BIOGEL PI IND STRL 8 (GLOVE) ×1 IMPLANT
GOWN STRL REUS W/ TWL LRG LVL3 (GOWN DISPOSABLE) ×2 IMPLANT
GOWN STRL REUS W/TWL 2XL LVL3 (GOWN DISPOSABLE) ×2 IMPLANT
HEMOSTAT SNOW SURGICEL 2X4 (HEMOSTASIS) IMPLANT
KIT BASIN OR (CUSTOM PROCEDURE TRAY) ×1 IMPLANT
KIT SHUNT ARGYLE CAROTID ART 6 (VASCULAR PRODUCTS) IMPLANT
KIT TURNOVER KIT B (KITS) ×1 IMPLANT
LOOP VESSEL MINI RED (MISCELLANEOUS) IMPLANT
NDL HYPO 25GX1X1/2 BEV (NEEDLE) IMPLANT
NDL SPNL 20GX3.5 QUINCKE YW (NEEDLE) IMPLANT
NEEDLE HYPO 25GX1X1/2 BEV (NEEDLE) IMPLANT
NEEDLE SPNL 20GX3.5 QUINCKE YW (NEEDLE) IMPLANT
NS IRRIG 1000ML POUR BTL (IV SOLUTION) ×3 IMPLANT
PACK CAROTID (CUSTOM PROCEDURE TRAY) ×1 IMPLANT
PAD ARMBOARD POSITIONER FOAM (MISCELLANEOUS) ×2 IMPLANT
PATCH VASC XENOSURE 1X6 (Vascular Products) IMPLANT
POSITIONER HEAD DONUT 9IN (MISCELLANEOUS) ×1 IMPLANT
SET WALTER ACTIVATION W/DRAPE (SET/KITS/TRAYS/PACK) ×1 IMPLANT
SHUNT CAROTID BYPASS 10 (VASCULAR PRODUCTS) IMPLANT
SHUNT CAROTID BYPASS 12FRX15.5 (VASCULAR PRODUCTS) IMPLANT
SPONGE SURGIFOAM ABS GEL 100 (HEMOSTASIS) IMPLANT
STOPCOCK 4 WAY LG BORE MALE ST (IV SETS) IMPLANT
SURGIFLO W/THROMBIN 8M KIT (HEMOSTASIS) IMPLANT
SUT ETHILON 3 0 PS 1 (SUTURE) IMPLANT
SUT MNCRL AB 4-0 PS2 18 (SUTURE) ×1 IMPLANT
SUT PROLENE 6 0 BV (SUTURE) ×1 IMPLANT
SUT PROLENE 7 0 BV1 MDA (SUTURE) IMPLANT
SUT SILK 3-0 18XBRD TIE 12 (SUTURE) IMPLANT
SUT VIC AB 2-0 CT1 TAPERPNT 27 (SUTURE) ×1 IMPLANT
SUT VIC AB 3-0 SH 27X BRD (SUTURE) ×1 IMPLANT
SYR 20CC LL (SYRINGE) ×2 IMPLANT
SYR CONTROL 10ML LL (SYRINGE) IMPLANT
TAPE UMBILICAL 1/8X30 (MISCELLANEOUS) IMPLANT
TOWEL GREEN STERILE (TOWEL DISPOSABLE) ×1 IMPLANT
WATER STERILE IRR 1000ML POUR (IV SOLUTION) ×1 IMPLANT

## 2024-04-10 NOTE — Op Note (Signed)
 NAME: Bruce Nunez    MRN: [id number] DOB: 02-09-1941    DATE OF OPERATION: 04/10/2024  PREOP DIAGNOSIS:    Left symptomatic ICA stenosis  POSTOP DIAGNOSIS:    Same  PROCEDURE:    Left carotid endarterectomy with bovine pericardial patch plasty  SURGEON: Fonda FORBES Rim  ASSIST: Sherrilee Holster, PA  ANESTHESIA: General  EBL: 50 mL  INDICATIONS:    Bruce Nunez is a 83 y.o. male with symptomatic ICA stenosis confirmed with MRI.  After discussing risks and benefits of left carotid endarterectomy in an effort to decrease risk of future stroke, Bruce Nunez elected to proceed.  FINDINGS:   Mixed atheromatous plaque with thrombus.  Stenosis greater than 80%  TECHNIQUE:   After full informed written consent was obtained from the patient, the patient was brought back to the operating room and placed supine upon the operating table.  Prior to induction, the patient received IV antibiotics.  After obtaining adequate anesthesia, the patient was placed into semi-Fowler position with a shoulder roll in place and the patient's neck slightly hyperextended and rotated away from the surgical site.  The patient was prepped in the standard fashion for a left carotid endarterectomy.    I made an incision anterior to the sternocleidomastoid muscle and dissected down through the subcutaneous tissue.  The platysmas was opened with electrocautery.  Then I dissected down to the internal jugular vein.  This was dissected posteriorly until I obtained visualization of the common carotid artery.  This was dissected out and then an umbilical tape was placed around the common carotid artery and I loosely applied a Rumel tourniquet.  I then dissected in a periadventitial fashion along the common carotid artery up to the bifurcation.  I then identified the external carotid artery and the superior thyroid  artery.  A 2-0 silk tie was looped around the superior thyroid  artery, and I also dissected out the external  carotid artery and placed a vessel loop around it.  In continuing the dissection to the internal carotid artery, I identified the facial vein.  This was ligated and then transected, giving me improved exposure of the internal carotid artery.  In the process of this dissection, the hypoglossal nerve was identified.  I then dissected out the internal carotid artery until I identified an area of soft tissue in the internal carotid artery.  I dissected slightly distal to this area, and placed a vessel loop around the artery and loosely applied a Rumel tourniquet.  At this point, we gave the patient a therapeutic bolus of Heparin  intravenously (roughly 100 units/kg) and more to an ACT greater than 250.    Then, I clamped the internal carotid artery, external carotid artery and then the common carotid artery.  I then made an arteriotomy in the common carotid artery with a 11 blade, and extended the arteriotomy with a Potts scissor down into the common carotid artery, then I carried the arteriotomy through the bifurcation into the internal carotid artery until I reached an area that was not diseased.  At this point, the internal carotid artery was backbled.  There was good, pulsatile backbleeding, therefore I elected not to shunt.  At this point, I started the endarterectomy in the common carotid artery with a Cytogeneticist and carried this dissection down into the common carotid artery circumferentially.  Then I transected the plaque at a segment where it was adherent.  I then carried this dissection up into the external carotid artery.  The  plaque was extracted by unclamping the external carotid artery and everting the artery.  The dissection was then carried into the internal carotid artery, extracting the remaining portion of the carotid plaque.  I passed the plaque off the field as a specimen.  I then spent the next 30 minutes removing intimal flaps and loose debris.    Eventually I reached the point where the  residual plaque was densely adherent and any further dissection would compromise the integrity of the wall.  A tacking suture was placed distally in the 6 o'clock position. After verifying that there was no more loose intimal flaps or debris, I re-interrogated the entirety of this carotid artery.  At this point, I was satisfied that the minimal remaining disease was densely adherent to the wall and wall integrity was intact.  At this point, I then fashioned a bovine pericardial patch for the geometry of this artery and sewed it in place with a running stitch of 6-0 Prolene, one from each end.    Prior to completing this patch angioplasty, all arteries were backbled individually. Then I instilled copious amount of heparinized saline in this patched artery and then completed the patch angioplasty in the usual fashion.  First, I released the clamp on the external carotid artery, then I released it on the common carotid artery.  After waiting a few seconds, I then released it on the internal carotid artery.  I then interrogated this patient's arteries with the continuous Doppler.  The audible waveforms in each artery were consistent with the expected characteristics for each artery.  The Sonosite probe was then sterilely draped and used to interrogate the carotid artery in both longitudinal and transverse views.  At this point, I washed out the wound, and placed thrombin product throughout.  I also gave the patient 50 mg of protamine  to reverse his anticoagulation.   After waiting a few minutes, I removed the thrombin and Gelfoam and washed out the wound.  There was no more active bleeding in the surgical site.  I elected to place a 47 [place] JP drain drain as he was on aspirin  and Plavix  preoperatively.  I then reapproximated the platysma muscle with a running stitch of 3-0 Vicryl.  The skin was then reapproximated with a running subcuticular 4-0 Monocryl stitch.  The skin was then cleaned, dried and Dermabond was  used to reinforce the skin closure.  The patient woke without any problems, neurologically intact.      Given the complexity of the case,  the assistant was necessary in order to expedient the procedure and safely perform the technical aspects of the operation.  The assistant provided traction and countertraction to assist with exposure of the common carotid artery, external carotid artery, and internal carotid artery.  They also assisted with suture ligatures and dividing the facial vein and multiple small venous branches tethering the hypoglossal nerve. The assistant also played a critical role in placing the shunt safely.  In addition they were necessary to provide adequate traction and countertraction to perform a precise endarterectomy and precise closure.  These skills, especially following the Prolene suture for the anastomosis, could not have been adequately performed by a scrub tech assistant.    Fonda FORBES Rim, MD [facility] DATE OF DICTATION:   04/10/2024

## 2024-04-10 NOTE — Progress Notes (Signed)
 Patient arrived from PACU patient alert and oriented x 4 , CCMD notified, CHG bath given, left neck incision level1 MD aware, NIH zero.  Bruce Nunez   04/10/24 1405  Vitals  Temp 98 F (36.7 C)  Temp Source Oral  BP (!) 153/62  MAP (mmHg) 88  BP Location Right Arm  BP Method Automatic  Patient Position (if appropriate) Lying  Pulse Rate 95  Pulse Rate Source Monitor  ECG Heart Rate 94  Resp 12  Level of Consciousness  Level of Consciousness Alert  MEWS COLOR  MEWS Score Color Green  Oxygen  Therapy  SpO2 99 %  O2 Device Room Air  Art Line  Arterial Line BP 196/57  Arterial Line MAP (mmHg) 114 mmHg  Pain Assessment  Pain Scale 0-10  Pain Score 2  Pain Type Surgical pain  Pain Location Neck  Pain Orientation Left  MEWS Score  MEWS Temp 0  MEWS Systolic 0  MEWS Pulse 0  MEWS RR 1  MEWS LOC 0  MEWS Score 1

## 2024-04-10 NOTE — Transfer of Care (Signed)
 Immediate Anesthesia Transfer of Care Note  Patient: Bruce Nunez  Procedure(s) Performed: LEFT CAROTID ENDARTERECTOMY (Left: Neck) PATCH ANGIOPLASTY, USING XENOSURE BIOLOGIC PATCH (Left: Neck)  Patient Location: PACU  Anesthesia Type:General  Level of Consciousness: awake, alert , and oriented  Airway & Oxygen  Therapy: Patient Spontanous Breathing  Post-op Assessment: Report given to RN, Post -op Vital signs reviewed and stable, Patient moving all extremities X 4, and Patient able to stick tongue midline  Post vital signs: Reviewed and stable  Last Vitals:  Vitals Value Taken Time  BP 120/66 04/10/24 10:30  Temp 36.4 C 04/10/24 10:26  Pulse 80 04/10/24 10:33  Resp 18 04/10/24 10:33  SpO2 94 % 04/10/24 10:33  Vitals shown include unfiled device data.  Last Pain:  Vitals:   04/10/24 0608  TempSrc:   PainSc: 0-No pain         Complications: No notable events documented.

## 2024-04-10 NOTE — Anesthesia Procedure Notes (Signed)
 Procedure Name: Intubation Date/Time: 04/10/2024 7:40 AM  Performed by: Zelphia Norleen HERO, CRNAPre-anesthesia Checklist: Patient identified, Emergency Drugs available, Suction available and Patient being monitored Patient Re-evaluated:Patient Re-evaluated prior to induction Oxygen  Delivery Method: Circle system utilized Preoxygenation: Pre-oxygenation with 100% oxygen  Induction Type: IV induction Ventilation: Mask ventilation without difficulty Laryngoscope Size: Mac and 3 Grade View: Grade I Tube type: Oral Tube size: 7.0 mm Number of attempts: 1 Airway Equipment and Method: Stylet and Oral airway Placement Confirmation: ETT inserted through vocal cords under direct vision, positive ETCO2 and breath sounds checked- equal and bilateral Secured at: 22 cm Tube secured with: Tape Dental Injury: Teeth and Oropharynx as per pre-operative assessment

## 2024-04-10 NOTE — Discharge Instructions (Addendum)
   [facility]  Discharge Instructions   Carotid Surgery  Please refer to the following instructions for your post-procedure care. Your surgeon or physician assistant will discuss any changes with you.  Activity  You are encouraged to walk as much as you can. You can slowly return to normal activities but must avoid strenuous activity and heavy lifting until your doctor tell you it's okay. Avoid activities such as vacuuming or swinging a golf club. You can drive after one week if you are comfortable and you are no longer taking prescription pain medications. It is normal to feel tired for serval weeks after your surgery. It is also normal to have difficulty with sleep habits, eating, and bowel movements after surgery. These will go away with time.  Bathing/Showering  Shower daily after you go home. Do not soak in a bathtub, hot tub, or swim until the incision heals completely.  Incision Care  Shower every day. Clean your incision with mild soap and water . Pat the area dry with a clean towel. You do not need a bandage unless otherwise instructed. Do not apply any ointments or creams to your incision. You may have skin glue on your incision. Do not peel it off. It will come off on its own in about one week. Your incision may feel thickened and raised for several weeks after your surgery. This is normal and the skin will soften over time.   For Men Only: It's okay to shave around the incision but do not shave the incision itself for 2 weeks. It is common to have numbness under your chin that could last for several months.  Diet  Resume your normal diet. There are no special food restrictions following this procedure. A low fat/low cholesterol diet is recommended for all patients with vascular disease. In order to heal from your surgery, it is CRITICAL to get adequate nutrition. Your body requires vitamins, minerals, and protein. Vegetables are the best source of  vitamins and minerals. Vegetables also provide the perfect balance of protein. Processed food has little nutritional value, so try to avoid this.  Medications  Resume taking all of your medications unless your doctor or physician assistant tells you not to. If your incision is causing pain, you may take over-the- counter pain relievers such as acetaminophen  (Tylenol ). If you were prescribed a stronger pain medication, please be aware these medications can cause nausea and constipation. Prevent nausea by taking the medication with a snack or meal. Avoid constipation by drinking plenty of fluids and eating foods with a high amount of fiber, such as fruits, vegetables, and grains.  Do not take Tylenol  if you are taking prescription pain medications.  Resume your Eliquis  on Thursday, 04/16/2024  Follow Up  Our office will schedule a follow up appointment 2-3 weeks following discharge.  Please call us  immediately for any of the following conditions  Increased pain, redness, drainage (pus) from your incision site. Fever of 101 degrees or higher. If you should develop stroke (slurred speech, difficulty swallowing, weakness on one side of your body, loss of vision) you should call 911 and go to the nearest emergency room.  Reduce your risk of vascular disease:  Stop smoking. If you would like help call [facility] at 1-800-QUIT-NOW (475-413-2840) or [place] at (928)074-7619. Manage your cholesterol Maintain a desired weight Control your diabetes Keep your blood pressure down  If you have any questions, please call the office at [phone].

## 2024-04-10 NOTE — Anesthesia Procedure Notes (Addendum)
 Arterial Line Insertion Start/End8/15/2025 7:00 AM, 04/10/2024 7:10 AM Performed by: Zelphia Norleen HERO, CRNA, CRNA  Patient location: Pre-op. Preanesthetic checklist: patient identified, IV checked, site marked, risks and benefits discussed, surgical consent, monitors and equipment checked, pre-op evaluation, timeout performed and anesthesia consent Lidocaine  1% used for infiltration Right, radial was placed Catheter size: 20 G Hand hygiene performed  and maximum sterile barriers used   Attempts: 1 Procedure performed using ultrasound guided technique. Ultrasound Notes:anatomy identified, needle tip was noted to be adjacent to the nerve/plexus identified and no ultrasound evidence of intravascular and/or intraneural injection Following insertion, dressing applied and Biopatch. Post procedure assessment: normal and unchanged  Patient tolerated the procedure well with no immediate complications.

## 2024-04-10 NOTE — Progress Notes (Deleted)
 Office Note    Patient seen and examined in preop holding.  No complaints. No changes to medication history or physical exam since last seen in clinic. After discussing the risks and benefits of left CEA for asymptomatic carotid disease, Bruce Nunez elected to proceed.   [name]   HPI: Bruce Nunez is a 83 y.o. 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  Patient was seen in 2023 and 2020 for due to concern for stroke.  CT demonstrated no concerns.  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  CTA demonstrated no concern for flow-limiting stenosis in the right internal carotid artery.  Left internal carotid artery demonstrated significant stenosis, 65 to 70%.  He presents today after cardiac clearance.  On exam, Bruce Nunez was doing well.  Accompanied by his wife and son.  He states that since last seen, he has had 2 episodes of right-sided head tingling that continued into the arm.  This resolved after several minutes.  Denied     Recent surgical history includes CABG, aortic valve replacement, pacemaker, completed in November 2024 by Dr. 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   Heart murmur    Hemorrhoids    History of kidney cancer    History of kidney stones    Hx of colon cancer, stage I    Hx of nonmelanoma skin cancer    Hx of peptic ulcer    as teenager   Hx of transfusion    Hyperlipidemia    Hypertension    Inguinal hernia    Orthostatic hypotension 01/06/2024   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   PAF (paroxysmal atrial fibrillation) (HCC) 08/20/2023   PONV (postoperative nausea and vomiting)    only when had Ether as Anesthesia drug   Prostate cancer (HCC) 08/15/2012   also Hx colon cancer / Hx skin cancer   Right inguinal hernia 05/08/2016   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) 03/28/2022   Syncope 08/17/2023    Past Surgical History:  Procedure Laterality Date   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   Procedure: AORTIC VALVE REPLACEMENT WITH INSPIRIS AORTIC VALVE;  Surgeon: Maryjane Mt, MD;  Location: [facility] OR;  Service: Open Heart Surgery;  Laterality: N/A;   cataract surgery  2009   CORONARY ARTERY BYPASS GRAFT N/A 07/05/2023   Procedure: CORONARY ARTERY BYPASS GRAFTING, USING LEFT INTERNAL MAMMARY ARTERY;  Surgeon: Maryjane Mt, MD;  Location: [facility] OR;  Service: Open  Heart Surgery;  Laterality: N/A;   CYSTOSCOPY     INGUINAL HERNIA REPAIR Right 05/17/2014   Procedure: RIGHT INGUINAL HERNIA REPAIR ;  Surgeon: Adina Lunger, MD;  Location: [facility] ORS;  Service: General;  Laterality: Right;  With MESH   KNEE SURGERY Right 2012   LEFT HEART CATH AND CORONARY ANGIOGRAPHY N/A 05/03/2023   Procedure: LEFT HEART CATH AND CORONARY ANGIOGRAPHY;  Surgeon: Wonda Sharper, MD;  Location: [facility] INVASIVE CV LAB;  Service: Cardiovascular;  Laterality: N/A;   PACEMAKER IMPLANT N/A 07/17/2023   Procedure: PACEMAKER IMPLANT;  Surgeon: Inocencio Soyla Lunger, MD;   Location: [facility] INVASIVE CV LAB;  Service: Cardiovascular;  Laterality: N/A;   PARTIAL COLECTOMY  2005   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   RADIOACTIVE SEED IMPLANT N/A 11/24/2012   Procedure: RADIOACTIVE SEED IMPLANT;  Surgeon: Alm GORMAN Fragmin, MD;  Location: [facility];  Service: Urology;  Laterality: N/A;   71seeds implanted  no seeds found in bladder   TEE WITHOUT CARDIOVERSION N/A 07/05/2023   Procedure: TRANSESOPHAGEAL ECHOCARDIOGRAM;  Surgeon: Maryjane Mt, MD;  Location: [facility] OR;  Service: Open Heart Surgery;  Laterality: N/A;   THROAT SURGERY  1940's   as child (age 66)growth that created fissure/corrective surgery   TONSILLECTOMY     age 69   XI ROBOTIC ASSISTED INGUINAL HERNIA REPAIR WITH MESH Right 05/08/2016   Procedure: XI ROBOTIC ASSISTED REPAIR OF RECURRENT RIGHT INGUINAL HERNIA;  Surgeon: Donnice Lunger, MD;  Location: [facility] ORS;  Service: General;  Laterality: Right;  With MESH    Social History   Socioeconomic History   Marital status: Married    Spouse name: Not on file   Number of children: 2   Years of education: Not on file   Highest education level: Not on file  Occupational History   Occupation: Retired    Comment: Designer, fashion/clothing  Tobacco Use   Smoking status: Never    Passive exposure: Never   Smokeless tobacco: Never  Vaping Use   Vaping status: Never Used  Substance and Sexual Activity   Alcohol use: No   Drug use: No   Sexual activity: Not on file  Other Topics Concern   Not on file  Social History Narrative   Not on file   Social Drivers of Health   Financial Resource Strain: Not on file  Food Insecurity: No Food Insecurity (08/17/2023)   Hunger Vital Sign    Worried About Running Out of Food in the Last Year: Never true    Ran Out of Food in the Last Year: Never true  Transportation Needs: No Transportation Needs (08/17/2023)   PRAPARE - Administrator, Civil Service (Medical): No    Lack of Transportation  (Non-Medical): No  Physical Activity: Not on file  Stress: Not on file  Social Connections: Not on file  Intimate Partner Violence: Not At Risk (08/17/2023)   Humiliation, Afraid, Rape, and Kick questionnaire    Fear of Current or Ex-Partner: No    Emotionally Abused: No    Physically Abused: No    Sexually Abused: No   Family History  Problem Relation Age of Onset   Heart attack Other    Brain cancer Other    Coronary artery disease Other    Prostate cancer Other     Current Facility-Administered Medications  Medication Dose Route Frequency Provider Last Rate Last Admin   0.9 %  sodium chloride  infusion   Intravenous Continuous Shanin Szymanowski E,  MD       Chlorhexidine  Gluconate Cloth 2 % PADS 6 each  6 each Topical Once Currie Dennin E, MD       And   Chlorhexidine  Gluconate Cloth 2 % PADS 6 each  6 each Topical Once Martese Vanatta E, MD       ciprofloxacin  (CIPRO ) IVPB 400 mg  400 mg Intravenous 60 min Pre-Op Lanis Fonda BRAVO, MD       insulin  aspart (novoLOG ) injection 0-7 Units  0-7 Units Subcutaneous Q2H PRN Ellender, Bernardino SQUIBB, MD       lactated ringers  infusion   Intravenous Continuous Ellender, Bernardino SQUIBB, MD 10 mL/hr at 04/10/24 0635 New Bag at 04/10/24 9364    Allergies  Allergen Reactions   Penicillins Anaphylaxis, Shortness Of Breath, Itching and Swelling    Throat swells up but no intubation per wife   Iodinated Contrast Media Hives and Rash   Asa [Aspirin ] Other (See Comments)    Hx of GI bleeds   Metformin And Related Diarrhea   Monodox [Doxycycline Hyclate] Nausea Only   Sulfa Antibiotics Itching and Swelling   Ultram [Tramadol] Nausea And Vomiting     REVIEW OF SYSTEMS:  [X]  denotes positive finding, [ ]  denotes negative finding Cardiac  Comments:  Chest pain or chest pressure:    Shortness of breath upon exertion:    Short of breath when lying flat:    Irregular heart rhythm:        Vascular    Pain in calf, thigh, or hip brought on by ambulation:     Pain in feet at night that wakes you up from your sleep:     Blood clot in your veins:    Leg swelling:         Pulmonary    Oxygen  at home:    Productive cough:     Wheezing:         Neurologic    Sudden weakness in arms or legs:     Sudden numbness in arms or legs:     Sudden onset of difficulty speaking or slurred speech:    Temporary loss of vision in one eye:     Problems with dizziness:         Gastrointestinal    Blood in stool:     Vomited blood:         Genitourinary    Burning when urinating:     Blood in urine:        Psychiatric    Major depression:         Hematologic    Bleeding problems:    Problems with blood clotting too easily:        Skin    Rashes or ulcers:        Constitutional    Fever or chills:      PHYSICAL EXAMINATION:  Vitals:   04/10/24 0557  BP: (!) 170/80  Pulse: 72  Resp: 18  Temp: (!) 97.4 F (36.3 C)  TempSrc: Oral  SpO2: 98%  Weight: 69.5 kg  Height: 5' 7 (1.702 m)    General:  WDWN in NAD; vital signs documented above Gait: Not observed HENT: WNL, normocephalic Pulmonary: normal non-labored breathing , without wheezing Cardiac: regular HR Abdomen: soft, NT, no masses Skin: without rashes Vascular Exam/Pulses:  Right Left  Radial 2+ (normal) 2+ (normal)  Ulnar    Femoral    Popliteal    DP 2+ (normal) 2+ (normal)  PT  Extremities: without ischemic changes, without Gangrene , without cellulitis; without open wounds;  Musculoskeletal: no muscle wasting or atrophy  Neurologic: A&O X 3;  No focal weakness or paresthesias are detected Psychiatric:  The pt has Normal affect.   Non-Invasive Vascular Imaging:     Right Carotid Findings:  +----------+--------+--------+--------+------------------+-----------------  ----+           PSV cm/sEDV cm/sStenosisPlaque DescriptionComments                +----------+--------+--------+--------+------------------+-----------------  ----+  CCA Prox  141      12                                                        +----------+--------+--------+--------+------------------+-----------------  ----+  CCA Distal138     17                                intimal  thickening     +----------+--------+--------+--------+------------------+-----------------  ----+  ICA Prox  171     20      40-59%  heterogenous      stenosis based  on PSV                                                      and plaque  formation   +----------+--------+--------+--------+------------------+-----------------  ----+  ICA Distal167     27                                                        +----------+--------+--------+--------+------------------+-----------------  ----+  ECA      125     0               heterogenous                              +----------+--------+--------+--------+------------------+-----------------  ----+   +----------+--------+-------+----------------+-------------------+           PSV cm/sEDV cmsDescribe        Arm Pressure (mmHG)  +----------+--------+-------+----------------+-------------------+  [id number]    0      Multiphasic, [id number]                  +----------+--------+-------+----------------+-------------------+   +---------+--------+--+--------+-+---------+  VertebralPSV cm/s42EDV cm/s8Antegrade  +---------+--------+--+--------+-+---------+      Left Carotid Findings:  +----------+--------+--------+--------+------------------+--------+           PSV cm/sEDV cm/sStenosisPlaque DescriptionComments  +----------+--------+--------+--------+------------------+--------+  CCA Prox  79      0       <50%    heterogenous                +----------+--------+--------+--------+------------------+--------+  CCA Distal68      12                                          +----------+--------+--------+--------+------------------+--------+  ICA Prox  518      124     80-99%  heterogenous                +----------+--------+--------+--------+------------------+--------+  ICA Mid   127     0                                           +----------+--------+--------+--------+------------------+--------+  ICA Distal40      0                                           +----------+--------+--------+--------+------------------+--------+  ECA      124     0               heterogenous                +----------+--------+--------+--------+------------------+--------+   +----------+--------+--------+----------------+-------------------+           PSV cm/sEDV cm/sDescribe        Arm Pressure (mmHG)  +----------+--------+--------+----------------+-------------------+  Subclavian229    0       Multiphasic, [id number]                  +----------+--------+--------+----------------+-------------------+   +---------+--------+--+--------+--+---------+  VertebralPSV cm/s66EDV cm/s15Antegrade  +---------+--------+--+--------+--+---------+         ASSESSMENT/PLAN: Bruce Nunez is a 83 y.o. male presenting with symptomatic left-sided ICA stenosis.  I had a long conversation with Ettore regarding the above, most notably that with continued medical management he has a 26% risk of stroke over the next 2 years.  With surgical intervention, this risk decreases to 9%.  I have evaluated his prior CT scan.  He is not a candidate for transcarotid artery revascularization, but is a candidate for carotid endarterectomy.  We had a long conversation today regarding the risks and benefits.  He has been cleared by cardiology.  Symptoms in the interim sound more like a pinched nerve rather than a stroke.  My plan is for him to transition to dual antiplatelet therapy 5 days prior to his procedure.  He will stop Eliquis  at that time.  We discussed signs and symptoms of stroke, TIA, amaurosis.  I asked him to call 911 should any of these  occur.   Fonda FORBES Rim, MD [facility] [phone]

## 2024-04-10 NOTE — Anesthesia Procedure Notes (Addendum)
 Arterial Line Insertion Start/End8/15/2025 7:40 AM, 04/10/2024 7:45 AM Performed by: Zelphia Norleen HERO, CRNA  Patient location: Pre-op. Preanesthetic checklist: patient identified, IV checked, site marked, risks and benefits discussed, surgical consent, monitors and equipment checked, pre-op evaluation, timeout performed and anesthesia consent Lidocaine  1% used for infiltration Left, radial was placed Catheter size: 20 G Hand hygiene performed  and maximum sterile barriers used   Attempts: 1 Procedure performed using ultrasound guided technique. Ultrasound Notes:anatomy identified, needle tip was noted to be adjacent to the nerve/plexus identified and no ultrasound evidence of intravascular and/or intraneural injection Following insertion, dressing applied and Biopatch. Post procedure assessment: normal and unchanged  Patient tolerated the procedure well with no immediate complications. Additional procedure comments: R aline stopped working. L aline placed.

## 2024-04-10 NOTE — H&P (Signed)
 Office Note    Patient seen and examined in preop holding.  No complaints. No changes to medication history or physical exam since last seen in clinic. After discussing the risks and benefits of left CEA for symptomatic carotid disease, [name] elected to proceed.   [name]   HPI: Bruce Nunez is a 83 y.o. 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  Patient was seen in 2023 and 2020 for due to concern for stroke.  CT demonstrated no concerns.  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  CTA demonstrated no concern for flow-limiting stenosis in the right internal carotid artery.  Left internal carotid artery demonstrated significant stenosis, 65 to 70%.  He presents today after cardiac clearance.  On exam, [name] was doing well.  Accompanied by his wife and son.  He states that since last seen, he has had 2 episodes of right-sided head tingling that continued into the arm.  This resolved after several minutes.  Denied     Recent surgical history includes CABG, aortic valve replacement, pacemaker, completed in November 2024 by Dr. 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   Heart murmur    Hemorrhoids    History of kidney cancer    History of kidney stones    Hx of colon cancer, stage I    Hx of nonmelanoma skin cancer    Hx of peptic ulcer    as teenager   Hx of transfusion    Hyperlipidemia    Hypertension    Inguinal hernia    Orthostatic hypotension 01/06/2024   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   PAF (paroxysmal atrial fibrillation) (HCC) 08/20/2023   PONV (postoperative nausea and vomiting)    only when had Ether as Anesthesia drug   Prostate cancer (HCC) 08/15/2012   also Hx colon cancer / Hx skin cancer   Right inguinal hernia 05/08/2016   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   S/P right inguinal hernia repair Sept 2015 05/17/2014   Recurrent right indirect  Hernia treated with robotic TAPP repair with enterolysis Sept 2017   Stroke [facility]) 03/28/2022   Syncope 08/17/2023    Past Surgical History:  Procedure Laterality Date   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   Procedure: AORTIC VALVE REPLACEMENT WITH INSPIRIS AORTIC VALVE;  Surgeon: Maryjane Mt, MD;  Location: [facility] OR;  Service: Open Heart Surgery;  Laterality: N/A;   cataract surgery  2009   CORONARY ARTERY BYPASS GRAFT N/A 07/05/2023   Procedure: CORONARY ARTERY BYPASS GRAFTING, USING LEFT INTERNAL MAMMARY ARTERY;  Surgeon: Maryjane Mt, MD;  Location: [facility] OR;  Service: Open  Heart Surgery;  Laterality: N/A;   CYSTOSCOPY     INGUINAL HERNIA REPAIR Right 05/17/2014   Procedure: RIGHT INGUINAL HERNIA REPAIR ;  Surgeon: Adina Lunger, MD;  Location: [facility] ORS;  Service: General;  Laterality: Right;  With MESH   KNEE SURGERY Right 2012   LEFT HEART CATH AND CORONARY ANGIOGRAPHY N/A 05/03/2023   Procedure: LEFT HEART CATH AND CORONARY ANGIOGRAPHY;  Surgeon: Wonda Sharper, MD;  Location: [facility] INVASIVE CV LAB;  Service: Cardiovascular;  Laterality: N/A;   PACEMAKER IMPLANT N/A 07/17/2023   Procedure: PACEMAKER IMPLANT;  Surgeon: Inocencio Soyla Lunger, MD;   Location: [facility] INVASIVE CV LAB;  Service: Cardiovascular;  Laterality: N/A;   PARTIAL COLECTOMY  2005   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   RADIOACTIVE SEED IMPLANT N/A 11/24/2012   Procedure: RADIOACTIVE SEED IMPLANT;  Surgeon: Alm GORMAN Fragmin, MD;  Location: [facility];  Service: Urology;  Laterality: N/A;   71seeds implanted  no seeds found in bladder   TEE WITHOUT CARDIOVERSION N/A 07/05/2023   Procedure: TRANSESOPHAGEAL ECHOCARDIOGRAM;  Surgeon: Maryjane Mt, MD;  Location: [facility] OR;  Service: Open Heart Surgery;  Laterality: N/A;   THROAT SURGERY  1940's   as child (age 66)growth that created fissure/corrective surgery   TONSILLECTOMY     age 13   XI ROBOTIC ASSISTED INGUINAL HERNIA REPAIR WITH MESH Right 05/08/2016   Procedure: XI ROBOTIC ASSISTED REPAIR OF RECURRENT RIGHT INGUINAL HERNIA;  Surgeon: Donnice Lunger, MD;  Location: [facility] ORS;  Service: General;  Laterality: Right;  With MESH    Social History   Socioeconomic History   Marital status: Married    Spouse name: Not on file   Number of children: 2   Years of education: Not on file   Highest education level: Not on file  Occupational History   Occupation: Retired    Comment: Designer, fashion/clothing  Tobacco Use   Smoking status: Never    Passive exposure: Never   Smokeless tobacco: Never  Vaping Use   Vaping status: Never Used  Substance and Sexual Activity   Alcohol use: No   Drug use: No   Sexual activity: Not on file  Other Topics Concern   Not on file  Social History Narrative   Not on file   Social Drivers of Health   Financial Resource Strain: Not on file  Food Insecurity: No Food Insecurity (08/17/2023)   Hunger Vital Sign    Worried About Running Out of Food in the Last Year: Never true    Ran Out of Food in the Last Year: Never true  Transportation Needs: No Transportation Needs (08/17/2023)   PRAPARE - Administrator, Civil Service (Medical): No    Lack of Transportation  (Non-Medical): No  Physical Activity: Not on file  Stress: Not on file  Social Connections: Not on file  Intimate Partner Violence: Not At Risk (08/17/2023)   Humiliation, Afraid, Rape, and Kick questionnaire    Fear of Current or Ex-Partner: No    Emotionally Abused: No    Physically Abused: No    Sexually Abused: No   Family History  Problem Relation Age of Onset   Heart attack Other    Brain cancer Other    Coronary artery disease Other    Prostate cancer Other     Current Facility-Administered Medications  Medication Dose Route Frequency Provider Last Rate Last Admin   0.9 %  sodium chloride  infusion   Intravenous Continuous Denesha Brouse E,  MD       acetaminophen  (OFIRMEV ) IV 1,000 mg  1,000 mg Intravenous Once PRN Ellender, Bernardino SQUIBB, MD       amisulpride  (BARHEMSYS ) injection 10 mg  10 mg Intravenous Once PRN Ellender, Bernardino SQUIBB, MD       Chlorhexidine  Gluconate Cloth 2 % PADS 6 each  6 each Topical Once Nichola Cieslinski E, MD       And   Chlorhexidine  Gluconate Cloth 2 % PADS 6 each  6 each Topical Once Gloria Ricardo E, MD       fentaNYL  (SUBLIMAZE ) injection 25-50 mcg  25-50 mcg Intravenous Q5 min PRN Ellender, Bernardino SQUIBB, MD       insulin  aspart (novoLOG ) injection 0-7 Units  0-7 Units Subcutaneous Q2H PRN Ellender, Bernardino SQUIBB, MD       lactated ringers  infusion   Intravenous Continuous Ellender, Bernardino SQUIBB, MD 120 mL/hr at 04/10/24 0720 Continued from Pre-op at 04/10/24 0720   ondansetron  (ZOFRAN ) injection 4 mg  4 mg Intravenous Once PRN Ellender, Bernardino SQUIBB, MD        Allergies  Allergen Reactions   Penicillins Anaphylaxis, Shortness Of Breath, Itching and Swelling    Throat swells up but no intubation per wife   Iodinated Contrast Media Hives and Rash   Dorethia Dux ] Other (See Comments)    Hx of GI bleeds   Metformin And Related Diarrhea   Monodox [Doxycycline Hyclate] Nausea Only   Sulfa Antibiotics Itching and Swelling   Ultram [Tramadol] Nausea And Vomiting     REVIEW OF  SYSTEMS:  [X]  denotes positive finding, [ ]  denotes negative finding Cardiac  Comments:  Chest pain or chest pressure:    Shortness of breath upon exertion:    Short of breath when lying flat:    Irregular heart rhythm:        Vascular    Pain in calf, thigh, or hip brought on by ambulation:    Pain in feet at night that wakes you up from your sleep:     Blood clot in your veins:    Leg swelling:         Pulmonary    Oxygen  at home:    Productive cough:     Wheezing:         Neurologic    Sudden weakness in arms or legs:     Sudden numbness in arms or legs:     Sudden onset of difficulty speaking or slurred speech:    Temporary loss of vision in one eye:     Problems with dizziness:         Gastrointestinal    Blood in stool:     Vomited blood:         Genitourinary    Burning when urinating:     Blood in urine:        Psychiatric    Major depression:         Hematologic    Bleeding problems:    Problems with blood clotting too easily:        Skin    Rashes or ulcers:        Constitutional    Fever or chills:      PHYSICAL EXAMINATION:  Vitals:   04/10/24 1045 04/10/24 1100 04/10/24 1115 04/10/24 1130  BP: (!) 118/99 (!) 116/98 135/82 (!) 146/65  Pulse: 79 85 80 79  Resp: (!) 22 19 18 20   Temp:      TempSrc:  SpO2: 97% 95% 96% 96%  Weight:      Height:        General:  WDWN in NAD; vital signs documented above Gait: Not observed HENT: WNL, normocephalic Pulmonary: normal non-labored breathing , without wheezing Cardiac: regular HR Abdomen: soft, NT, no masses Skin: without rashes Vascular Exam/Pulses:  Right Left  Radial 2+ (normal) 2+ (normal)  Ulnar    Femoral    Popliteal    DP 2+ (normal) 2+ (normal)  PT     Extremities: without ischemic changes, without Gangrene , without cellulitis; without open wounds;  Musculoskeletal: no muscle wasting or atrophy  Neurologic: A&O X 3;  No focal weakness or paresthesias are  detected Psychiatric:  The pt has Normal affect.   Non-Invasive Vascular Imaging:     Right Carotid Findings:  +----------+--------+--------+--------+------------------+-----------------  ----+           PSV cm/sEDV cm/sStenosisPlaque DescriptionComments                +----------+--------+--------+--------+------------------+-----------------  ----+  CCA Prox  141     12                                                        +----------+--------+--------+--------+------------------+-----------------  ----+  CCA Distal138     17                                intimal  thickening     +----------+--------+--------+--------+------------------+-----------------  ----+  ICA Prox  171     20      40-59%  heterogenous      stenosis based  on PSV                                                      and plaque  formation   +----------+--------+--------+--------+------------------+-----------------  ----+  ICA Distal167     27                                                        +----------+--------+--------+--------+------------------+-----------------  ----+  ECA      125     0               heterogenous                              +----------+--------+--------+--------+------------------+-----------------  ----+   +----------+--------+-------+----------------+-------------------+           PSV cm/sEDV cmsDescribe        Arm Pressure (mmHG)  +----------+--------+-------+----------------+-------------------+  [id number]    0      Multiphasic, [id number]                  +----------+--------+-------+----------------+-------------------+   +---------+--------+--+--------+-+---------+  VertebralPSV cm/s42EDV cm/s8Antegrade  +---------+--------+--+--------+-+---------+      Left Carotid Findings:  +----------+--------+--------+--------+------------------+--------+           PSV cm/sEDV cm/sStenosisPlaque  DescriptionComments  +----------+--------+--------+--------+------------------+--------+  CCA Prox  79      0       <50%    heterogenous                +----------+--------+--------+--------+------------------+--------+  CCA Distal68      12                                          +----------+--------+--------+--------+------------------+--------+  ICA Prox  518     124     80-99%  heterogenous                +----------+--------+--------+--------+------------------+--------+  ICA Mid   127     0                                           +----------+--------+--------+--------+------------------+--------+  ICA Distal40      0                                           +----------+--------+--------+--------+------------------+--------+  ECA      124     0               heterogenous                +----------+--------+--------+--------+------------------+--------+   +----------+--------+--------+----------------+-------------------+           PSV cm/sEDV cm/sDescribe        Arm Pressure (mmHG)  +----------+--------+--------+----------------+-------------------+  Subclavian229    0       Multiphasic, [id number]                  +----------+--------+--------+----------------+-------------------+   +---------+--------+--+--------+--+---------+  VertebralPSV cm/s66EDV cm/s15Antegrade  +---------+--------+--+--------+--+---------+         ASSESSMENT/PLAN: YONY ROULSTON is a 83 y.o. male presenting with symptomatic left-sided ICA stenosis.  I had a long conversation with Harim regarding the above, most notably that with continued medical management he has a 26% risk of stroke over the next 2 years.  With surgical intervention, this risk decreases to 9%.  I have evaluated his prior CT scan.  He is not a candidate for transcarotid artery revascularization, but is a candidate for carotid endarterectomy.  We had a long conversation today  regarding the risks and benefits.  He has been cleared by cardiology.  Symptoms in the interim sound more like a pinched nerve rather than a stroke.  My plan is for him to transition to dual antiplatelet therapy 5 days prior to his procedure.  He will stop Eliquis  at that time.  We discussed signs and symptoms of stroke, TIA, amaurosis.  I asked him to call 911 should any of these occur.   Fonda FORBES Rim, MD [facility] [phone]

## 2024-04-11 ENCOUNTER — Other Ambulatory Visit (HOSPITAL_COMMUNITY): Payer: Self-pay

## 2024-04-11 LAB — CBC
HCT: 33.8 % — ABNORMAL LOW (ref 39.0–52.0)
Hemoglobin: 9.9 g/dL — ABNORMAL LOW (ref 13.0–17.0)
MCH: 20 pg — ABNORMAL LOW (ref 26.0–34.0)
MCHC: 29.3 g/dL — ABNORMAL LOW (ref 30.0–36.0)
MCV: 68.3 fL — ABNORMAL LOW (ref 80.0–100.0)
Platelets: 201 K/uL (ref 150–400)
RBC: 4.95 MIL/uL (ref 4.22–5.81)
RDW: 17.7 % — ABNORMAL HIGH (ref 11.5–15.5)
WBC: 8.5 K/uL (ref 4.0–10.5)
nRBC: 0 % (ref 0.0–0.2)

## 2024-04-11 LAB — POCT ACTIVATED CLOTTING TIME
Activated Clotting Time: 245 s
Activated Clotting Time: 256 s
Activated Clotting Time: 273 s
Activated Clotting Time: 141 s

## 2024-04-11 LAB — LIPID PANEL
Cholesterol: 85 mg/dL (ref 0–200)
HDL: 40 mg/dL — ABNORMAL LOW (ref >40)
LDL Cholesterol: 32 mg/dL (ref 0–99)
Total CHOL/HDL Ratio: 2.1 ratio
Triglycerides: 64 mg/dL (ref <150)
VLDL: 13 mg/dL (ref 0–40)

## 2024-04-11 LAB — GLUCOSE, CAPILLARY
Glucose-Capillary: 165 mg/dL — ABNORMAL HIGH (ref 70–99)
Glucose-Capillary: 243 mg/dL — ABNORMAL HIGH (ref 70–99)

## 2024-04-11 LAB — BASIC METABOLIC PANEL WITH GFR
Anion gap: 8 (ref 5–15)
BUN: 24 mg/dL — ABNORMAL HIGH (ref 8–23)
CO2: 22 mmol/L (ref 22–32)
Calcium: 9.1 mg/dL (ref 8.9–10.3)
Chloride: 105 mmol/L (ref 98–111)
Creatinine, Ser: 1.06 mg/dL (ref 0.61–1.24)
GFR, Estimated: >60 mL/min (ref >60)
Glucose, Bld: 163 mg/dL — ABNORMAL HIGH (ref 70–99)
Potassium: 4.3 mmol/L (ref 3.5–5.1)
Sodium: 135 mmol/L (ref 135–145)

## 2024-04-11 MED ORDER — APIXABAN 5 MG PO TABS: 5.0000 mg | ORAL_TABLET | Freq: Two times a day (BID) | ORAL | Status: DC

## 2024-04-11 MED ORDER — APIXABAN 5 MG PO TABS
5.0000 mg | ORAL_TABLET | Freq: Two times a day (BID) | ORAL | Status: AC
Start: 2024-04-16 — End: ?

## 2024-04-11 MED ORDER — OXYCODONE HCL 5 MG PO TABS
5.0000 mg | ORAL_TABLET | ORAL | 0 refills | Status: DC | PRN
  Filled 2024-04-11: qty 15, 2d supply, fill #0

## 2024-04-11 NOTE — Progress Notes (Addendum)
 Progress Note    04/11/2024 8:18 AM 1 Day Post-Op  Subjective: He says that his neck feels much better this morning than yesterday.  He has a little bit of soreness, however this is improved.  He is tolerating a normal diet without swallowing difficulty.  He is mobilizing well without issue.   Vitals:   04/11/24 0547 04/11/24 0805  BP: (!) 151/70 (!) 145/81  Pulse: 76 86  Resp: 15 17  Temp:  98 F (36.7 C)  SpO2: 99% 100%    Physical Exam: General: Standing beside the bed, alert and oriented x 3 Cardiac: Regular Lungs: Nonlabored Incisions: Left-sided neck incision intact and dry with small hematoma. Hematoma is soft and improved from last night.  JP drain with 60 cc output total since surgery Extremities: Moving all extremities equally without sensory or motor deficit Neuro: No slurred speech, no lip droop, no tongue deviation   CBC    Component Value Date/Time   WBC 8.5 04/11/2024 0339   RBC 4.95 04/11/2024 0339   HGB 9.9 (L) 04/11/2024 0339   HGB 10.7 (L) 07/29/2023 1550   HCT 33.8 (L) 04/11/2024 0339   HCT 33.6 (L) 07/29/2023 1550   PLT 201 04/11/2024 0339   PLT 286 07/29/2023 1550   MCV 68.3 (L) 04/11/2024 0339   MCV 84 07/29/2023 1550   MCH 20.0 (L) 04/11/2024 0339   MCHC 29.3 (L) 04/11/2024 0339   RDW 17.7 (H) 04/11/2024 0339   RDW 13.2 07/29/2023 1550   LYMPHSABS 1.4 07/05/2023 1903   MONOABS 0.8 07/05/2023 1903   EOSABS 0.2 07/05/2023 1903   BASOSABS 0.0 07/05/2023 1903    BMET    Component Value Date/Time   NA 135 04/11/2024 0339   NA 142 07/29/2023 1550   K 4.3 04/11/2024 0339   CL 105 04/11/2024 0339   CO2 22 04/11/2024 0339   GLUCOSE 163 (H) 04/11/2024 0339   BUN 24 (H) 04/11/2024 0339   BUN 13 07/29/2023 1550   CREATININE 1.06 04/11/2024 0339   CALCIUM  9.1 04/11/2024 0339   GFRNONAA >60 04/11/2024 0339   GFRAA 79 12/09/2019 0957    INR    Component Value Date/Time   INR 1.0 04/08/2024 1133     Intake/Output Summary (Last 24  hours) at 04/11/2024 0818 Last data filed at 04/11/2024 0606 Gross per 24 hour  Intake 2970 ml  Output 2380 ml  Net 590 ml      Assessment/Plan:  83 y.o. male is 1 day postop, s/p: Left carotid endarterectomy   - Postoperatively the patient developed a small, but soft hematoma in the left neck.  This morning he still has a small, soft hematoma however this is improved from yesterday. -Left-sided JP drain with 60 cc serosanguineous drainage since surgery. 40cc was overnight, likely related to hematoma drainage -Left-sided neck incision is intact and dry.  I have removed his JP drain and placed a dry bandage over the exit site -He is neurologically intact on exam and denies any slurred speech, facial droop, sudden visual changes, or dizziness overnight.  He is ambulating without difficulty.  He is tolerating a normal diet without swallowing difficulty. -Hemoglobin dropped to 9.9 today from 11.9 yesterday.  This is likely due to intraop blood loss.  No indications for transfusion.  No indications of active bleeding -Given the patient's hematoma formation yesterday, we have discontinued his Plavix .  He will continue his aspirin  daily for at least 1 month postop.  He will resume his Eliquis  in  5 days -Likely home past noon today as long as he has no further bleeding issues with drain removal.  We will monitor him for the next few hours prior to discharge to ensure there is no further hematoma formation -Will arrange follow-up with our office in 2 to 3 weeks for his incision check   Ahmed Holster, PA-C [facility] [phone] 04/11/2024 8:18 AM    I have independently interviewed and examined patient and agree with PA assessment and plan above.  He does have a hematoma in the left calf which is stable from my evaluation yesterday evening.  Drain was removed prior to my evaluation today.  We will watch him for couple hours and discharge as long as hematoma remains  stable.  Koston Hennes C. Sheree, MD [facility] [place] Office: [phone] Pager: [phone]

## 2024-04-12 ENCOUNTER — Encounter (HOSPITAL_COMMUNITY): Payer: Self-pay

## 2024-04-12 ENCOUNTER — Emergency Department (HOSPITAL_COMMUNITY)
Admission: EM | Admit: 2024-04-12 | Discharge: 2024-04-12 | Disposition: A | Discharge status: Home / Self Care | Attending: Emergency Medicine | Admitting: Emergency Medicine

## 2024-04-12 ENCOUNTER — Other Ambulatory Visit: Payer: Self-pay

## 2024-04-12 DIAGNOSIS — Z7901 Long term (current) use of anticoagulants: Secondary | ICD-10-CM | POA: Diagnosis not present

## 2024-04-12 DIAGNOSIS — G8918 Other acute postprocedural pain: Secondary | ICD-10-CM | POA: Diagnosis not present

## 2024-04-12 DIAGNOSIS — M542 Cervicalgia: Secondary | ICD-10-CM | POA: Diagnosis not present

## 2024-04-12 DIAGNOSIS — I10 Essential (primary) hypertension: Secondary | ICD-10-CM | POA: Diagnosis not present

## 2024-04-12 LAB — COMPREHENSIVE METABOLIC PANEL WITH GFR
ALT: 17 U/L (ref 0–44)
AST: 28 U/L (ref 15–41)
Albumin: 3.5 g/dL (ref 3.5–5.0)
Alkaline Phosphatase: 54 U/L (ref 38–126)
Anion gap: 9 (ref 5–15)
BUN: 24 mg/dL — ABNORMAL HIGH (ref 8–23)
CO2: 23 mmol/L (ref 22–32)
Calcium: 9.1 mg/dL (ref 8.9–10.3)
Chloride: 104 mmol/L (ref 98–111)
Creatinine, Ser: 1.13 mg/dL (ref 0.61–1.24)
GFR, Estimated: >60 mL/min (ref >60)
Glucose, Bld: 233 mg/dL — ABNORMAL HIGH (ref 70–99)
Potassium: 3.8 mmol/L (ref 3.5–5.1)
Sodium: 136 mmol/L (ref 135–145)
Total Bilirubin: 1.4 mg/dL — ABNORMAL HIGH (ref 0.0–1.2)
Total Protein: 6.3 g/dL — ABNORMAL LOW (ref 6.5–8.1)

## 2024-04-12 LAB — CBC WITH DIFFERENTIAL/PLATELET
Abs Immature Granulocytes: 0.03 K/uL (ref 0.00–0.07)
Basophils Absolute: 0 K/uL (ref 0.0–0.1)
Basophils Relative: 0 %
Eosinophils Absolute: 0 K/uL (ref 0.0–0.5)
Eosinophils Relative: 1 %
HCT: 37.3 % — ABNORMAL LOW (ref 39.0–52.0)
Hemoglobin: 10.4 g/dL — ABNORMAL LOW (ref 13.0–17.0)
Immature Granulocytes: 0 %
Lymphocytes Relative: 22 %
Lymphs Abs: 1.9 K/uL (ref 0.7–4.0)
MCH: 19.9 pg — ABNORMAL LOW (ref 26.0–34.0)
MCHC: 27.9 g/dL — ABNORMAL LOW (ref 30.0–36.0)
MCV: 71.5 fL — ABNORMAL LOW (ref 80.0–100.0)
Monocytes Absolute: 1 K/uL (ref 0.1–1.0)
Monocytes Relative: 12 %
Neutro Abs: 5.4 K/uL (ref 1.7–7.7)
Neutrophils Relative %: 65 %
Platelets: 197 K/uL (ref 150–400)
RBC: 5.22 MIL/uL (ref 4.22–5.81)
RDW: 18.1 % — ABNORMAL HIGH (ref 11.5–15.5)
WBC: 8.4 K/uL (ref 4.0–10.5)
nRBC: 0 % (ref 0.0–0.2)

## 2024-04-12 LAB — I-STAT CG4 LACTIC ACID, ED: Lactic Acid, Venous: 1.8 mmol/L (ref 0.5–1.9)

## 2024-04-12 NOTE — ED Notes (Signed)
 Patient verbalizes understanding of discharge instructions. Opportunity for questioning and answers were provided. Pt discharged from ED.

## 2024-04-12 NOTE — Discharge Instructions (Signed)
 As discussed, your evaluation today has been largely reassuring.  But, it is important that you monitor your condition carefully, and do not hesitate to return to the ED if you develop new, or concerning changes in your condition. ? ?Otherwise, please follow-up with your physician for appropriate ongoing care. ? ?

## 2024-04-12 NOTE — ED Triage Notes (Signed)
 Pt had left carotid endarterectomy 2 days ago. Pt states he went home yesterday and had minimal swelling. Pt woke up this am w/more swelling and tightness at site. Pt denies difficulty breathing, did have some difficulty swallowing tablets this am which is unusual for him.

## 2024-04-12 NOTE — Consult Note (Signed)
 Hospital Consult    Reason for Consult: Left neck hematoma Referring Physician: Dr. Garrick MRN #:  [id number]  History of Present Illness: This is a 83 y.o. male 2 days postoperative from left carotid endarterectomy.  He did have small hematoma initially on Friday evening after surgery and this was evaluated and this was similar yesterday morning.  The JP drain was removed yesterday morning and hematoma remained the same and he was subsequently discharged.  He states that this morning he had approximate baseball sized hematoma in the left neck when he woke.  He has not had any difficulty breathing, talking or swallowing.  Per he and his wife the hematoma is much improved now that he is in the emergency department.  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   Heart murmur    Hemorrhoids    History of kidney cancer    History of kidney stones    Hx of colon cancer, stage I    Hx of nonmelanoma skin cancer    Hx of peptic ulcer    as teenager   Hx of transfusion    Hyperlipidemia    Hypertension    Inguinal hernia    Orthostatic hypotension 01/06/2024   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   PAF (paroxysmal atrial fibrillation) (HCC) 08/20/2023   PONV (postoperative nausea and vomiting)    only when had Ether as Anesthesia drug   Prostate cancer (HCC) 08/15/2012   also Hx colon cancer / Hx skin cancer   Right inguinal hernia 05/08/2016   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   S/P right inguinal hernia repair Sept 2015 05/17/2014   Recurrent right indirect  Hernia treated with robotic TAPP repair with enterolysis Sept 2017   Stroke [facility]) 03/28/2022   Syncope 08/17/2023    Past Surgical History:  Procedure Laterality Date   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   Procedure: AORTIC VALVE REPLACEMENT WITH INSPIRIS AORTIC VALVE;  Surgeon: Maryjane Mt, MD;  Location: [facility] OR;  Service: Open Heart Surgery;  Laterality: N/A;   cataract surgery  2009   CORONARY ARTERY BYPASS GRAFT N/A 07/05/2023   Procedure: CORONARY ARTERY BYPASS GRAFTING, USING LEFT INTERNAL MAMMARY ARTERY;  Surgeon: Maryjane Mt, MD;  Location: [facility] OR;  Service: Open Heart Surgery;  Laterality: N/A;   CYSTOSCOPY     INGUINAL HERNIA REPAIR Right 05/17/2014   Procedure: RIGHT INGUINAL HERNIA REPAIR ;  Surgeon: Adina Lunger, MD;  Location: [facility] ORS;  Service: General;  Laterality: Right;  With MESH   KNEE SURGERY Right 2012   LEFT HEART CATH AND CORONARY ANGIOGRAPHY N/A 05/03/2023   Procedure: LEFT HEART CATH AND CORONARY ANGIOGRAPHY;  Surgeon: Wonda Sharper, MD;  [facility] CV LAB;  Service: Cardiovascular;  Laterality: N/A;   PACEMAKER IMPLANT N/A 07/17/2023   Procedure: PACEMAKER IMPLANT;  Surgeon: Inocencio Soyla Lunger, MD;  Location:  [facility] INVASIVE CV LAB;  Service: Cardiovascular;  Laterality: N/A;   PARTIAL COLECTOMY  2005   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   RADIOACTIVE SEED IMPLANT N/A 11/24/2012   Procedure: RADIOACTIVE SEED IMPLANT;  Surgeon: Alm GORMAN Fragmin, MD;  Location: [facility];  Service: Urology;  Laterality: N/A;   71seeds implanted  no seeds found in bladder   TEE WITHOUT CARDIOVERSION N/A 07/05/2023   Procedure: TRANSESOPHAGEAL ECHOCARDIOGRAM;  Surgeon: Maryjane Mt, MD;  Location: [facility] OR;  Service: Open Heart Surgery;  Laterality: N/A;   THROAT SURGERY  1940's   as child (age 72)growth  that created fissure/corrective surgery   TONSILLECTOMY     age 63   XI ROBOTIC ASSISTED INGUINAL HERNIA REPAIR WITH MESH Right 05/08/2016   Procedure: XI ROBOTIC ASSISTED REPAIR OF RECURRENT RIGHT INGUINAL HERNIA;  Surgeon: Donnice Lunger, MD;  Location: [facility] ORS;  Service: General;  Laterality: Right;  With MESH    Allergies  Allergen Reactions   Penicillins Anaphylaxis, Shortness Of Breath, Itching and Swelling    Throat swells up but no intubation per wife   Iodinated Contrast Media Hives and Rash   Dorethia Arch ] Other (See Comments)    Hx of GI bleeds   Metformin And Related Diarrhea   Monodox [Doxycycline Hyclate] Nausea Only   Sulfa Antibiotics Itching and Swelling   Ultram [Tramadol] Nausea And Vomiting    Prior to Admission medications   Medication Sig Start Date End Date Taking? Authorizing Provider  acetaminophen  (TYLENOL ) 325 MG tablet Take 1-2 tablets (325-650 mg total) by mouth every 6 (six) hours as needed for mild pain (pain score 1-3). 07/19/23   Raguel Con GORMAN, PA-C  albuterol  (PROVENTIL  HFA;VENTOLIN  HFA) 108 (90 BASE) MCG/ACT inhaler Inhale 2 puffs into the lungs every 6 (six) hours as needed for shortness of breath.    [provider]  apixaban  (ELIQUIS ) 5 MG TABS tablet Take 1 tablet (5 mg total) by mouth 2 (two) times daily. 04/16/24   Schuh, McKenzi P, PA-C  budesonide -formoterol  (SYMBICORT) 160-4.5 MCG/ACT inhaler Inhale 2 puffs into the lungs 2 (two) times daily. 03/08/23   [provider]  cyanocobalamin  (,VITAMIN B-12,) 1000 MCG/ML injection Inject 1,000 mcg into the muscle every 30 (thirty) days. 02/04/19   [provider]  empagliflozin  (JARDIANCE ) 10 MG TABS tablet Take 1 tablet (10 mg total) by mouth daily. 08/21/23   Parcells, Waddell LABOR, PA-C  fexofenadine (ALLEGRA) 180 MG tablet Take 180 mg by mouth daily.    [provider]  fluticasone  (FLONASE ) 50 MCG/ACT nasal spray Place 2 sprays into both nostrils 2 (two) times daily.     [provider]  levothyroxine  (SYNTHROID ) 75 MCG tablet Take 75 mcg by mouth daily before breakfast.    [provider]  metoprolol  succinate (TOPROL -XL) 25 MG 24 hr tablet Take 1 tablet (25 mg total) by mouth daily. Take with or immediately following a meal. 12/05/23   Leverne Charlies Helling, PA-C  midodrine  (PROAMATINE ) 5 MG tablet Take 1 tablet (5 mg total) by mouth 3 (three) times daily with meals. Take the third dose of this medication at 3 pm 11/19/23   [name] [name], MD  oxyCODONE  (OXY IR/ROXICODONE ) 5 MG immediate release tablet Take 1-2 tablets (5-10 mg total) by mouth every 4 (four) hours as needed for moderate pain (pain score 4-6). 04/11/24   Schuh, McKenzi P, PA-C  pantoprazole  (PROTONIX ) 20 MG tablet Take 20 mg by mouth daily. 04/06/24   [provider]  rosuvastatin  (CRESTOR ) 20 MG tablet Take 1 tablet (20 mg total) by mouth daily. 01/07/24   Madireddy, Alean SAUNDERS, MD  solifenacin (VESICARE) 10 MG tablet Take 10 mg by mouth daily. 01/17/21   [provider]  tamsulosin  (FLOMAX ) 0.4 MG CAPS capsule Take 1 capsule (0.4 mg total) by mouth daily. 09/09/23   [name] [name], MD    Social History   Socioeconomic History   Marital status: Married    Spouse name: Not on file   Number of children: 2   Years of education: Not on file   Highest education level: Not on file  Occupational History   Occupation: Retired    Comment: Textiles  Tobacco Use   Smoking status: Never    Passive exposure: Never   Smokeless tobacco: Never  Vaping Use   Vaping status: Never Used  Substance and Sexual Activity   Alcohol use: No   Drug use: No   Sexual activity: Not on file  Other Topics Concern   Not on file  Social History Narrative   Not on file   Social Drivers of Health   Financial Resource Strain: Not on file  Food Insecurity: No Food Insecurity (08/17/2023)   Hunger Vital Sign    Worried About Running Out of Food in the Last Year: Never true    Ran  Out of Food in the Last Year: Never true  Transportation Needs: No Transportation Needs (08/17/2023)   PRAPARE - Administrator, Civil Service (Medical): No    Lack of Transportation (Non-Medical): No  Physical Activity: Not on file  Stress: Not on file  Social Connections: Not on file  Intimate Partner Violence: Not At Risk (08/17/2023)   Humiliation, Afraid, Rape, and Kick questionnaire    Fear of Current or Ex-Partner: No    Emotionally Abused: No    Physically Abused: No    Sexually Abused: No     Family History  Problem Relation Age of Onset   Heart attack Other    Brain cancer Other    Coronary artery disease Other    Prostate cancer Other     ROS: Left neck hematoma  Physical Examination  Vitals:   04/12/24 1054 04/12/24 1115  BP: 128/61 (!) 163/75  Pulse: 94 85  Resp: 18 18  Temp: 98.7 F (37.1 C)   SpO2: 99% 98%   Body mass index is 21.46 kg/m.  Awake alert and oriented Nonlabored respirations He is neurologically intact Left neck hematoma actually appears improved from yesterday as pictured below   CBC    Component Value Date/Time   WBC 8.4 04/12/2024 1104   RBC 5.22 04/12/2024 1104   HGB 10.4 (L) 04/12/2024 1104   HGB 10.7 (L) 07/29/2023 1550   HCT 37.3 (L) 04/12/2024 1104   HCT 33.6 (L) 07/29/2023 1550   PLT 197 04/12/2024 1104   PLT 286 07/29/2023 1550   MCV 71.5 (L) 04/12/2024 1104   MCV 84 07/29/2023 1550   MCH 19.9 (L) 04/12/2024 1104   MCHC 27.9 (L) 04/12/2024 1104   RDW 18.1 (H) 04/12/2024 1104   RDW 13.2 07/29/2023 1550   LYMPHSABS 1.9 04/12/2024 1104   MONOABS 1.0 04/12/2024 1104   EOSABS 0.0 04/12/2024 1104   BASOSABS 0.0 04/12/2024 1104    BMET    Component Value Date/Time   NA 136 04/12/2024 1104   NA 142 07/29/2023 1550   K 3.8 04/12/2024 1104   CL 104 04/12/2024 1104  CO2 23 04/12/2024 1104   GLUCOSE 233 (H) 04/12/2024 1104   BUN 24 (H) 04/12/2024 1104   BUN 13 07/29/2023 1550   CREATININE 1.13  04/12/2024 1104   CALCIUM  9.1 04/12/2024 1104   GFRNONAA >60 04/12/2024 1104   GFRAA 79 12/09/2019 0957    COAGS: Lab Results  Component Value Date   INR 1.0 04/08/2024   INR 1.8 (H) 07/05/2023   INR 1.0 07/03/2023     Non-Invasive Vascular Imaging:   No new studies   ASSESSMENT/PLAN: This is a 83 y.o. male status post left carotid endarterectomy with stable hematoma.  As he is mostly asymptomatic and H&H is stable he should be okay for discharge.  I did offer patient and his wife admission for observation although unlikely would change clinical course.  We discussed continuing aspirin  and holding the Plavix  and Eliquis  until 8/21.  They will call if symptoms change.  Bradden Tadros C. Sheree, MD [facility] Office: [phone] Pager: [phone]

## 2024-04-12 NOTE — ED Provider Notes (Signed)
 [place] EMERGENCY DEPARTMENT AT [facility] Provider Note   CSN: [id number] Arrival date & time: 04/12/24  1037     Patient presents with: Post-op Problem   Bruce Nunez is a 83 y.o. male.   HPI Patient presents postop day 2 following left carotid endarterectomy now with concern for swelling and pain about the left neck. Patient was seen yesterday prior to discharge due to hematoma, had his JP drain removed.  Today he awoke with swelling the size of a baseball which has actually improved prior to arrival.  He is able to swallow pills, denies any dyspnea, denies any weakness in his arm, denies any dysarthria.  Symptoms corroborated by his wife at bedside. Patient spoke with his vascular surgeon this morning.    Prior to Admission medications   Medication Sig Start Date End Date Taking? Authorizing Provider  acetaminophen  (TYLENOL ) 325 MG tablet Take 1-2 tablets (325-650 mg total) by mouth every 6 (six) hours as needed for mild pain (pain score 1-3). 07/19/23   Raguel Con RAMAN, PA-C  albuterol  (PROVENTIL  HFA;VENTOLIN  HFA) 108 (90 BASE) MCG/ACT inhaler Inhale 2 puffs into the lungs every 6 (six) hours as needed for shortness of breath.    [provider]  apixaban  (ELIQUIS ) 5 MG TABS tablet Take 1 tablet (5 mg total) by mouth 2 (two) times daily. 04/16/24   Schuh, McKenzi P, PA-C  budesonide -formoterol  (SYMBICORT) 160-4.5 MCG/ACT inhaler Inhale 2 puffs into the lungs 2 (two) times daily. 03/08/23   [provider]  cyanocobalamin  (,VITAMIN B-12,) 1000 MCG/ML injection Inject 1,000 mcg into the muscle every 30 (thirty) days. 02/04/19   [provider]  empagliflozin  (JARDIANCE ) 10 MG TABS tablet Take 1 tablet (10 mg total) by mouth daily. 08/21/23   Parcells, Waddell LABOR, PA-C  fexofenadine (ALLEGRA) 180 MG tablet Take 180 mg by mouth daily.    [provider]  fluticasone  (FLONASE ) 50 MCG/ACT nasal spray Place 2 sprays into both nostrils 2  (two) times daily.    [provider]  levothyroxine  (SYNTHROID ) 75 MCG tablet Take 75 mcg by mouth daily before breakfast.    [provider]  metoprolol  succinate (TOPROL -XL) 25 MG 24 hr tablet Take 1 tablet (25 mg total) by mouth daily. Take with or immediately following a meal. 12/05/23   Leverne Charlies Helling, PA-C  midodrine  (PROAMATINE ) 5 MG tablet Take 1 tablet (5 mg total) by mouth 3 (three) times daily with meals. Take the third dose of this medication at 3 pm 11/19/23   [name] [name], MD  oxyCODONE  (OXY IR/ROXICODONE ) 5 MG immediate release tablet Take 1-2 tablets (5-10 mg total) by mouth every 4 (four) hours as needed for moderate pain (pain score 4-6). 04/11/24   Schuh, McKenzi P, PA-C  pantoprazole  (PROTONIX ) 20 MG tablet Take 20 mg by mouth daily. 04/06/24   [provider]  rosuvastatin  (CRESTOR ) 20 MG tablet Take 1 tablet (20 mg total) by mouth daily. 01/07/24   Madireddy, Alean SAUNDERS, MD  solifenacin (VESICARE) 10 MG tablet Take 10 mg by mouth daily. 01/17/21   [provider]  tamsulosin  (FLOMAX ) 0.4 MG CAPS capsule Take 1 capsule (0.4 mg total) by mouth daily. 09/09/23   [name] [name], MD    Allergies: Penicillins, Iodinated contrast media, Asa [aspirin ], Metformin and related, Monodox [doxycycline hyclate], Sulfa antibiotics, and Ultram [tramadol]    Review of Systems  Updated Vital Signs BP (!) 141/67   Pulse 74   Temp 98.7 F (37.1  C)   Resp 18   Ht 5' 7 (1.702 m)   Wt 62.1 kg   SpO2 98%   BMI 21.46 kg/m   Physical Exam Vitals and nursing note reviewed.  Constitutional:      General: He is not in acute distress.    Appearance: He is well-developed.  HENT:     Head: Normocephalic.   Eyes:     Conjunctiva/sclera: Conjunctivae normal.  Cardiovascular:     Rate and Rhythm: Normal rate and regular rhythm.  Pulmonary:     Effort: Pulmonary effort is normal. No respiratory distress.     Breath sounds: No stridor.  Abdominal:      General: There is no distension.  Skin:    General: Skin is warm and dry.  Neurological:     General: No focal deficit present.     Mental Status: He is alert and oriented to person, place, and time.     Cranial Nerves: Cranial nerves 2-12 are intact.     (all labs ordered are listed, but only abnormal results are displayed) Labs Reviewed  COMPREHENSIVE METABOLIC PANEL WITH GFR - Abnormal; Notable for the following components:      Result Value   Glucose, Bld 233 (*)    BUN 24 (*)    Total Protein 6.3 (*)    Total Bilirubin 1.4 (*)    All other components within normal limits  CBC WITH DIFFERENTIAL/PLATELET - Abnormal; Notable for the following components:   Hemoglobin 10.4 (*)    HCT 37.3 (*)    MCV 71.5 (*)    MCH 19.9 (*)    MCHC 27.9 (*)    RDW 18.1 (*)    All other components within normal limits  I-STAT CG4 LACTIC ACID, ED  I-STAT CG4 LACTIC ACID, ED    EKG: EKG Interpretation Date/Time:  Sunday April 12 2024 10:59:21 EDT Ventricular Rate:  93 PR Interval:  162 QRS Duration:  126 QT Interval:  376 QTC Calculation: 467 R Axis:   -36  Text Interpretation: Normal sinus rhythm Left axis deviation Right bundle branch block Minimal voltage criteria for LVH, may be normal variant ( R in aVL ) Confirmed by Garrick Charleston [phone]) on 04/12/2024 12:28:51 PM  Radiology: No results found.   Procedures   Medications Ordered in the ED - No data to display                                  Medical Decision Making Adult male postop day 2 following carotid endarterectomy now with substantial swelling, concern for hematoma most likely, infection considered less likely, obstruction possible that the patient's neuroexam is reassuring. Case discussed with our vascular surgery team.  Amount and/or Complexity of Data Reviewed Independent Historian: spouse External Data Reviewed: notes.    Details: Vascular surgery follow-up note yesterday included below Labs:  ordered.  Assessment/Plan:  83 y.o. male is 1 day postop, s/p: Left carotid endarterectomy     - Postoperatively the patient developed a small, but soft hematoma in the left neck.  This morning he still has a small, soft hematoma however this is improved from yesterday. -Left-sided JP drain with 60 cc serosanguineous drainage since surgery. 40cc was overnight, likely related to hematoma drainage -Left-sided neck incision is intact and dry.  I have removed his JP drain and placed a dry bandage over the exit site -He is neurologically intact on exam and  denies any slurred speech, facial droop, sudden visual changes, or dizziness overnight.  He is ambulating without difficulty.  He is tolerating a normal diet without swallowing difficulty. -Hemoglobin dropped to 9.9 today from 11.9 yesterday.  This is likely due to intraop blood loss.  No indications for transfusion.  No indications of active bleeding -Given the patient's hematoma formation yesterday, we have discontinued his Plavix .  He will continue his aspirin  daily for at least 1 month postop.  He will resume his Eliquis  in 5 days -Likely home past noon today as long as he has no further bleeding issues with drain removal.  We will monitor him for the next few hours prior to discharge to ensure there is no further hematoma formation -Will arrange follow-up with our office in 2 to 3 weeks for his incision check     Ahmed Holster, PA-C [facility] [phone] 04/11/2024 8:18 AM 12:45 PM I discussed the patient's case with our vascular surgery team.  Patient offered admission for observation, would like to go home and follow-up as an outpatient.  With reassuring vitals, labs, neuroexam, request accommodated.     Final diagnoses:  Post-operative pain    ED Discharge Orders     None          Garrick Charleston, MD 04/12/24 1245

## 2024-04-12 NOTE — ED Notes (Signed)
 Vascular at bedside

## 2024-04-13 ENCOUNTER — Encounter (HOSPITAL_COMMUNITY): Payer: Self-pay | Admitting: Vascular Surgery

## 2024-04-13 ENCOUNTER — Telehealth: Payer: Self-pay

## 2024-04-13 DIAGNOSIS — I6521 Occlusion and stenosis of right carotid artery: Secondary | ICD-10-CM | POA: Diagnosis not present

## 2024-04-13 DIAGNOSIS — I2510 Atherosclerotic heart disease of native coronary artery without angina pectoris: Secondary | ICD-10-CM | POA: Diagnosis not present

## 2024-04-13 DIAGNOSIS — Z8546 Personal history of malignant neoplasm of prostate: Secondary | ICD-10-CM | POA: Diagnosis not present

## 2024-04-13 DIAGNOSIS — I951 Orthostatic hypotension: Secondary | ICD-10-CM | POA: Diagnosis not present

## 2024-04-13 DIAGNOSIS — I480 Paroxysmal atrial fibrillation: Secondary | ICD-10-CM | POA: Diagnosis not present

## 2024-04-13 DIAGNOSIS — I10 Essential (primary) hypertension: Secondary | ICD-10-CM | POA: Diagnosis not present

## 2024-04-13 DIAGNOSIS — J45909 Unspecified asthma, uncomplicated: Secondary | ICD-10-CM | POA: Diagnosis not present

## 2024-04-13 DIAGNOSIS — Z8673 Personal history of transient ischemic attack (TIA), and cerebral infarction without residual deficits: Secondary | ICD-10-CM | POA: Diagnosis not present

## 2024-04-13 DIAGNOSIS — E039 Hypothyroidism, unspecified: Secondary | ICD-10-CM | POA: Diagnosis not present

## 2024-04-13 DIAGNOSIS — Z85528 Personal history of other malignant neoplasm of kidney: Secondary | ICD-10-CM | POA: Diagnosis not present

## 2024-04-13 DIAGNOSIS — D649 Anemia, unspecified: Secondary | ICD-10-CM | POA: Diagnosis not present

## 2024-04-13 DIAGNOSIS — Z7901 Long term (current) use of anticoagulants: Secondary | ICD-10-CM | POA: Diagnosis not present

## 2024-04-13 DIAGNOSIS — Z7984 Long term (current) use of oral hypoglycemic drugs: Secondary | ICD-10-CM | POA: Diagnosis not present

## 2024-04-13 DIAGNOSIS — E119 Type 2 diabetes mellitus without complications: Secondary | ICD-10-CM | POA: Diagnosis not present

## 2024-04-13 DIAGNOSIS — Z952 Presence of prosthetic heart valve: Secondary | ICD-10-CM | POA: Diagnosis not present

## 2024-04-13 DIAGNOSIS — Z556 Problems related to health literacy: Secondary | ICD-10-CM | POA: Diagnosis not present

## 2024-04-13 DIAGNOSIS — I97638 Postprocedural hematoma of a circulatory system organ or structure following other circulatory system procedure: Secondary | ICD-10-CM | POA: Diagnosis not present

## 2024-04-13 DIAGNOSIS — Z79899 Other long term (current) drug therapy: Secondary | ICD-10-CM | POA: Diagnosis not present

## 2024-04-13 DIAGNOSIS — Z85828 Personal history of other malignant neoplasm of skin: Secondary | ICD-10-CM | POA: Diagnosis not present

## 2024-04-13 DIAGNOSIS — Z7951 Long term (current) use of inhaled steroids: Secondary | ICD-10-CM | POA: Diagnosis not present

## 2024-04-13 NOTE — Telephone Encounter (Signed)
 Leita, RN from [facility] called for verbal orders:  Verbal orders given for skilled nursing care and to watch hematoma. 3x/wk for 1 week and 2x/wk for 3 weeks.

## 2024-04-13 NOTE — Anesthesia Postprocedure Evaluation (Signed)
 Anesthesia Post Note  Patient: Bruce Nunez  Procedure(s) Performed: LEFT CAROTID ENDARTERECTOMY (Left: Neck) PATCH ANGIOPLASTY, USING XENOSURE BIOLOGIC PATCH (Left: Neck)     Patient location during evaluation: PACU Anesthesia Type: General Level of consciousness: awake Pain management: pain level controlled Vital Signs Assessment: post-procedure vital signs reviewed and stable Respiratory status: spontaneous breathing, nonlabored ventilation and respiratory function stable Cardiovascular status: blood pressure returned to baseline and stable Postop Assessment: no apparent nausea or vomiting Anesthetic complications: no   No notable events documented.  Last Vitals:  Vitals:   04/11/24 0848 04/11/24 1121  BP:  108/66  Pulse:  87  Resp:  19  Temp:  36.6 C  SpO2: 100% 100%    Last Pain:  Vitals:   04/11/24 1121  TempSrc: Oral  PainSc:                  Tea Collums P Ria Redcay

## 2024-04-15 DIAGNOSIS — I6521 Occlusion and stenosis of right carotid artery: Secondary | ICD-10-CM | POA: Diagnosis not present

## 2024-04-15 DIAGNOSIS — Z85528 Personal history of other malignant neoplasm of kidney: Secondary | ICD-10-CM | POA: Diagnosis not present

## 2024-04-15 DIAGNOSIS — D649 Anemia, unspecified: Secondary | ICD-10-CM | POA: Diagnosis not present

## 2024-04-15 DIAGNOSIS — Z7984 Long term (current) use of oral hypoglycemic drugs: Secondary | ICD-10-CM | POA: Diagnosis not present

## 2024-04-15 DIAGNOSIS — Z7901 Long term (current) use of anticoagulants: Secondary | ICD-10-CM | POA: Diagnosis not present

## 2024-04-15 DIAGNOSIS — E039 Hypothyroidism, unspecified: Secondary | ICD-10-CM | POA: Diagnosis not present

## 2024-04-15 DIAGNOSIS — I480 Paroxysmal atrial fibrillation: Secondary | ICD-10-CM | POA: Diagnosis not present

## 2024-04-15 DIAGNOSIS — Z7951 Long term (current) use of inhaled steroids: Secondary | ICD-10-CM | POA: Diagnosis not present

## 2024-04-15 DIAGNOSIS — Z8546 Personal history of malignant neoplasm of prostate: Secondary | ICD-10-CM | POA: Diagnosis not present

## 2024-04-15 DIAGNOSIS — Z952 Presence of prosthetic heart valve: Secondary | ICD-10-CM | POA: Diagnosis not present

## 2024-04-15 DIAGNOSIS — Z556 Problems related to health literacy: Secondary | ICD-10-CM | POA: Diagnosis not present

## 2024-04-15 DIAGNOSIS — I97638 Postprocedural hematoma of a circulatory system organ or structure following other circulatory system procedure: Secondary | ICD-10-CM | POA: Diagnosis not present

## 2024-04-15 DIAGNOSIS — Z79899 Other long term (current) drug therapy: Secondary | ICD-10-CM | POA: Diagnosis not present

## 2024-04-15 DIAGNOSIS — I10 Essential (primary) hypertension: Secondary | ICD-10-CM | POA: Diagnosis not present

## 2024-04-15 DIAGNOSIS — E119 Type 2 diabetes mellitus without complications: Secondary | ICD-10-CM | POA: Diagnosis not present

## 2024-04-15 DIAGNOSIS — Z8673 Personal history of transient ischemic attack (TIA), and cerebral infarction without residual deficits: Secondary | ICD-10-CM | POA: Diagnosis not present

## 2024-04-15 DIAGNOSIS — J45909 Unspecified asthma, uncomplicated: Secondary | ICD-10-CM | POA: Diagnosis not present

## 2024-04-15 DIAGNOSIS — I2510 Atherosclerotic heart disease of native coronary artery without angina pectoris: Secondary | ICD-10-CM | POA: Diagnosis not present

## 2024-04-15 DIAGNOSIS — Z85828 Personal history of other malignant neoplasm of skin: Secondary | ICD-10-CM | POA: Diagnosis not present

## 2024-04-15 DIAGNOSIS — I951 Orthostatic hypotension: Secondary | ICD-10-CM | POA: Diagnosis not present

## 2024-04-16 DIAGNOSIS — Z8546 Personal history of malignant neoplasm of prostate: Secondary | ICD-10-CM | POA: Diagnosis not present

## 2024-04-16 DIAGNOSIS — D649 Anemia, unspecified: Secondary | ICD-10-CM | POA: Diagnosis not present

## 2024-04-16 DIAGNOSIS — Z85528 Personal history of other malignant neoplasm of kidney: Secondary | ICD-10-CM | POA: Diagnosis not present

## 2024-04-16 DIAGNOSIS — I480 Paroxysmal atrial fibrillation: Secondary | ICD-10-CM | POA: Diagnosis not present

## 2024-04-16 DIAGNOSIS — Z8673 Personal history of transient ischemic attack (TIA), and cerebral infarction without residual deficits: Secondary | ICD-10-CM | POA: Diagnosis not present

## 2024-04-16 DIAGNOSIS — I97638 Postprocedural hematoma of a circulatory system organ or structure following other circulatory system procedure: Secondary | ICD-10-CM | POA: Diagnosis not present

## 2024-04-16 DIAGNOSIS — E119 Type 2 diabetes mellitus without complications: Secondary | ICD-10-CM | POA: Diagnosis not present

## 2024-04-16 DIAGNOSIS — I10 Essential (primary) hypertension: Secondary | ICD-10-CM | POA: Diagnosis not present

## 2024-04-16 DIAGNOSIS — Z7901 Long term (current) use of anticoagulants: Secondary | ICD-10-CM | POA: Diagnosis not present

## 2024-04-16 DIAGNOSIS — Z7951 Long term (current) use of inhaled steroids: Secondary | ICD-10-CM | POA: Diagnosis not present

## 2024-04-16 DIAGNOSIS — Z85828 Personal history of other malignant neoplasm of skin: Secondary | ICD-10-CM | POA: Diagnosis not present

## 2024-04-16 DIAGNOSIS — I6521 Occlusion and stenosis of right carotid artery: Secondary | ICD-10-CM | POA: Diagnosis not present

## 2024-04-16 DIAGNOSIS — J45909 Unspecified asthma, uncomplicated: Secondary | ICD-10-CM | POA: Diagnosis not present

## 2024-04-16 DIAGNOSIS — I2510 Atherosclerotic heart disease of native coronary artery without angina pectoris: Secondary | ICD-10-CM | POA: Diagnosis not present

## 2024-04-16 DIAGNOSIS — Z79899 Other long term (current) drug therapy: Secondary | ICD-10-CM | POA: Diagnosis not present

## 2024-04-16 DIAGNOSIS — Z952 Presence of prosthetic heart valve: Secondary | ICD-10-CM | POA: Diagnosis not present

## 2024-04-16 DIAGNOSIS — I951 Orthostatic hypotension: Secondary | ICD-10-CM | POA: Diagnosis not present

## 2024-04-16 DIAGNOSIS — E039 Hypothyroidism, unspecified: Secondary | ICD-10-CM | POA: Diagnosis not present

## 2024-04-16 DIAGNOSIS — Z556 Problems related to health literacy: Secondary | ICD-10-CM | POA: Diagnosis not present

## 2024-04-16 DIAGNOSIS — Z7984 Long term (current) use of oral hypoglycemic drugs: Secondary | ICD-10-CM | POA: Diagnosis not present

## 2024-04-17 ENCOUNTER — Ambulatory Visit: Payer: Self-pay | Admitting: Cardiology

## 2024-04-17 ENCOUNTER — Ambulatory Visit (INDEPENDENT_AMBULATORY_CARE_PROVIDER_SITE_OTHER): Payer: Medicare Other

## 2024-04-17 DIAGNOSIS — I480 Paroxysmal atrial fibrillation: Secondary | ICD-10-CM | POA: Diagnosis not present

## 2024-04-17 LAB — CUP PACEART REMOTE DEVICE CHECK
Battery Remaining Longevity: 122 mo
Battery Remaining Percentage: 95.5 %
Battery Voltage: 3.01 V
Brady Statistic AP VP Percent: 1 %
Brady Statistic AP VS Percent: 1 %
Brady Statistic AS VP Percent: 1 %
Brady Statistic AS VS Percent: 99 %
Brady Statistic RA Percent Paced: 1 %
Brady Statistic RV Percent Paced: 1 %
Date Time Interrogation Session: 20250822051802
Implantable Lead Connection Status: 753985
Implantable Lead Connection Status: 753985
Implantable Lead Implant Date: 20241120
Implantable Lead Implant Date: 20241120
Implantable Lead Location: 753859
Implantable Lead Location: 753860
Implantable Pulse Generator Implant Date: 20241120
Lead Channel Impedance Value: 400 Ohm
Lead Channel Impedance Value: 490 Ohm
Lead Channel Pacing Threshold Amplitude: 0.75 V
Lead Channel Pacing Threshold Amplitude: 1.25 V
Lead Channel Pacing Threshold Pulse Width: 0.5 ms
Lead Channel Pacing Threshold Pulse Width: 0.5 ms
Lead Channel Sensing Intrinsic Amplitude: 4.1 mV
Lead Channel Sensing Intrinsic Amplitude: 5 mV
Lead Channel Setting Pacing Amplitude: 1 V
Lead Channel Setting Pacing Amplitude: 2.25 V
Lead Channel Setting Pacing Pulse Width: 0.5 ms
Lead Channel Setting Sensing Sensitivity: 0.5 mV
Pulse Gen Model: 2272
Pulse Gen Serial Number: 8213495

## 2024-04-19 NOTE — Discharge Summary (Signed)
 Discharge Summary     Bruce Nunez [birth year] 83 y.o. 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 The pt tolerated the procedure well and was transported to the PACU in good condition.  In the evening on the day of surgery the patient had developed a small hematoma at his left neck incision, however this was soft and not expanding.  He was neurologically intact.  By POD 1, the pt neuro status was intact.  His left-sided neck incision was intact and dry.  He continued to have a small hematoma at his incision site, however this appeared improved from the night prior.  His hematoma was soft.  He was neurologically intact on exam.  His JP drain had 60 cc output since surgery.  We removed his JP drain and allowed him to mobilize.  His hemoglobin had a slight drop from 11.9 to 9.9 postop, likely due to intraoperative blood loss.  He had no further signs of blood loss or indications for transfusion.  He was mobilizing well without any difficulty on POD 1.  His hematoma did not increase in size with mobilization.  He was tolerating a normal diet without swallowing difficulties.  He was voiding without difficulty.  He was told to hold his home Eliquis  until 5 days postop.  He was discharged home on POD 1.   No results for input(s): NA, K, CL, CO2, GLUCOSE, BUN, CALCIUM  in the last 72 hours.  Invalid input(s): CRATININE No results for  input(s): WBC, HGB, HCT, PLT in the last 72 hours. No results for input(s): INR in the last 72 hours.   Discharge Instructions     Call MD for:  redness, tenderness, or signs of infection (pain, swelling, redness, odor or green/yellow discharge around incision site)   Complete by: As directed    Call MD for:  severe uncontrolled pain   Complete by: As directed    Call MD for:  temperature >100.4   Complete by: As directed    Diet - low sodium heart healthy   Complete by: As directed    Discharge instructions   Complete by: As directed    Resume taking your Eliquis  on Tuesday, 8/19.  Continue taking a baby aspirin  once daily for 1 month postop.  Discontinue Plavix .   Discharge wound care:   Complete by: As directed    Keep your left-sided neck incision clean and dry at home.  Clean the area with soap and water , then pat dry.  Keep a dry bandage over your drain exit site over the next few days for any drainage.   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   Endotracheally intubated 07/05/2023   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   Stroke (HCC) 03/28/2022   PONV (postoperative nausea and vomiting)    Inguinal hernia    Hypertension    Hyperlipidemia    Hx of transfusion    Hx of peptic ulcer    Hx of  nonmelanoma skin cancer    Hx of colon cancer, stage I    History of kidney stones    Hemorrhoids    Heart murmur    GERD (gastroesophageal reflux disease)    Frequency of urination    Diabetes mellitus without complication (HCC)    Complication of anesthesia    Asthma    Anemia    Aortic stenosis 04/29/2019   Aortic regurgitation 03/12/2017   Essential hypertension 03/12/2017   Right inguinal hernia 05/08/2016   S/P right inguinal hernia repair Sept 2015 05/17/2014   Prostate cancer (HCC) 08/15/2012   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   Heart murmur    Hemorrhoids    History of kidney cancer    History of kidney stones    Hx of colon cancer, stage I    Hx of nonmelanoma skin cancer    Hx of peptic ulcer    as teenager   Hx of transfusion    Hyperlipidemia    Hypertension    Inguinal hernia    Orthostatic hypotension 01/06/2024   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   PAF (paroxysmal atrial fibrillation) (HCC) 08/20/2023   PONV (postoperative nausea and vomiting)    only when had Ether as Anesthesia drug   Prostate cancer (HCC) 08/15/2012   also Hx colon cancer / Hx skin cancer   Right inguinal hernia 05/08/2016   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   S/P right inguinal hernia repair Sept 2015 05/17/2014   Recurrent right indirect  Hernia treated with robotic TAPP repair with enterolysis Sept 2017    Stroke [facility]) 03/28/2022   Syncope 08/17/2023    Allergies as of 04/11/2024       Reactions   Penicillins Anaphylaxis, Shortness Of Breath, Itching, Swelling   Throat swells up but no intubation per wife   Iodinated Contrast Media Hives, Rash   Asa [aspirin ] Other (See Comments)   Hx of GI bleeds   Metformin And Related Diarrhea   Monodox [doxycycline Hyclate] Nausea Only   Sulfa Antibiotics Itching, Swelling   Ultram [tramadol] Nausea And Vomiting        Medication List     STOP taking these medications    aspirin  EC 81 MG tablet   clopidogrel  75 MG tablet Commonly known as: PLAVIX   predniSONE  50 MG tablet Commonly known as: DELTASONE        TAKE these medications    acetaminophen  325 MG tablet Commonly known as: TYLENOL  Take 1-2 tablets (325-650 mg total) by mouth every 6 (six) hours as needed for mild pain (pain score 1-3).   albuterol  108 (90 Base) MCG/ACT inhaler Commonly known as: VENTOLIN  HFA Inhale 2 puffs into the lungs every 6 (six) hours as needed for shortness of breath.   apixaban  5 MG Tabs tablet Commonly known as: ELIQUIS  Take 1 tablet (5 mg total) by mouth 2 (two) times daily.   budesonide -formoterol  160-4.5 MCG/ACT inhaler Commonly known as: SYMBICORT Inhale 2 puffs into the lungs 2 (two) times daily.   cyanocobalamin  1000 MCG/ML injection Commonly known as: VITAMIN B12 Inject 1,000 mcg into the muscle every 30 (thirty) days.   empagliflozin  10 MG Tabs tablet Commonly known as: JARDIANCE  Take 1 tablet (10 mg total) by mouth daily.   fexofenadine 180 MG tablet Commonly known as: ALLEGRA Take 180 mg by mouth daily.   fluticasone  50 MCG/ACT nasal spray Commonly known as: FLONASE  Place 2 sprays into both nostrils 2 (two) times daily.   levothyroxine  75 MCG tablet Commonly known as: SYNTHROID  Take 75 mcg by mouth daily before breakfast.   metoprolol  succinate 25 MG 24 hr tablet Commonly known as: TOPROL -XL Take 1 tablet (25 mg  total) by mouth daily. Take with or immediately following a meal.   midodrine  5 MG tablet Commonly known as: PROAMATINE  Take 1 tablet (5 mg total) by mouth 3 (three) times daily with meals. Take the third dose of this medication at 3 pm   oxyCODONE  5 MG immediate release tablet Commonly known as: Oxy IR/ROXICODONE  Take 1-2 tablets (5-10 mg total) by mouth every 4 (four) hours as needed for moderate pain (pain score 4-6).   pantoprazole  20 MG tablet Commonly known as: PROTONIX  Take 20 mg by mouth daily.   rosuvastatin  20 MG tablet Commonly known as: CRESTOR  Take 1 tablet (20 mg total) by mouth daily.   solifenacin 10 MG tablet Commonly known as: VESICARE Take 10 mg by mouth daily.   tamsulosin  0.4 MG Caps capsule Commonly known as: FLOMAX  Take 1 capsule (0.4 mg total) by mouth daily.               Discharge Care Instructions  (From admission, onward)           Start     Ordered   04/11/24 0000  Discharge wound care:       Comments: Keep your left-sided neck incision clean and dry at home.  Clean the area with soap and water , then pat dry.  Keep a dry bandage over your drain exit site over the next few days for any drainage.   04/11/24 [id number]             Discharge Instructions:   [facility] Discharge Instructions Carotid Revascularization  Please refer to the following instructions for your post-procedure care. Your surgeon or physician assistant will discuss any changes with you.  Activity  You are encouraged to walk as much as you can. You can slowly return to normal activities but must avoid strenuous activity and heavy lifting until your doctor tell you it's OK. Avoid activities such as vacuuming or swinging a golf club. You can drive after one week if you are comfortable and you are no longer taking prescription pain medications. It is normal to feel tired for serval weeks  after your surgery. It is also normal to have  difficulty with sleep habits, eating, and bowel movements after surgery. These will go away with time.  Bathing/Showering  You may shower after you come home. Do not soak in a bathtub, hot tub, or swim until the incision heals completely.  Incision Care  Shower every day. Clean your incision with mild soap and water . Pat the area dry with a clean towel. You do not need a bandage unless otherwise instructed. Do not apply any ointments or creams to your incision. You may have skin glue on your incision. Do not peel it off. It will come off on its own in about one week. Your incision may feel thickened and raised for several weeks after your surgery. This is normal and the skin will soften over time. For Men Only: It's OK to shave around the incision but do not shave the incision itself for 2 weeks. It is common to have numbness under your chin that could last for several months.  Diet  Resume your normal diet. There are no special food restrictions following this procedure. A low fat/low cholesterol diet is recommended for all patients with vascular disease. In order to heal from your surgery, it is CRITICAL to get adequate nutrition. Your body requires vitamins, minerals, and protein. Vegetables are the best source of vitamins and minerals. Vegetables also provide the perfect balance of protein. Processed food has little nutritional value, so try to avoid this.  Medications  Resume taking all of your medications unless your doctor or physician assistant tells you not to.  If your incision is causing pain, you may take over-the- counter pain relievers such as acetaminophen  (Tylenol ). If you were prescribed a stronger pain medication, please be aware these medications can cause nausea and constipation.  Prevent nausea by taking the medication with a snack or meal. Avoid constipation by drinking plenty of fluids and eating foods with a high amount of fiber, such as fruits, vegetables, and grains. Do not  take Tylenol  if you are taking prescription pain medications.  Follow Up  Our office will schedule a follow up appointment 2-3 weeks following discharge.  Please call us  immediately for any of the following conditions  Increased pain, redness, drainage (pus) from your incision site. Fever of 101 degrees or higher. If you should develop stroke (slurred speech, difficulty swallowing, weakness on one side of your body, loss of vision) you should call 911 and go to the nearest emergency room.  Reduce your risk of vascular disease:  Stop smoking. If you would like help call [facility] at 1-800-QUIT-NOW (865-592-5806) or [place] at (534)697-7840. Manage your cholesterol Maintain a desired weight Control your diabetes Keep your blood pressure down  If you have any questions, please call the office at [phone].  Disposition: Home  Patient's condition: is Good  Follow up: 1. [facility] in 2 weeks.   Ahmed Holster, PA-C [facility] [phone]   --- For [facility] Registry use ---   Modified Rankin score at D/C (0-6): 0  IV medication needed for:  1. Hypertension: No 2. Hypotension: No  Post-op Complications: No  1. Post-op CVA or TIA: No  If yes: Event classification (right eye, left eye, right cortical, left cortical, verterobasilar, other):   If yes: Timing of event (intra-op, <6 hrs post-op, >=6 hrs post-op, unknown):   2. CN injury: No  If yes: CN  injuried   3. Myocardial infarction: No  If yes: Dx by (EKG or  clinical, Troponin):   4.  CHF: No  5.  Dysrhythmia (new): No  6. Wound infection: No  7. Reperfusion symptoms: No  8. Return to OR: No  If yes: return to OR for (bleeding, neurologic, other CEA incision, other):   Discharge medications: Statin use:  Yes ASA use:  No   Beta blocker use:  Yes ACE-Inhibitor use:  No  ARB use:  No CCB use: No P2Y12 Antagonist use: No, [ ]  Plavix , [ ]  Plasugrel, [ ]  Ticlopinine, [ ]  Ticagrelor,  [ ]  Other, [ ]  No for medical reason, [ ]  Non-compliant, [ ]  Not-indicated Anti-coagulant use:  Yes, [ ]  Warfarin, [ ]  Rivaroxaban, [ x] Apixaban ,

## 2024-04-21 DIAGNOSIS — J45909 Unspecified asthma, uncomplicated: Secondary | ICD-10-CM | POA: Diagnosis not present

## 2024-04-21 DIAGNOSIS — I480 Paroxysmal atrial fibrillation: Secondary | ICD-10-CM | POA: Diagnosis not present

## 2024-04-21 DIAGNOSIS — E039 Hypothyroidism, unspecified: Secondary | ICD-10-CM | POA: Diagnosis not present

## 2024-04-21 DIAGNOSIS — Z85828 Personal history of other malignant neoplasm of skin: Secondary | ICD-10-CM | POA: Diagnosis not present

## 2024-04-21 DIAGNOSIS — Z7901 Long term (current) use of anticoagulants: Secondary | ICD-10-CM | POA: Diagnosis not present

## 2024-04-21 DIAGNOSIS — Z952 Presence of prosthetic heart valve: Secondary | ICD-10-CM | POA: Diagnosis not present

## 2024-04-21 DIAGNOSIS — Z85528 Personal history of other malignant neoplasm of kidney: Secondary | ICD-10-CM | POA: Diagnosis not present

## 2024-04-21 DIAGNOSIS — I2510 Atherosclerotic heart disease of native coronary artery without angina pectoris: Secondary | ICD-10-CM | POA: Diagnosis not present

## 2024-04-21 DIAGNOSIS — Z8673 Personal history of transient ischemic attack (TIA), and cerebral infarction without residual deficits: Secondary | ICD-10-CM | POA: Diagnosis not present

## 2024-04-21 DIAGNOSIS — Z79899 Other long term (current) drug therapy: Secondary | ICD-10-CM | POA: Diagnosis not present

## 2024-04-21 DIAGNOSIS — I6521 Occlusion and stenosis of right carotid artery: Secondary | ICD-10-CM | POA: Diagnosis not present

## 2024-04-21 DIAGNOSIS — D649 Anemia, unspecified: Secondary | ICD-10-CM | POA: Diagnosis not present

## 2024-04-21 DIAGNOSIS — I97638 Postprocedural hematoma of a circulatory system organ or structure following other circulatory system procedure: Secondary | ICD-10-CM | POA: Diagnosis not present

## 2024-04-21 DIAGNOSIS — I10 Essential (primary) hypertension: Secondary | ICD-10-CM | POA: Diagnosis not present

## 2024-04-21 DIAGNOSIS — I951 Orthostatic hypotension: Secondary | ICD-10-CM | POA: Diagnosis not present

## 2024-04-21 DIAGNOSIS — E119 Type 2 diabetes mellitus without complications: Secondary | ICD-10-CM | POA: Diagnosis not present

## 2024-04-21 DIAGNOSIS — Z556 Problems related to health literacy: Secondary | ICD-10-CM | POA: Diagnosis not present

## 2024-04-21 DIAGNOSIS — Z8546 Personal history of malignant neoplasm of prostate: Secondary | ICD-10-CM | POA: Diagnosis not present

## 2024-04-21 DIAGNOSIS — Z7984 Long term (current) use of oral hypoglycemic drugs: Secondary | ICD-10-CM | POA: Diagnosis not present

## 2024-04-21 DIAGNOSIS — Z7951 Long term (current) use of inhaled steroids: Secondary | ICD-10-CM | POA: Diagnosis not present

## 2024-04-22 DIAGNOSIS — Z8546 Personal history of malignant neoplasm of prostate: Secondary | ICD-10-CM | POA: Diagnosis not present

## 2024-04-22 DIAGNOSIS — I10 Essential (primary) hypertension: Secondary | ICD-10-CM | POA: Diagnosis not present

## 2024-04-22 DIAGNOSIS — I2510 Atherosclerotic heart disease of native coronary artery without angina pectoris: Secondary | ICD-10-CM | POA: Diagnosis not present

## 2024-04-22 DIAGNOSIS — I6521 Occlusion and stenosis of right carotid artery: Secondary | ICD-10-CM | POA: Diagnosis not present

## 2024-04-22 DIAGNOSIS — J45909 Unspecified asthma, uncomplicated: Secondary | ICD-10-CM | POA: Diagnosis not present

## 2024-04-22 DIAGNOSIS — I480 Paroxysmal atrial fibrillation: Secondary | ICD-10-CM | POA: Diagnosis not present

## 2024-04-22 DIAGNOSIS — Z7901 Long term (current) use of anticoagulants: Secondary | ICD-10-CM | POA: Diagnosis not present

## 2024-04-22 DIAGNOSIS — I951 Orthostatic hypotension: Secondary | ICD-10-CM | POA: Diagnosis not present

## 2024-04-22 DIAGNOSIS — Z85528 Personal history of other malignant neoplasm of kidney: Secondary | ICD-10-CM | POA: Diagnosis not present

## 2024-04-22 DIAGNOSIS — Z8673 Personal history of transient ischemic attack (TIA), and cerebral infarction without residual deficits: Secondary | ICD-10-CM | POA: Diagnosis not present

## 2024-04-22 DIAGNOSIS — E119 Type 2 diabetes mellitus without complications: Secondary | ICD-10-CM | POA: Diagnosis not present

## 2024-04-22 DIAGNOSIS — D649 Anemia, unspecified: Secondary | ICD-10-CM | POA: Diagnosis not present

## 2024-04-22 DIAGNOSIS — Z79899 Other long term (current) drug therapy: Secondary | ICD-10-CM | POA: Diagnosis not present

## 2024-04-22 DIAGNOSIS — Z85828 Personal history of other malignant neoplasm of skin: Secondary | ICD-10-CM | POA: Diagnosis not present

## 2024-04-22 DIAGNOSIS — I97638 Postprocedural hematoma of a circulatory system organ or structure following other circulatory system procedure: Secondary | ICD-10-CM | POA: Diagnosis not present

## 2024-04-22 DIAGNOSIS — Z952 Presence of prosthetic heart valve: Secondary | ICD-10-CM | POA: Diagnosis not present

## 2024-04-22 DIAGNOSIS — Z7984 Long term (current) use of oral hypoglycemic drugs: Secondary | ICD-10-CM | POA: Diagnosis not present

## 2024-04-22 DIAGNOSIS — Z556 Problems related to health literacy: Secondary | ICD-10-CM | POA: Diagnosis not present

## 2024-04-22 DIAGNOSIS — E039 Hypothyroidism, unspecified: Secondary | ICD-10-CM | POA: Diagnosis not present

## 2024-04-22 DIAGNOSIS — Z7951 Long term (current) use of inhaled steroids: Secondary | ICD-10-CM | POA: Diagnosis not present

## 2024-04-24 DIAGNOSIS — I2510 Atherosclerotic heart disease of native coronary artery without angina pectoris: Secondary | ICD-10-CM | POA: Diagnosis not present

## 2024-04-24 DIAGNOSIS — Z7901 Long term (current) use of anticoagulants: Secondary | ICD-10-CM | POA: Diagnosis not present

## 2024-04-24 DIAGNOSIS — Z7984 Long term (current) use of oral hypoglycemic drugs: Secondary | ICD-10-CM | POA: Diagnosis not present

## 2024-04-24 DIAGNOSIS — Z8673 Personal history of transient ischemic attack (TIA), and cerebral infarction without residual deficits: Secondary | ICD-10-CM | POA: Diagnosis not present

## 2024-04-24 DIAGNOSIS — Z85528 Personal history of other malignant neoplasm of kidney: Secondary | ICD-10-CM | POA: Diagnosis not present

## 2024-04-24 DIAGNOSIS — Z556 Problems related to health literacy: Secondary | ICD-10-CM | POA: Diagnosis not present

## 2024-04-24 DIAGNOSIS — Z8546 Personal history of malignant neoplasm of prostate: Secondary | ICD-10-CM | POA: Diagnosis not present

## 2024-04-24 DIAGNOSIS — Z7951 Long term (current) use of inhaled steroids: Secondary | ICD-10-CM | POA: Diagnosis not present

## 2024-04-24 DIAGNOSIS — D649 Anemia, unspecified: Secondary | ICD-10-CM | POA: Diagnosis not present

## 2024-04-24 DIAGNOSIS — Z79899 Other long term (current) drug therapy: Secondary | ICD-10-CM | POA: Diagnosis not present

## 2024-04-24 DIAGNOSIS — I951 Orthostatic hypotension: Secondary | ICD-10-CM | POA: Diagnosis not present

## 2024-04-24 DIAGNOSIS — J45909 Unspecified asthma, uncomplicated: Secondary | ICD-10-CM | POA: Diagnosis not present

## 2024-04-24 DIAGNOSIS — Z85828 Personal history of other malignant neoplasm of skin: Secondary | ICD-10-CM | POA: Diagnosis not present

## 2024-04-24 DIAGNOSIS — I480 Paroxysmal atrial fibrillation: Secondary | ICD-10-CM | POA: Diagnosis not present

## 2024-04-24 DIAGNOSIS — I6521 Occlusion and stenosis of right carotid artery: Secondary | ICD-10-CM | POA: Diagnosis not present

## 2024-04-24 DIAGNOSIS — E119 Type 2 diabetes mellitus without complications: Secondary | ICD-10-CM | POA: Diagnosis not present

## 2024-04-24 DIAGNOSIS — E039 Hypothyroidism, unspecified: Secondary | ICD-10-CM | POA: Diagnosis not present

## 2024-04-24 DIAGNOSIS — Z952 Presence of prosthetic heart valve: Secondary | ICD-10-CM | POA: Diagnosis not present

## 2024-04-24 DIAGNOSIS — I97638 Postprocedural hematoma of a circulatory system organ or structure following other circulatory system procedure: Secondary | ICD-10-CM | POA: Diagnosis not present

## 2024-04-24 DIAGNOSIS — I10 Essential (primary) hypertension: Secondary | ICD-10-CM | POA: Diagnosis not present

## 2024-04-28 DIAGNOSIS — Z85828 Personal history of other malignant neoplasm of skin: Secondary | ICD-10-CM | POA: Diagnosis not present

## 2024-04-28 DIAGNOSIS — I480 Paroxysmal atrial fibrillation: Secondary | ICD-10-CM | POA: Diagnosis not present

## 2024-04-28 DIAGNOSIS — Z556 Problems related to health literacy: Secondary | ICD-10-CM | POA: Diagnosis not present

## 2024-04-28 DIAGNOSIS — E119 Type 2 diabetes mellitus without complications: Secondary | ICD-10-CM | POA: Diagnosis not present

## 2024-04-28 DIAGNOSIS — Z7951 Long term (current) use of inhaled steroids: Secondary | ICD-10-CM | POA: Diagnosis not present

## 2024-04-28 DIAGNOSIS — I6521 Occlusion and stenosis of right carotid artery: Secondary | ICD-10-CM | POA: Diagnosis not present

## 2024-04-28 DIAGNOSIS — D649 Anemia, unspecified: Secondary | ICD-10-CM | POA: Diagnosis not present

## 2024-04-28 DIAGNOSIS — Z8546 Personal history of malignant neoplasm of prostate: Secondary | ICD-10-CM | POA: Diagnosis not present

## 2024-04-28 DIAGNOSIS — Z85528 Personal history of other malignant neoplasm of kidney: Secondary | ICD-10-CM | POA: Diagnosis not present

## 2024-04-28 DIAGNOSIS — I2510 Atherosclerotic heart disease of native coronary artery without angina pectoris: Secondary | ICD-10-CM | POA: Diagnosis not present

## 2024-04-28 DIAGNOSIS — Z7984 Long term (current) use of oral hypoglycemic drugs: Secondary | ICD-10-CM | POA: Diagnosis not present

## 2024-04-28 DIAGNOSIS — Z8673 Personal history of transient ischemic attack (TIA), and cerebral infarction without residual deficits: Secondary | ICD-10-CM | POA: Diagnosis not present

## 2024-04-28 DIAGNOSIS — Z952 Presence of prosthetic heart valve: Secondary | ICD-10-CM | POA: Diagnosis not present

## 2024-04-28 DIAGNOSIS — I97638 Postprocedural hematoma of a circulatory system organ or structure following other circulatory system procedure: Secondary | ICD-10-CM | POA: Diagnosis not present

## 2024-04-28 DIAGNOSIS — I10 Essential (primary) hypertension: Secondary | ICD-10-CM | POA: Diagnosis not present

## 2024-04-28 DIAGNOSIS — E039 Hypothyroidism, unspecified: Secondary | ICD-10-CM | POA: Diagnosis not present

## 2024-04-28 DIAGNOSIS — J45909 Unspecified asthma, uncomplicated: Secondary | ICD-10-CM | POA: Diagnosis not present

## 2024-04-28 DIAGNOSIS — Z7901 Long term (current) use of anticoagulants: Secondary | ICD-10-CM | POA: Diagnosis not present

## 2024-04-28 DIAGNOSIS — Z79899 Other long term (current) drug therapy: Secondary | ICD-10-CM | POA: Diagnosis not present

## 2024-04-28 DIAGNOSIS — I951 Orthostatic hypotension: Secondary | ICD-10-CM | POA: Diagnosis not present

## 2024-04-29 DIAGNOSIS — Z8546 Personal history of malignant neoplasm of prostate: Secondary | ICD-10-CM | POA: Diagnosis not present

## 2024-04-29 DIAGNOSIS — Z79899 Other long term (current) drug therapy: Secondary | ICD-10-CM | POA: Diagnosis not present

## 2024-04-29 DIAGNOSIS — I2510 Atherosclerotic heart disease of native coronary artery without angina pectoris: Secondary | ICD-10-CM | POA: Diagnosis not present

## 2024-04-29 DIAGNOSIS — I6521 Occlusion and stenosis of right carotid artery: Secondary | ICD-10-CM | POA: Diagnosis not present

## 2024-04-29 DIAGNOSIS — Z7984 Long term (current) use of oral hypoglycemic drugs: Secondary | ICD-10-CM | POA: Diagnosis not present

## 2024-04-29 DIAGNOSIS — Z556 Problems related to health literacy: Secondary | ICD-10-CM | POA: Diagnosis not present

## 2024-04-29 DIAGNOSIS — I951 Orthostatic hypotension: Secondary | ICD-10-CM | POA: Diagnosis not present

## 2024-04-29 DIAGNOSIS — Z7901 Long term (current) use of anticoagulants: Secondary | ICD-10-CM | POA: Diagnosis not present

## 2024-04-29 DIAGNOSIS — Z85828 Personal history of other malignant neoplasm of skin: Secondary | ICD-10-CM | POA: Diagnosis not present

## 2024-04-29 DIAGNOSIS — E119 Type 2 diabetes mellitus without complications: Secondary | ICD-10-CM | POA: Diagnosis not present

## 2024-04-29 DIAGNOSIS — I97638 Postprocedural hematoma of a circulatory system organ or structure following other circulatory system procedure: Secondary | ICD-10-CM | POA: Diagnosis not present

## 2024-04-29 DIAGNOSIS — J45909 Unspecified asthma, uncomplicated: Secondary | ICD-10-CM | POA: Diagnosis not present

## 2024-04-29 DIAGNOSIS — Z8673 Personal history of transient ischemic attack (TIA), and cerebral infarction without residual deficits: Secondary | ICD-10-CM | POA: Diagnosis not present

## 2024-04-29 DIAGNOSIS — E039 Hypothyroidism, unspecified: Secondary | ICD-10-CM | POA: Diagnosis not present

## 2024-04-29 DIAGNOSIS — D649 Anemia, unspecified: Secondary | ICD-10-CM | POA: Diagnosis not present

## 2024-04-29 DIAGNOSIS — I10 Essential (primary) hypertension: Secondary | ICD-10-CM | POA: Diagnosis not present

## 2024-04-29 DIAGNOSIS — Z85528 Personal history of other malignant neoplasm of kidney: Secondary | ICD-10-CM | POA: Diagnosis not present

## 2024-04-29 DIAGNOSIS — I480 Paroxysmal atrial fibrillation: Secondary | ICD-10-CM | POA: Diagnosis not present

## 2024-04-29 DIAGNOSIS — Z952 Presence of prosthetic heart valve: Secondary | ICD-10-CM | POA: Diagnosis not present

## 2024-04-29 DIAGNOSIS — Z7951 Long term (current) use of inhaled steroids: Secondary | ICD-10-CM | POA: Diagnosis not present

## 2024-05-01 DIAGNOSIS — E039 Hypothyroidism, unspecified: Secondary | ICD-10-CM | POA: Diagnosis not present

## 2024-05-01 DIAGNOSIS — Z952 Presence of prosthetic heart valve: Secondary | ICD-10-CM | POA: Diagnosis not present

## 2024-05-01 DIAGNOSIS — I480 Paroxysmal atrial fibrillation: Secondary | ICD-10-CM | POA: Diagnosis not present

## 2024-05-01 DIAGNOSIS — I6521 Occlusion and stenosis of right carotid artery: Secondary | ICD-10-CM | POA: Diagnosis not present

## 2024-05-01 DIAGNOSIS — Z8546 Personal history of malignant neoplasm of prostate: Secondary | ICD-10-CM | POA: Diagnosis not present

## 2024-05-01 DIAGNOSIS — I10 Essential (primary) hypertension: Secondary | ICD-10-CM | POA: Diagnosis not present

## 2024-05-01 DIAGNOSIS — I951 Orthostatic hypotension: Secondary | ICD-10-CM | POA: Diagnosis not present

## 2024-05-01 DIAGNOSIS — Z79899 Other long term (current) drug therapy: Secondary | ICD-10-CM | POA: Diagnosis not present

## 2024-05-01 DIAGNOSIS — Z556 Problems related to health literacy: Secondary | ICD-10-CM | POA: Diagnosis not present

## 2024-05-01 DIAGNOSIS — D649 Anemia, unspecified: Secondary | ICD-10-CM | POA: Diagnosis not present

## 2024-05-01 DIAGNOSIS — Z8673 Personal history of transient ischemic attack (TIA), and cerebral infarction without residual deficits: Secondary | ICD-10-CM | POA: Diagnosis not present

## 2024-05-01 DIAGNOSIS — E119 Type 2 diabetes mellitus without complications: Secondary | ICD-10-CM | POA: Diagnosis not present

## 2024-05-01 DIAGNOSIS — I97638 Postprocedural hematoma of a circulatory system organ or structure following other circulatory system procedure: Secondary | ICD-10-CM | POA: Diagnosis not present

## 2024-05-01 DIAGNOSIS — Z7901 Long term (current) use of anticoagulants: Secondary | ICD-10-CM | POA: Diagnosis not present

## 2024-05-01 DIAGNOSIS — Z85528 Personal history of other malignant neoplasm of kidney: Secondary | ICD-10-CM | POA: Diagnosis not present

## 2024-05-01 DIAGNOSIS — Z7984 Long term (current) use of oral hypoglycemic drugs: Secondary | ICD-10-CM | POA: Diagnosis not present

## 2024-05-01 DIAGNOSIS — Z85828 Personal history of other malignant neoplasm of skin: Secondary | ICD-10-CM | POA: Diagnosis not present

## 2024-05-01 DIAGNOSIS — I2510 Atherosclerotic heart disease of native coronary artery without angina pectoris: Secondary | ICD-10-CM | POA: Diagnosis not present

## 2024-05-01 DIAGNOSIS — J45909 Unspecified asthma, uncomplicated: Secondary | ICD-10-CM | POA: Diagnosis not present

## 2024-05-01 DIAGNOSIS — Z7951 Long term (current) use of inhaled steroids: Secondary | ICD-10-CM | POA: Diagnosis not present

## 2024-05-04 DIAGNOSIS — I97638 Postprocedural hematoma of a circulatory system organ or structure following other circulatory system procedure: Secondary | ICD-10-CM | POA: Diagnosis not present

## 2024-05-04 DIAGNOSIS — I480 Paroxysmal atrial fibrillation: Secondary | ICD-10-CM | POA: Diagnosis not present

## 2024-05-04 DIAGNOSIS — E039 Hypothyroidism, unspecified: Secondary | ICD-10-CM | POA: Diagnosis not present

## 2024-05-04 DIAGNOSIS — E119 Type 2 diabetes mellitus without complications: Secondary | ICD-10-CM | POA: Diagnosis not present

## 2024-05-04 DIAGNOSIS — Z79899 Other long term (current) drug therapy: Secondary | ICD-10-CM | POA: Diagnosis not present

## 2024-05-04 DIAGNOSIS — Z556 Problems related to health literacy: Secondary | ICD-10-CM | POA: Diagnosis not present

## 2024-05-04 DIAGNOSIS — Z7901 Long term (current) use of anticoagulants: Secondary | ICD-10-CM | POA: Diagnosis not present

## 2024-05-04 DIAGNOSIS — Z7951 Long term (current) use of inhaled steroids: Secondary | ICD-10-CM | POA: Diagnosis not present

## 2024-05-04 DIAGNOSIS — Z85528 Personal history of other malignant neoplasm of kidney: Secondary | ICD-10-CM | POA: Diagnosis not present

## 2024-05-04 DIAGNOSIS — Z952 Presence of prosthetic heart valve: Secondary | ICD-10-CM | POA: Diagnosis not present

## 2024-05-04 DIAGNOSIS — I951 Orthostatic hypotension: Secondary | ICD-10-CM | POA: Diagnosis not present

## 2024-05-04 DIAGNOSIS — Z8673 Personal history of transient ischemic attack (TIA), and cerebral infarction without residual deficits: Secondary | ICD-10-CM | POA: Diagnosis not present

## 2024-05-04 DIAGNOSIS — Z7984 Long term (current) use of oral hypoglycemic drugs: Secondary | ICD-10-CM | POA: Diagnosis not present

## 2024-05-04 DIAGNOSIS — Z8546 Personal history of malignant neoplasm of prostate: Secondary | ICD-10-CM | POA: Diagnosis not present

## 2024-05-04 DIAGNOSIS — I2510 Atherosclerotic heart disease of native coronary artery without angina pectoris: Secondary | ICD-10-CM | POA: Diagnosis not present

## 2024-05-04 DIAGNOSIS — I6521 Occlusion and stenosis of right carotid artery: Secondary | ICD-10-CM | POA: Diagnosis not present

## 2024-05-04 DIAGNOSIS — I10 Essential (primary) hypertension: Secondary | ICD-10-CM | POA: Diagnosis not present

## 2024-05-04 DIAGNOSIS — Z85828 Personal history of other malignant neoplasm of skin: Secondary | ICD-10-CM | POA: Diagnosis not present

## 2024-05-04 DIAGNOSIS — D649 Anemia, unspecified: Secondary | ICD-10-CM | POA: Diagnosis not present

## 2024-05-04 DIAGNOSIS — J45909 Unspecified asthma, uncomplicated: Secondary | ICD-10-CM | POA: Diagnosis not present

## 2024-05-05 DIAGNOSIS — L821 Other seborrheic keratosis: Secondary | ICD-10-CM | POA: Diagnosis not present

## 2024-05-05 DIAGNOSIS — I97638 Postprocedural hematoma of a circulatory system organ or structure following other circulatory system procedure: Secondary | ICD-10-CM | POA: Diagnosis not present

## 2024-05-05 DIAGNOSIS — Z8546 Personal history of malignant neoplasm of prostate: Secondary | ICD-10-CM | POA: Diagnosis not present

## 2024-05-05 DIAGNOSIS — I10 Essential (primary) hypertension: Secondary | ICD-10-CM | POA: Diagnosis not present

## 2024-05-05 DIAGNOSIS — Z556 Problems related to health literacy: Secondary | ICD-10-CM | POA: Diagnosis not present

## 2024-05-05 DIAGNOSIS — I6521 Occlusion and stenosis of right carotid artery: Secondary | ICD-10-CM | POA: Diagnosis not present

## 2024-05-05 DIAGNOSIS — Z79899 Other long term (current) drug therapy: Secondary | ICD-10-CM | POA: Diagnosis not present

## 2024-05-05 DIAGNOSIS — Z7984 Long term (current) use of oral hypoglycemic drugs: Secondary | ICD-10-CM | POA: Diagnosis not present

## 2024-05-05 DIAGNOSIS — Z7951 Long term (current) use of inhaled steroids: Secondary | ICD-10-CM | POA: Diagnosis not present

## 2024-05-05 DIAGNOSIS — L578 Other skin changes due to chronic exposure to nonionizing radiation: Secondary | ICD-10-CM | POA: Diagnosis not present

## 2024-05-05 DIAGNOSIS — L570 Actinic keratosis: Secondary | ICD-10-CM | POA: Diagnosis not present

## 2024-05-05 DIAGNOSIS — J45909 Unspecified asthma, uncomplicated: Secondary | ICD-10-CM | POA: Diagnosis not present

## 2024-05-05 DIAGNOSIS — Z85828 Personal history of other malignant neoplasm of skin: Secondary | ICD-10-CM | POA: Diagnosis not present

## 2024-05-05 DIAGNOSIS — Z8673 Personal history of transient ischemic attack (TIA), and cerebral infarction without residual deficits: Secondary | ICD-10-CM | POA: Diagnosis not present

## 2024-05-05 DIAGNOSIS — I2510 Atherosclerotic heart disease of native coronary artery without angina pectoris: Secondary | ICD-10-CM | POA: Diagnosis not present

## 2024-05-05 DIAGNOSIS — Z85528 Personal history of other malignant neoplasm of kidney: Secondary | ICD-10-CM | POA: Diagnosis not present

## 2024-05-05 DIAGNOSIS — I480 Paroxysmal atrial fibrillation: Secondary | ICD-10-CM | POA: Diagnosis not present

## 2024-05-05 DIAGNOSIS — E039 Hypothyroidism, unspecified: Secondary | ICD-10-CM | POA: Diagnosis not present

## 2024-05-05 DIAGNOSIS — Z952 Presence of prosthetic heart valve: Secondary | ICD-10-CM | POA: Diagnosis not present

## 2024-05-05 DIAGNOSIS — D649 Anemia, unspecified: Secondary | ICD-10-CM | POA: Diagnosis not present

## 2024-05-05 DIAGNOSIS — I951 Orthostatic hypotension: Secondary | ICD-10-CM | POA: Diagnosis not present

## 2024-05-05 DIAGNOSIS — E119 Type 2 diabetes mellitus without complications: Secondary | ICD-10-CM | POA: Diagnosis not present

## 2024-05-05 DIAGNOSIS — Z7901 Long term (current) use of anticoagulants: Secondary | ICD-10-CM | POA: Diagnosis not present

## 2024-05-08 DIAGNOSIS — Z7901 Long term (current) use of anticoagulants: Secondary | ICD-10-CM | POA: Diagnosis not present

## 2024-05-08 DIAGNOSIS — Z8673 Personal history of transient ischemic attack (TIA), and cerebral infarction without residual deficits: Secondary | ICD-10-CM | POA: Diagnosis not present

## 2024-05-08 DIAGNOSIS — Z7951 Long term (current) use of inhaled steroids: Secondary | ICD-10-CM | POA: Diagnosis not present

## 2024-05-08 DIAGNOSIS — I6521 Occlusion and stenosis of right carotid artery: Secondary | ICD-10-CM | POA: Diagnosis not present

## 2024-05-08 DIAGNOSIS — Z79899 Other long term (current) drug therapy: Secondary | ICD-10-CM | POA: Diagnosis not present

## 2024-05-08 DIAGNOSIS — Z952 Presence of prosthetic heart valve: Secondary | ICD-10-CM | POA: Diagnosis not present

## 2024-05-08 DIAGNOSIS — E119 Type 2 diabetes mellitus without complications: Secondary | ICD-10-CM | POA: Diagnosis not present

## 2024-05-08 DIAGNOSIS — I10 Essential (primary) hypertension: Secondary | ICD-10-CM | POA: Diagnosis not present

## 2024-05-08 DIAGNOSIS — I480 Paroxysmal atrial fibrillation: Secondary | ICD-10-CM | POA: Diagnosis not present

## 2024-05-08 DIAGNOSIS — Z85528 Personal history of other malignant neoplasm of kidney: Secondary | ICD-10-CM | POA: Diagnosis not present

## 2024-05-08 DIAGNOSIS — Z8546 Personal history of malignant neoplasm of prostate: Secondary | ICD-10-CM | POA: Diagnosis not present

## 2024-05-08 DIAGNOSIS — Z556 Problems related to health literacy: Secondary | ICD-10-CM | POA: Diagnosis not present

## 2024-05-08 DIAGNOSIS — Z7984 Long term (current) use of oral hypoglycemic drugs: Secondary | ICD-10-CM | POA: Diagnosis not present

## 2024-05-08 DIAGNOSIS — I2510 Atherosclerotic heart disease of native coronary artery without angina pectoris: Secondary | ICD-10-CM | POA: Diagnosis not present

## 2024-05-08 DIAGNOSIS — I951 Orthostatic hypotension: Secondary | ICD-10-CM | POA: Diagnosis not present

## 2024-05-08 DIAGNOSIS — J45909 Unspecified asthma, uncomplicated: Secondary | ICD-10-CM | POA: Diagnosis not present

## 2024-05-08 DIAGNOSIS — Z85828 Personal history of other malignant neoplasm of skin: Secondary | ICD-10-CM | POA: Diagnosis not present

## 2024-05-08 DIAGNOSIS — E039 Hypothyroidism, unspecified: Secondary | ICD-10-CM | POA: Diagnosis not present

## 2024-05-08 DIAGNOSIS — D649 Anemia, unspecified: Secondary | ICD-10-CM | POA: Diagnosis not present

## 2024-05-08 DIAGNOSIS — I97638 Postprocedural hematoma of a circulatory system organ or structure following other circulatory system procedure: Secondary | ICD-10-CM | POA: Diagnosis not present

## 2024-05-12 DIAGNOSIS — I10 Essential (primary) hypertension: Secondary | ICD-10-CM | POA: Diagnosis not present

## 2024-05-12 DIAGNOSIS — I951 Orthostatic hypotension: Secondary | ICD-10-CM | POA: Diagnosis not present

## 2024-05-12 DIAGNOSIS — Z7951 Long term (current) use of inhaled steroids: Secondary | ICD-10-CM | POA: Diagnosis not present

## 2024-05-12 DIAGNOSIS — I6521 Occlusion and stenosis of right carotid artery: Secondary | ICD-10-CM | POA: Diagnosis not present

## 2024-05-12 DIAGNOSIS — I2510 Atherosclerotic heart disease of native coronary artery without angina pectoris: Secondary | ICD-10-CM | POA: Diagnosis not present

## 2024-05-12 DIAGNOSIS — Z952 Presence of prosthetic heart valve: Secondary | ICD-10-CM | POA: Diagnosis not present

## 2024-05-12 DIAGNOSIS — Z556 Problems related to health literacy: Secondary | ICD-10-CM | POA: Diagnosis not present

## 2024-05-12 DIAGNOSIS — D649 Anemia, unspecified: Secondary | ICD-10-CM | POA: Diagnosis not present

## 2024-05-12 DIAGNOSIS — I480 Paroxysmal atrial fibrillation: Secondary | ICD-10-CM | POA: Diagnosis not present

## 2024-05-12 DIAGNOSIS — Z8673 Personal history of transient ischemic attack (TIA), and cerebral infarction without residual deficits: Secondary | ICD-10-CM | POA: Diagnosis not present

## 2024-05-12 DIAGNOSIS — J45909 Unspecified asthma, uncomplicated: Secondary | ICD-10-CM | POA: Diagnosis not present

## 2024-05-12 DIAGNOSIS — E039 Hypothyroidism, unspecified: Secondary | ICD-10-CM | POA: Diagnosis not present

## 2024-05-12 DIAGNOSIS — Z7984 Long term (current) use of oral hypoglycemic drugs: Secondary | ICD-10-CM | POA: Diagnosis not present

## 2024-05-12 DIAGNOSIS — Z85528 Personal history of other malignant neoplasm of kidney: Secondary | ICD-10-CM | POA: Diagnosis not present

## 2024-05-12 DIAGNOSIS — Z8546 Personal history of malignant neoplasm of prostate: Secondary | ICD-10-CM | POA: Diagnosis not present

## 2024-05-12 DIAGNOSIS — Z7901 Long term (current) use of anticoagulants: Secondary | ICD-10-CM | POA: Diagnosis not present

## 2024-05-12 DIAGNOSIS — Z85828 Personal history of other malignant neoplasm of skin: Secondary | ICD-10-CM | POA: Diagnosis not present

## 2024-05-12 DIAGNOSIS — I97638 Postprocedural hematoma of a circulatory system organ or structure following other circulatory system procedure: Secondary | ICD-10-CM | POA: Diagnosis not present

## 2024-05-12 DIAGNOSIS — Z79899 Other long term (current) drug therapy: Secondary | ICD-10-CM | POA: Diagnosis not present

## 2024-05-12 DIAGNOSIS — E119 Type 2 diabetes mellitus without complications: Secondary | ICD-10-CM | POA: Diagnosis not present

## 2024-05-13 NOTE — Progress Notes (Signed)
 Remote PPM Transmission

## 2024-05-20 NOTE — Progress Notes (Unsigned)
 Office Note    HPI: Bruce Nunez is a 83 y.o. ([birth year]) male presenting s/p left carotid enterectomy for symptomatic ICA stenosis. Patient had fullness in the neck postoperatively from surgiflo product.  JP had minimal output, and was pulled.  Patient presented to [facility] due to concern for hematoma, however it was noted to be the same size.  He was not admitted.  Since surgery, [name] has done well.  He is in no complaints.  Denies symptoms of TIA, stroke, amaurosis.  Has no sensorimotor deficits from surgery. ____  Originally from [place] , Bruce Nunez worked as a Solicitor in Designer, fashion/clothing, moving to [employer] prior to retirement. Patient was seen in 2023 and 2020 for due to concern for stroke.  CT demonstrated no concerns.  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 CTA demonstrated no concern for flow-limiting stenosis in the right internal carotid artery.  Left internal carotid artery demonstrated significant stenosis, 65 to 70%. At his prior visit he noted 2 episodes of right-sided head tingling that continued into the arm.  This resolved after several minutes.     Recent surgical history includes CABG, aortic valve replacement, pacemaker, completed in November 2024 by Dr. 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   Heart murmur    Hemorrhoids     History of kidney cancer    History of kidney stones    Hx of colon cancer, stage I    Hx of nonmelanoma skin cancer    Hx of peptic ulcer    as teenager   Hx of transfusion    Hyperlipidemia    Hypertension    Inguinal hernia    Orthostatic hypotension 01/06/2024   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   PAF (paroxysmal atrial fibrillation) (HCC) 08/20/2023   PONV (postoperative nausea and vomiting)    only when had Ether as Anesthesia drug   Prostate cancer (HCC) 08/15/2012   also Hx colon cancer / Hx skin cancer   Right inguinal hernia 05/08/2016   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   S/P right inguinal hernia repair Sept 2015 05/17/2014   Recurrent right indirect  Hernia treated with robotic TAPP repair with enterolysis Sept 2017   Stroke [facility]) 03/28/2022   Syncope 08/17/2023    Past Surgical History:  Procedure Laterality Date   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   Procedure: AORTIC VALVE REPLACEMENT WITH INSPIRIS AORTIC VALVE;  Surgeon: Maryjane Mt, MD;  Location: [facility] OR;  Service: Open Heart Surgery;  Laterality: N/A;   cataract surgery  2009   CORONARY ARTERY BYPASS GRAFT N/A 07/05/2023   Procedure: CORONARY ARTERY BYPASS GRAFTING, USING LEFT INTERNAL MAMMARY ARTERY;  Surgeon: Maryjane Mt, MD;  Location: [facility] OR;  Service: Open Heart Surgery;  Laterality: N/A;   CYSTOSCOPY  ENDARTERECTOMY Left 04/10/2024   Procedure: LEFT CAROTID ENDARTERECTOMY;  Surgeon: Lanis Fonda BRAVO, MD;  Location: [facility] OR;  Service: Vascular;  Laterality: Left;   INGUINAL HERNIA REPAIR Right 05/17/2014   Procedure: RIGHT INGUINAL HERNIA REPAIR ;  Surgeon: Adina Lunger, MD;  Location: [facility] ORS;  Service: General;  Laterality: Right;  With MESH   KNEE SURGERY Right 2012   LEFT HEART CATH AND CORONARY ANGIOGRAPHY N/A 05/03/2023   Procedure: LEFT HEART CATH AND CORONARY ANGIOGRAPHY;  Surgeon: Wonda Sharper, MD;  Location: [facility] INVASIVE CV LAB;  Service: Cardiovascular;   Laterality: N/A;   PACEMAKER IMPLANT N/A 07/17/2023   Procedure: PACEMAKER IMPLANT;  Surgeon: Inocencio Soyla Lunger, MD;  Location: [facility] INVASIVE CV LAB;  Service: Cardiovascular;  Laterality: N/A;   PARTIAL COLECTOMY  2005   PATCH ANGIOPLASTY Left 04/10/2024   Procedure: PATCH ANGIOPLASTY, USING GEORGE BIOLOGIC PATCH;  Surgeon: Lanis Fonda BRAVO, MD;  Location: [facility] OR;  Service: Vascular;  Laterality: Left;   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   RADIOACTIVE SEED IMPLANT N/A 11/24/2012   Procedure: RADIOACTIVE SEED IMPLANT;  Surgeon: Alm GORMAN Fragmin, MD;  Location: [facility];  Service: Urology;  Laterality: N/A;   71seeds implanted  no seeds found in bladder   TEE WITHOUT CARDIOVERSION N/A 07/05/2023   Procedure: TRANSESOPHAGEAL ECHOCARDIOGRAM;  Surgeon: Maryjane Mt, MD;  Location: [facility] OR;  Service: Open Heart Surgery;  Laterality: N/A;   THROAT SURGERY  1940's   as child (age 24)growth that created fissure/corrective surgery   TONSILLECTOMY     age 36   XI ROBOTIC ASSISTED INGUINAL HERNIA REPAIR WITH MESH Right 05/08/2016   Procedure: XI ROBOTIC ASSISTED REPAIR OF RECURRENT RIGHT INGUINAL HERNIA;  Surgeon: Donnice Lunger, MD;  Location: [facility] ORS;  Service: General;  Laterality: Right;  With MESH    Social History   Socioeconomic History   Marital status: Married    Spouse name: Not on file   Number of children: 2   Years of education: Not on file   Highest education level: Not on file  Occupational History   Occupation: Retired    Comment: Designer, fashion/clothing  Tobacco Use   Smoking status: Never    Passive exposure: Never   Smokeless tobacco: Never  Vaping Use   Vaping status: Never Used  Substance and Sexual Activity   Alcohol use: No   Drug use: No   Sexual activity: Not on file  Other Topics Concern   Not on file  Social History Narrative   Not on file   Social Drivers of Health   Financial Resource Strain: Not on file  Food Insecurity: No Food Insecurity  (08/17/2023)   Hunger Vital Sign    Worried About Running Out of Food in the Last Year: Never true    Ran Out of Food in the Last Year: Never true  Transportation Needs: No Transportation Needs (08/17/2023)   PRAPARE - Administrator, Civil Service (Medical): No    Lack of Transportation (Non-Medical): No  Physical Activity: Not on file  Stress: Not on file  Social Connections: Not on file  Intimate Partner Violence: Not At Risk (08/17/2023)   Humiliation, Afraid, Rape, and Kick questionnaire    Fear of Current or Ex-Partner: No    Emotionally Abused: No    Physically Abused: No    Sexually Abused: No   Family History  Problem Relation Age of Onset   Heart attack Other    Brain cancer  Other    Coronary artery disease Other    Prostate cancer Other     Current Outpatient Medications  Medication Sig Dispense Refill   acetaminophen  (TYLENOL ) 325 MG tablet Take 1-2 tablets (325-650 mg total) by mouth every 6 (six) hours as needed for mild pain (pain score 1-3).     albuterol  (PROVENTIL  HFA;VENTOLIN  HFA) 108 (90 BASE) MCG/ACT inhaler Inhale 2 puffs into the lungs every 6 (six) hours as needed for shortness of breath.     apixaban  (ELIQUIS ) 5 MG TABS tablet Take 1 tablet (5 mg total) by mouth 2 (two) times daily.     budesonide -formoterol  (SYMBICORT) 160-4.5 MCG/ACT inhaler Inhale 2 puffs into the lungs 2 (two) times daily.     cyanocobalamin  (,VITAMIN B-12,) 1000 MCG/ML injection Inject 1,000 mcg into the muscle every 30 (thirty) days.     empagliflozin  (JARDIANCE ) 10 MG TABS tablet Take 1 tablet (10 mg total) by mouth daily. 90 tablet 2   fexofenadine (ALLEGRA) 180 MG tablet Take 180 mg by mouth daily.     fluticasone  (FLONASE ) 50 MCG/ACT nasal spray Place 2 sprays into both nostrils 2 (two) times daily.     levothyroxine  (SYNTHROID ) 75 MCG tablet Take 75 mcg by mouth daily before breakfast.     metoprolol  succinate (TOPROL -XL) 25 MG 24 hr tablet Take 1 tablet (25 mg  total) by mouth daily. Take with or immediately following a meal. 90 tablet 3   midodrine  (PROAMATINE ) 5 MG tablet Take 1 tablet (5 mg total) by mouth 3 (three) times daily with meals. Take the third dose of this medication at 3 pm 270 tablet 2   oxyCODONE  (OXY IR/ROXICODONE ) 5 MG immediate release tablet Take 1-2 tablets (5-10 mg total) by mouth every 4 (four) hours as needed for moderate pain (pain score 4-6). 15 tablet 0   pantoprazole  (PROTONIX ) 20 MG tablet Take 20 mg by mouth daily.     rosuvastatin  (CRESTOR ) 20 MG tablet Take 1 tablet (20 mg total) by mouth daily. 90 tablet 3   solifenacin (VESICARE) 10 MG tablet Take 10 mg by mouth daily.     tamsulosin  (FLOMAX ) 0.4 MG CAPS capsule Take 1 capsule (0.4 mg total) by mouth daily. 90 capsule 3   Current Facility-Administered Medications  Medication Dose Route Frequency Provider Last Rate Last Admin   aspirin  EC tablet 81 mg  81 mg Oral Daily        pantoprazole  (PROTONIX ) EC tablet 20 mg  20 mg Oral Daily         Allergies  Allergen Reactions   Penicillins Anaphylaxis, Shortness Of Breath, Itching and Swelling    Throat swells up but no intubation per wife   Iodinated Contrast Media Hives and Rash   Asa [Aspirin ] Other (See Comments)    Hx of GI bleeds   Metformin And Related Diarrhea   Monodox [Doxycycline Hyclate] Nausea Only   Sulfa Antibiotics Itching and Swelling   Ultram [Tramadol] Nausea And Vomiting     REVIEW OF SYSTEMS:  [X]  denotes positive finding, [ ]  denotes negative finding Cardiac  Comments:  Chest pain or chest pressure:    Shortness of breath upon exertion:    Short of breath when lying flat:    Irregular heart rhythm:        Vascular    Pain in calf, thigh, or hip brought on by ambulation:    Pain in feet at night that wakes you up from your sleep:     Blood  clot in your veins:    Leg swelling:         Pulmonary    Oxygen  at home:    Productive cough:     Wheezing:         Neurologic    Sudden  weakness in arms or legs:     Sudden numbness in arms or legs:     Sudden onset of difficulty speaking or slurred speech:    Temporary loss of vision in one eye:     Problems with dizziness:         Gastrointestinal    Blood in stool:     Vomited blood:         Genitourinary    Burning when urinating:     Blood in urine:        Psychiatric    Major depression:         Hematologic    Bleeding problems:    Problems with blood clotting too easily:        Skin    Rashes or ulcers:        Constitutional    Fever or chills:      PHYSICAL EXAMINATION:  There were no vitals filed for this visit.   General:  WDWN in NAD; vital signs documented above Gait: Not observed HENT: WNL, normocephalic Pulmonary: normal non-labored breathing , without wheezing Cardiac: regular HR Abdomen: soft, NT, no masses Skin: without rashes Vascular Exam/Pulses:  Right Left  Radial 2+ (normal) 2+ (normal)  Ulnar    Femoral    Popliteal    DP 2+ (normal) 2+ (normal)  PT     Extremities: without ischemic changes, without Gangrene , without cellulitis; without open wounds;  Musculoskeletal: no muscle wasting or atrophy  Neurologic: A&O X 3;  No focal weakness or paresthesias are detected Psychiatric:  The pt has Normal affect.   Non-Invasive Vascular Imaging:    -  ASSESSMENT/PLAN: SQUARE JOWETT is a 83 y.o. male presenting status post CEA for symptomatic left-sided ICA stenosis.  ***   Fonda FORBES Rim, MD [facility] [phone]

## 2024-05-21 ENCOUNTER — Encounter: Payer: Self-pay | Admitting: Vascular Surgery

## 2024-05-21 ENCOUNTER — Ambulatory Visit: Attending: Vascular Surgery | Admitting: Vascular Surgery

## 2024-05-21 VITALS — BP 137/81 | HR 74 | Temp 98.2°F | Resp 18 | Ht 67.0 in | Wt 142.1 lb

## 2024-05-21 DIAGNOSIS — Z9889 Other specified postprocedural states: Secondary | ICD-10-CM

## 2024-05-22 ENCOUNTER — Other Ambulatory Visit: Payer: Self-pay | Admitting: *Deleted

## 2024-05-22 DIAGNOSIS — Z9889 Other specified postprocedural states: Secondary | ICD-10-CM

## 2024-05-26 ENCOUNTER — Telehealth: Payer: Self-pay | Admitting: Cardiology

## 2024-05-26 NOTE — Telephone Encounter (Signed)
 Pt c/o Shortness Of Breath: STAT if SOB developed within the last 24 hours or pt is noticeably SOB on the phone  1. Are you currently SOB (can you hear that pt is SOB on the phone)? no  2. How long have you been experiencing SOB?  For well now (its just getting worse)  3. Are you SOB when sitting or when up moving around? Moving around  4. Are you currently experiencing any other symptoms? Bp 103/45; pressure in chest a little

## 2024-05-27 NOTE — Telephone Encounter (Signed)
 Left message for the patient to call back.

## 2024-05-28 ENCOUNTER — Observation Stay (HOSPITAL_BASED_OUTPATIENT_CLINIC_OR_DEPARTMENT_OTHER)
Admission: EM | Admit: 2024-05-28 | Discharge: 2024-05-30 | Disposition: A | Discharge status: Home / Self Care | Source: Ambulatory Visit | Attending: Internal Medicine | Admitting: Internal Medicine

## 2024-05-28 ENCOUNTER — Emergency Department (HOSPITAL_BASED_OUTPATIENT_CLINIC_OR_DEPARTMENT_OTHER)

## 2024-05-28 ENCOUNTER — Other Ambulatory Visit: Payer: Self-pay

## 2024-05-28 ENCOUNTER — Encounter (HOSPITAL_BASED_OUTPATIENT_CLINIC_OR_DEPARTMENT_OTHER): Payer: Self-pay

## 2024-05-28 DIAGNOSIS — Z8679 Personal history of other diseases of the circulatory system: Secondary | ICD-10-CM | POA: Diagnosis not present

## 2024-05-28 DIAGNOSIS — Z952 Presence of prosthetic heart valve: Secondary | ICD-10-CM | POA: Diagnosis not present

## 2024-05-28 DIAGNOSIS — I2510 Atherosclerotic heart disease of native coronary artery without angina pectoris: Secondary | ICD-10-CM | POA: Diagnosis not present

## 2024-05-28 DIAGNOSIS — Z7901 Long term (current) use of anticoagulants: Secondary | ICD-10-CM | POA: Diagnosis not present

## 2024-05-28 DIAGNOSIS — Z85828 Personal history of other malignant neoplasm of skin: Secondary | ICD-10-CM | POA: Insufficient documentation

## 2024-05-28 DIAGNOSIS — R0609 Other forms of dyspnea: Principal | ICD-10-CM | POA: Diagnosis present

## 2024-05-28 DIAGNOSIS — R0602 Shortness of breath: Principal | ICD-10-CM

## 2024-05-28 DIAGNOSIS — Z8546 Personal history of malignant neoplasm of prostate: Secondary | ICD-10-CM | POA: Diagnosis not present

## 2024-05-28 DIAGNOSIS — I110 Hypertensive heart disease with heart failure: Secondary | ICD-10-CM | POA: Diagnosis not present

## 2024-05-28 DIAGNOSIS — Z951 Presence of aortocoronary bypass graft: Secondary | ICD-10-CM | POA: Diagnosis not present

## 2024-05-28 DIAGNOSIS — J45909 Unspecified asthma, uncomplicated: Secondary | ICD-10-CM | POA: Diagnosis not present

## 2024-05-28 DIAGNOSIS — N400 Enlarged prostate without lower urinary tract symptoms: Secondary | ICD-10-CM | POA: Insufficient documentation

## 2024-05-28 DIAGNOSIS — Z8673 Personal history of transient ischemic attack (TIA), and cerebral infarction without residual deficits: Secondary | ICD-10-CM | POA: Diagnosis not present

## 2024-05-28 DIAGNOSIS — Z7982 Long term (current) use of aspirin: Secondary | ICD-10-CM | POA: Insufficient documentation

## 2024-05-28 DIAGNOSIS — I480 Paroxysmal atrial fibrillation: Secondary | ICD-10-CM | POA: Insufficient documentation

## 2024-05-28 DIAGNOSIS — Z85038 Personal history of other malignant neoplasm of large intestine: Secondary | ICD-10-CM | POA: Insufficient documentation

## 2024-05-28 DIAGNOSIS — Z96651 Presence of right artificial knee joint: Secondary | ICD-10-CM | POA: Diagnosis not present

## 2024-05-28 DIAGNOSIS — Z85528 Personal history of other malignant neoplasm of kidney: Secondary | ICD-10-CM | POA: Diagnosis not present

## 2024-05-28 DIAGNOSIS — Z79899 Other long term (current) drug therapy: Secondary | ICD-10-CM | POA: Diagnosis not present

## 2024-05-28 DIAGNOSIS — Z950 Presence of cardiac pacemaker: Secondary | ICD-10-CM | POA: Insufficient documentation

## 2024-05-28 DIAGNOSIS — I5030 Unspecified diastolic (congestive) heart failure: Secondary | ICD-10-CM | POA: Diagnosis not present

## 2024-05-28 HISTORY — DX: Other forms of dyspnea: R06.09

## 2024-05-28 LAB — CBC
HCT: 35.9 % — ABNORMAL LOW (ref 39.0–52.0)
Hemoglobin: 10.2 g/dL — ABNORMAL LOW (ref 13.0–17.0)
MCH: 19.5 pg — ABNORMAL LOW (ref 26.0–34.0)
MCHC: 28.4 g/dL — ABNORMAL LOW (ref 30.0–36.0)
MCV: 68.5 fL — ABNORMAL LOW (ref 80.0–100.0)
Platelets: 211 K/uL (ref 150–400)
RBC: 5.24 MIL/uL (ref 4.22–5.81)
RDW: 17.3 % — ABNORMAL HIGH (ref 11.5–15.5)
WBC: 7.1 K/uL (ref 4.0–10.5)
nRBC: 0 % (ref 0.0–0.2)

## 2024-05-28 LAB — TROPONIN T, HIGH SENSITIVITY
Troponin T High Sensitivity: 32 ng/L — ABNORMAL HIGH (ref 0–19)
Troponin T High Sensitivity: 28 ng/L — ABNORMAL HIGH (ref 0–19)

## 2024-05-28 LAB — BASIC METABOLIC PANEL WITH GFR
Anion gap: 11 (ref 5–15)
BUN: 23 mg/dL (ref 8–23)
CO2: 24 mmol/L (ref 22–32)
Calcium: 9.6 mg/dL (ref 8.9–10.3)
Chloride: 105 mmol/L (ref 98–111)
Creatinine, Ser: 0.98 mg/dL (ref 0.61–1.24)
GFR, Estimated: >60 mL/min (ref >60)
Glucose, Bld: 115 mg/dL — ABNORMAL HIGH (ref 70–99)
Potassium: 4.7 mmol/L (ref 3.5–5.1)
Sodium: 139 mmol/L (ref 135–145)

## 2024-05-28 LAB — PRO BRAIN NATRIURETIC PEPTIDE: Pro Brain Natriuretic Peptide: 160 pg/mL (ref <300.0)

## 2024-05-28 NOTE — ED Notes (Signed)
 Attempted to interrogate [employer] pacemaker multiple times without success. [employer] rep paged.

## 2024-05-28 NOTE — ED Provider Notes (Signed)
 [place] [facility] Provider Note   CSN: [id number] Arrival date & time: 05/28/24  1719     Patient presents with: Fatigue   Bruce Nunez is a 83 y.o. male.   Patient is a 83 year old male who presents with increased shortness of breath.  He has a history of hypertension,, hyperlipidemia.  He had coronary artery bypass graft and aortic valve replacement in November 2024.  This was complicated by atrial fibrillation and strokes.  He had pacemaker placed at the same time.  He is on Eliquis  for A-fib.  He also had a carotid endarterectomy in August of this year.  He has had trouble with orthostatic hypotension and is currently on midodrine .  He said recently he has been having some increased presyncopal events.  This did occur prior to him being admitted for his bypass surgery/aortic valve replacement.  They have recently started getting more frequent and he last had 1 on Tuesday of this week.  He says it starts by his right arm getting hot and on Tuesday he felt like his pacemaker may be kicked in and he had a near syncopal event.  He did not lose consciousness.  He says also recently had some worsening shortness of breath over the last few days.  He gets short of breath with minimal exertion.  He denies any cough or cold symptoms.  No increased leg swelling.No associated chest discomfort.  No fevers.  No vomiting or diarrhea.       Prior to Admission medications   Medication Sig Start Date End Date Taking? Authorizing Provider  acetaminophen  (TYLENOL ) 325 MG tablet Take 1-2 tablets (325-650 mg total) by mouth every 6 (six) hours as needed for mild pain (pain score 1-3). 07/19/23   Raguel Con RAMAN, PA-C  albuterol  (PROVENTIL  HFA;VENTOLIN  HFA) 108 (90 BASE) MCG/ACT inhaler Inhale 2 puffs into the lungs every 6 (six) hours as needed for shortness of breath.    [provider]  apixaban  (ELIQUIS ) 5 MG TABS tablet Take 1 tablet (5 mg total) by  mouth 2 (two) times daily. 04/16/24   Schuh, McKenzi P, PA-C  budesonide -formoterol  (SYMBICORT) 160-4.5 MCG/ACT inhaler Inhale 2 puffs into the lungs 2 (two) times daily. 03/08/23   [provider]  cyanocobalamin  (,VITAMIN B-12,) 1000 MCG/ML injection Inject 1,000 mcg into the muscle every 30 (thirty) days. 02/04/19   [provider]  empagliflozin  (JARDIANCE ) 10 MG TABS tablet Take 1 tablet (10 mg total) by mouth daily. 08/21/23   Parcells, Waddell LABOR, PA-C  fexofenadine (ALLEGRA) 180 MG tablet Take 180 mg by mouth daily.    [provider]  fluticasone  (FLONASE ) 50 MCG/ACT nasal spray Place 2 sprays into both nostrils 2 (two) times daily.    [provider]  levothyroxine  (SYNTHROID ) 75 MCG tablet Take 75 mcg by mouth daily before breakfast.    [provider]  metoprolol  succinate (TOPROL -XL) 25 MG 24 hr tablet Take 1 tablet (25 mg total) by mouth daily. Take with or immediately following a meal. 12/05/23   Leverne Charlies Helling, PA-C  midodrine  (PROAMATINE ) 5 MG tablet Take 1 tablet (5 mg total) by mouth 3 (three) times daily with meals. Take the third dose of this medication at 3 pm 11/19/23   [name] [name], MD  oxyCODONE  (OXY IR/ROXICODONE ) 5 MG immediate release tablet Take 1-2 tablets (5-10 mg total) by mouth every 4 (four) hours as needed for moderate pain (pain score 4-6). 04/11/24   Schuh, McKenzi  P, PA-C  pantoprazole  (PROTONIX ) 20 MG tablet Take 20 mg by mouth daily. 04/06/24   [provider]  rosuvastatin  (CRESTOR ) 20 MG tablet Take 1 tablet (20 mg total) by mouth daily. 01/07/24   Madireddy, Alean SAUNDERS, MD  solifenacin (VESICARE) 10 MG tablet Take 10 mg by mouth daily. 01/17/21   [provider]  tamsulosin  (FLOMAX ) 0.4 MG CAPS capsule Take 1 capsule (0.4 mg total) by mouth daily. 09/09/23   [name] [name], MD    Allergies: Penicillins, Iodinated contrast media, Asa [aspirin ], Metformin and related, Monodox [doxycycline hyclate],  Sulfa antibiotics, and Ultram [tramadol]    Review of Systems  Constitutional:  Positive for fatigue. Negative for chills, diaphoresis and fever.  HENT:  Negative for congestion, rhinorrhea and sneezing.   Eyes: Negative.   Respiratory:  Positive for shortness of breath. Negative for cough and chest tightness.   Cardiovascular:  Negative for chest pain and leg swelling.  Gastrointestinal:  Negative for abdominal pain, diarrhea, nausea and vomiting.  Genitourinary:  Negative for difficulty urinating, flank pain and frequency.  Musculoskeletal:  Negative for arthralgias and back pain.  Skin:  Negative for rash.  Neurological:  Positive for light-headedness. Negative for speech difficulty, weakness, numbness and headaches.    Updated Vital Signs BP (!) 167/72   Pulse 71   Temp 97.8 F (36.6 C) (Oral)   Resp 12   Ht 5' 7 (1.702 m)   Wt 64 kg   SpO2 100%   BMI 22.08 kg/m   Physical Exam Constitutional:      Appearance: He is well-developed.  HENT:     Head: Normocephalic and atraumatic.  Eyes:     Pupils: Pupils are equal, round, and reactive to light.  Cardiovascular:     Rate and Rhythm: Normal rate and regular rhythm.     Heart sounds: Murmur heard.  Pulmonary:     Effort: Pulmonary effort is normal. No respiratory distress.     Breath sounds: Normal breath sounds. No wheezing or rales.  Chest:     Chest wall: No tenderness.  Abdominal:     General: Bowel sounds are normal.     Palpations: Abdomen is soft.     Tenderness: There is no abdominal tenderness. There is no guarding or rebound.  Musculoskeletal:        General: Normal range of motion.     Cervical back: Normal range of motion and neck supple.     Comments: Trace edema to lower extremities bilaterally, no calf tenderness  Lymphadenopathy:     Cervical: No cervical adenopathy.  Skin:    General: Skin is warm and dry.     Findings: No rash.  Neurological:     Mental Status: He is alert and oriented to  person, place, and time.     (all labs ordered are listed, but only abnormal results are displayed) Labs Reviewed  BASIC METABOLIC PANEL WITH GFR - Abnormal; Notable for the following components:      Result Value   Glucose, Bld 115 (*)    All other components within normal limits  CBC - Abnormal; Notable for the following components:   Hemoglobin 10.2 (*)    HCT 35.9 (*)    MCV 68.5 (*)    MCH 19.5 (*)    MCHC 28.4 (*)    RDW 17.3 (*)    All other components within normal limits  TROPONIN T, HIGH SENSITIVITY - Abnormal; Notable for the following components:   Troponin  T High Sensitivity 32 (*)    All other components within normal limits  TROPONIN T, HIGH SENSITIVITY - Abnormal; Notable for the following components:   Troponin T High Sensitivity 28 (*)    All other components within normal limits  PRO BRAIN NATRIURETIC PEPTIDE    EKG: EKG Interpretation Date/Time:  Thursday May 28 2024 17:28:34 EDT Ventricular Rate:  68 PR Interval:  204 QRS Duration:  125 QT Interval:  427 QTC Calculation: 455 R Axis:   -22  Text Interpretation: Sinus rhythm Right bundle branch block since last tracing no significant change Confirmed by Lenor Hollering [id number]) on 05/28/2024 5:43:16 PM  Radiology: [facility] Chest 2 View Result Date: 05/28/2024 CLINICAL DATA:  shortness of breath EXAM: CHEST - 2 VIEW COMPARISON:  Chest x-ray 11/07/2023, CT chest 08/16/2023 FINDINGS: Left chest wall 2 lead pacemaker. The heart and mediastinal contours are unchanged. Atherosclerotic plaque. No focal consolidation. No pulmonary edema. No pleural effusion. No pneumothorax. No acute osseous abnormality.  Sternotomy wires are intact. IMPRESSION: No active cardiopulmonary disease. Electronically Signed   By: Morgane  Naveau M.D.   On: 05/28/2024 18:00     Procedures   Medications Ordered in the ED - No data to display                                  Medical Decision Making Amount and/or Complexity of Data  Reviewed Labs: ordered. Radiology: ordered.  Risk Decision regarding hospitalization.   This patient presents to the ED for concern of shortness of breath, this involves an extensive number of treatment options, and is a complaint that carries with it a high risk of complications and morbidity.  I considered the following differential and admission for this acute, potentially life threatening condition.  The differential diagnosis includes ACS, pulmonary edema, pericarditis, pericardial effusion, pneumonia, PE  MDM:    Patient is a 83 year old who presents with increased shortness of breath.  He has also had some near syncopal events but this has been going on for a while.  He does not have any associated chest discomfort.  His EKG does not show any ischemic changes.  His troponins are minimally elevated but flat.  BNP is normal.  He does not have other symptoms that would be more concerning for PE.  Discussed with cardiologist on-call, Dr. Gail who feels given patient's history, patient would benefit from inpatient treatment and cardiology will follow the patient.  Discussed with Dr. Franky who will admit the patient.  (Labs, imaging, consults)  Labs: I Ordered, and personally interpreted labs.  The pertinent results include: Minimally elevated troponins but flat, normal BNP, mild anemia  Imaging Studies ordered: I ordered imaging studies including chest x-ray I independently visualized and interpreted imaging. I agree with the radiologist interpretation  Additional history obtained from wife.  External records from outside source obtained and reviewed including history  Cardiac Monitoring: The patient was maintained on a cardiac monitor.  If on the cardiac monitor, I personally viewed and interpreted the cardiac monitored which showed an underlying rhythm of: Sinus rhythm  Reevaluation: After the interventions noted above, I reevaluated the patient and found that they have  :improved  Social Determinants of Health:    Disposition: Admit to hospital  Co morbidities that complicate the patient evaluation  Past Medical History:  Diagnosis Date   Anemia    recent iron supplement   Angina pectoris 05/02/2023   Aortic  regurgitation 03/12/2017   Aortic stenosis 04/29/2019   Asthma    childhood-   2013 bronchitis w/wheezing   Carotid artery occlusion    Complication of anesthesia     ether made me sick - woke up during colonoscopy   Diabetes mellitus without complication (HCC)    Endotracheally intubated 07/05/2023   Essential hypertension 03/12/2017   Frequency of urination    GERD (gastroesophageal reflux disease)    occasional   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   Heart murmur    Hemorrhoids    History of kidney cancer    History of kidney stones    Hx of colon cancer, stage I    Hx of nonmelanoma skin cancer    Hx of peptic ulcer    as teenager   Hx of transfusion    Hyperlipidemia    Hypertension    Inguinal hernia    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) 03/28/2022   Syncope 08/17/2023     Medicines No orders of the defined types were placed in this encounter.   I have reviewed the patients home medicines and have made adjustments as needed  Problem List / ED Course: Problem List Items Addressed This Visit   None Visit Diagnoses       Shortness of breath    -  Primary                Final diagnoses:   Shortness of breath    ED Discharge Orders     None          Lenor Hollering, MD 05/28/24 2324

## 2024-05-28 NOTE — ED Notes (Signed)
EDP at bedside talking to pt. 

## 2024-05-28 NOTE — Telephone Encounter (Signed)
 Called the patient and he stated that he was having multiple episodes of chest pressure like he did when he had his open heart surgery a year ago this past November. The current chest pressure occurs in the center of his chest and happens around every couple of weeks. He stated that it feels like it did when he had to have his open heart surgery this past November. He also stated that he had a carotid endarectomy recently and he has fallen multiple times as a result of low blood pressure. He also stated that his right arm has beenhot and feeling like something is stinging him. Based on the patients symptoms and his history I recommended that the patient go to the ER to be evaluated. Patient stated that he would go to the ER.

## 2024-05-28 NOTE — ED Triage Notes (Signed)
 Pt reports having some shortness of breath that has been dizzy and an increase in weakness when he bends over.   Had open heart surgery 9 months ago also has a pacemaker

## 2024-05-29 ENCOUNTER — Observation Stay (HOSPITAL_COMMUNITY)

## 2024-05-29 DIAGNOSIS — I10 Essential (primary) hypertension: Secondary | ICD-10-CM | POA: Diagnosis not present

## 2024-05-29 DIAGNOSIS — R0602 Shortness of breath: Secondary | ICD-10-CM | POA: Diagnosis present

## 2024-05-29 DIAGNOSIS — R0609 Other forms of dyspnea: Secondary | ICD-10-CM

## 2024-05-29 DIAGNOSIS — I951 Orthostatic hypotension: Secondary | ICD-10-CM | POA: Diagnosis not present

## 2024-05-29 DIAGNOSIS — I2510 Atherosclerotic heart disease of native coronary artery without angina pectoris: Secondary | ICD-10-CM

## 2024-05-29 DIAGNOSIS — Z743 Need for continuous supervision: Secondary | ICD-10-CM | POA: Diagnosis not present

## 2024-05-29 DIAGNOSIS — E785 Hyperlipidemia, unspecified: Secondary | ICD-10-CM | POA: Diagnosis not present

## 2024-05-29 DIAGNOSIS — I480 Paroxysmal atrial fibrillation: Secondary | ICD-10-CM

## 2024-05-29 LAB — RESPIRATORY PANEL BY PCR

## 2024-05-29 LAB — COMPREHENSIVE METABOLIC PANEL WITH GFR
ALT: 13 U/L (ref 0–44)
AST: 18 U/L (ref 15–41)
Albumin: 3.6 g/dL (ref 3.5–5.0)
Alkaline Phosphatase: 53 U/L (ref 38–126)
Anion gap: 11 (ref 5–15)
BUN: 19 mg/dL (ref 8–23)
CO2: 24 mmol/L (ref 22–32)
Calcium: 9.4 mg/dL (ref 8.9–10.3)
Chloride: 104 mmol/L (ref 98–111)
Creatinine, Ser: 0.96 mg/dL (ref 0.61–1.24)
GFR, Estimated: >60 mL/min (ref >60)
Glucose, Bld: 103 mg/dL — ABNORMAL HIGH (ref 70–99)
Potassium: 4.1 mmol/L (ref 3.5–5.1)
Sodium: 139 mmol/L (ref 135–145)
Total Bilirubin: 1.8 mg/dL — ABNORMAL HIGH (ref 0.0–1.2)
Total Protein: 6.2 g/dL — ABNORMAL LOW (ref 6.5–8.1)

## 2024-05-29 LAB — CBC WITH DIFFERENTIAL/PLATELET
Abs Immature Granulocytes: 0.03 K/uL (ref 0.00–0.07)
Basophils Absolute: 0.1 K/uL (ref 0.0–0.1)
Basophils Relative: 1 %
Eosinophils Absolute: 0.2 K/uL (ref 0.0–0.5)
Eosinophils Relative: 3 %
HCT: 35.8 % — ABNORMAL LOW (ref 39.0–52.0)
Hemoglobin: 10.2 g/dL — ABNORMAL LOW (ref 13.0–17.0)
Immature Granulocytes: 0 %
Lymphocytes Relative: 26 %
Lymphs Abs: 1.7 K/uL (ref 0.7–4.0)
MCH: 19.4 pg — ABNORMAL LOW (ref 26.0–34.0)
MCHC: 28.5 g/dL — ABNORMAL LOW (ref 30.0–36.0)
MCV: 67.9 fL — ABNORMAL LOW (ref 80.0–100.0)
Monocytes Absolute: 0.8 K/uL (ref 0.1–1.0)
Monocytes Relative: 12 %
Neutro Abs: 3.8 K/uL (ref 1.7–7.7)
Neutrophils Relative %: 58 %
Platelets: 186 K/uL (ref 150–400)
RBC: 5.27 MIL/uL (ref 4.22–5.81)
RDW: 17.2 % — ABNORMAL HIGH (ref 11.5–15.5)
WBC: 6.7 K/uL (ref 4.0–10.5)
nRBC: 0 % (ref 0.0–0.2)

## 2024-05-29 LAB — ECHOCARDIOGRAM COMPLETE BUBBLE STUDY
AR max vel: 1.74 cm2
AV Area VTI: 1.99 cm2
AV Area mean vel: 1.89 cm2
AV Mean grad: 16.5 mmHg
AV Peak grad: 31.2 mmHg
Ao pk vel: 2.8 m/s
Area-P 1/2: 3.85 cm2
Est EF: >75
MV VTI: 2.93 cm2
S' Lateral: 1.8 cm

## 2024-05-29 LAB — CORTISOL: Cortisol, Plasma: 3.8 ug/dL

## 2024-05-29 MED ORDER — ASPIRIN 81 MG PO TBEC
81.0000 mg | DELAYED_RELEASE_TABLET | Freq: Every day | ORAL | Status: DC
Start: 2024-05-29 — End: 2024-05-29
  Filled 2024-05-29: qty 1

## 2024-05-29 MED ORDER — FLUTICASONE PROPIONATE 50 MCG/ACT NA SUSP
2.0000 | Freq: Two times a day (BID) | NASAL | Status: DC
  Administered 2024-05-29 – 2024-05-30 (×2): 2 via NASAL
  Filled 2024-05-29: qty 16

## 2024-05-29 MED ORDER — LORATADINE 10 MG PO TABS
10.0000 mg | ORAL_TABLET | Freq: Every day | ORAL | Status: DC
  Administered 2024-05-29 – 2024-05-30 (×2): 10 mg via ORAL
  Filled 2024-05-29 (×2): qty 1

## 2024-05-29 MED ORDER — ROSUVASTATIN CALCIUM 20 MG PO TABS
20.0000 mg | ORAL_TABLET | Freq: Every day | ORAL | Status: DC
Start: 2024-05-29 — End: 2024-05-30
  Administered 2024-05-29 – 2024-05-30 (×2): 20 mg via ORAL
  Filled 2024-05-29 (×2): qty 1

## 2024-05-29 MED ORDER — ACETAMINOPHEN 650 MG RE SUPP: 650.0000 mg | Freq: Four times a day (QID) | RECTAL | Status: DC | PRN

## 2024-05-29 MED ORDER — ISOSORBIDE MONONITRATE ER 30 MG PO TB24
15.0000 mg | ORAL_TABLET | Freq: Every day | ORAL | Status: DC
  Administered 2024-05-29: 15 mg via ORAL
  Filled 2024-05-29: qty 1

## 2024-05-29 MED ORDER — IPRATROPIUM-ALBUTEROL 0.5-2.5 (3) MG/3ML IN SOLN: 3.0000 mL | Freq: Four times a day (QID) | RESPIRATORY_TRACT | Status: DC | PRN

## 2024-05-29 MED ORDER — ISOSORBIDE MONONITRATE ER 30 MG PO TB24
15.0000 mg | ORAL_TABLET | Freq: Once | ORAL | Status: AC
  Administered 2024-05-29: 15 mg via ORAL

## 2024-05-29 MED ORDER — APIXABAN 5 MG PO TABS
5.0000 mg | ORAL_TABLET | Freq: Two times a day (BID) | ORAL | Status: DC
Start: 2024-05-29 — End: 2024-05-30
  Administered 2024-05-29 – 2024-05-30 (×3): 5 mg via ORAL
  Filled 2024-05-29 (×3): qty 1

## 2024-05-29 MED ORDER — CLOPIDOGREL BISULFATE 75 MG PO TABS: 75.0000 mg | ORAL_TABLET | Freq: Every day | ORAL | Status: DC

## 2024-05-29 MED ORDER — LEVOTHYROXINE SODIUM 75 MCG PO TABS
75.0000 ug | ORAL_TABLET | Freq: Every day | ORAL | Status: DC
  Administered 2024-05-30: 75 ug via ORAL
  Filled 2024-05-29: qty 1

## 2024-05-29 MED ORDER — PANTOPRAZOLE SODIUM 20 MG PO TBEC
20.0000 mg | DELAYED_RELEASE_TABLET | Freq: Every day | ORAL | Status: DC
  Administered 2024-05-29 – 2024-05-30 (×2): 20 mg via ORAL
  Filled 2024-05-29 (×2): qty 1

## 2024-05-29 MED ORDER — MIDODRINE HCL 5 MG PO TABS: 5.0000 mg | ORAL_TABLET | Freq: Three times a day (TID) | ORAL | Status: DC

## 2024-05-29 MED ORDER — TAMSULOSIN HCL 0.4 MG PO CAPS: 0.4000 mg | ORAL_CAPSULE | Freq: Every day | ORAL | Status: DC

## 2024-05-29 MED ORDER — TAMSULOSIN HCL 0.4 MG PO CAPS
0.4000 mg | ORAL_CAPSULE | Freq: Every day | ORAL | Status: DC
  Administered 2024-05-29: 0.4 mg via ORAL
  Filled 2024-05-29: qty 1

## 2024-05-29 MED ORDER — ACETAMINOPHEN 325 MG PO TABS
650.0000 mg | ORAL_TABLET | Freq: Four times a day (QID) | ORAL | Status: DC | PRN
  Administered 2024-05-29 – 2024-05-30 (×2): 650 mg via ORAL
  Filled 2024-05-29 (×2): qty 2

## 2024-05-29 MED ORDER — METOPROLOL SUCCINATE ER 25 MG PO TB24
25.0000 mg | ORAL_TABLET | Freq: Every day | ORAL | Status: DC
  Administered 2024-05-29 – 2024-05-30 (×2): 25 mg via ORAL
  Filled 2024-05-29 (×2): qty 1

## 2024-05-29 MED ORDER — EMPAGLIFLOZIN 10 MG PO TABS
10.0000 mg | ORAL_TABLET | Freq: Every day | ORAL | Status: DC
  Administered 2024-05-29 – 2024-05-30 (×2): 10 mg via ORAL
  Filled 2024-05-29 (×2): qty 1

## 2024-05-29 MED ORDER — FLUTICASONE FUROATE-VILANTEROL 200-25 MCG/ACT IN AEPB
1.0000 | INHALATION_SPRAY | Freq: Every day | RESPIRATORY_TRACT | Status: DC
  Administered 2024-05-30: 1 via RESPIRATORY_TRACT
  Filled 2024-05-29: qty 28

## 2024-05-29 MED ORDER — ISOSORBIDE MONONITRATE ER 30 MG PO TB24
30.0000 mg | ORAL_TABLET | Freq: Every day | ORAL | Status: DC
  Administered 2024-05-30: 30 mg via ORAL
  Filled 2024-05-29: qty 1

## 2024-05-29 NOTE — Plan of Care (Signed)
   Problem: Clinical Measurements: Goal: Ability to maintain clinical measurements within normal limits will improve Outcome: Progressing

## 2024-05-29 NOTE — TOC CM/SW Note (Signed)
 Transition of Care [facility]) - Inpatient Brief Assessment   Patient Details  Name: Bruce Nunez MRN: [id number] Date of Birth: [birth year]  Transition of Care [facility]) CM/SW Contact:    Sudie Erminio Deems, RN Phone Number: 05/29/2024, 1:50 PM   Clinical Narrative: Patient presented for evaluation of dyspnea. PTA Patient was from home with spouse. Patient has utilized [employer] in the past. Patient has DME: cane, rollator, and stair lift in the home. No home needs identified during this visit. Inpatient Case Manager will continue to follow for additional disposition needs.    Transition of Care Asessment: Insurance and Status: Insurance coverage has been reviewed Patient has primary care physician: Yes Home environment has been reviewed: reviewed Prior level of function:: independent Prior/Current Home Services: No current home services Social Drivers of Health Review: SDOH reviewed no interventions necessary Readmission risk has been reviewed: Yes Transition of care needs: no transition of care needs at this time

## 2024-05-29 NOTE — Care Management Obs Status (Signed)
 MEDICARE OBSERVATION STATUS NOTIFICATION   Patient Details  Name: Bruce Nunez MRN: [id number] Date of Birth: Jun 20, 1941   Medicare Observation Status Notification Given:  Yes    [name] 05/29/2024, 8:47 AM

## 2024-05-29 NOTE — Progress Notes (Signed)
(  Carryover admission to the Day Admitter; accepted by Dr.  Franky as transfer from  [facility]  to a  cardiac tele bed at  [facility]  for dyspnea on exertion as well as orthostatic hypotension. Please see Dr.  Franky  transfer documentation in [facility] Communication for additional details).   I have placed some additional preliminary admit orders via the adult multi-morbid admission order set. I have also ordered fall precautions, and have continued existing orders for morning labs that include CBC, CMP, and random cortisol level.     Bruce Pore, DO Hospitalist

## 2024-05-29 NOTE — H&P (Signed)
 History and Physical    Patient: Bruce Nunez DOB: 18-May-1941 DOA: 05/28/2024 DOS: the patient was seen and examined on 05/29/2024 PCP: [name], PA  Patient coming from: Home  Chief Complaint:  Chief Complaint  Patient presents with   Fatigue   HPI: Bruce Nunez is a 83 y.o. 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  The patient is a poor historian and gives a rather tangential history. From what I can gather per Epic review, patient shared that on Tuesday morning he woke up, made his bed, and went to the restroom when he felt dizzy, weak, and a pressure to from his bellybutton to his chest with associated shortness of breath.  So noticed a brief couple seconds twitch to his chest.  He walked to the bed and laid down and took a sublingual nitroglycerin .  Patient shared that it improved his pressure however he was very tired and had to lay there for couple hours.  He later then was able to walk to his wife and called the heart care line about his symptoms awaiting a call return.  It appears the next day patient was called and advised to go to the ED.  In the ED at [facility], pt hypertensive. Labs notable troponins 32-->28, pro-BNP 160, and Tbili 1.8. EKG showed NSR w/ RBBB. CXR unremarkable. EDP requested admission after consulting cards who recommended IP eval, including TTE, given significant cardiac history.     Review of Systems: As mentioned in the history of present illness. All other systems reviewed and are  negative. Past Medical History:  Diagnosis Date   Anemia    recent iron supplement   Angina pectoris 05/02/2023   Aortic regurgitation 03/12/2017   Aortic stenosis 04/29/2019   Asthma    childhood-   2013 bronchitis w/wheezing   Carotid artery occlusion    Complication of anesthesia     ether made me sick - woke up during colonoscopy   Diabetes mellitus without complication (HCC)    Endotracheally intubated 07/05/2023   Essential hypertension 03/12/2017   Frequency of urination    GERD (gastroesophageal reflux disease)    occasional   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   Heart murmur    Hemorrhoids    History of kidney cancer    History of kidney stones    Hx of colon cancer, stage I    Hx of nonmelanoma skin cancer    Hx of peptic ulcer    as teenager   Hx of transfusion    Hyperlipidemia    Hypertension    Inguinal hernia    Orthostatic hypotension 01/06/2024   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   PAF (paroxysmal atrial fibrillation) (HCC) 08/20/2023   PONV (postoperative nausea and vomiting)    only when had Ether as Anesthesia drug   Prostate cancer (HCC) 08/15/2012   also Hx colon cancer / Hx skin cancer   Right inguinal hernia 05/08/2016   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   S/P right inguinal hernia repair Sept 2015 05/17/2014   Recurrent right indirect  Hernia treated with robotic TAPP repair  with enterolysis Sept 2017   Stroke [facility]) 03/28/2022   Syncope 08/17/2023   Past Surgical History:  Procedure Laterality Date   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   Procedure: AORTIC VALVE REPLACEMENT WITH INSPIRIS AORTIC VALVE;  Surgeon: Maryjane Mt, MD;  Location: [facility] OR;  Service: Open Heart Surgery;  Laterality: N/A;   cataract surgery  2009   CORONARY ARTERY BYPASS GRAFT N/A 07/05/2023   Procedure: CORONARY ARTERY BYPASS GRAFTING, USING LEFT INTERNAL MAMMARY ARTERY;  Surgeon: Maryjane Mt, MD;  Location: [facility] OR;   Service: Open Heart Surgery;  Laterality: N/A;   CYSTOSCOPY     ENDARTERECTOMY Left 04/10/2024   Procedure: LEFT CAROTID ENDARTERECTOMY;  Surgeon: Lanis Fonda BRAVO, MD;  Location: [facility] OR;  Service: Vascular;  Laterality: Left;   INGUINAL HERNIA REPAIR Right 05/17/2014   Procedure: RIGHT INGUINAL HERNIA REPAIR ;  Surgeon: Adina Lunger, MD;  Location: [facility] ORS;  Service: General;  Laterality: Right;  With MESH   KNEE SURGERY Right 2012   LEFT HEART CATH AND CORONARY ANGIOGRAPHY N/A 05/03/2023   Procedure: LEFT HEART CATH AND CORONARY ANGIOGRAPHY;  Surgeon: Wonda Sharper, MD;  Location: [facility] INVASIVE CV LAB;  Service: Cardiovascular;  Laterality: N/A;   PACEMAKER IMPLANT N/A 07/17/2023   Procedure: PACEMAKER IMPLANT;  Surgeon: Inocencio Soyla Lunger, MD;  Location: [facility] INVASIVE CV LAB;  Service: Cardiovascular;  Laterality: N/A;   PARTIAL COLECTOMY  2005   PATCH ANGIOPLASTY Left 04/10/2024   Procedure: PATCH ANGIOPLASTY, USING GEORGE BIOLOGIC PATCH;  Surgeon: Lanis Fonda BRAVO, MD;  Location: [facility] OR;  Service: Vascular;  Laterality: Left;   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   RADIOACTIVE SEED IMPLANT N/A 11/24/2012   Procedure: RADIOACTIVE SEED IMPLANT;  Surgeon: Alm GORMAN Fragmin, MD;  Location: [facility];  Service: Urology;  Laterality: N/A;   71seeds implanted  no seeds found in bladder   TEE WITHOUT CARDIOVERSION N/A 07/05/2023   Procedure: TRANSESOPHAGEAL ECHOCARDIOGRAM;  Surgeon: Maryjane Mt, MD;  Location: [facility] OR;  Service: Open Heart Surgery;  Laterality: N/A;   THROAT SURGERY  1940's   as child (age 53)growth that created fissure/corrective surgery   TONSILLECTOMY     age 5   XI ROBOTIC ASSISTED INGUINAL HERNIA REPAIR WITH MESH Right 05/08/2016   Procedure: XI ROBOTIC ASSISTED REPAIR OF RECURRENT RIGHT INGUINAL HERNIA;  Surgeon: Donnice Lunger, MD;  Location: [facility] ORS;  Service: General;  Laterality: Right;  With MESH   Social History:  reports that he has never smoked. He  has never been exposed to tobacco smoke. He has never used smokeless tobacco. He reports that he does not drink alcohol and does not use drugs.  Allergies  Allergen Reactions   Penicillins Anaphylaxis, Shortness Of Breath, Itching and Swelling    Throat swells up but no intubation per wife   Iodinated Contrast Media Hives and Rash   Asa [Aspirin ] Other (See Comments)    Hx of GI bleeds   Metformin And Related Diarrhea   Monodox [Doxycycline Hyclate] Nausea Only   Sulfa Antibiotics Itching and Swelling   Ultram [Tramadol] Nausea And Vomiting    Family History  Problem Relation Age of Onset   Heart attack Other    Brain cancer Other    Coronary artery disease Other    Prostate cancer Other     Prior to Admission medications   Medication Sig Start Date End Date Taking? Authorizing Provider  acetaminophen  (TYLENOL ) 325 MG tablet Take 1-2 tablets (325-650 mg total)  by mouth every 6 (six) hours as needed for mild pain (pain score 1-3). 07/19/23   Raguel Con RAMAN, PA-C  albuterol  (PROVENTIL  HFA;VENTOLIN  HFA) 108 (90 BASE) MCG/ACT inhaler Inhale 2 puffs into the lungs every 6 (six) hours as needed for shortness of breath.    [provider]  apixaban  (ELIQUIS ) 5 MG TABS tablet Take 1 tablet (5 mg total) by mouth 2 (two) times daily. 04/16/24   Schuh, McKenzi P, PA-C  budesonide -formoterol  (SYMBICORT) 160-4.5 MCG/ACT inhaler Inhale 2 puffs into the lungs 2 (two) times daily. 03/08/23   [provider]  cyanocobalamin  (,VITAMIN B-12,) 1000 MCG/ML injection Inject 1,000 mcg into the muscle every 30 (thirty) days. 02/04/19   [provider]  empagliflozin  (JARDIANCE ) 10 MG TABS tablet Take 1 tablet (10 mg total) by mouth daily. 08/21/23   Parcells, Waddell LABOR, PA-C  fexofenadine (ALLEGRA) 180 MG tablet Take 180 mg by mouth daily.    [provider]  fluticasone  (FLONASE ) 50 MCG/ACT nasal spray Place 2 sprays into both nostrils 2 (two) times daily.    [provider]  levothyroxine  (SYNTHROID ) 75 MCG tablet Take 75 mcg by mouth daily before breakfast.    [provider]  metoprolol  succinate (TOPROL -XL) 25 MG 24 hr tablet Take 1 tablet (25 mg total) by mouth daily. Take with or immediately following a meal. 12/05/23   Leverne Charlies Helling, PA-C  midodrine  (PROAMATINE ) 5 MG tablet Take 1 tablet (5 mg total) by mouth 3 (three) times daily with meals. Take the third dose of this medication at 3 pm 11/19/23   [name] [name], MD  oxyCODONE  (OXY IR/ROXICODONE ) 5 MG immediate release tablet Take 1-2 tablets (5-10 mg total) by mouth every 4 (four) hours as needed for moderate pain (pain score 4-6). 04/11/24   Schuh, McKenzi P, PA-C  pantoprazole  (PROTONIX ) 20 MG tablet Take 20 mg by mouth daily. 04/06/24   [provider]  rosuvastatin  (CRESTOR ) 20 MG tablet Take 1 tablet (20 mg total) by mouth daily. 01/07/24   Madireddy, Alean SAUNDERS, MD  solifenacin (VESICARE) 10 MG tablet Take 10 mg by mouth daily. 01/17/21   [provider]  tamsulosin  (FLOMAX ) 0.4 MG CAPS capsule Take 1 capsule (0.4 mg total) by mouth daily. 09/09/23   [name] [name], MD    Physical Exam: Vitals:   05/29/24 0430 05/29/24 0441 05/29/24 0552 05/29/24 0559  BP: (!) 164/91 (!) 164/91  (!) 169/88  Pulse:  72  85  Resp: 18 19    Temp:  97.8 F (36.6 C)  97.7 F (36.5 C)  TempSrc:  Oral  Oral  SpO2:  100%  100%  Weight:   63.3 kg   Height:   5' 7 (1.702 m)    General: Alert, oriented x3, resting comfortably in no acute distress  Respiratory: Lungs clear to auscultation bilaterally with normal respiratory effort; no w/r/r Cardiovascular: Regular rate and rhythm w/o m/r/g Abdomen: Soft, nontender, nondistended. Positive bowel sounds    Data Reviewed:  Lab Results  Component Value Date   WBC 6.7 05/29/2024   HGB 10.2 (L) 05/29/2024   HCT 35.8 (L) 05/29/2024   MCV 67.9 (L) 05/29/2024   PLT 186 05/29/2024   Lab Results  Component Value Date   GLUCOSE  103 (H) 05/29/2024   CALCIUM  9.4 05/29/2024   NA 139 05/29/2024   K 4.1 05/29/2024   CO2 24 05/29/2024   CL 104 05/29/2024   BUN 19 05/29/2024   CREATININE 0.96  05/29/2024   Lab Results  Component Value Date   ALT 13 05/29/2024   AST 18 05/29/2024   ALKPHOS 53 05/29/2024   BILITOT 1.8 (H) 05/29/2024   Lab Results  Component Value Date   INR 1.0 04/08/2024   INR 1.8 (H) 07/05/2023   INR 1.0 07/03/2023   Radiology: DG Chest 2 View Result Date: 05/28/2024 CLINICAL DATA:  shortness of breath EXAM: CHEST - 2 VIEW COMPARISON:  Chest x-ray 11/07/2023, CT chest 08/16/2023 FINDINGS: Left chest wall 2 lead pacemaker. The heart and mediastinal contours are unchanged. Atherosclerotic plaque. No focal consolidation. No pulmonary edema. No pleural effusion. No pneumothorax. No acute osseous abnormality.  Sternotomy wires are intact. IMPRESSION: No active cardiopulmonary disease. Electronically Signed   By: Morgane  Naveau M.D.   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  DOE H/o asthma H/o CAD s/p CABG H/o severe Aortic insufficiency s/p AV replacement  H/o complete heart block s/p PPM -Cards consulted; apprec eval/recs -F/u TTE -F/u RVP to exclude URI  Afib -PTA Toprol  XL 25mg  daily and Eliquis  5mg  BID  HFpEF -PTA ASA, Crestor , and Toprol  XL  H/o chronic orthostatic hypotension -HOLD pta midodrine  for now  DM2 -PTA Jardiance   BPH -PTA Flomax    Advance Care Planning:   Code Status: Full Code    Consults: Cards  Family Communication: Wife  Severity of Illness: The appropriate patient status for this patient is INPATIENT. Inpatient status is judged to be reasonable and necessary in order to provide the required intensity of service to ensure the patient's safety. The patient's presenting symptoms, physical exam findings, and initial radiographic and laboratory data in the context of their chronic comorbidities is felt to place them at high risk for further clinical deterioration. Furthermore, it is not anticipated that the patient will be medically stable for discharge from the hospital within 2 midnights of admission.   * I certify that at the point of admission it is my clinical judgment that the patient will require inpatient hospital care spanning beyond 2 midnights from the point of admission due to high intensity of service, high risk for further deterioration and high frequency of surveillance required.*   ------- I spent 65 minutes reviewing previous notes, at the bedside counseling/discussing the treatment plan, and performing clinical documentation.  Author: Marsha Ada, MD 05/29/2024 8:38 AM  For on call review [email or web address].

## 2024-05-29 NOTE — Plan of Care (Signed)

## 2024-05-29 NOTE — Plan of Care (Signed)
  Problem: Clinical Measurements: Goal: Ability to maintain clinical measurements within normal limits will improve 05/29/2024 2006 by Corlis Erminio CROME, RN Outcome: Progressing 05/29/2024 2003 by Corlis Erminio CROME, RN Outcome: Progressing

## 2024-05-29 NOTE — Consult Note (Addendum)
 Cardiology Consultation   Patient ID: DAREON NUNZIATO MRN: [id number]; DOB: 1940/12/06  Admit date: 05/28/2024 Date of Consult: 05/29/2024  PCP:  [name], PA   [facility] Providers Cardiologist:  Redell Leiter, MD        Patient Profile: Bruce Nunez is a 83 y.o. male with a hx of CAD and severe AR s/p 1v CABG (LIMA-LAD)+21 mm Inspiris AVR (07/05/23, [place]der) c/b CVA and post op paroxysmal AF and subsequent post op symptomatic CHB requiring LEFT Abbott DC PPM implant (07/17/23, [place]), HFpEF, T2DM, BPH, orthostatic hypotension, hyperlipidemia, GERD and hypothyroidism who is being seen 05/29/2024 for the evaluation of dyspnea at the request of Marsha Ada MD.  History of Present Illness: Mr. Rishel admitted 06/2023 for fatigue and DOE. Patient ultimately underwent CABG and AVR. This admission was complicated by delirium and agitation, MRI head showed multiple small acute infarcts. Patient then developed post-op AF and was started on diltiazem  and eliquis . He then developed a 1st degree AV block that later progressed to complete heart block. EP was consulted, and patient underwent PPM placement. He did follow-up outpatient, however he was then admitted shortly thereafter 07/2023 for recurrent falls with associated prodromal symptoms of lightheadedness and weakness. Suspected to be related to orthostatic hypotension vs vasovagal. It was noted patient was having inappropriate sinus tachycardia this admission thought to be due to deconditioning. Patient was discharged and at an outpatient visit was subsequently placed on midodrine .   Seen outpatient by Dr. Liborio for pre-op evaluation for left carotid endarterectomy. Patient was doing well at that time and proceeded with his surgery 03/2024.   Patient presented to [facility] on 10/2 for dizziness, weakness, and shortness of breath.  At [facility]:  BP: 167/77    HR 71   SpO2 100% ECG: sinus rhythm with RBBB VR 68 CXR unremarkable lab  work:  Troponin 32-> 28    Pro BNP 160 T bili 1.8      Hgb 10.2, stable   On interview, patient shared that on Tuesday morning he woke up, made his bed, and went to the restroom when he felt dizzy, weak, and a pressure to from his bellybutton to his chest with associated shortness of breath.  So noticed a brief couple seconds twitch to his chest.  He walked to the bed and laid down and took a sublingual nitroglycerin .  Patient shared that it improved his pressure however he was very tired and had to lay there for couple hours.  He later then was able to walk to his wife and called the heart care line about his symptoms awaiting a call return.  It appears the next day patient was called and advised to go to the ED.   He shared that he has been having ongoing fatigue and shortness of breath since before the valve replacement.  But this fatigue and shortness of breath was worse than normal.  Patient did share he has had 1 other episode of the chest pressure that he took a sublingual nitroglycerin  with that did improve his well.  He noted he has been having this pressure sensation for about 3 years though has noticed it more in this last month finds that during yard work he does have to stop it because of the pressure that was relieved with rest.  Patient did share that he checks his blood pressure regularly at home and is typically 160 170/65-70.  He did note that his blood pressure on the day in  question Tuesday, it was 105/45. Reported positional dizziness.    Patient denied symptoms today he was able to walk to the restroom from his bed without difficulty.  Denied recent illness, including symptoms of headache fever chills cough.  He does have chronic allergies with congestion.  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   Heart murmur    Hemorrhoids    History of kidney cancer    History of kidney stones    Hx of colon cancer, stage I    Hx of nonmelanoma skin cancer    Hx of peptic ulcer    as teenager   Hx of transfusion    Hyperlipidemia    Hypertension    Inguinal hernia    Orthostatic hypotension 01/06/2024   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   PAF (paroxysmal atrial fibrillation) (HCC) 08/20/2023   PONV (postoperative nausea and vomiting)    only when had Ether as Anesthesia drug   Prostate cancer (HCC) 08/15/2012   also Hx colon cancer / Hx skin cancer   Right inguinal hernia 05/08/2016   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   S/P right inguinal hernia repair Sept 2015 05/17/2014   Recurrent right indirect  Hernia treated with robotic TAPP repair with enterolysis Sept 2017   Stroke [facility]) 03/28/2022   Syncope 08/17/2023    Past Surgical History:  Procedure Laterality Date   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   Procedure: AORTIC VALVE REPLACEMENT WITH INSPIRIS AORTIC VALVE;  Surgeon: Maryjane Mt, MD;  Location: [facility] OR;  Service: Open Heart Surgery;  Laterality: N/A;   cataract surgery  2009   CORONARY ARTERY BYPASS GRAFT N/A 07/05/2023   Procedure: CORONARY ARTERY BYPASS GRAFTING, USING LEFT INTERNAL MAMMARY ARTERY;  Surgeon: Maryjane Mt, MD;  Location: [facility] OR;  Service: Open Heart Surgery;  Laterality: N/A;   CYSTOSCOPY     ENDARTERECTOMY Left 04/10/2024   Procedure: LEFT CAROTID ENDARTERECTOMY;  Surgeon: Lanis Fonda BRAVO, MD;  Location: [facility] OR;  Service: Vascular;  Laterality: Left;   INGUINAL  HERNIA REPAIR Right 05/17/2014   Procedure: RIGHT INGUINAL HERNIA REPAIR ;  Surgeon: Adina Lunger, MD;  Location: [facility] ORS;  Service: General;  Laterality: Right;  With MESH   KNEE SURGERY Right 2012   LEFT HEART CATH AND CORONARY ANGIOGRAPHY N/A 05/03/2023   Procedure: LEFT HEART CATH AND CORONARY ANGIOGRAPHY;  Surgeon: Wonda Sharper, MD;  Location: [facility] INVASIVE CV LAB;  Service: Cardiovascular;  Laterality: N/A;   PACEMAKER IMPLANT N/A 07/17/2023   Procedure: PACEMAKER IMPLANT;  Surgeon: Inocencio Soyla Lunger, MD;  Location: [facility] INVASIVE CV LAB;  Service: Cardiovascular;  Laterality: N/A;   PARTIAL COLECTOMY  2005   PATCH ANGIOPLASTY Left 04/10/2024   Procedure: PATCH ANGIOPLASTY, USING GEORGE BIOLOGIC PATCH;  Surgeon: Lanis Fonda BRAVO, MD;  Location: [facility] OR;  Service: Vascular;  Laterality: Left;   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   RADIOACTIVE SEED IMPLANT N/A 11/24/2012   Procedure: RADIOACTIVE SEED IMPLANT;  Surgeon: Alm GORMAN Fragmin, MD;  Location: [facility];  Service: Urology;  Laterality: N/A;   71seeds implanted  no seeds found in bladder   TEE WITHOUT CARDIOVERSION N/A 07/05/2023   Procedure: TRANSESOPHAGEAL ECHOCARDIOGRAM;  Surgeon: Maryjane Mt, MD;  Location: [facility] OR;  Service: Open Heart Surgery;  Laterality: N/A;   THROAT SURGERY  1940's   as child (age 40)growth that created fissure/corrective surgery   TONSILLECTOMY     age 7   XI ROBOTIC ASSISTED INGUINAL HERNIA REPAIR WITH MESH Right 05/08/2016   Procedure: XI ROBOTIC ASSISTED REPAIR OF RECURRENT RIGHT INGUINAL HERNIA;  Surgeon: Donnice Lunger, MD;  Location: [facility] ORS;  Service: General;  Laterality: Right;  With MESH       Scheduled Meds:  apixaban   5 mg Oral BID   aspirin  EC  81 mg Oral Daily   empagliflozin   10 mg Oral Daily   fluticasone   2 spray Each Nare BID   fluticasone  furoate-vilanterol  1 puff Inhalation Daily   [START ON 05/30/2024] levothyroxine   75 mcg Oral QAC breakfast   loratadine  10  mg Oral Daily   metoprolol  succinate  25 mg Oral Daily   pantoprazole   20 mg Oral Daily   rosuvastatin   20 mg Oral Daily   tamsulosin   0.4 mg Oral Daily   Continuous Infusions:  PRN Meds: acetaminophen  **OR** acetaminophen , ipratropium-albuterol   Allergies:    Allergies  Allergen Reactions   Penicillins Anaphylaxis, Shortness Of Breath, Itching and Swelling    Throat swells up but no intubation per wife   Iodinated Contrast Media Hives and Rash   Dorethia Persia ] Other (See Comments)    Hx of GI bleeds   Metformin And Related Diarrhea   Monodox [Doxycycline Hyclate] Nausea Only   Sulfa Antibiotics Itching and Swelling   Ultram [Tramadol] Nausea And Vomiting    Social History:   Social History   Socioeconomic History   Marital status: Married    Spouse name: Not on file   Number of children: 2   Years of education: Not on file   Highest education level: Not on file  Occupational History   Occupation: Retired    Comment: Designer, fashion/clothing  Tobacco Use   Smoking status: Never    Passive exposure: Never   Smokeless tobacco: Never  Vaping Use   Vaping status: Never Used  Substance and Sexual Activity   Alcohol use: No   Drug use: No   Sexual activity: Not on file  Other Topics Concern   Not on file  Social History Narrative   Not on file   Social Drivers of Health   Financial Resource Strain: Not on file  Food Insecurity: No Food Insecurity (08/17/2023)   Hunger Vital Sign    Worried About Running Out of Food in the Last Year: Never true    Ran Out of Food in the Last Year: Never true  Transportation Needs: No Transportation Needs (08/17/2023)   PRAPARE - Administrator, Civil Service (Medical): No    Lack of Transportation (Non-Medical): No  Physical Activity: Not on file  Stress: Not on file  Social Connections: Not on file  Intimate Partner Violence: Not At Risk (08/17/2023)   Humiliation, Afraid, Rape, and Kick questionnaire    Fear of Current or  Ex-Partner: No    Emotionally Abused: No    Physically Abused: No  Sexually Abused: No    Family History:   Family History  Problem Relation Age of Onset   Heart attack Other    Brain cancer Other    Coronary artery disease Other    Prostate cancer Other      ROS:  Please see the history of present illness.  All other ROS reviewed and negative.     Physical Exam/Data: Vitals:   05/29/24 0441 05/29/24 0552 05/29/24 0559 05/29/24 0842  BP: (!) 164/91  (!) 169/88 (!) 153/69  Pulse: 72  85 71  Resp: 19   18  Temp: 97.8 F (36.6 C)  97.7 F (36.5 C) 98 F (36.7 C)  TempSrc: Oral  Oral Oral  SpO2: 100%  100% 99%  Weight:  63.3 kg    Height:  5' 7 (1.702 m)     No intake or output data in the 24 hours ending 05/29/24 0937    05/29/2024    5:52 AM 05/28/2024    5:24 PM 05/21/2024    9:11 AM  Last 3 Weights  Weight (lbs) 139 lb 8 oz 141 lb 142 lb 1.6 oz  Weight (kg) 63.277 kg 63.957 kg 64.456 kg     Body mass index is 21.85 kg/m.  General:  Well nourished, well developed, in no acute distress HEENT: normal Vascular: No carotid bruits; Distal pulses 2+ bilaterally Cardiac:  RRR; No mumur though notable click with radiation to carotids Lungs:  clear to auscultation bilaterally, no wheezing, rhonchi or rales  Abd: soft, nontender, no hepatomegaly  Ext: no edema Musculoskeletal:  No deformities, BUE and BLE strength normal and equal Skin: warm and dry  Neuro:  CNs 2-12 intact, no focal abnormalities noted Psych:  Normal affect   EKG:  The EKG was personally reviewed and demonstrates:  see hpi Telemetry:  Telemetry was personally reviewed and demonstrates:  sinus rhythm with RBBB, avg HR 70-75  Relevant CV Studies: Echocardiogram 11/14/23 IMPRESSIONS     1. Left ventricular ejection fraction, by estimation, is 70 to 75%. The  left ventricle has hyperdynamic function. The left ventricle has no  regional wall motion abnormalities. There is mild left ventricular   hypertrophy.   2. Right ventricular systolic function is normal. The right ventricular  size is normal. Tricuspid regurgitation signal is inadequate for assessing  PA pressure.   3. A small pericardial effusion is present.   4. The mitral valve is normal in structure. Trivial mitral valve  regurgitation.   5. The aortic valve has been repaired/replaced. Aortic valve  regurgitation is not visualized. There is a 21 mm Inspiris valve present  in the aortic position. Vmax 2.6 m/s, MG , EOA 1.1 cm^2, DI 0.5   6. The inferior vena cava is normal in size with greater than 50%  respiratory variability, suggesting right atrial pressure of 3 mmHg.   Laboratory Data: Chemistry Recent Labs  Lab 05/28/24 1804 05/29/24 0646  NA 139 139  K 4.7 4.1  CL 105 104  CO2 24 24  GLUCOSE 115* 103*  BUN 23 19  CREATININE 0.98 0.96  CALCIUM  9.6 9.4  GFRNONAA >60 >60  ANIONGAP 11 11    Recent Labs  Lab 05/29/24 0646  PROT 6.2*  ALBUMIN  3.6  AST 18  ALT 13  ALKPHOS 53  BILITOT 1.8*   Hematology Recent Labs  Lab 05/28/24 1804 05/29/24 0646  WBC 7.1 6.7  RBC 5.24 5.27  HGB 10.2* 10.2*  HCT 35.9* 35.8*  MCV 68.5* 67.9*  MCH 19.5* 19.4*  MCHC 28.4* 28.5*  RDW 17.3* 17.2*  PLT 211 186   BNP Recent Labs  Lab 05/28/24 1804  PROBNP 160.0     Radiology/Studies:  DG Chest 2 View Result Date: 05/28/2024 CLINICAL DATA:  shortness of breath EXAM: CHEST - 2 VIEW COMPARISON:  Chest x-ray 11/07/2023, CT chest 08/16/2023 FINDINGS: Left chest wall 2 lead pacemaker. The heart and mediastinal contours are unchanged. Atherosclerotic plaque. No focal consolidation. No pulmonary edema. No pleural effusion. No pneumothorax. No acute osseous abnormality.  Sternotomy wires are intact. IMPRESSION: No active cardiopulmonary disease. Electronically Signed   By: Morgane  Naveau M.D.   On: 05/28/2024 18:00     Assessment and Plan: CAD s/p CABG [LIMA to LAD] Elevated Troponin Patient shared he has  been having a pressure like sensation from his navel to chest worsened by exertion and relieved with rest and SL nitroglycerin . This occurred Tues am with associated symptoms of fatigue, weakness, and shortness of breath. ECG without acute ischemic changes  Troponin minimally elevated Echo pending  Patient with recent CABG with anginal symptoms. I do not suspect ACS, sounds more like stable angina and his SL nitroglycerin  caused symptomatic hypotension. Will follow up on echocardiogram, and will hold off on ischemic evaluation at this time based on the non-obstructive CAD seen on recent cath Nov 2024.    Discontinue ASA as patient has history of GI bleed  Discontinue plavix  as this was only for short term use while patient was off anticoagulation for his L CEA Continue metoprolol  succinate 25 mg  Start imdur  30 mg  Patient on jardiance  for T2DM, continue for added CVD benefit  Hyperlipidemia LDL 32   HDL 40 from 03/2024 Continue crestor  20 mg  Orthostatic hypotension  BP: 153/69 PTA midodrine  on hold, agree while admitted Patient shared his BP has been elevated at home. Will defer to outpatient team for further management as it is on hold here and patient had been dealing with recurrent falls.   AS s/p AVR  Will assess valve on pending echocardiogram.   pAF CHB s/p PPM Interrogation was completed. No arrhthymias or AF noted on device.  [place] Vasc score 7 Continue eliquis  5 mg BID Continue metoprolol  succinate 25 mg  BPH Moving flomax  to be taken at bedtime to help reduce orthostatic hypotension episodes during the daytime  Per primary Hx of CVA T2DM GERD Hypothyroidism Carotid artery stenosis s/p L CEA  Risk Assessment/Risk Scores:         CHA2DS2-VASc Score = 7   This indicates a 11.2% annual risk of stroke. The patient's score is based upon: CHF History: 0 HTN History: 1 Diabetes History: 1 Stroke History: 2 Vascular Disease History: 1 Age Score: 2 Gender  Score: 0        For questions or updates, please contact [facility] Please consult [email or web address] for contact info under      Signed, Leontine LOISE Salen, PA-C  05/29/2024 9:37 AM

## 2024-05-29 NOTE — Progress Notes (Signed)
 Mobility Specialist Progress Note;   05/29/24 1122  Mobility  Activity Ambulated with assistance;Pivoted/transferred from bed to chair  Level of Assistance Contact guard assist, steadying assist  Assistive Device None  Distance Ambulated (ft) 400 ft  Activity Response Tolerated well  Mobility Referral Yes  Mobility visit 1 Mobility  Mobility Specialist Start Time (ACUTE ONLY) 1122  Mobility Specialist Stop Time (ACUTE ONLY) 1137  Mobility Specialist Time Calculation (min) (ACUTE ONLY) 15 min   Pt eager for mobility. Required light MinG assistance during ambulation for safety. Able to ambulate on RA, VSS throughout. Requested to sit in chair at [facility]. Pt left in chair with all needs met, alarm on.   Lauraine Erm Mobility Specialist Please contact via SecureChat or [employer] [phone]

## 2024-05-29 NOTE — Progress Notes (Signed)
 Mobility Specialist Progress Note;   05/29/24 1400  Orthostatic Lying   BP- Lying 149/64  Orthostatic Sitting  BP- Sitting 128/75  Orthostatic Standing at 0 minutes  BP- Standing at 0 minutes 109/56  Orthostatic Standing at 3 minutes  BP- Standing at 3 minutes 124/65  Mobility  Activity Stood at bedside (Checked orthostatics)  Level of Assistance Contact guard assist, steadying assist  Assistive Device None  Activity Response Tolerated fair  Mobility Referral Yes  Mobility visit 1 Mobility  Mobility Specialist Start Time (ACUTE ONLY) 1400  Mobility Specialist Stop Time (ACUTE ONLY) 1409  Mobility Specialist Time Calculation (min) (ACUTE ONLY) 9 min   RN requesting orthostatics, see above. Required steady assist while taking orthostatics. See orthostatic vitals above, no c/o dizziness throughout positional changes. Pt left with all needs met. Family present. RN notified.   Lauraine Erm Mobility Specialist Please contact via SecureChat or [employer] [phone]

## 2024-05-30 ENCOUNTER — Other Ambulatory Visit (HOSPITAL_COMMUNITY): Payer: Self-pay

## 2024-05-30 DIAGNOSIS — R0602 Shortness of breath: Secondary | ICD-10-CM | POA: Diagnosis not present

## 2024-05-30 DIAGNOSIS — I2510 Atherosclerotic heart disease of native coronary artery without angina pectoris: Secondary | ICD-10-CM | POA: Diagnosis not present

## 2024-05-30 DIAGNOSIS — I951 Orthostatic hypotension: Secondary | ICD-10-CM | POA: Diagnosis not present

## 2024-05-30 DIAGNOSIS — R0609 Other forms of dyspnea: Secondary | ICD-10-CM | POA: Diagnosis not present

## 2024-05-30 MED ORDER — TAMSULOSIN HCL 0.4 MG PO CAPS: 0.4000 mg | ORAL_CAPSULE | Freq: Every day | ORAL | Status: AC

## 2024-05-30 MED ORDER — ISOSORBIDE MONONITRATE ER 30 MG PO TB24
30.0000 mg | ORAL_TABLET | Freq: Every day | ORAL | 0 refills | Status: DC
  Filled 2024-05-30: qty 30, 30d supply, fill #0

## 2024-05-30 NOTE — Progress Notes (Signed)
 DISCHARGE   Pt and wife expressed verbal understanding of discharge POC.  Additional education reviewed and placed in AVS.  PIV and tele removed CCMD/Holly.  Ride at bedside.  TOC meds in process.   Will transfer to Main A.

## 2024-05-30 NOTE — Hospital Course (Signed)
 83 y.o. 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

## 2024-05-30 NOTE — Care Management (Signed)
  Transition of Care [facility]) Screening Note   Patient Details  Name: BRANT PEETS Date of Birth: [birth year]   Transition of Care [facility]) CM/SW Contact:    Corean JAYSON Canary, RN Phone Number: 05/30/2024, 11:19 AM    Transition of Care Department [facility]) has reviewed patient and no TOC needs have been identified at this time. We will continue to monitor patient advancement through interdisciplinary progression rounds. If new patient transition needs arise, please place a TOC consult.

## 2024-05-30 NOTE — Discharge Summary (Signed)
 Physician Discharge Summary   Patient: Bruce Nunez MRN: [id number] DOB: 1941-02-09  Admit date:     05/28/2024  Discharge date: 05/30/24  Discharge Physician: Garnette Pelt   PCP: [name], PA   Recommendations at discharge:    Recommend checking CBC and iron levels in 1-2 weeks F/u with PCP in 1-2 weeks  Discharge Diagnoses: Principal Problem:   Exertional dyspnea  Resolved Problems:   * No resolved hospital problems. *  Hospital Course: 83 y.o. 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   Assessment and Plan: DOE H/o asthma H/o CAD s/p CABG H/o severe Aortic insufficiency s/p AV replacement  H/o complete heart block s/p PPM -Cards consulted -TTE was noted to be unremarkable with no further cardiovascular testing indicated -respiratory viral panel was negative   Afib -PTA Toprol  XL 25mg  daily and Eliquis  5mg  BID   HFpEF -PTA ASA, Crestor , and Toprol  XL   H/o chronic orthostatic hypotension -stopped pta midodrine  given elevated BP   DM2 -PTA Jardiance    BPH -Per Cardiology, recommendation to take flomax  at bedtime, and not in the morning, as pt had been taking      Consultants: Cardiology Procedures performed:   Disposition: Home Diet recommendation:  Cardiac diet DISCHARGE MEDICATION: Allergies as of 05/30/2024       Reactions   Penicillins Anaphylaxis, Shortness Of Breath, Itching, Swelling   Throat swells up but no intubation per wife   Iodinated Contrast Media Hives, Rash   Asa  [aspirin ] Other (See Comments)   Hx of GI bleeds   Metformin And Related Diarrhea   Monodox [doxycycline Hyclate] Nausea Only   Sulfa Antibiotics Itching, Swelling   Ultram [tramadol] Nausea And Vomiting        Medication List     STOP taking these medications    midodrine  5 MG tablet Commonly known as: PROAMATINE    oxyCODONE  5 MG immediate release tablet Commonly known as: Oxy IR/ROXICODONE        TAKE these medications    acetaminophen  325 MG tablet Commonly known as: TYLENOL  Take 1-2 tablets (325-650 mg total) by mouth every 6 (six) hours as needed for mild pain (pain score 1-3).   albuterol  108 (90 Base) MCG/ACT inhaler Commonly known as: VENTOLIN  HFA Inhale 2 puffs into the lungs every 6 (six) hours as needed for shortness of breath.   apixaban  5 MG Tabs tablet Commonly known as: ELIQUIS  Take 1 tablet (5 mg total) by mouth 2 (two) times daily.   budesonide -formoterol  160-4.5 MCG/ACT inhaler Commonly known as: SYMBICORT Inhale 2 puffs into the lungs 2 (two) times daily.   cyanocobalamin  1000 MCG/ML injection Commonly known as: VITAMIN B12 Inject 1,000 mcg into the muscle every 30 (thirty) days.   empagliflozin  10 MG Tabs tablet Commonly known as: JARDIANCE  Take 1 tablet (10 mg total) by mouth daily.   fexofenadine 180 MG tablet Commonly known as: ALLEGRA Take 180 mg by mouth daily.   fluticasone  50 MCG/ACT nasal spray Commonly known as: FLONASE  Place 2 sprays into both nostrils 2 (two) times daily.   isosorbide  mononitrate 30 MG 24 hr tablet Commonly known as: IMDUR  Take 1 tablet (30 mg total)  by mouth daily. Start taking on: May 31, 2024   levothyroxine  75 MCG tablet Commonly known as: SYNTHROID  Take 75 mcg by mouth daily before breakfast.   metoprolol  succinate 25 MG 24 hr tablet Commonly known as: TOPROL -XL Take 1 tablet (25 mg total) by mouth daily. Take with or immediately following a meal.   pantoprazole  20 MG tablet Commonly known  as: PROTONIX  Take 20 mg by mouth daily.   rosuvastatin  20 MG tablet Commonly known as: CRESTOR  Take 1 tablet (20 mg total) by mouth daily.   solifenacin 10 MG tablet Commonly known as: VESICARE Take 10 mg by mouth daily.   tamsulosin  0.4 MG Caps capsule Commonly known as: FLOMAX  Take 1 capsule (0.4 mg total) by mouth at bedtime. What changed: when to take this        Follow-up Information     [name], [name] Follow up in 2 week(s).   Specialty: Family Medicine Why: Hospital follow up Contact information: [address] [place] [address] [phone]                Discharge Exam: Fredricka Weights   05/28/24 1724 05/29/24 0552 05/30/24 0553  Weight: 64 kg 63.3 kg 61.4 kg   General exam: Awake, laying in bed, in nad Respiratory system: Normal respiratory effort, no wheezing Cardiovascular system: regular rate, s1, s2 Gastrointestinal system: Soft, nondistended, positive BS Central nervous system: CN2-12 grossly intact, strength intact Extremities: Perfused, no clubbing Skin: Normal skin turgor, no notable skin lesions seen Psychiatry: Mood normal // no visual hallucinations   Condition at discharge: fair  The results of significant diagnostics from this hospitalization (including imaging, microbiology, ancillary and laboratory) are listed below for reference.   Imaging Studies: ECHOCARDIOGRAM COMPLETE BUBBLE STUDY Result Date: 05/29/2024    ECHOCARDIOGRAM REPORT   Patient Name:   Bruce Nunez Date of Exam: 05/29/2024 Medical Rec #:  [id number]     Height:       67.0 in Accession #:    [id number]    Weight:       139.5 lb Date of Birth:  Aug 10, 1941      BSA:          1.735 m Patient Age:    83 years      BP:           153/69 mmHg Patient Gender: M             HR:           80 bpm. Exam Location:  Inpatient Procedure: 2D Echo and Saline Contrast Bubble Study (Both Spectral and Color            Flow Doppler were utilized during procedure). Indications:     Dyspnea  History:        Patient has prior history of Echocardiogram examinations.                 Pacemaker; Risk Factors:Hypertension.  Sonographer:    Charmaine Gaskins Referring Phys: [id number] MARSHA ADA IMPRESSIONS  1. Left ventricular ejection fraction, by estimation, is >75%. The left ventricle has hyperdynamic function. The left ventricle has no regional wall motion abnormalities. Left ventricular diastolic parameters were normal.  2. Right ventricular systolic function is normal. The right ventricular size is normal.  3. Trivial mitral valve regurgitation.  4. S/p AVR (21 mm Inspiris prosthesiss (procedure date 07/05/23) Leaflets move well. Peak and mean gradients through the valve are 29 and 16 mm Hg respectively.  Compared to echo from 2024, no significant change in mean gradient (14 to 16 mm Hg). The aortic valve has been repaired/replaced. Aortic valve regurgitation is not visualized.  5. Agitated saline contrast bubble study was negative, with no evidence of any interatrial shunt. FINDINGS  Left Ventricle: Left ventricular ejection fraction, by estimation, is >75%. The left ventricle has hyperdynamic function. The left ventricle has no regional wall motion abnormalities. The left ventricular internal cavity size was normal in size. There is no left ventricular hypertrophy. Left ventricular diastolic parameters were normal. Right Ventricle: The right ventricular size is normal. Right vetricular wall thickness was not assessed. Right ventricular systolic function is normal. Left Atrium: Left atrial size was normal in size. Right Atrium: Right atrial size was normal in size. Pericardium: There is no evidence of pericardial effusion. Mitral Valve: There is mild thickening of the mitral valve leaflet(s). Mild mitral annular calcification. Trivial mitral valve regurgitation. MV peak gradient, 9.2 mmHg. The mean mitral valve gradient is 4.0 mmHg. Tricuspid Valve: The tricuspid valve is normal in structure.  Tricuspid valve regurgitation is mild. Aortic Valve: S/p AVR (21 mm Inspiris prosthesiss (procedure date 07/05/23) Leaflets move well. Peak and mean gradients through the valve are 29 and 16 mm Hg respectively. Compared to echo from 2024, no significant change in mean gradient (14 to 16 mm Hg). The aortic valve has been repaired/replaced. Aortic valve regurgitation is not visualized. Aortic valve mean gradient measures 16.5 mmHg. Aortic valve peak gradient measures 31.2 mmHg. Aortic valve area, by VTI measures 1.99 cm. Pulmonic Valve: The pulmonic valve was normal in structure. Pulmonic valve regurgitation is trivial. Aorta: The aortic root is normal in size and structure. IAS/Shunts: No atrial level shunt detected by color flow Doppler. Agitated saline contrast was given intravenously to evaluate for intracardiac shunting. Agitated saline contrast bubble study was negative, with no evidence of any interatrial shunt. Additional Comments: A device lead is visualized.  LEFT VENTRICLE PLAX 2D LVIDd:         3.71 cm   Diastology LVIDs:         1.80 cm   LV e' medial:    6.09 cm/s LV PW:         0.85 cm   LV E/e' medial:  13.9 LV IVS:        0.83 cm   LV e' lateral:   13.60 cm/s LVOT diam:     2.01 cm   LV E/e' lateral: 6.2 LV SV:         88 LV SV Index:   51 LVOT Area:     3.17 cm  RIGHT VENTRICLE RV Basal diam:  2.65 cm RV Mid diam:    2.77 cm RV S prime:     13.20 cm/s LEFT ATRIUM             Index        RIGHT ATRIUM           Index LA diam:        3.42 cm 1.97 cm/m   RA Area:     13.10 cm LA Vol (A2C):   51.0 ml 29.39 ml/m  RA Volume:   30.10 ml  17.35 ml/m LA Vol (A4C):   54.2 ml 31.24 ml/m LA Biplane Vol: 53.7 ml 30.95 ml/m  AORTIC VALVE AV Area (Vmax):    1.74 cm AV Area (Vmean):   1.89 cm AV Area (VTI):     1.99 cm AV Vmax:  279.50 cm/s AV Vmean:          188.000 cm/s AV VTI:            0.444 m AV Peak Grad:      31.2 mmHg AV Mean Grad:      16.5 mmHg LVOT Vmax:         153.00 cm/s LVOT Vmean:         112.000 cm/s LVOT VTI:          0.278 m LVOT/AV VTI ratio: 0.63  AORTA Ao Asc diam: 3.00 cm MITRAL VALVE MV Area (PHT): 3.85 cm     SHUNTS MV Area VTI:   2.93 cm     Systemic VTI:  0.28 m MV Peak grad:  9.2 mmHg     Systemic Diam: 2.01 cm MV Mean grad:  4.0 mmHg MV Vmax:       1.52 m/s MV Vmean:      88.4 cm/s MV Decel Time: 197 msec MV E velocity: 84.80 cm/s MV A velocity: 156.00 cm/s MV E/A ratio:  0.54 Vina Gull MD Electronically signed by Vina Gull MD Signature Date/Time: 05/29/2024/5:27:07 PM    Final    DG Chest 2 View Result Date: 05/28/2024 CLINICAL DATA:  shortness of breath EXAM: CHEST - 2 VIEW COMPARISON:  Chest x-ray 11/07/2023, CT chest 08/16/2023 FINDINGS: Left chest wall 2 lead pacemaker. The heart and mediastinal contours are unchanged. Atherosclerotic plaque. No focal consolidation. No pulmonary edema. No pleural effusion. No pneumothorax. No acute osseous abnormality.  Sternotomy wires are intact. IMPRESSION: No active cardiopulmonary disease. Electronically Signed   By: Morgane  Naveau M.D.   On: 05/28/2024 18:00    Microbiology: Results for orders placed or performed during the hospital encounter of 05/28/24  Respiratory (~20 pathogens) panel by PCR     Status: None   Collection Time: 05/29/24  8:52 AM   Specimen: Nasopharyngeal Swab; Respiratory  Result Value Ref Range Status   Adenovirus NOT DETECTED NOT DETECTED Final   Coronavirus 229E NOT DETECTED NOT DETECTED Final    Comment: (NOTE) The Coronavirus on the Respiratory Panel, DOES NOT test for the novel  Coronavirus (2019 nCoV)    Coronavirus HKU1 NOT DETECTED NOT DETECTED Final   Coronavirus NL63 NOT DETECTED NOT DETECTED Final   Coronavirus OC43 NOT DETECTED NOT DETECTED Final   Metapneumovirus NOT DETECTED NOT DETECTED Final   Rhinovirus / Enterovirus NOT DETECTED NOT DETECTED Final   Influenza A NOT DETECTED NOT DETECTED Final   Influenza B NOT DETECTED NOT DETECTED Final   Parainfluenza Virus 1 NOT  DETECTED NOT DETECTED Final   Parainfluenza Virus 2 NOT DETECTED NOT DETECTED Final   Parainfluenza Virus 3 NOT DETECTED NOT DETECTED Final   Parainfluenza Virus 4 NOT DETECTED NOT DETECTED Final   Respiratory Syncytial Virus NOT DETECTED NOT DETECTED Final   Bordetella pertussis NOT DETECTED NOT DETECTED Final   Bordetella Parapertussis NOT DETECTED NOT DETECTED Final   Chlamydophila pneumoniae NOT DETECTED NOT DETECTED Final   Mycoplasma pneumoniae NOT DETECTED NOT DETECTED Final    Comment: Performed at [facility], [address], [place], [place] [address]    Labs: CBC: Recent Labs  Lab 05/28/24 1804 05/29/24 0646  WBC 7.1 6.7  NEUTROABS  --  3.8  HGB 10.2* 10.2*  HCT 35.9* 35.8*  MCV 68.5* 67.9*  PLT 211 186   Basic Metabolic Panel: Recent Labs  Lab 05/28/24 1804 05/29/24 0646  NA 139 139  K 4.7  4.1  CL 105 104  CO2 24 24  GLUCOSE 115* 103*  BUN 23 19  CREATININE 0.98 0.96  CALCIUM  9.6 9.4   Liver Function Tests: Recent Labs  Lab 05/29/24 0646  AST 18  ALT 13  ALKPHOS 53  BILITOT 1.8*  PROT 6.2*  ALBUMIN  3.6   CBG: No results for input(s): GLUCAP in the last 168 hours.  Discharge time spent: less than 30 minutes.  Signed: Garnette Pelt, MD [facility] 05/30/2024

## 2024-05-30 NOTE — Progress Notes (Signed)
 Progress Note  Patient Name: Bruce Nunez Date of Encounter: 05/30/2024  Primary Cardiologist: Redell Leiter, MD   Subjective   Doing well today no further chest pain.   Inpatient Medications    Scheduled Meds:  apixaban   5 mg Oral BID   empagliflozin   10 mg Oral Daily   fluticasone   2 spray Each Nare BID   fluticasone  furoate-vilanterol  1 puff Inhalation Daily   isosorbide  mononitrate  30 mg Oral Daily   levothyroxine   75 mcg Oral QAC breakfast   loratadine  10 mg Oral Daily   metoprolol  succinate  25 mg Oral Daily   pantoprazole   20 mg Oral Daily   rosuvastatin   20 mg Oral Daily   tamsulosin   0.4 mg Oral QHS   Continuous Infusions:  PRN Meds: acetaminophen  **OR** acetaminophen , ipratropium-albuterol    Vital Signs    Vitals:   05/30/24 0553 05/30/24 0833 05/30/24 0844 05/30/24 1144  BP: (!) 132/57   98/63  Pulse: 71  84 86  Resp: 20 20 19 20   Temp: 97.6 F (36.4 C) (!) 97.5 F (36.4 C)    TempSrc: Oral Oral    SpO2: 98%  97%   Weight: 61.4 kg     Height:        Intake/Output Summary (Last 24 hours) at 05/30/2024 1150 Last data filed at 05/29/2024 1400 Gross per 24 hour  Intake 177 ml  Output --  Net 177 ml   Filed Weights   05/28/24 1724 05/29/24 0552 05/30/24 0553  Weight: 64 kg 63.3 kg 61.4 kg    Telemetry  Off tele  ECG    NSR - Personally Reviewed  Physical Exam   GEN: No acute distress.   Neck: No JVD Cardiac: regular rhythm, normal rate, no murmurs, rubs, or gallops.  Respiratory: Clear to auscultation bilaterally. GI: Soft, nontender, non-distended  MS: No edema; No deformity. Neuro:  Nonfocal  Psych: Normal affect   Labs    Chemistry Recent Labs  Lab 05/28/24 1804 05/29/24 0646  NA 139 139  K 4.7 4.1  CL 105 104  CO2 24 24  GLUCOSE 115* 103*  BUN 23 19  CREATININE 0.98 0.96  CALCIUM  9.6 9.4  PROT  --  6.2*  ALBUMIN   --  3.6  AST  --  18  ALT  --  13  ALKPHOS  --  53  BILITOT  --  1.8*  GFRNONAA >60 >60   ANIONGAP 11 11     Hematology Recent Labs  Lab 05/28/24 1804 05/29/24 0646  WBC 7.1 6.7  RBC 5.24 5.27  HGB 10.2* 10.2*  HCT 35.9* 35.8*  MCV 68.5* 67.9*  MCH 19.5* 19.4*  MCHC 28.4* 28.5*  RDW 17.3* 17.2*  PLT 211 186    Cardiac EnzymesNo results for input(s): TROPONINI in the last 168 hours. No results for input(s): TROPIPOC in the last 168 hours.   BNP Recent Labs  Lab 05/28/24 1804  PROBNP 160.0     DDimer No results for input(s): DDIMER in the last 168 hours.   Radiology    ECHOCARDIOGRAM COMPLETE BUBBLE STUDY Result Date: 05/29/2024    ECHOCARDIOGRAM REPORT   Patient Name:   TANYA MARVIN Date of Exam: 05/29/2024 Medical Rec #:  [id number]     Height:       67.0 in Accession #:    [id number]    Weight:       139.5 lb Date of Birth:  Nov 23, 1940  BSA:          1.735 m Patient Age:    83 years      BP:           153/69 mmHg Patient Gender: M             HR:           80 bpm. Exam Location:  Inpatient Procedure: 2D Echo and Saline Contrast Bubble Study (Both Spectral and Color            Flow Doppler were utilized during procedure). Indications:    Dyspnea  History:        Patient has prior history of Echocardiogram examinations.                 Pacemaker; Risk Factors:Hypertension.  Sonographer:    Charmaine Gaskins Referring Phys: [id number] MARSHA ADA IMPRESSIONS  1. Left ventricular ejection fraction, by estimation, is >75%. The left ventricle has hyperdynamic function. The left ventricle has no regional wall motion abnormalities. Left ventricular diastolic parameters were normal.  2. Right ventricular systolic function is normal. The right ventricular size is normal.  3. Trivial mitral valve regurgitation.  4. S/p AVR (21 mm Inspiris prosthesiss (procedure date 07/05/23) Leaflets move well. Peak and mean gradients through the valve are 29 and 16 mm Hg respectively. Compared to echo from 2024, no significant change in mean gradient (14 to 16 mm Hg). The aortic valve has  been repaired/replaced. Aortic valve regurgitation is not visualized.  5. Agitated saline contrast bubble study was negative, with no evidence of any interatrial shunt. FINDINGS  Left Ventricle: Left ventricular ejection fraction, by estimation, is >75%. The left ventricle has hyperdynamic function. The left ventricle has no regional wall motion abnormalities. The left ventricular internal cavity size was normal in size. There is no left ventricular hypertrophy. Left ventricular diastolic parameters were normal. Right Ventricle: The right ventricular size is normal. Right vetricular wall thickness was not assessed. Right ventricular systolic function is normal. Left Atrium: Left atrial size was normal in size. Right Atrium: Right atrial size was normal in size. Pericardium: There is no evidence of pericardial effusion. Mitral Valve: There is mild thickening of the mitral valve leaflet(s). Mild mitral annular calcification. Trivial mitral valve regurgitation. MV peak gradient, 9.2 mmHg. The mean mitral valve gradient is 4.0 mmHg. Tricuspid Valve: The tricuspid valve is normal in structure. Tricuspid valve regurgitation is mild. Aortic Valve: S/p AVR (21 mm Inspiris prosthesiss (procedure date 07/05/23) Leaflets move well. Peak and mean gradients through the valve are 29 and 16 mm Hg respectively. Compared to echo from 2024, no significant change in mean gradient (14 to 16 mm Hg). The aortic valve has been repaired/replaced. Aortic valve regurgitation is not visualized. Aortic valve mean gradient measures 16.5 mmHg. Aortic valve peak gradient measures 31.2 mmHg. Aortic valve area, by VTI measures 1.99 cm. Pulmonic Valve: The pulmonic valve was normal in structure. Pulmonic valve regurgitation is trivial. Aorta: The aortic root is normal in size and structure. IAS/Shunts: No atrial level shunt detected by color flow Doppler. Agitated saline contrast was given intravenously to evaluate for intracardiac shunting.  Agitated saline contrast bubble study was negative, with no evidence of any interatrial shunt. Additional Comments: A device lead is visualized.  LEFT VENTRICLE PLAX 2D LVIDd:         3.71 cm   Diastology LVIDs:         1.80 cm   LV e' medial:  6.09 cm/s LV PW:         0.85 cm   LV E/e' medial:  13.9 LV IVS:        0.83 cm   LV e' lateral:   13.60 cm/s LVOT diam:     2.01 cm   LV E/e' lateral: 6.2 LV SV:         88 LV SV Index:   51 LVOT Area:     3.17 cm  RIGHT VENTRICLE RV Basal diam:  2.65 cm RV Mid diam:    2.77 cm RV S prime:     13.20 cm/s LEFT ATRIUM             Index        RIGHT ATRIUM           Index LA diam:        3.42 cm 1.97 cm/m   RA Area:     13.10 cm LA Vol (A2C):   51.0 ml 29.39 ml/m  RA Volume:   30.10 ml  17.35 ml/m LA Vol (A4C):   54.2 ml 31.24 ml/m LA Biplane Vol: 53.7 ml 30.95 ml/m  AORTIC VALVE AV Area (Vmax):    1.74 cm AV Area (Vmean):   1.89 cm AV Area (VTI):     1.99 cm AV Vmax:           279.50 cm/s AV Vmean:          188.000 cm/s AV VTI:            0.444 m AV Peak Grad:      31.2 mmHg AV Mean Grad:      16.5 mmHg LVOT Vmax:         153.00 cm/s LVOT Vmean:        112.000 cm/s LVOT VTI:          0.278 m LVOT/AV VTI ratio: 0.63  AORTA Ao Asc diam: 3.00 cm MITRAL VALVE MV Area (PHT): 3.85 cm     SHUNTS MV Area VTI:   2.93 cm     Systemic VTI:  0.28 m MV Peak grad:  9.2 mmHg     Systemic Diam: 2.01 cm MV Mean grad:  4.0 mmHg MV Vmax:       1.52 m/s MV Vmean:      88.4 cm/s MV Decel Time: 197 msec MV E velocity: 84.80 cm/s MV A velocity: 156.00 cm/s MV E/A ratio:  0.54 Vina Gull MD Electronically signed by Vina Gull MD Signature Date/Time: 05/29/2024/5:27:07 PM    Final    DG Chest 2 View Result Date: 05/28/2024 CLINICAL DATA:  shortness of breath EXAM: CHEST - 2 VIEW COMPARISON:  Chest x-ray 11/07/2023, CT chest 08/16/2023 FINDINGS: Left chest wall 2 lead pacemaker. The heart and mediastinal contours are unchanged. Atherosclerotic plaque. No focal consolidation. No  pulmonary edema. No pleural effusion. No pneumothorax. No acute osseous abnormality.  Sternotomy wires are intact. IMPRESSION: No active cardiopulmonary disease. Electronically Signed   By: Morgane  Naveau M.D.   On: 05/28/2024 18:00    Cardiac Studies   Echo 05/29/24:   1. Left ventricular ejection fraction, by estimation, is >75%. The left  ventricle has hyperdynamic function. The left ventricle has no regional  wall motion abnormalities. Left ventricular diastolic parameters were  normal.   2. Right ventricular systolic function is normal. The right ventricular  size is normal.   3. Trivial mitral valve regurgitation.   4. S/p AVR (21 mm Inspiris prosthesiss (  procedure date 07/05/23) Leaflets  move well. Peak and mean gradients through the valve are 29 and 16 mm Hg  respectively. Compared to echo from 2024, no significant change in mean  gradient (14 to 16 mm Hg). The  aortic valve has been repaired/replaced. Aortic valve regurgitation is not  visualized.   5. Agitated saline contrast bubble study was negative, with no evidence  of any interatrial shunt.     Assessment & Plan   Principal Problem:   Exertional dyspnea   CAD s/p CABG [LIMA to LAD] Elevated Troponin flat x 2 and not consistent with ACS Stable angina Continue Imdur  started this admission Discontinued ASA as patient has history of GI bleed  Discontinued plavix  as this was only for short term use while patient was off anticoagulation for his L CEA Continue metoprolol  succinate 25 mg  Patient on jardiance  for T2DM, continue for added CVD benefit   Hyperlipidemia LDL 32   HDL 40 from 03/2024 Continue crestor  20 mg   Orthostatic hypotension  PTA midodrine   Patient shared his BP has been elevated at home. Will defer to outpatient team for further management as it is on hold here and patient had been dealing with recurrent falls.  Advised to get up slowly and consider an abdominal binder after discharge since he  already uses compression stockings and BP may be lower now that he's on Imdur . Advised to keep his appointment already scheduled in the office.    AS s/p AVR stable  pAF CHB s/p PPM Interrogation was completed. No arrhthymias or AF noted on device.  [place] Vasc score 7 Continue eliquis  5 mg BID Continue metoprolol  succinate 25 mg   BPH Flomax  to be taken at bedtime now to help reduce orthostatic hypotension episodes during the daytime   Per primary Hx of CVA T2DM GERD Hypothyroidism Carotid artery stenosis s/p L CEA  [facility] will sign off.   Being discharged today and has cardiology follow up scheduled.    For questions or updates, please contact [facility] Please consult [email or web address] for contact info under        Signed, Emeline FORBES Calender, MD  05/30/2024, 11:50 AM

## 2024-06-01 DIAGNOSIS — D509 Iron deficiency anemia, unspecified: Secondary | ICD-10-CM | POA: Insufficient documentation

## 2024-06-01 DIAGNOSIS — Z85528 Personal history of other malignant neoplasm of kidney: Secondary | ICD-10-CM | POA: Insufficient documentation

## 2024-06-01 NOTE — Progress Notes (Unsigned)
 Cardiology Office Note:    Date:  06/02/2024   ID:  Bruce Nunez, DOB 21-Jul-1941, MRN [id number]  PCP:  [name], PA  Cardiologist:  Redell Leiter, MD    Referring MD: [name], [name]    ASSESSMENT:    1. S/P AVR (aortic valve replacement)   2. S/P CABG x 1   3. Orthostatic hypotension    PLAN:    In order of problems listed above:  It is a goal to pull together the events of the hospital and a precise diagnosis His presentation was not cardiac yet he is improved by adding Imdur  and no further chest discomfort or shortness of breath will continue Although he has orthostatic hypotension his midodrine  was stopped.  I will have his wife give it to him if he has systolics less than 100 in the morning when he stands His cortisol was diminished but there was no follow-up I will have him come back to my office tomorrow get a fasting 8 AM level and if it is low refer him to endocrinology for a Cortrosyn stimulation test   Next appointment: 6 months   Medication Adjustments/Labs and Tests Ordered: Current medicines are reviewed at length with the patient today.  Concerns regarding medicines are outlined above.  No orders of the defined types were placed in this encounter.  No orders of the defined types were placed in this encounter.    History of Present Illness:    Bruce Nunez is a 83 y.o. male with a hx of very complicated heart disease including surgical AVR and CABG complicated by stroke heart block pacemaker paroxysmal atrial fibrillation and recurrent symptomatic hypotension and orthostatic hypotension last seen 10/04/2023.  He has required midodrine  support  Is admitted to [facility] last week 48 hours increasing shortness of breath.  He was seen by heart care.  It seemed like the most significant problem was orthostatic hypotension and the only change was to remove his alpha-blocker Flomax  to bedtime.  There is no evidence of acute coronary syndrome.  He  did have an echocardiogram showed ejection fraction of 75% normal diastolic function normal right ventricular size and function bioprosthetic aortic valve function also normal.  He had a fasting cortisol level performed that was low and proBNP level was low.  Chest x-ray showed no active cardiopulmonary disease.  Compliance with diet, lifestyle and medications: Yes  Is a difficult office visit his wife tells me he was taken midodrine  up to the hospitalization had not been stopped but it was stopped in hospital Previous had very severe refractory orthostatic hypotension He still is lightheaded in the mornings but standing blood pressure is unchanged to my office today Prior to discharge he had a fasting cortisol level that was low but he had no follow-up Since hospital discharge he has no shortness of breath or chest pain Past Medical History:  Diagnosis Date   Anemia    recent iron supplement   Anemia, iron deficiency 06/01/2024   Angina pectoris 05/02/2023   Aortic regurgitation 03/12/2017   Aortic stenosis 04/29/2019   Asthma    childhood-   2013 bronchitis w/wheezing   Carotid artery occlusion    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   Complication of anesthesia     ether made me sick - woke up during colonoscopy   Diabetes mellitus without complication (HCC)    Endotracheally intubated 07/05/2023   Essential hypertension 03/12/2017   Exertional dyspnea 05/28/2024   Frequency of urination  GERD (gastroesophageal reflux disease)    occasional   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   Heart murmur    Hemorrhoids    History of kidney cancer    History of kidney stones    Hx of colon cancer, stage I    Hx of nonmelanoma skin cancer    Hx of peptic ulcer    as teenager   Hx of transfusion    Hyperlipidemia    Hypertension    Inguinal hernia    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   Muscle atrophy 07/20/2023   Muscle weakness 07/20/2023   Orthostatic  hypotension 01/06/2024   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   PAF (paroxysmal atrial fibrillation) (HCC) 08/20/2023   PONV (postoperative nausea and vomiting)    only when had Ether as Anesthesia drug   Preop cardiovascular exam 03/09/2024   Prostate cancer (HCC) 08/15/2012   also Hx colon cancer / Hx skin cancer   Right inguinal hernia 05/08/2016   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   S/P right inguinal hernia repair Sept 2015 05/17/2014   Recurrent right indirect  Hernia treated with robotic TAPP repair with enterolysis Sept 2017   Stroke [facility]) 03/28/2022   Symptomatic carotid artery stenosis, left 04/10/2024   Syncope 08/17/2023    Current Medications: Current Meds  Medication Sig   acetaminophen  (TYLENOL ) 325 MG tablet Take 1-2 tablets (325-650 mg total) by mouth every 6 (six) hours as needed for mild pain (pain score 1-3).   albuterol  (PROVENTIL  HFA;VENTOLIN  HFA) 108 (90 BASE) MCG/ACT inhaler Inhale 2 puffs into the lungs every 6 (six) hours as needed for shortness of breath.   apixaban  (ELIQUIS ) 5 MG TABS tablet Take 1 tablet (5 mg total) by mouth 2 (two) times daily.   budesonide -formoterol  (SYMBICORT) 160-4.5 MCG/ACT inhaler Inhale 2 puffs into the lungs 2 (two) times daily.   cyanocobalamin  (,VITAMIN B-12,) 1000 MCG/ML injection Inject 1,000 mcg into the muscle every 30 (thirty) days.   empagliflozin  (JARDIANCE ) 10 MG TABS tablet Take 1 tablet (10 mg total) by mouth daily.   fexofenadine (ALLEGRA) 180 MG tablet Take 180 mg by mouth daily.   fluticasone  (FLONASE ) 50 MCG/ACT nasal spray Place 2 sprays into both nostrils 2 (two) times daily.   isosorbide  mononitrate (IMDUR ) 30 MG 24 hr tablet Take 1 tablet (30 mg total) by mouth daily.   levothyroxine  (SYNTHROID ) 75 MCG tablet Take 75 mcg by mouth daily before breakfast.   metoprolol  succinate (TOPROL -XL) 25 MG 24 hr tablet Take 1 tablet (25 mg total) by mouth daily. Take with or immediately following a  meal.   pantoprazole  (PROTONIX ) 20 MG tablet Take 20 mg by mouth daily.   rosuvastatin  (CRESTOR ) 20 MG tablet Take 1 tablet (20 mg total) by mouth daily.   solifenacin (VESICARE) 10 MG tablet Take 10 mg by mouth daily.   tamsulosin  (FLOMAX ) 0.4 MG CAPS capsule Take 1 capsule (0.4 mg total) by mouth at bedtime.      EKGs/Labs/Other Studies Reviewed:    The following studies were reviewed today:  Cardiac Studies & Procedures   ______________________________________________________________________________________________ CARDIAC CATHETERIZATION  CARDIAC CATHETERIZATION 05/03/2023  Conclusion   Prox RCA to Mid RCA lesion is 50% stenosed.   Mid LM to Dist LM lesion is 30% stenosed.   Prox Cx to Dist Cx lesion is 50% stenosed.   Prox LAD to Mid LAD lesion is 50% stenosed.   1st Diag lesion is 60% stenosed.   There  is severe (4+) aortic regurgitation.  1.  Moderate multivessel coronary artery disease with mild distal left main plaquing of 30%, moderate diffuse LAD stenosis of 50%, mild to moderate mid circumflex stenosis of 40 to 50%, and moderate mid RCA stenosis of 50%. 2.  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  Severe right subclavian tortuosity makes cardiac catheterization very difficult from right radial access.  If patient requires future cardiac catheterization, avoid right radial access.  Recommendations: Heart team review, cardiac CTA (TAVR protocol) to evaluate if there is enough aortic valve calcification to perform TAVR as aortic insufficiency appears to be the primary valve lesion.  The patient has a very wide pulse pressure and angiographically has severe AI.  Findings Coronary Findings Diagnostic  Dominance: Right  Left Main Mid LM to Dist LM lesion is 30% stenosed.  Left Anterior Descending The LAD has moderate diffuse 50% stenosis throughout the proximal and mid vessel.  The first diagonal has a 50 to 60% ostial stenosis. Prox LAD  to Mid LAD lesion is 50% stenosed.  First Diagonal Branch 1st Diag lesion is 60% stenosed.  Left Circumflex There is mild diffuse disease throughout the vessel. Prox Cx to Dist Cx lesion is 50% stenosed. The lesion is moderately calcified.  Right Coronary Artery There is mild diffuse disease throughout the vessel. Dominant vessel with no high-grade CAD.  There is moderate diffuse calcification present.  The mid RCA has a 50% stenosis.  The PDA and PLA branches are patent with no significant stenoses.  The distal vessel has mild diffuse plaquing with no stenosis. Prox RCA to Mid RCA lesion is 50% stenosed. The lesion is moderately calcified.  Intervention  No interventions have been documented.     ECHOCARDIOGRAM  ECHOCARDIOGRAM COMPLETE BUBBLE STUDY 05/29/2024  Narrative ECHOCARDIOGRAM REPORT    Patient Name:   KALEEM SARTWELL Date of Exam: 05/29/2024 Medical Rec #:  [id number]     Height:       67.0 in Accession #:    [id number]    Weight:       139.5 lb Date of Birth:  15-Apr-1941      BSA:          1.735 m Patient Age:    83 years      BP:           153/69 mmHg Patient Gender: M             HR:           80 bpm. Exam Location:  Inpatient  Procedure: 2D Echo and Saline Contrast Bubble Study (Both Spectral and Color Flow Doppler were utilized during procedure).  Indications:    Dyspnea  History:        Patient has prior history of Echocardiogram examinations. Pacemaker; Risk Factors:Hypertension.  Sonographer:    Charmaine Gaskins Referring Phys: [id number] MARSHA ADA  IMPRESSIONS   1. Left ventricular ejection fraction, by estimation, is >75%. The left ventricle has hyperdynamic function. The left ventricle has no regional wall motion abnormalities. Left ventricular diastolic parameters were normal. 2. Right ventricular systolic function is normal. The right ventricular size is normal. 3. Trivial mitral valve regurgitation. 4. S/p AVR (21 mm Inspiris prosthesiss (procedure  date 07/05/23) Leaflets move well. Peak and mean gradients through the valve are 29 and 16 mm Hg respectively. Compared to echo from 2024, no significant change in mean gradient (14 to 16 mm Hg). The aortic valve has been repaired/replaced. Aortic valve  regurgitation is not visualized. 5. Agitated saline contrast bubble study was negative, with no evidence of any interatrial shunt.  FINDINGS Left Ventricle: Left ventricular ejection fraction, by estimation, is >75%. The left ventricle has hyperdynamic function. The left ventricle has no regional wall motion abnormalities. The left ventricular internal cavity size was normal in size. There is no left ventricular hypertrophy. Left ventricular diastolic parameters were normal.  Right Ventricle: The right ventricular size is normal. Right vetricular wall thickness was not assessed. Right ventricular systolic function is normal.  Left Atrium: Left atrial size was normal in size.  Right Atrium: Right atrial size was normal in size.  Pericardium: There is no evidence of pericardial effusion.  Mitral Valve: There is mild thickening of the mitral valve leaflet(s). Mild mitral annular calcification. Trivial mitral valve regurgitation. MV peak gradient, 9.2 mmHg. The mean mitral valve gradient is 4.0 mmHg.  Tricuspid Valve: The tricuspid valve is normal in structure. Tricuspid valve regurgitation is mild.  Aortic Valve: S/p AVR (21 mm Inspiris prosthesiss (procedure date 07/05/23) Leaflets move well. Peak and mean gradients through the valve are 29 and 16 mm Hg respectively. Compared to echo from 2024, no significant change in mean gradient (14 to 16 mm Hg). The aortic valve has been repaired/replaced. Aortic valve regurgitation is not visualized. Aortic valve mean gradient measures 16.5 mmHg. Aortic valve peak gradient measures 31.2 mmHg. Aortic valve area, by VTI measures 1.99 cm.  Pulmonic Valve: The pulmonic valve was normal in structure. Pulmonic  valve regurgitation is trivial.  Aorta: The aortic root is normal in size and structure.  IAS/Shunts: No atrial level shunt detected by color flow Doppler. Agitated saline contrast was given intravenously to evaluate for intracardiac shunting. Agitated saline contrast bubble study was negative, with no evidence of any interatrial shunt.  Additional Comments: A device lead is visualized.   LEFT VENTRICLE PLAX 2D LVIDd:         3.71 cm   Diastology LVIDs:         1.80 cm   LV e' medial:    6.09 cm/s LV PW:         0.85 cm   LV E/e' medial:  13.9 LV IVS:        0.83 cm   LV e' lateral:   13.60 cm/s LVOT diam:     2.01 cm   LV E/e' lateral: 6.2 LV SV:         88 LV SV Index:   51 LVOT Area:     3.17 cm   RIGHT VENTRICLE RV Basal diam:  2.65 cm RV Mid diam:    2.77 cm RV S prime:     13.20 cm/s  LEFT ATRIUM             Index        RIGHT ATRIUM           Index LA diam:        3.42 cm 1.97 cm/m   RA Area:     13.10 cm LA Vol (A2C):   51.0 ml 29.39 ml/m  RA Volume:   30.10 ml  17.35 ml/m LA Vol (A4C):   54.2 ml 31.24 ml/m LA Biplane Vol: 53.7 ml 30.95 ml/m AORTIC VALVE AV Area (Vmax):    1.74 cm AV Area (Vmean):   1.89 cm AV Area (VTI):     1.99 cm AV Vmax:           279.50 cm/s AV Vmean:  188.000 cm/s AV VTI:            0.444 m AV Peak Grad:      31.2 mmHg AV Mean Grad:      16.5 mmHg LVOT Vmax:         153.00 cm/s LVOT Vmean:        112.000 cm/s LVOT VTI:          0.278 m LVOT/AV VTI ratio: 0.63  AORTA Ao Asc diam: 3.00 cm  MITRAL VALVE MV Area (PHT): 3.85 cm     SHUNTS MV Area VTI:   2.93 cm     Systemic VTI:  0.28 m MV Peak grad:  9.2 mmHg     Systemic Diam: 2.01 cm MV Mean grad:  4.0 mmHg MV Vmax:       1.52 m/s MV Vmean:      88.4 cm/s MV Decel Time: 197 msec MV E velocity: 84.80 cm/s MV A velocity: 156.00 cm/s MV E/A ratio:  0.54  Vina Gull MD Electronically signed by Vina Gull MD Signature Date/Time: 05/29/2024/5:27:07  PM    Final   TEE  ECHO INTRAOPERATIVE TEE 07/05/2023  Narrative *INTRAOPERATIVE TRANSESOPHAGEAL REPORT *    Patient Name:   LEDGER HEINDL  Date of Exam: 07/05/2023 Medical Rec #:  [id number]      Height:       67.0 in Accession #:    [id number]     Weight:       146.3 lb Date of Birth:  April 06, 1941       BSA:          1.77 m Patient Age:    82 years       BP:           161/54 mmHg Patient Gender: M              HR:           81 bpm. Exam Location:  Anesthesiology  Transesophogeal exam was perform intraoperatively during surgical procedure. Patient was closely monitored under general anesthesia during the entirety of examination.  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 POST-OP IMPRESSIONS _ Left Ventricle: The left ventricle is unchanged from pre-bypass. _ Right Ventricle: The right ventricle appears unchanged from pre-bypass. _ Aorta: The aorta appears unchanged from pre-bypass. _ Left Atrial Appendage: The left atrial appendage appears unchanged from pre-bypass. _ Aortic Valve: A bioprosthetic valve was placed, leaflets are freely mobile Size; 21mm. The gradient recorded across the prosthetic valve is within the expected range. No perivalvular leak noted. Mean PG _ Mitral Valve: The mitral valve appears unchanged from pre-bypass. _ Tricuspid Valve: The tricuspid valve appears unchanged from pre-bypass. _ Pulmonic Valve: The pulmonic valve appears unchanged from pre-bypass. _ Interatrial Septum: The interatrial septum appears unchanged from pre-bypass. _ Pericardium: The pericardium appears unchanged from pre-bypass. _ Comments: S/P AVR CABG. No new or worsening wall motion or valvular abnormalities post procedure. 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 Seated appropriately without rock or  leak. Gradients WNL.  PRE-OP FINDINGS Left Ventricle: The left ventricle has normal systolic function, with an ejection fraction of 60-65%. The cavity size was normal. Left ventricular diastolic parameters are consistent with Grade I diastolic dysfunction (impaired relaxation).   Right Ventricle: The right ventricle has normal systolic function. The cavity was normal. There is  no increase in right ventricular wall thickness. Right ventricular systolic pressure is normal.  Left Atrium: Left atrial size was normal in size. No left atrial/left atrial appendage thrombus was detected. Left atrial appendage velocity is normal at greater than 40 cm/s.  Right Atrium: Right atrial size was normal in size.  Interatrial Septum: No atrial level shunt detected by color flow Doppler. The interatrial septum appears to be lipomatous. There is no evidence of a patent foramen ovale.  Pericardium: There is no evidence of pericardial effusion. There is no pleural effusion.  Mitral Valve: The mitral valve is normal in structure. Mitral valve regurgitation is trivial by color flow Doppler. There is no evidence of mitral valve vegetation. There is No evidence of mitral stenosis.  Tricuspid Valve: The tricuspid valve was normal in structure. Tricuspid valve regurgitation is trivial by color flow Doppler. No evidence of tricuspid stenosis is present. There is no evidence of tricuspid valve vegetation.  Aortic Valve: The aortic valve is tricuspid. Aortic valve regurgitation is moderate to severe. The jet is eccentric anteriorly directed. There is mild stenosis of the aortic valve, with a calculated valve area of 1.40 cm. AI PHT , VC .7cm, diastolic reversal descending thoracic aorta, EROA PISA .32cm2.  Pulmonic Valve: The pulmonic valve was normal in structure, with normal. No evidence of pumonic stenosis. Pulmonic valve regurgitation is mild by color flow Doppler.   Aorta: The aortic root and ascending aorta  are normal in size and structure.  Pulmonary Artery: Norva Purl catheter present on the right. The pulmonary artery is of normal size.  Shunts: There is no evidence of an atrial septal defect.  +--------------+--------++ LEFT VENTRICLE         +--------------+--------++ PLAX 2D                +--------------+--------++ LVIDd:        4.80 cm  +--------------+--------++ LVIDs:        3.00 cm  +--------------+--------++ LVOT diam:    2.00 cm  +--------------+--------++ LV SV:        73 ml    +--------------+--------++ LV SV Index:  40.86    +--------------+--------++ LVOT Area:    3.14 cm +--------------+--------++                        +--------------+--------++  +------------------+------------++ AORTIC VALVE                   +------------------+------------++ AV Area (Vmax):   1.34 cm     +------------------+------------++ AV Area (Vmean):  1.36 cm     +------------------+------------++ AV Area (VTI):    1.40 cm     +------------------+------------++ AV Vmax:          279.00 cm/s  +------------------+------------++ AV Vmean:         193.000 cm/s +------------------+------------++ AV VTI:           0.601 m      +------------------+------------++ AV Peak Grad:     31.1 mmHg    +------------------+------------++ AV Mean Grad:     17.0 mmHg    +------------------+------------++ LVOT Vmax:        119.00 cm/s  +------------------+------------++ LVOT Vmean:       83.800 cm/s  +------------------+------------++ LVOT VTI:         0.267 m      +------------------+------------++ LVOT/AV VTI ratio:0.44         +------------------+------------++  +-------------+-------++ AORTA                +-------------+-------++  Ao Root diam:3.10 cm +-------------+-------++ Ao STJ diam: 2.4 cm  +-------------+-------++ Ao Asc diam: 3.20  cm +-------------+-------++  +--------------+----------++ MITRAL VALVE              +--------------+-------+ +--------------+----------++  SHUNTS                MV Area (PHT):3.63 cm    +--------------+-------+ +--------------+----------++  Systemic VTI: 0.27 m  MV Peak grad: 3.0 mmHg    +--------------+-------+ +--------------+----------++  Systemic Diam:2.00 cm MV Mean grad: 1.0 mmHg    +--------------+-------+ +--------------+----------++ MV Vmax:      0.86 m/s   +--------------+----------++ MV Vmean:     53.1 cm/s  +--------------+----------++ MV PHT:       60.61 msec +--------------+----------++ MV Decel Time:209 msec   +--------------+----------++ +--------------+----------++ MV E velocity:63.50 cm/s +--------------+----------++ MV A velocity:80.40 cm/s +--------------+----------++ MV E/A ratio: 0.79       +--------------+----------++   Cordella Fix Electronically signed by Cordella Fix Signature Date/Time: 07/06/2023/3:06:06 PM    Final  MONITORS  LONG TERM MONITOR (3-14 DAYS) 04/23/2022  Narrative Patch Wear Time:  13 days and 22 hours (2023-08-02T11:41:54-0400 to 2023-08-16T10:11:23-0400)  Patient had a min HR of 52 bpm, max HR of 122 bpm, and avg HR of 68 bpm. Predominant underlying rhythm was Sinus Rhythm.  There were no sinus pauses of 3 seconds or greater and no episodes of second or third-degree AV nodal block.  There were no triggered or diary events.  4 Supraventricular Tachycardia runs occurred, the run with the fastest interval lasting 4 beats with a max rate of 122 bpm, the longest lasting 6 beats with an avg rate of 94 bpm. Isolated SVEs were rare (<1.0%), SVE Couplets were rare (<1.0%), and SVE Triplets were rare (<1.0%).  Isolated VEs were rare (<1.0%), and no VE Couplets or VE Triplets were present. Ventricular Trigeminy was present.   CT SCANS  CT CORONARY MORPH W/CTA COR  W/SCORE 04/23/2023  Addendum 05/09/2023 10:39 AM ADDENDUM REPORT: 05/09/2023 10:37  EXAM: OVER-READ INTERPRETATION  CT CHEST  The following report is an over-read performed by radiologist Dr. Fonda Mom [facility] on 05/09/2023. This over-read does not include interpretation of cardiac or coronary anatomy or pathology. The coronary CTA interpretation by the cardiologist is attached.  COMPARISON:  None.  FINDINGS: Cardiovascular:  See findings discussed in the body of the report.  Mediastinum/Nodes: No suspicious adenopathy identified. Imaged mediastinal structures are unremarkable.  Lungs/Pleura: Imaged lungs are clear. No pleural effusion or pneumothorax.  Upper Abdomen: No acute abnormality.  Musculoskeletal: No chest wall abnormality. There are thoracic degenerative changes.  IMPRESSION: No significant extracardiac incidental findings identified.   Electronically Signed By: Fonda Field M.D. On: 05/09/2023 10:37  Narrative CLINICAL DATA:  Chest pain  EXAM: Cardiac/Coronary CTA  TECHNIQUE: A non-contrast, gated CT scan was obtained with axial slices of 3 mm through the heart for calcium  scoring. Calcium  scoring was performed using the Agatston method. A 120 kV prospective, gated, contrast cardiac scan was obtained. Gantry rotation speed was 250 msecs and collimation was 0.6 mm. Two sublingual nitroglycerin  tablets (0.8 mg) were given. The 3D data set was reconstructed in 5% intervals of the 35-75% of the R-R cycle. Diastolic phases were analyzed on a dedicated workstation using MPR, MIP, and VRT modes. The patient received 95 cc of contrast.  FINDINGS: Image quality: Fair. Significant blooming artifact, respiratory motion.  Noise artifact is: Mild  Coronary Arteries:  Normal coronary origin.  Right dominance.  Left main: The  left main is a large caliber vessel with a normal take off from the left coronary cusp that bifurcates to  form a left anterior descending artery and a left circumflex artery. There is mild calcified plaque in the distal LM with associated stenosis of 25-49%.  Left anterior descending artery: The LAD gives off 2 patent diagonal branches. There is diffuse mild to moderate calcified plaque in the proximal and mid LAD with associated stenosis of 25-49% but at some places could be > 50%. This may be overestimated in the setting of blooming artifact.  Left circumflex artery: The LCX is non-dominant and gives off 1 patent obtuse marginal branch. There is diffuse calcified plaque in the ostial, proximal and mid LCx with associated stenosis of 25-49% with a possible moderate to severe focal stenosis of at least 50-69% and possibly >70% in the mid LCx.  Right coronary artery: The RCA is dominant with normal take off from the right coronary cusp. The RCA terminates as a PDA and right posterolateral branch without evidence of plaque or stenosis. There is mild scattered calcified plaque in the throughout RCA with associated stenosis of 25-49%. There is moderate calcified plaque in the mid RCA with associated stenosis of 50-69%.  Right Atrium: Right atrial size is within normal limits.  Right Ventricle: The right ventricular cavity is within normal limits.  Left Atrium: Left atrial size is normal in size with no left atrial appendage filling defect.  Left Ventricle: The ventricular cavity size is within normal limits.  Pulmonary arteries: Normal in size.  Pulmonary veins: Normal pulmonary venous drainage.  Pericardium: Normal thickness without significant effusion or calcium  present.  Cardiac valves: The aortic valve is trileaflet without significant calcification. The mitral valve is normal without significant calcification.  Aorta: Normal caliber with scattered calcifications.  Extra-cardiac findings: See attached radiology report for non-cardiac structures.  IMPRESSION: 1.  Coronary calcium  score of 812. This was 60th percentile for age-, sex, and race-matched controls.  2. Total plaque volume 663 mm3 which is 40th percentile for age- and sex-matched controls (calcified plaque 88 mm3; non-calcified plaque 575 mm3). TPV is severe.  3. Normal coronary origin with right dominance.  4. Mild to moderate atherosclerosis. 25-49% distal LM/proximal to mid LAD. 25-49% ostial/proximal/mid LCx and diffusely throughout the RCA. Possible calcified plaque >50% in the mid RCA/LCx and LAD.  5. Recommend preventive therapy and risk factor modification.  6. This study has been submitted for FFR analysis.  RECOMMENDATIONS: 1. CAD-RADS 0: No evidence of CAD (0%). Consider non-atherosclerotic causes of chest pain.  2. CAD-RADS 1: Minimal non-obstructive CAD (0-24%). Consider non-atherosclerotic causes of chest pain. Consider preventive therapy and risk factor modification.  3. CAD-RADS 2: Mild non-obstructive CAD (25-49%). Consider non-atherosclerotic causes of chest pain. Consider preventive therapy and risk factor modification.  4. CAD-RADS 3: Moderate stenosis. Consider symptom-guided anti-ischemic pharmacotherapy as well as risk factor modification per guideline directed care. Additional analysis with CT FFR will be submitted.  5. CAD-RADS 4: Severe stenosis. (70-99% or > 50% left main). Cardiac catheterization or CT FFR is recommended. Consider symptom-guided anti-ischemic pharmacotherapy as well as risk factor modification per guideline directed care. Invasive coronary angiography recommended with revascularization per published guideline statements.  6. CAD-RADS 5: Total coronary occlusion (100%). Consider cardiac catheterization or viability assessment. Consider symptom-guided anti-ischemic pharmacotherapy as well as risk factor modification per guideline directed care.  7. CAD-RADS N: Non-diagnostic study. Obstructive CAD can't be excluded.  Alternative evaluation is recommended.  Wilbert Bihari, MD  Electronically  Signed: By: Wilbert Bihari M.D. On: 04/30/2023 19:47   CARDIAC MRI  MR CARDIAC MORPHOLOGY W WO CONTRAST 03/21/2022  Narrative CLINICAL DATA:  Evaluate aortic regurgitation  EXAM: CARDIAC MRI  TECHNIQUE: The patient was scanned on a 1.5 Tesla Siemens magnet. A dedicated cardiac coil was used. Functional imaging was done using Fiesta sequences. 2,3, and 4 chamber views were done to assess for RWMA's. Modified Simpson's rule using a short axis stack was used to calculate an ejection fraction on a dedicated work [employer]. The patient received 10 cc of Gadavist . After 10 minutes inversion recovery sequences were used to assess for infiltration and scar tissue. Phase contrast velocity mapping performed.  CONTRAST:  10 cc  of Gadavist   FINDINGS: 1. Normal left ventricular size, thickness and systolic function (LVEF = 54%). There are no regional wall motion abnormalities.  There is no late gadolinium enhancement in the left ventricular myocardium.  LVEDV: 123 ml  LVESV: 57 ml  SV: 66 ml  Myocardial mass: 86 g  2. Normal right ventricular size, thickness and systolic function (RVEF = 63%). There are no regional wall motion abnormalities.  3.  Normal left and right atrial size.  4. Normal size of the aortic root, ascending aorta and pulmonary artery.  5. Moderate aortic regurgitation. Aortic valve measurements: regurgitant fraction = 37%, regurgitant volume = 24 ml  Mild tricuspid regurgitation.  6.  Normal pericardium.  No pericardial effusion.  IMPRESSION: 1.  Normal Biventricular size and function.  LVEF 54%, RVEF 63%.  2.  Moderate aortic valve regurgitation.  Regurgitant fraction 37%.  3.  No evidence of late gadolinium enhancement or infiltration.  4.  Mild tricuspid regurgitation.  Redell Cave   Electronically Signed By: Redell Cave M.D. On:  03/26/2022 17:40   ______________________________________________________________________________________________          Recent Labs: 08/18/2023: Magnesium  1.9 11/13/2023: TSH 2.000 05/28/2024: Pro Brain Natriuretic Peptide 160.0 05/29/2024: ALT 13; BUN 19; Creatinine, Ser 0.96; Hemoglobin 10.2; Platelets 186; Potassium 4.1; Sodium 139  Recent Lipid Panel    Component Value Date/Time   CHOL 85 04/11/2024 0339   CHOL 95 (L) 03/21/2023 1059   TRIG 64 04/11/2024 0339   HDL 40 (L) 04/11/2024 0339   HDL 32 (L) 03/21/2023 1059   CHOLHDL 2.1 04/11/2024 0339   VLDL 13 04/11/2024 0339   LDLCALC 32 04/11/2024 0339   LDLCALC 39 03/21/2023 1059    Physical Exam:    VS:  BP 114/70   Pulse 78   Ht 5' 7 (1.702 m)   Wt 142 lb (64.4 kg)   SpO2 98%   BMI 22.24 kg/m     Wt Readings from Last 3 Encounters:  06/02/24 142 lb (64.4 kg)  05/30/24 135 lb 6.4 oz (61.4 kg)  05/21/24 142 lb 1.6 oz (64.5 kg)     GEN: He looks improved well nourished, well developed in no acute distress HEENT: Normal NECK: No JVD; No carotid bruits LYMPHATICS: No lymphadenopathy CARDIAC: RRR, no murmurs, rubs, gallops RESPIRATORY:  Clear to auscultation without rales, wheezing or rhonchi  ABDOMEN: Soft, non-tender, non-distended MUSCULOSKELETAL:  No edema; No deformity  SKIN: Warm and dry NEUROLOGIC:  Alert and oriented x 3 PSYCHIATRIC:  Normal affect    Signed, Redell Leiter, MD  06/02/2024 11:33 AM    [facility]

## 2024-06-02 ENCOUNTER — Encounter: Payer: Self-pay | Admitting: Cardiology

## 2024-06-02 ENCOUNTER — Ambulatory Visit: Attending: Cardiology | Admitting: Cardiology

## 2024-06-02 VITALS — BP 114/70 | HR 78 | Ht 67.0 in | Wt 142.0 lb

## 2024-06-02 DIAGNOSIS — I951 Orthostatic hypotension: Secondary | ICD-10-CM | POA: Diagnosis not present

## 2024-06-02 DIAGNOSIS — Z951 Presence of aortocoronary bypass graft: Secondary | ICD-10-CM | POA: Diagnosis not present

## 2024-06-02 DIAGNOSIS — Z952 Presence of prosthetic heart valve: Secondary | ICD-10-CM

## 2024-06-02 NOTE — Addendum Note (Signed)
 Addended by: SHERRE ADE I on: 06/02/2024 12:05 PM   Modules accepted: Orders

## 2024-06-02 NOTE — Patient Instructions (Addendum)
 Medication Instructions:   START: Midodrine  5 mg (Please take this medication in the AM if standing blood pressure is less than 100).  *If you need a refill on your cardiac medications before your next appointment, please call your pharmacy*  Lab Work: Your physician recommends that you return for lab work in:   Labs at 8 am: fasting cortisol  If you have labs (blood work) drawn today and your tests are completely normal, you will receive your results only by: MyChart Message (if you have MyChart) OR A paper copy in the mail If you have any lab test that is abnormal or we need to change your treatment, we will call you to review the results.  Testing/Procedures: None  Follow-Up: At [facility], you and your health needs are our priority.  As part of our continuing mission to provide you with exceptional heart care, our providers are all part of one team.  This team includes your primary Cardiologist (physician) and Advanced Practice Providers or APPs (Physician Assistants and Nurse Practitioners) who all work together to provide you with the care you need, when you need it.  Your next appointment:   6 month(s)  Provider:   Redell Leiter, MD    We recommend signing up for the patient portal called MyChart.  Sign up information is provided on this After Visit Summary.  MyChart is used to connect with patients for Virtual Visits (Telemedicine).  Patients are able to view lab/test results, encounter notes, upcoming appointments, etc.  Non-urgent messages can be sent to your provider as well.   To learn more about what you can do with MyChart, go to [email or web address].   Other Instructions Check AM blood pressure both sitting and standing for 2 weeks and send by MyChart or mail.                          Name and DOB__________________________ Dr. Leiter [address] [place], [place] [address]  Blood Pressure Record Sheet To take your blood pressure, you will need a blood  pressure machine. You can buy a blood pressure machine (blood pressure monitor) at your clinic, drug store, or online. When choosing one, consider: An automatic monitor that has an arm cuff. A cuff that wraps snugly around your upper arm. You should be able to fit only one finger between your arm and the cuff. A device that stores blood pressure reading results. Do not choose a monitor that measures your blood pressure from your wrist or finger. Follow your health care provider's instructions for how to take your blood pressure. To use this form: Get one reading in the morning (a.m.) 1-2 hours after you take any medicines. Get one reading in the evening (p.m.) before supper.   Blood pressure log Date: _______________________  a.m. _____________________(1st reading) HR___________            p.m. _____________________(2nd reading) HR__________  Date: _______________________  a.m. _____________________(1st reading) HR___________            p.m. _____________________(2nd reading) HR__________  Date: _______________________  a.m. _____________________(1st reading) HR___________            p.m. _____________________(2nd reading) HR__________  Date: _______________________  a.m. _____________________(1st reading) HR___________            p.m. _____________________(2nd reading) HR__________  Date: _______________________  a.m. _____________________(1st reading) HR___________            p.m. _____________________(2nd reading) HR__________  Date: _______________________  a.m. _____________________(1st reading) HR___________            p.m. _____________________(2nd reading) HR__________  Date: _______________________  a.m. _____________________(1st reading) HR___________            p.m. _____________________(2nd reading) HR__________   This information is not intended to replace advice given to you by your health care provider. Make sure you discuss any questions you have  with your health care provider. Document Revised: 12/02/2019 Document Reviewed: 12/02/2019 Elsevier Patient Education  2021 [employer].

## 2024-06-03 DIAGNOSIS — Z952 Presence of prosthetic heart valve: Secondary | ICD-10-CM | POA: Diagnosis not present

## 2024-06-03 DIAGNOSIS — Z951 Presence of aortocoronary bypass graft: Secondary | ICD-10-CM | POA: Diagnosis not present

## 2024-06-03 DIAGNOSIS — I951 Orthostatic hypotension: Secondary | ICD-10-CM | POA: Diagnosis not present

## 2024-06-04 ENCOUNTER — Other Ambulatory Visit: Payer: Self-pay

## 2024-06-04 ENCOUNTER — Ambulatory Visit: Payer: Self-pay | Admitting: Cardiology

## 2024-06-04 DIAGNOSIS — E2740 Unspecified adrenocortical insufficiency: Secondary | ICD-10-CM

## 2024-06-04 LAB — CORTISOL: Cortisol: 5.4 ug/dL — ABNORMAL LOW (ref 6.2–19.4)

## 2024-06-10 ENCOUNTER — Ambulatory Visit (INDEPENDENT_AMBULATORY_CARE_PROVIDER_SITE_OTHER): Admitting: Internal Medicine

## 2024-06-10 ENCOUNTER — Other Ambulatory Visit

## 2024-06-10 ENCOUNTER — Encounter: Payer: Self-pay | Admitting: Internal Medicine

## 2024-06-10 VITALS — BP 124/80 | HR 82 | Ht 67.0 in | Wt 142.2 lb

## 2024-06-10 DIAGNOSIS — I951 Orthostatic hypotension: Secondary | ICD-10-CM

## 2024-06-10 DIAGNOSIS — R7989 Other specified abnormal findings of blood chemistry: Secondary | ICD-10-CM | POA: Diagnosis not present

## 2024-06-10 MED ORDER — COSYNTROPIN 0.25 MG IJ SOLR
0.2500 mg | Freq: Once | INTRAMUSCULAR | Status: AC
  Administered 2024-06-10: 0.25 mg via INTRAMUSCULAR

## 2024-06-10 NOTE — Patient Instructions (Signed)
 Please discuss with urology if there is an option to switch Flomax , as that can exacerbate dizziness Please continue to follow-up with your primary care regarding anemia, as this is another contributing factor to dizziness

## 2024-06-10 NOTE — Progress Notes (Signed)
 Name: Bruce Nunez  MRN/ DOB: [id number], [birth year]    Age/ Sex: 83 y.o., male    PCP: [name], PA   Reason for Endocrinology Evaluation: Low serum cortisol     Date of Initial Endocrinology Evaluation: 06/10/2024     HPI: [name] is a 83 y.o. male with a past medical history of Hx of CVA, DM, Orthostatic hypotension, Hx of prostate  . The patient presented for initial endocrinology clinic visit on 06/10/2024 for consultative assistance with his low serum cortisol.   Patient was referred here by cardiology for further evaluation of serum cortisol   Patient is s/p CABG and AVR in 03/2024  and has been noted with difficult to control orthostatic hypotension which prompted a cortisol check up  Per cardiology office note, the patient was on midodrine  up in 08/2023 until his hospitalization when he was noted with elevated BP Midodrine  has helped with dizziness and falls   He does have chronic lightheadedness  Of note, the patient has been noted with acute infarcts in the left temporal occipital junction on MRI   In reviewing his medication history, the patient did receive dexamethasone  intraoperatively August, 2025  Pt with anemia   Today he is accompanied by his wife  No prior hx of low cortisol  No intra-articular injection  No neuropathy   He continues with dizziness especially with bending  over , this is better this week  No nausea with dizziness but has  SOB  No recent chest pain but continues with pressure  Rare headaches recently   Hx of prostate ca, had seeding placed. On Flomax  . [facility] Dr. 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   Complication of anesthesia     ether made me sick - woke up during colonoscopy   Diabetes mellitus without complication (HCC)    Endotracheally intubated 07/05/2023   Essential hypertension 03/12/2017   Exertional dyspnea 05/28/2024   Frequency of urination    GERD (gastroesophageal reflux disease)    occasional   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   Heart murmur    Hemorrhoids    History of kidney cancer    History of kidney stones    Hx of colon cancer, stage I    Hx of nonmelanoma skin cancer    Hx of peptic ulcer    as teenager   Hx of transfusion    Hyperlipidemia    Hypertension    Inguinal hernia    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   Muscle atrophy 07/20/2023   Muscle weakness 07/20/2023   Orthostatic hypotension 01/06/2024   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   PAF (paroxysmal atrial fibrillation) (HCC) 08/20/2023   PONV (postoperative nausea and vomiting)    only when had Ether as Anesthesia drug   Preop cardiovascular exam 03/09/2024   Prostate cancer (HCC) 08/15/2012   also Hx colon cancer / Hx skin cancer   Right inguinal hernia 05/08/2016   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   S/P right inguinal hernia repair Sept 2015 05/17/2014   Recurrent right indirect  Hernia treated with robotic TAPP repair with enterolysis Sept 2017   Stroke [facility]) 03/28/2022   Symptomatic carotid artery stenosis, left 04/10/2024   Syncope 08/17/2023   Past Surgical History:  Past Surgical History:  Procedure Laterality Date   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   Procedure: AORTIC VALVE REPLACEMENT WITH INSPIRIS AORTIC VALVE;  Surgeon: Maryjane Mt, MD;  Location: [facility] OR;  Service: Open Heart Surgery;  Laterality: N/A;   cataract surgery  2009   CORONARY ARTERY BYPASS GRAFT N/A 07/05/2023   Procedure:  CORONARY ARTERY BYPASS GRAFTING, USING LEFT INTERNAL MAMMARY ARTERY;  Surgeon: Maryjane Mt, MD;  Location: [facility] OR;  Service: Open Heart Surgery;  Laterality: N/A;   CYSTOSCOPY     ENDARTERECTOMY Left 04/10/2024   Procedure: LEFT CAROTID ENDARTERECTOMY;  Surgeon: Lanis Fonda BRAVO, MD;  Location[facility] OR;  Service: Vascular;  Laterality: Left;   INGUINAL HERNIA REPAIR Right 05/17/2014   Procedure: RIGHT INGUINAL HERNIA REPAIR ;  Surgeon: Adina Lunger, MD;  Location: [facility] ORS;  Service: General;  Laterality: Right;  With MESH   KNEE SURGERY Right 2012   LEFT HEART CATH AND CORONARY ANGIOGRAPHY N/A 05/03/2023   Procedure: LEFT HEART CATH AND CORONARY ANGIOGRAPHY;  Surgeon: Wonda Sharper, MD;  Location: [facility] INVASIVE CV LAB;  Service: Cardiovascular;  Laterality: N/A;   PACEMAKER IMPLANT N/A 07/17/2023   Procedure: PACEMAKER IMPLANT;  Surgeon: Inocencio Soyla Lunger, MD;  Location: [facility] INVASIVE CV LAB;  Service: Cardiovascular;  Laterality: N/A;   PARTIAL COLECTOMY  2005   PATCH ANGIOPLASTY Left 04/10/2024   Procedure: PATCH ANGIOPLASTY, USING GEORGE BIOLOGIC PATCH;  Surgeon: Lanis Fonda BRAVO, MD;  Location: [facility] OR;  Service: Vascular;  Laterality: Left;   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   RADIOACTIVE SEED IMPLANT N/A 11/24/2012   Procedure: RADIOACTIVE SEED IMPLANT;  Surgeon: Alm GORMAN Fragmin, MD;  Location: [facility];  Service: Urology;  Laterality: N/A;   71seeds implanted  no seeds found in bladder   TEE WITHOUT CARDIOVERSION N/A 07/05/2023   Procedure: TRANSESOPHAGEAL ECHOCARDIOGRAM;  Surgeon: Maryjane Mt, MD;  Location: [facility] OR;  Service: Open Heart Surgery;  Laterality: N/A;   THROAT SURGERY  1940's   as child (age 29)growth that created fissure/corrective surgery   TONSILLECTOMY     age 14   XI ROBOTIC ASSISTED INGUINAL HERNIA REPAIR WITH MESH Right 05/08/2016   Procedure: XI ROBOTIC ASSISTED REPAIR OF RECURRENT RIGHT INGUINAL HERNIA;  Surgeon: Donnice Lunger, MD;  Location:  [facility] ORS;  Service: General;  Laterality: Right;  With MESH    Social History:  reports that he has never smoked. He has never been exposed to tobacco smoke. He has never used smokeless tobacco. He reports that he does not drink alcohol and does not use drugs. Family History: family history includes Brain cancer in an other family member; Coronary artery disease in an other family member; Heart attack in an other family member; Prostate cancer in an other family member.   HOME MEDICATIONS: Allergies as of 06/10/2024       Reactions   Penicillins Anaphylaxis, Shortness Of Breath, Itching, Swelling   Throat swells up but no intubation per wife   Iodinated Contrast Media Hives, Rash   Dorethia Mounts ] Other (See Comments)   Hx of GI bleeds   Metformin And Related Diarrhea  Monodox [doxycycline Hyclate] Nausea Only   Sulfa Antibiotics Itching, Swelling   Ultram [tramadol] Nausea And Vomiting        Medication List        Accurate as of June 10, 2024 11:04 AM. If you have any questions, ask your nurse or doctor.          acetaminophen  325 MG tablet Commonly known as: TYLENOL  Take 1-2 tablets (325-650 mg total) by mouth every 6 (six) hours as needed for mild pain (pain score 1-3).   albuterol  108 (90 Base) MCG/ACT inhaler Commonly known as: VENTOLIN  HFA Inhale 2 puffs into the lungs every 6 (six) hours as needed for shortness of breath.   apixaban  5 MG Tabs tablet Commonly known as: ELIQUIS  Take 1 tablet (5 mg total) by mouth 2 (two) times daily.   budesonide -formoterol  160-4.5 MCG/ACT inhaler Commonly known as: SYMBICORT Inhale 2 puffs into the lungs 2 (two) times daily.   cyanocobalamin  1000 MCG/ML injection Commonly known as: VITAMIN B12 Inject 1,000 mcg into the muscle every 30 (thirty) days.   empagliflozin  10 MG Tabs tablet Commonly known as: JARDIANCE  Take 1 tablet (10 mg total) by mouth daily.   fexofenadine 180 MG tablet Commonly known as: ALLEGRA Take 180  mg by mouth daily.   fluticasone  50 MCG/ACT nasal spray Commonly known as: FLONASE  Place 2 sprays into both nostrils 2 (two) times daily.   isosorbide  mononitrate 30 MG 24 hr tablet Commonly known as: IMDUR  Take 1 tablet (30 mg total) by mouth daily.   levothyroxine  75 MCG tablet Commonly known as: SYNTHROID  Take 75 mcg by mouth daily before breakfast.   metoprolol  succinate 25 MG 24 hr tablet Commonly known as: TOPROL -XL Take 1 tablet (25 mg total) by mouth daily. Take with or immediately following a meal.   pantoprazole  20 MG tablet Commonly known as: PROTONIX  Take 20 mg by mouth daily.   rosuvastatin  20 MG tablet Commonly known as: CRESTOR  Take 1 tablet (20 mg total) by mouth daily.   solifenacin 10 MG tablet Commonly known as: VESICARE Take 10 mg by mouth daily.   tamsulosin  0.4 MG Caps capsule Commonly known as: FLOMAX  Take 1 capsule (0.4 mg total) by mouth at bedtime.          REVIEW OF SYSTEMS: A comprehensive ROS was conducted with the patient and is negative except as per HPI     OBJECTIVE:  VS: BP 124/80 (BP Location: Left Arm, Patient Position: Sitting, Cuff Size: Normal)   Pulse 82   Ht 5' 7 (1.702 m)   Wt 142 lb 3.2 oz (64.5 kg)   SpO2 98%   BMI 22.27 kg/m    Wt Readings from Last 3 Encounters:  06/10/24 142 lb 3.2 oz (64.5 kg)  06/02/24 142 lb (64.4 kg)  05/30/24 135 lb 6.4 oz (61.4 kg)   Body surface area is 1.75 meters squared.   EXAM: General: Pt appears well and is in NAD  Eyes: External eye exam normal without stare, lid lag or exophthalmos.  EOM intact.  PERRL.  Neck: General: Supple without adenopathy. Thyroid : Thyroid  size normal.  No goiter or nodules appreciated. No thyroid  bruit.  Lungs: Clear with good BS bilat   Heart: Auscultation: RRR.  Abdomen: Soft, nontender  Extremities:  BL LE: No pretibial edema   Mental Status: Judgment, insight: Intact Orientation: Oriented to time, place, and person Mood and affect: No  depression, anxiety, or agitation     DATA REVIEWED:   Latest Reference  Range & Units 06/10/24 11:37  Specimen 2 mcg/dL 89.2  Specimen 3 mcg/dL 86.3      Latest Reference Range & Units 06/03/24 08:26  Cortisol 6.2 - 19.4 ug/dL 5.4 (L)    Old records , labs and images have been reviewed.    ASSESSMENT/PLAN/RECOMMENDATIONS:   Low serum cortisol  -Patient was noted with a low serum cortisol at 5.4 UG/DL on 89/08/7972 - In reviewing his medication list, it appears that the patient received dexamethasone  during his surgery in August, which could be the reason for his low cortisol - Cosyntropin stimulation test shows a baseline serum cortisol of 2.9, with a 60-minute cortisol level of 13.6.  Of note, this was done in the afternoon - I did discuss these results with Mr. Markson on 06/12/2024, 60-minute serum cortisol is not at goal but it is certainly much improved from baseline, I have recommended waiting for ACTH at this time which should be back next week.  If his ACTH is normal I will not start him on hydrocortisone but we would repeat levels.  If his ACTH is undetectable, I will start him on hydrocortisone    2.  Orthostatic hypotension:   - Multifactorial.  Differential diagnosis includes anemia, tamsulosin  use (of note, he has been using this in the morning and just recently switched it to bedtime which may be a contributing factor to improving his dizziness recently), autonomic neuropathy      Signed electronically by: Stefano Redgie Butts, MD  [facility] Endocrinology  [facility] [address] [place] [place], [place] [address] Phone: [phone] FAX: [phone]   CC: [name], PA [address] [place] [address] Phone: [phone] Fax: [phone]   Return to Endocrinology clinic as below: Future Appointments  Date Time Provider Department Center  06/10/2024 11:10 AM Rakia Frayne, Donell Redgie, MD [facility] None   07/17/2024  7:00 AM [facility] HVT DEVICE REMOTES [facility] H&V  10/16/2024  7:00 AM [facility] HVT DEVICE REMOTES [facility] H&V  01/15/2025  7:00 AM [facility] HVT DEVICE REMOTES [facility] H&V  01/25/2025 11:15 AM Whitfield Raisin, NP [facility] None  04/16/2025  7:00 AM [facility] HVT DEVICE REMOTES [facility] H&V  07/16/2025  7:00 AM CVD HVT DEVICE REMOTES [facility] H&V

## 2024-06-10 NOTE — Progress Notes (Signed)
After obtaining consent, and per orders of Dr. Kelton Pillar, injection of Cosyntropin 38m  given by Joline Encalada L Jamine Highfill in right arm. Patient instructed to remain in clinic for 20 minutes afterwards, and to report any adverse reaction to me immediately.

## 2024-06-16 LAB — CORTISOL, 3 SPECIMEN
SPECIMEN 2: 10.7 ug/dL
SPECIMEN 3: 13.6 ug/dL
Specimen: 2.9 ug/dL
TIME 1: 1140
TIME 3: 1241
Time 2: 1211

## 2024-06-16 LAB — ACTH: C206 ACTH: 10 pg/mL (ref 6–50)

## 2024-06-17 ENCOUNTER — Ambulatory Visit: Payer: Self-pay | Admitting: Internal Medicine

## 2024-06-17 NOTE — Telephone Encounter (Signed)
 Can you please let the patient know that the test I was waiting on (ACTH) is normal   Since his symptoms have been improving I would suggest we repeat the cosyntropin stimulation test the first week of December as long as he does not get any more prednisone  or steroids  Please schedule him for a nurse visit to repeat cosyntropin in first week of December   Thanks

## 2024-06-22 ENCOUNTER — Other Ambulatory Visit: Payer: Self-pay | Admitting: Cardiology

## 2024-07-02 ENCOUNTER — Encounter: Payer: Self-pay | Admitting: Cardiology

## 2024-07-06 ENCOUNTER — Ambulatory Visit: Payer: Medicare Other | Admitting: Neurology

## 2024-07-09 DIAGNOSIS — I351 Nonrheumatic aortic (valve) insufficiency: Secondary | ICD-10-CM | POA: Diagnosis not present

## 2024-07-09 DIAGNOSIS — I2510 Atherosclerotic heart disease of native coronary artery without angina pectoris: Secondary | ICD-10-CM | POA: Diagnosis not present

## 2024-07-09 DIAGNOSIS — Z23 Encounter for immunization: Secondary | ICD-10-CM | POA: Diagnosis not present

## 2024-07-09 DIAGNOSIS — E1169 Type 2 diabetes mellitus with other specified complication: Secondary | ICD-10-CM | POA: Diagnosis not present

## 2024-07-09 DIAGNOSIS — E1139 Type 2 diabetes mellitus with other diabetic ophthalmic complication: Secondary | ICD-10-CM | POA: Diagnosis not present

## 2024-07-09 DIAGNOSIS — I209 Angina pectoris, unspecified: Secondary | ICD-10-CM | POA: Diagnosis not present

## 2024-07-09 DIAGNOSIS — I119 Hypertensive heart disease without heart failure: Secondary | ICD-10-CM | POA: Diagnosis not present

## 2024-07-09 DIAGNOSIS — E034 Atrophy of thyroid (acquired): Secondary | ICD-10-CM | POA: Diagnosis not present

## 2024-07-09 DIAGNOSIS — Z951 Presence of aortocoronary bypass graft: Secondary | ICD-10-CM | POA: Diagnosis not present

## 2024-07-09 DIAGNOSIS — E782 Mixed hyperlipidemia: Secondary | ICD-10-CM | POA: Diagnosis not present

## 2024-07-16 ENCOUNTER — Other Ambulatory Visit: Payer: Self-pay

## 2024-07-17 ENCOUNTER — Ambulatory Visit: Payer: Medicare Other

## 2024-07-17 DIAGNOSIS — I480 Paroxysmal atrial fibrillation: Secondary | ICD-10-CM

## 2024-07-17 LAB — CUP PACEART REMOTE DEVICE CHECK
Battery Remaining Longevity: 120 mo
Battery Remaining Percentage: 94 %
Battery Voltage: 3.01 V
Brady Statistic AP VP Percent: 1 %
Brady Statistic AP VS Percent: 1 %
Brady Statistic AS VP Percent: 1 %
Brady Statistic AS VS Percent: 99 %
Brady Statistic RA Percent Paced: 1 %
Brady Statistic RV Percent Paced: 1 %
Date Time Interrogation Session: 20251121020017
Implantable Lead Connection Status: 753985
Implantable Lead Connection Status: 753985
Implantable Lead Implant Date: 20241120
Implantable Lead Implant Date: 20241120
Implantable Lead Location: 753859
Implantable Lead Location: 753860
Implantable Pulse Generator Implant Date: 20241120
Lead Channel Impedance Value: 380 Ohm
Lead Channel Impedance Value: 510 Ohm
Lead Channel Pacing Threshold Amplitude: 0.625 V
Lead Channel Pacing Threshold Amplitude: 1 V
Lead Channel Pacing Threshold Pulse Width: 0.5 ms
Lead Channel Pacing Threshold Pulse Width: 0.5 ms
Lead Channel Sensing Intrinsic Amplitude: 5 mV
Lead Channel Sensing Intrinsic Amplitude: 5.6 mV
Lead Channel Setting Pacing Amplitude: 0.875
Lead Channel Setting Pacing Amplitude: 2 V
Lead Channel Setting Pacing Pulse Width: 0.5 ms
Lead Channel Setting Sensing Sensitivity: 0.5 mV
Pulse Gen Model: 2272
Pulse Gen Serial Number: 8213495

## 2024-07-19 ENCOUNTER — Ambulatory Visit: Payer: Self-pay | Admitting: Cardiology

## 2024-07-20 NOTE — Progress Notes (Signed)
 Remote PPM Transmission

## 2024-07-28 ENCOUNTER — Ambulatory Visit (INDEPENDENT_AMBULATORY_CARE_PROVIDER_SITE_OTHER)

## 2024-07-28 ENCOUNTER — Other Ambulatory Visit

## 2024-07-28 DIAGNOSIS — R7989 Other specified abnormal findings of blood chemistry: Secondary | ICD-10-CM

## 2024-07-28 DIAGNOSIS — I951 Orthostatic hypotension: Secondary | ICD-10-CM | POA: Diagnosis not present

## 2024-07-28 MED ORDER — COSYNTROPIN 0.25 MG IJ SOLR
0.2500 mg | Freq: Once | INTRAMUSCULAR | Status: AC
  Administered 2024-07-28: 0.25 mg via INTRAVENOUS

## 2024-07-28 NOTE — Progress Notes (Signed)
 Patient seen today for study.  Blood drawn and Cortrosyn  injection given.  Patient tolerated well.

## 2024-07-30 LAB — CORTISOL
Cortisol, Plasma: 10.2 ug/dL
Cortisol, Plasma: 12.1 ug/dL

## 2024-07-31 LAB — ACTH: C206 ACTH: 11 pg/mL (ref 6–50)

## 2024-07-31 LAB — CORTISOL: Cortisol, Plasma: 2.8 ug/dL — ABNORMAL LOW

## 2024-08-04 MED ORDER — HYDROCORTISONE 5 MG PO TABS: 5.0000 mg | ORAL_TABLET | ORAL | 3 refills | Status: AC

## 2024-08-04 NOTE — Telephone Encounter (Signed)
 Can you please let the patient know that his cosyntropin  test continues to show that he has low cortisol, because of that information I will have to start him on hydrocortisone  as below.    Hydrocortisone  5 mg, 3 tablets with breakfast every morning and 1 tablet in the afternoon between 2-4 PM    Please schedule him for follow-up with me in 4 months      Please asked the patient to obtain a medical alert bracelet (he can get it online or through [employer]) that says adrenal insufficiency   If the patient gets extremely sick for example high fever he will need to double up on the hydrocortisone  for 2-3 days only, for example he will take 6 with breakfast in the morning and 2 in the afternoon   Thanks

## 2024-08-04 NOTE — Telephone Encounter (Signed)
 Patient aware of results and recommendations.

## 2024-10-26 ENCOUNTER — Ambulatory Visit: Admitting: Cardiology

## 2024-11-30 ENCOUNTER — Ambulatory Visit: Admitting: Cardiology

## 2024-12-03 ENCOUNTER — Ambulatory Visit: Admitting: Internal Medicine

## 2025-01-25 ENCOUNTER — Ambulatory Visit: Admitting: Adult Health
